# Patient Record
Sex: Female | Born: 1945
Health system: Southern US, Community
[De-identification: ages and names within clinical notes are randomized; demographics above are authoritative.]

## PROBLEM LIST (undated history)

## (undated) DIAGNOSIS — N281 Cyst of kidney, acquired: Secondary | ICD-10-CM

## (undated) DIAGNOSIS — I10 Essential (primary) hypertension: Secondary | ICD-10-CM

## (undated) DIAGNOSIS — E785 Hyperlipidemia, unspecified: Secondary | ICD-10-CM

## (undated) DIAGNOSIS — K5730 Diverticulosis of large intestine without perforation or abscess without bleeding: Secondary | ICD-10-CM

## (undated) DIAGNOSIS — D179 Benign lipomatous neoplasm, unspecified: Secondary | ICD-10-CM

## (undated) DIAGNOSIS — K219 Gastro-esophageal reflux disease without esophagitis: Secondary | ICD-10-CM

## (undated) DIAGNOSIS — A6000 Herpesviral infection of urogenital system, unspecified: Secondary | ICD-10-CM

## (undated) DIAGNOSIS — E039 Hypothyroidism, unspecified: Secondary | ICD-10-CM

## (undated) DIAGNOSIS — F411 Generalized anxiety disorder: Secondary | ICD-10-CM

## (undated) DIAGNOSIS — E119 Type 2 diabetes mellitus without complications: Secondary | ICD-10-CM

## (undated) DIAGNOSIS — J45909 Unspecified asthma, uncomplicated: Secondary | ICD-10-CM

## (undated) DIAGNOSIS — R609 Edema, unspecified: Secondary | ICD-10-CM

## (undated) DIAGNOSIS — D509 Iron deficiency anemia, unspecified: Secondary | ICD-10-CM

## (undated) HISTORY — DX: Edema, unspecified: R60.9

## (undated) HISTORY — DX: Gastro-esophageal reflux disease without esophagitis: K21.9

## (undated) HISTORY — PX: TUBAL LIGATION: SHX77

## (undated) HISTORY — DX: Herpesviral infection of urogenital system, unspecified: A60.00

## (undated) HISTORY — DX: Hyperlipidemia, unspecified: E78.5

## (undated) HISTORY — DX: Iron deficiency anemia, unspecified: D50.9

## (undated) HISTORY — DX: Essential (primary) hypertension: I10

## (undated) HISTORY — PX: SHOULDER SURGERY: SHX246

## (undated) HISTORY — PX: ABDOMINAL HYSTERECTOMY: SHX81

## (undated) HISTORY — PX: TONSILLECTOMY: SUR1361

## (undated) HISTORY — DX: Unspecified asthma, uncomplicated: J45.909

## (undated) HISTORY — DX: Type 2 diabetes mellitus without complications: E11.9

## (undated) HISTORY — DX: Diverticulosis of large intestine without perforation or abscess without bleeding: K57.30

## (undated) HISTORY — PX: OTHER SURGICAL HISTORY: SHX169

## (undated) HISTORY — DX: Generalized anxiety disorder: F41.1

## (undated) HISTORY — DX: Hypothyroidism, unspecified: E03.9

## (undated) HISTORY — DX: Cyst of kidney, acquired: N28.1

## (undated) HISTORY — DX: Benign lipomatous neoplasm, unspecified: D17.9

---

## 1999-08-28 ENCOUNTER — Ambulatory Visit (HOSPITAL_COMMUNITY): Admission: RE | Admit: 1999-08-28 | Discharge: 1999-08-28 | Payer: Self-pay | Admitting: Internal Medicine

## 2000-09-23 ENCOUNTER — Ambulatory Visit (HOSPITAL_COMMUNITY): Admission: RE | Admit: 2000-09-23 | Discharge: 2000-09-23 | Payer: Self-pay | Admitting: Family Medicine

## 2001-11-14 ENCOUNTER — Ambulatory Visit (HOSPITAL_COMMUNITY): Admission: RE | Admit: 2001-11-14 | Discharge: 2001-11-14 | Payer: Self-pay | Admitting: Family Medicine

## 2002-09-14 ENCOUNTER — Encounter: Payer: Self-pay | Admitting: Family Medicine

## 2002-09-14 ENCOUNTER — Ambulatory Visit (HOSPITAL_COMMUNITY): Admission: RE | Admit: 2002-09-14 | Discharge: 2002-09-14 | Payer: Self-pay | Admitting: Family Medicine

## 2002-12-02 ENCOUNTER — Emergency Department (HOSPITAL_COMMUNITY): Admission: EM | Admit: 2002-12-02 | Discharge: 2002-12-02 | Payer: Self-pay

## 2003-02-08 ENCOUNTER — Ambulatory Visit (HOSPITAL_COMMUNITY): Admission: RE | Admit: 2003-02-08 | Discharge: 2003-02-08 | Payer: Self-pay | Admitting: Internal Medicine

## 2003-11-02 ENCOUNTER — Ambulatory Visit (HOSPITAL_COMMUNITY): Admission: RE | Admit: 2003-11-02 | Discharge: 2003-11-02 | Payer: Self-pay | Admitting: Internal Medicine

## 2003-11-23 ENCOUNTER — Ambulatory Visit: Payer: Self-pay | Admitting: *Deleted

## 2003-12-28 ENCOUNTER — Ambulatory Visit: Payer: Self-pay | Admitting: Internal Medicine

## 2004-06-06 ENCOUNTER — Ambulatory Visit: Payer: Self-pay | Admitting: Nurse Practitioner

## 2004-06-12 ENCOUNTER — Ambulatory Visit: Payer: Self-pay | Admitting: Nurse Practitioner

## 2004-06-29 ENCOUNTER — Ambulatory Visit: Payer: Self-pay | Admitting: Nurse Practitioner

## 2004-07-11 ENCOUNTER — Ambulatory Visit: Payer: Self-pay | Admitting: Nurse Practitioner

## 2004-07-25 ENCOUNTER — Ambulatory Visit: Payer: Self-pay | Admitting: Nurse Practitioner

## 2004-08-08 ENCOUNTER — Ambulatory Visit: Payer: Self-pay | Admitting: Nurse Practitioner

## 2004-08-14 ENCOUNTER — Ambulatory Visit (HOSPITAL_COMMUNITY): Admission: RE | Admit: 2004-08-14 | Discharge: 2004-08-14 | Payer: Self-pay | Admitting: Internal Medicine

## 2004-09-21 ENCOUNTER — Encounter (INDEPENDENT_AMBULATORY_CARE_PROVIDER_SITE_OTHER): Payer: Self-pay | Admitting: *Deleted

## 2004-09-21 ENCOUNTER — Ambulatory Visit (HOSPITAL_COMMUNITY): Admission: RE | Admit: 2004-09-21 | Discharge: 2004-09-21 | Payer: Self-pay | Admitting: Internal Medicine

## 2004-11-27 ENCOUNTER — Ambulatory Visit: Payer: Self-pay | Admitting: Nurse Practitioner

## 2004-12-04 ENCOUNTER — Ambulatory Visit (HOSPITAL_COMMUNITY): Admission: RE | Admit: 2004-12-04 | Discharge: 2004-12-04 | Payer: Self-pay | Admitting: Internal Medicine

## 2004-12-11 ENCOUNTER — Ambulatory Visit: Payer: Self-pay | Admitting: Nurse Practitioner

## 2005-05-23 ENCOUNTER — Ambulatory Visit: Payer: Self-pay | Admitting: Nurse Practitioner

## 2005-06-05 ENCOUNTER — Emergency Department (HOSPITAL_COMMUNITY): Admission: EM | Admit: 2005-06-05 | Discharge: 2005-06-05 | Payer: Self-pay | Admitting: Family Medicine

## 2005-06-11 ENCOUNTER — Ambulatory Visit: Payer: Self-pay | Admitting: Nurse Practitioner

## 2005-08-08 ENCOUNTER — Ambulatory Visit: Payer: Self-pay | Admitting: Nurse Practitioner

## 2005-09-19 ENCOUNTER — Ambulatory Visit (HOSPITAL_COMMUNITY): Admission: RE | Admit: 2005-09-19 | Discharge: 2005-09-19 | Payer: Self-pay | Admitting: Nurse Practitioner

## 2005-09-19 ENCOUNTER — Ambulatory Visit: Payer: Self-pay | Admitting: Nurse Practitioner

## 2005-10-23 ENCOUNTER — Ambulatory Visit: Payer: Self-pay | Admitting: Nurse Practitioner

## 2006-03-19 ENCOUNTER — Emergency Department (HOSPITAL_COMMUNITY): Admission: EM | Admit: 2006-03-19 | Discharge: 2006-03-19 | Payer: Self-pay | Admitting: Emergency Medicine

## 2006-03-21 ENCOUNTER — Inpatient Hospital Stay (HOSPITAL_COMMUNITY): Admission: RE | Admit: 2006-03-21 | Discharge: 2006-03-23 | Payer: Self-pay | Admitting: Orthopedic Surgery

## 2006-04-17 ENCOUNTER — Encounter: Admission: RE | Admit: 2006-04-17 | Discharge: 2006-04-17 | Payer: Self-pay | Admitting: Orthopedic Surgery

## 2006-04-30 ENCOUNTER — Ambulatory Visit (HOSPITAL_COMMUNITY): Admission: RE | Admit: 2006-04-30 | Discharge: 2006-04-30 | Payer: Self-pay | Admitting: Orthopedic Surgery

## 2006-05-27 ENCOUNTER — Encounter: Admission: RE | Admit: 2006-05-27 | Discharge: 2006-05-27 | Payer: Self-pay | Admitting: Orthopedic Surgery

## 2006-06-11 ENCOUNTER — Ambulatory Visit: Payer: Self-pay | Admitting: Nurse Practitioner

## 2006-07-03 ENCOUNTER — Encounter: Admission: RE | Admit: 2006-07-03 | Discharge: 2006-09-19 | Payer: Self-pay | Admitting: Orthopedic Surgery

## 2006-09-17 DIAGNOSIS — E119 Type 2 diabetes mellitus without complications: Secondary | ICD-10-CM

## 2006-09-17 DIAGNOSIS — E039 Hypothyroidism, unspecified: Secondary | ICD-10-CM | POA: Insufficient documentation

## 2006-09-17 DIAGNOSIS — I10 Essential (primary) hypertension: Secondary | ICD-10-CM

## 2006-09-17 HISTORY — DX: Essential (primary) hypertension: I10

## 2006-09-17 HISTORY — DX: Hypothyroidism, unspecified: E03.9

## 2006-09-17 HISTORY — DX: Type 2 diabetes mellitus without complications: E11.9

## 2006-09-18 ENCOUNTER — Encounter: Admission: RE | Admit: 2006-09-18 | Discharge: 2006-09-18 | Payer: Self-pay | Admitting: Orthopedic Surgery

## 2006-10-13 ENCOUNTER — Emergency Department (HOSPITAL_COMMUNITY): Admission: EM | Admit: 2006-10-13 | Discharge: 2006-10-13 | Payer: Self-pay | Admitting: Family Medicine

## 2006-11-14 ENCOUNTER — Encounter: Admission: RE | Admit: 2006-11-14 | Discharge: 2006-11-14 | Payer: Self-pay | Admitting: Orthopedic Surgery

## 2006-12-04 ENCOUNTER — Encounter: Admission: RE | Admit: 2006-12-04 | Discharge: 2006-12-04 | Payer: Self-pay | Admitting: Orthopedic Surgery

## 2006-12-12 ENCOUNTER — Emergency Department (HOSPITAL_COMMUNITY): Admission: EM | Admit: 2006-12-12 | Discharge: 2006-12-12 | Payer: Self-pay | Admitting: Emergency Medicine

## 2007-01-08 ENCOUNTER — Ambulatory Visit: Payer: Self-pay | Admitting: Nurse Practitioner

## 2007-01-08 LAB — CONVERTED CEMR LAB
ALT: 12 units/L (ref 0–35)
AST: 16 units/L (ref 0–37)
Albumin: 4.5 g/dL (ref 3.5–5.2)
Alkaline Phosphatase: 146 units/L — ABNORMAL HIGH (ref 39–117)
BUN: 18 mg/dL (ref 6–23)
Basophils Absolute: 0 10*3/uL (ref 0.0–0.1)
Basophils Relative: 0 % (ref 0–1)
CO2: 26 meq/L (ref 19–32)
Calcium: 9.5 mg/dL (ref 8.4–10.5)
Chloride: 101 meq/L (ref 96–112)
Cholesterol: 197 mg/dL (ref 0–200)
Creatinine, Ser: 0.76 mg/dL (ref 0.40–1.20)
Eosinophils Absolute: 0.2 10*3/uL (ref 0.2–0.7)
Eosinophils Relative: 4 % (ref 0–5)
Glucose, Bld: 65 mg/dL — ABNORMAL LOW (ref 70–99)
HCT: 41.2 % (ref 36.0–46.0)
HDL: 63 mg/dL (ref >39)
Hemoglobin: 13.2 g/dL (ref 12.0–15.0)
LDL Cholesterol: 110 mg/dL — ABNORMAL HIGH (ref 0–99)
Lymphocytes Relative: 46 % (ref 12–46)
Lymphs Abs: 3.2 10*3/uL (ref 0.7–4.0)
MCHC: 32 g/dL (ref 30.0–36.0)
MCV: 92.4 fL (ref 78.0–100.0)
Monocytes Absolute: 0.7 10*3/uL (ref 0.1–1.0)
Monocytes Relative: 10 % (ref 3–12)
Neutro Abs: 2.8 10*3/uL (ref 1.7–7.7)
Neutrophils Relative %: 41 % — ABNORMAL LOW (ref 43–77)
Platelets: 294 10*3/uL (ref 150–400)
Potassium: 3.6 meq/L (ref 3.5–5.3)
RBC: 4.46 M/uL (ref 3.87–5.11)
RDW: 14.1 % (ref 11.5–15.5)
Sodium: 140 meq/L (ref 135–145)
TSH: 1.779 microintl units/mL (ref 0.350–5.50)
Total Bilirubin: 0.5 mg/dL (ref 0.3–1.2)
Total CHOL/HDL Ratio: 3.1
Total Protein: 7.2 g/dL (ref 6.0–8.3)
Triglycerides: 120 mg/dL (ref <150)
VLDL: 24 mg/dL (ref 0–40)
WBC: 6.9 10*3/uL (ref 4.0–10.5)

## 2007-02-05 ENCOUNTER — Ambulatory Visit (HOSPITAL_COMMUNITY): Admission: RE | Admit: 2007-02-05 | Discharge: 2007-02-05 | Payer: Self-pay | Admitting: Orthopedic Surgery

## 2007-06-03 ENCOUNTER — Encounter (INDEPENDENT_AMBULATORY_CARE_PROVIDER_SITE_OTHER): Payer: Self-pay | Admitting: Nurse Practitioner

## 2007-06-03 ENCOUNTER — Ambulatory Visit: Payer: Self-pay | Admitting: Family Medicine

## 2007-06-03 LAB — CONVERTED CEMR LAB
ALT: 13 units/L (ref 0–35)
AST: 19 units/L (ref 0–37)
Albumin: 4.4 g/dL (ref 3.5–5.2)
Alkaline Phosphatase: 141 units/L — ABNORMAL HIGH (ref 39–117)
BUN: 15 mg/dL (ref 6–23)
Basophils Absolute: 0 10*3/uL (ref 0.0–0.1)
Basophils Relative: 1 % (ref 0–1)
CO2: 26 meq/L (ref 19–32)
Calcium: 9.5 mg/dL (ref 8.4–10.5)
Chloride: 103 meq/L (ref 96–112)
Cholesterol: 182 mg/dL (ref 0–200)
Creatinine, Ser: 0.84 mg/dL (ref 0.40–1.20)
Eosinophils Absolute: 0.2 10*3/uL (ref 0.0–0.7)
Eosinophils Relative: 3 % (ref 0–5)
Glucose, Bld: 114 mg/dL — ABNORMAL HIGH (ref 70–99)
HCT: 39.1 % (ref 36.0–46.0)
HCV Ab: NEGATIVE
HDL: 57 mg/dL (ref >39)
Hemoglobin: 13 g/dL (ref 12.0–15.0)
Hep A Total Ab: POSITIVE — AB
Hep B Core Total Ab: NEGATIVE
Hep B E Ab: NEGATIVE
Hep B S Ab: NEGATIVE
LDL Cholesterol: 109 mg/dL — ABNORMAL HIGH (ref 0–99)
Lymphocytes Relative: 42 % (ref 12–46)
Lymphs Abs: 2.5 10*3/uL (ref 0.7–4.0)
MCHC: 33.2 g/dL (ref 30.0–36.0)
MCV: 91.4 fL (ref 78.0–100.0)
Monocytes Absolute: 0.5 10*3/uL (ref 0.1–1.0)
Monocytes Relative: 8 % (ref 3–12)
Neutro Abs: 2.9 10*3/uL (ref 1.7–7.7)
Neutrophils Relative %: 47 % (ref 43–77)
Platelets: 301 10*3/uL (ref 150–400)
Potassium: 3.2 meq/L — ABNORMAL LOW (ref 3.5–5.3)
RBC: 4.28 M/uL (ref 3.87–5.11)
RDW: 14 % (ref 11.5–15.5)
Sodium: 144 meq/L (ref 135–145)
TSH: 1.335 microintl units/mL (ref 0.350–5.50)
Total Bilirubin: 0.4 mg/dL (ref 0.3–1.2)
Total CHOL/HDL Ratio: 3.2
Total Protein: 7.1 g/dL (ref 6.0–8.3)
Triglycerides: 82 mg/dL (ref <150)
VLDL: 16 mg/dL (ref 0–40)
WBC: 6.1 10*3/uL (ref 4.0–10.5)

## 2007-06-18 ENCOUNTER — Encounter (INDEPENDENT_AMBULATORY_CARE_PROVIDER_SITE_OTHER): Payer: Self-pay | Admitting: Nurse Practitioner

## 2007-06-18 ENCOUNTER — Ambulatory Visit: Payer: Self-pay | Admitting: Internal Medicine

## 2007-06-18 LAB — CONVERTED CEMR LAB: Potassium: 3.7 meq/L (ref 3.5–5.3)

## 2007-07-12 ENCOUNTER — Emergency Department (HOSPITAL_COMMUNITY): Admission: EM | Admit: 2007-07-12 | Discharge: 2007-07-12 | Payer: Self-pay | Admitting: Family Medicine

## 2007-12-24 ENCOUNTER — Emergency Department (HOSPITAL_COMMUNITY): Admission: EM | Admit: 2007-12-24 | Discharge: 2007-12-24 | Payer: Self-pay | Admitting: Family Medicine

## 2008-01-12 ENCOUNTER — Encounter (INDEPENDENT_AMBULATORY_CARE_PROVIDER_SITE_OTHER): Payer: Self-pay | Admitting: Internal Medicine

## 2008-01-12 ENCOUNTER — Ambulatory Visit: Payer: Self-pay | Admitting: Internal Medicine

## 2008-01-12 LAB — CONVERTED CEMR LAB
ALT: 17 units/L (ref 0–35)
AST: 18 units/L (ref 0–37)
Albumin: 4.5 g/dL (ref 3.5–5.2)
Alkaline Phosphatase: 137 units/L — ABNORMAL HIGH (ref 39–117)
BUN: 15 mg/dL (ref 6–23)
Basophils Absolute: 0 10*3/uL (ref 0.0–0.1)
Basophils Relative: 0 % (ref 0–1)
CO2: 27 meq/L (ref 19–32)
Calcium: 9 mg/dL (ref 8.4–10.5)
Chloride: 104 meq/L (ref 96–112)
Creatinine, Ser: 0.76 mg/dL (ref 0.40–1.20)
Eosinophils Absolute: 0.3 10*3/uL (ref 0.0–0.7)
Eosinophils Relative: 4 % (ref 0–5)
Glucose, Bld: 83 mg/dL (ref 70–99)
HCT: 39.1 % (ref 36.0–46.0)
Hemoglobin: 13 g/dL (ref 12.0–15.0)
Lymphocytes Relative: 49 % — ABNORMAL HIGH (ref 12–46)
Lymphs Abs: 3.4 10*3/uL (ref 0.7–4.0)
MCHC: 33.2 g/dL (ref 30.0–36.0)
MCV: 89.5 fL (ref 78.0–100.0)
Microalb, Ur: 0.48 mg/dL (ref 0.00–1.89)
Monocytes Absolute: 0.5 10*3/uL (ref 0.1–1.0)
Monocytes Relative: 7 % (ref 3–12)
Neutro Abs: 2.7 10*3/uL (ref 1.7–7.7)
Neutrophils Relative %: 39 % — ABNORMAL LOW (ref 43–77)
Platelets: 305 10*3/uL (ref 150–400)
Potassium: 3.7 meq/L (ref 3.5–5.3)
RBC: 4.37 M/uL (ref 3.87–5.11)
RDW: 14 % (ref 11.5–15.5)
Sodium: 143 meq/L (ref 135–145)
Total Bilirubin: 0.4 mg/dL (ref 0.3–1.2)
Total Protein: 7.2 g/dL (ref 6.0–8.3)
WBC: 6.9 10*3/uL (ref 4.0–10.5)

## 2008-01-13 ENCOUNTER — Ambulatory Visit: Payer: Self-pay | Admitting: Internal Medicine

## 2008-01-19 ENCOUNTER — Ambulatory Visit: Payer: Self-pay | Admitting: Internal Medicine

## 2008-04-07 ENCOUNTER — Encounter: Admission: RE | Admit: 2008-04-07 | Discharge: 2008-04-07 | Payer: Self-pay | Admitting: Orthopedic Surgery

## 2008-05-19 ENCOUNTER — Emergency Department (HOSPITAL_COMMUNITY): Admission: EM | Admit: 2008-05-19 | Discharge: 2008-05-19 | Payer: Self-pay | Admitting: Family Medicine

## 2008-05-31 ENCOUNTER — Encounter (INDEPENDENT_AMBULATORY_CARE_PROVIDER_SITE_OTHER): Payer: Self-pay | Admitting: Internal Medicine

## 2008-05-31 ENCOUNTER — Ambulatory Visit: Payer: Self-pay | Admitting: Internal Medicine

## 2008-05-31 LAB — CONVERTED CEMR LAB
BUN: 18 mg/dL (ref 6–23)
CO2: 28 meq/L (ref 19–32)
Calcium: 9.4 mg/dL (ref 8.4–10.5)
Chloride: 104 meq/L (ref 96–112)
Creatinine, Ser: 0.91 mg/dL (ref 0.40–1.20)
Glucose, Bld: 125 mg/dL — ABNORMAL HIGH (ref 70–99)
Potassium: 3.3 meq/L — ABNORMAL LOW (ref 3.5–5.3)
Sodium: 144 meq/L (ref 135–145)

## 2008-06-01 ENCOUNTER — Encounter (INDEPENDENT_AMBULATORY_CARE_PROVIDER_SITE_OTHER): Payer: Self-pay | Admitting: Internal Medicine

## 2008-06-03 ENCOUNTER — Ambulatory Visit (HOSPITAL_COMMUNITY): Admission: RE | Admit: 2008-06-03 | Discharge: 2008-06-03 | Payer: Self-pay | Admitting: Internal Medicine

## 2008-06-03 ENCOUNTER — Ambulatory Visit: Payer: Self-pay | Admitting: Internal Medicine

## 2008-06-03 LAB — HM MAMMOGRAPHY: HM Mammogram: NORMAL

## 2008-07-19 ENCOUNTER — Encounter: Admission: RE | Admit: 2008-07-19 | Discharge: 2008-07-19 | Payer: Self-pay | Admitting: Anesthesiology

## 2008-08-31 ENCOUNTER — Ambulatory Visit: Payer: Self-pay | Admitting: Internal Medicine

## 2008-09-02 ENCOUNTER — Emergency Department (HOSPITAL_COMMUNITY): Admission: EM | Admit: 2008-09-02 | Discharge: 2008-09-02 | Payer: Self-pay | Admitting: Emergency Medicine

## 2008-11-03 ENCOUNTER — Encounter: Payer: Self-pay | Admitting: Family Medicine

## 2008-11-03 ENCOUNTER — Ambulatory Visit: Payer: Self-pay | Admitting: Internal Medicine

## 2008-11-03 LAB — CONVERTED CEMR LAB
ALT: 21 units/L (ref 0–35)
AST: 23 units/L (ref 0–37)
Albumin: 4.1 g/dL (ref 3.5–5.2)
Alkaline Phosphatase: 123 units/L — ABNORMAL HIGH (ref 39–117)
BUN: 12 mg/dL (ref 6–23)
CO2: 22 meq/L (ref 19–32)
Calcium: 8.6 mg/dL (ref 8.4–10.5)
Chloride: 109 meq/L (ref 96–112)
Cholesterol: 193 mg/dL (ref 0–200)
Creatinine, Ser: 0.75 mg/dL (ref 0.40–1.20)
Glucose, Bld: 116 mg/dL — ABNORMAL HIGH (ref 70–99)
HDL: 50 mg/dL (ref >39)
LDL Cholesterol: 121 mg/dL — ABNORMAL HIGH (ref 0–99)
Potassium: 3.7 meq/L (ref 3.5–5.3)
Sodium: 144 meq/L (ref 135–145)
Total Bilirubin: 0.4 mg/dL (ref 0.3–1.2)
Total CHOL/HDL Ratio: 3.9
Total Protein: 6.7 g/dL (ref 6.0–8.3)
Triglycerides: 109 mg/dL (ref <150)
VLDL: 22 mg/dL (ref 0–40)

## 2008-11-25 ENCOUNTER — Emergency Department (HOSPITAL_COMMUNITY): Admission: EM | Admit: 2008-11-25 | Discharge: 2008-11-25 | Payer: Self-pay | Admitting: Emergency Medicine

## 2009-01-25 ENCOUNTER — Encounter (INDEPENDENT_AMBULATORY_CARE_PROVIDER_SITE_OTHER): Payer: Self-pay | Admitting: Internal Medicine

## 2009-01-25 ENCOUNTER — Ambulatory Visit: Payer: Self-pay | Admitting: Internal Medicine

## 2009-01-25 LAB — CONVERTED CEMR LAB
ALT: 16 units/L (ref 0–35)
AST: 16 units/L (ref 0–37)
Albumin: 4.2 g/dL (ref 3.5–5.2)
Alkaline Phosphatase: 123 units/L — ABNORMAL HIGH (ref 39–117)
Bilirubin, Direct: 0.1 mg/dL (ref 0.0–0.3)
Cholesterol: 188 mg/dL (ref 0–200)
HDL: 55 mg/dL (ref >39)
Indirect Bilirubin: 0.4 mg/dL (ref 0.0–0.9)
LDL Cholesterol: 118 mg/dL — ABNORMAL HIGH (ref 0–99)
Total Bilirubin: 0.5 mg/dL (ref 0.3–1.2)
Total CHOL/HDL Ratio: 3.4
Total Protein: 6.4 g/dL (ref 6.0–8.3)
Triglycerides: 76 mg/dL (ref <150)
VLDL: 15 mg/dL (ref 0–40)

## 2009-02-09 ENCOUNTER — Ambulatory Visit: Payer: Self-pay | Admitting: Internal Medicine

## 2009-02-09 ENCOUNTER — Encounter (INDEPENDENT_AMBULATORY_CARE_PROVIDER_SITE_OTHER): Payer: Self-pay | Admitting: Internal Medicine

## 2009-02-09 LAB — CONVERTED CEMR LAB: Hgb A1c MFr Bld: 6.6 % — ABNORMAL HIGH (ref 4.6–6.1)

## 2009-02-16 ENCOUNTER — Encounter (INDEPENDENT_AMBULATORY_CARE_PROVIDER_SITE_OTHER): Payer: Self-pay | Admitting: *Deleted

## 2009-02-26 HISTORY — PX: COLONOSCOPY: SHX174

## 2009-03-22 ENCOUNTER — Ambulatory Visit: Payer: Self-pay | Admitting: Internal Medicine

## 2009-03-22 LAB — CONVERTED CEMR LAB
ALT: 14 units/L (ref 0–35)
AST: 14 units/L (ref 0–37)
Albumin: 4.4 g/dL (ref 3.5–5.2)
Alkaline Phosphatase: 126 units/L — ABNORMAL HIGH (ref 39–117)
Bilirubin, Direct: 0.1 mg/dL (ref 0.0–0.3)
Cholesterol: 192 mg/dL (ref 0–200)
HDL: 63 mg/dL (ref >39)
Indirect Bilirubin: 0.4 mg/dL (ref 0.0–0.9)
LDL Cholesterol: 115 mg/dL — ABNORMAL HIGH (ref 0–99)
Total Bilirubin: 0.5 mg/dL (ref 0.3–1.2)
Total CHOL/HDL Ratio: 3
Total Protein: 6.8 g/dL (ref 6.0–8.3)
Triglycerides: 69 mg/dL (ref <150)
VLDL: 14 mg/dL (ref 0–40)

## 2009-05-20 ENCOUNTER — Emergency Department (HOSPITAL_COMMUNITY): Admission: EM | Admit: 2009-05-20 | Discharge: 2009-05-20 | Payer: Self-pay | Admitting: Family Medicine

## 2009-12-06 ENCOUNTER — Ambulatory Visit: Payer: Self-pay | Admitting: Internal Medicine

## 2009-12-06 DIAGNOSIS — H109 Unspecified conjunctivitis: Secondary | ICD-10-CM | POA: Insufficient documentation

## 2009-12-06 DIAGNOSIS — D509 Iron deficiency anemia, unspecified: Secondary | ICD-10-CM

## 2009-12-06 DIAGNOSIS — K5730 Diverticulosis of large intestine without perforation or abscess without bleeding: Secondary | ICD-10-CM

## 2009-12-06 DIAGNOSIS — D179 Benign lipomatous neoplasm, unspecified: Secondary | ICD-10-CM

## 2009-12-06 HISTORY — DX: Diverticulosis of large intestine without perforation or abscess without bleeding: K57.30

## 2009-12-06 HISTORY — DX: Unspecified conjunctivitis: H10.9

## 2009-12-06 HISTORY — DX: Iron deficiency anemia, unspecified: D50.9

## 2009-12-06 HISTORY — DX: Benign lipomatous neoplasm, unspecified: D17.9

## 2009-12-07 LAB — CONVERTED CEMR LAB
ALT: 23 units/L (ref 0–35)
AST: 20 units/L (ref 0–37)
Albumin: 4 g/dL (ref 3.5–5.2)
Alkaline Phosphatase: 135 units/L — ABNORMAL HIGH (ref 39–117)
BUN: 12 mg/dL (ref 6–23)
Basophils Absolute: 0 10*3/uL (ref 0.0–0.1)
Basophils Relative: 0.8 % (ref 0.0–3.0)
Bilirubin Urine: NEGATIVE
Bilirubin, Direct: 0.1 mg/dL (ref 0.0–0.3)
CO2: 30 meq/L (ref 19–32)
Calcium: 9.2 mg/dL (ref 8.4–10.5)
Chlamydia, Swab/Urine, PCR: NEGATIVE
Chloride: 106 meq/L (ref 96–112)
Cholesterol: 179 mg/dL (ref 0–200)
Creatinine, Ser: 0.7 mg/dL (ref 0.4–1.2)
Creatinine,U: 46.5 mg/dL
Eosinophils Absolute: 0.2 10*3/uL (ref 0.0–0.7)
Eosinophils Relative: 3.4 % (ref 0.0–5.0)
GC Probe Amp, Urine: NEGATIVE
GFR calc non Af Amer: 111.69 mL/min (ref >60)
Glucose, Bld: 107 mg/dL — ABNORMAL HIGH (ref 70–99)
HCT: 41.9 % (ref 36.0–46.0)
HDL: 47.2 mg/dL (ref >39.00)
Hemoglobin, Urine: NEGATIVE
Hemoglobin: 14.3 g/dL (ref 12.0–15.0)
Hgb A1c MFr Bld: 7.6 % — ABNORMAL HIGH (ref 4.6–6.5)
Ketones, ur: NEGATIVE mg/dL
LDL Cholesterol: 117 mg/dL — ABNORMAL HIGH (ref 0–99)
Leukocytes, UA: NEGATIVE
Lymphocytes Relative: 44.1 % (ref 12.0–46.0)
Lymphs Abs: 2.4 10*3/uL (ref 0.7–4.0)
MCHC: 34.1 g/dL (ref 30.0–36.0)
MCV: 90.2 fL (ref 78.0–100.0)
Microalb Creat Ratio: 2.8 mg/g (ref 0.0–30.0)
Microalb, Ur: 1.3 mg/dL (ref 0.0–1.9)
Monocytes Absolute: 0.3 10*3/uL (ref 0.1–1.0)
Monocytes Relative: 6.4 % (ref 3.0–12.0)
Neutro Abs: 2.4 10*3/uL (ref 1.4–7.7)
Neutrophils Relative %: 45.3 % (ref 43.0–77.0)
Nitrite: NEGATIVE
Platelets: 265 10*3/uL (ref 150.0–400.0)
Potassium: 4 meq/L (ref 3.5–5.1)
RBC: 4.65 M/uL (ref 3.87–5.11)
RDW: 14.5 % (ref 11.5–14.6)
Sodium: 142 meq/L (ref 135–145)
Specific Gravity, Urine: 1.015 (ref 1.000–1.030)
TSH: 1.2 microintl units/mL (ref 0.35–5.50)
Total Bilirubin: 0.7 mg/dL (ref 0.3–1.2)
Total CHOL/HDL Ratio: 4
Total Protein, Urine: NEGATIVE mg/dL
Total Protein: 6.8 g/dL (ref 6.0–8.3)
Triglycerides: 76 mg/dL (ref 0.0–149.0)
Urine Glucose: NEGATIVE mg/dL
Urobilinogen, UA: 0.2 (ref 0.0–1.0)
VLDL: 15.2 mg/dL (ref 0.0–40.0)
WBC: 5.4 10*3/uL (ref 4.5–10.5)
pH: 7.5 (ref 5.0–8.0)

## 2009-12-12 ENCOUNTER — Encounter (INDEPENDENT_AMBULATORY_CARE_PROVIDER_SITE_OTHER): Payer: Self-pay | Admitting: *Deleted

## 2009-12-20 ENCOUNTER — Encounter: Payer: Self-pay | Admitting: Internal Medicine

## 2009-12-29 ENCOUNTER — Encounter (INDEPENDENT_AMBULATORY_CARE_PROVIDER_SITE_OTHER): Payer: Self-pay | Admitting: *Deleted

## 2010-01-02 ENCOUNTER — Ambulatory Visit: Payer: Self-pay | Admitting: Gastroenterology

## 2010-01-02 ENCOUNTER — Encounter (INDEPENDENT_AMBULATORY_CARE_PROVIDER_SITE_OTHER): Payer: Self-pay | Admitting: *Deleted

## 2010-01-04 ENCOUNTER — Telehealth (INDEPENDENT_AMBULATORY_CARE_PROVIDER_SITE_OTHER): Payer: Self-pay | Admitting: *Deleted

## 2010-01-13 ENCOUNTER — Encounter: Payer: Self-pay | Admitting: Internal Medicine

## 2010-01-16 ENCOUNTER — Ambulatory Visit: Payer: Self-pay | Admitting: Gastroenterology

## 2010-01-16 LAB — HM COLONOSCOPY

## 2010-01-17 ENCOUNTER — Encounter: Payer: Self-pay | Admitting: Gastroenterology

## 2010-01-18 ENCOUNTER — Ambulatory Visit: Payer: Self-pay | Admitting: Internal Medicine

## 2010-02-28 ENCOUNTER — Telehealth: Payer: Self-pay | Admitting: Internal Medicine

## 2010-03-24 ENCOUNTER — Other Ambulatory Visit: Payer: Self-pay | Admitting: Internal Medicine

## 2010-03-24 ENCOUNTER — Ambulatory Visit
Admission: RE | Admit: 2010-03-24 | Discharge: 2010-03-24 | Payer: Self-pay | Source: Home / Self Care | Attending: Internal Medicine | Admitting: Internal Medicine

## 2010-03-24 DIAGNOSIS — E785 Hyperlipidemia, unspecified: Secondary | ICD-10-CM | POA: Insufficient documentation

## 2010-03-24 DIAGNOSIS — R609 Edema, unspecified: Secondary | ICD-10-CM | POA: Insufficient documentation

## 2010-03-24 DIAGNOSIS — A6000 Herpesviral infection of urogenital system, unspecified: Secondary | ICD-10-CM

## 2010-03-24 DIAGNOSIS — R351 Nocturia: Secondary | ICD-10-CM | POA: Insufficient documentation

## 2010-03-24 HISTORY — DX: Edema, unspecified: R60.9

## 2010-03-24 HISTORY — DX: Hyperlipidemia, unspecified: E78.5

## 2010-03-24 HISTORY — DX: Herpesviral infection of urogenital system, unspecified: A60.00

## 2010-03-24 LAB — URINALYSIS, ROUTINE W REFLEX MICROSCOPIC
Bilirubin Urine: NEGATIVE
Hemoglobin, Urine: NEGATIVE
Ketones, ur: NEGATIVE
Leukocytes, UA: NEGATIVE
Nitrite: NEGATIVE
Specific Gravity, Urine: 1.025 (ref 1.000–1.030)
Total Protein, Urine: NEGATIVE
Urine Glucose: NEGATIVE
Urobilinogen, UA: 0.2 (ref 0.0–1.0)
pH: 5.5 (ref 5.0–8.0)

## 2010-03-25 ENCOUNTER — Encounter: Payer: Self-pay | Admitting: Internal Medicine

## 2010-03-28 NOTE — Medication Information (Signed)
Summary: Cancalled/[facility]  Cancalled/[facility]   Imported By: Lester Kress 01/20/2010 10:04:52  _____________________________________________________________________  External Attachment:    Type:   Image     Comment:   External Document

## 2010-03-28 NOTE — Assessment & Plan Note (Signed)
Summary: NEW MEDICARE PT--PKG-#-STC   Vital Signs:  Patient profile:   65 year old female Height:      59 inches Weight:      193.25 pounds BMI:     39.17 O2 Sat:      98 % on Room air Temp:     98.3 degrees F oral Pulse rate:   84 / minute BP sitting:   142 / 90  (left arm) Cuff size:   large  Vitals Entered By: Zella Ball Ewing CMA Duncan Dull) (December 06, 2009 10:05 AM)  O2 Flow:  Room air  Preventive Care Screening  Mammogram:    Date:  06/03/2008    Results:  normal   CC: New Patient, New medicare/RE   CC:  New Patient and New medicare/RE.  History of Present Illness: overall doing well.  Pt denies CP, worsening sob, doe, wheezing, orthopnea, pnd, worsening LE edema, palps, dizziness or syncope  Pt denies new neuro symptoms such as headache, facial or extremity weakness  Pt denies polydipsia, polyuria  Overall good compliance with meds, trying to follow low chol, DM diet, wt stable, little excercise however No fever, wt loss, night sweats, loss of appetite or other constitutional symptoms   Preventive Screening-Counseling & Management  Alcohol-Tobacco     Smoking Status: never      Drug Use:  no.    Problems Prior to Update: 1)  Preventive Health Care  (ICD-V70.0) 2)  Anemia-iron Deficiency  (ICD-280.9) 3)  Conjunctivitis, Bilateral  (ICD-372.30) 4)  Lipoma  (ICD-214.9) 5)  Diverticulosis, Colon  (ICD-562.10) 6)  Sexually Transmitted Disease, Exposure To  (ICD-V01.6) 7)  Hypothyroidism  (ICD-244.9) 8)  Hypertension  (ICD-401.9) 9)  Diabetes Mellitus, Type II  (ICD-250.00)  Medications Prior to Update: 1)  Norvasc 5mg  .... One Po Once Daily 2)  Avapro 300mg  .... One By Mouth Once Daily 3)  Amlodipine 10mg  .... One By Mouth Once Daily 4)  Hctz 25mg  .... One By Mouth Qd 5)  Actos 15mg  .... One By Mouth Qd 6)  Pravastatin 40mg  .... One By Mouth Qd 7)  Omeprazole 40mg  .... One By Mouth Once Daily  Current Medications (verified): 1)  Norvasc 10 Mg Tabs (Amlodipine  Besylate) .Marland Kitchen.. 1po Once Daily   - Daw 2)  Avapro 300mg  .... One By Mouth Once Daily 3)  Hctz 25mg  .... One By Mouth Once Daily 4)  Actos 15mg  .... One By Mouth Once Daily 5)  Pravastatin 40mg  .... One By Mouth Once Daily 6)  Omeprazole 40mg  .... One By Mouth Once Daily 7)  Coq10 100 Mg Caps (Coenzyme Q10) .Marland Kitchen.. 1 By Mouth Once Daily 8)  Tylenol Extra Strength 500 Mg Tabs (Acetaminophen) .Marland Kitchen.. 1 By Mouth Once Daily As Needed 9)  Aleve 220 Mg Tabs (Naproxen Sodium) .Marland Kitchen.. 1 By Mouth Once Daily As Needed 10)  Vitamin C 500 Mg Tabs (Ascorbic Acid) .Marland Kitchen.. 1 By Mouth Once Daily 11)  Vitamin E 400 Unit Caps (Vitamin E) .Marland Kitchen.. 1 By Mouth Once Daily 12)  Vitamin B-12 1000 Mcg Tabs (Cyanocobalamin) .Marland Kitchen.. 1 By Mouth Once Daily 13)  Vitamin D 400 Unit Tabs (Cholecalciferol) .Marland Kitchen.. 1 By Mouth Once Daily 14)  Hydrocodone-Acetaminophen 7.5-750 Mg Tabs (Hydrocodone-Acetaminophen) .Marland Kitchen.. 1 By Mouth Once Daily As Needed For Pain 15)  Tobramycin Sulfate 0.3 % Soln (Tobramycin Sulfate) .... 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 DJD DM brother with prostate  cancer sister with heart disease, stroke, sudden death early 64's parent with HTN  Social History: Reviewed history and no changes required. 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         all otherwise negative per pt -  although is asking for STD check today as well, no genital symptoms  Physical Exam  General:  alert and well-developed.   Head:  normocephalic and atraumatic.   Eyes:  vision grossly intact, pupils equal, and pupils round., bilat conjucnt injected with mild d/c  Ears:  R ear normal and L ear normal.   Nose:  no external deformity and no nasal discharge.   Mouth:  no gingival abnormalities and pharynx pink and moist.   Neck:  supple and no masses.   Lungs:  normal respiratory effort and normal breath sounds.   Heart:  normal rate and regular rhythm.   Abdomen:  soft, non-tender, and normal bowel sounds.  ; RUQ area with nontender large mass approx 5 cm superficial and fatty consistenncy Msk:  no joint tenderness and no joint swelling.   Extremities:  no edema, no erythema  Neurologic:  cranial nerves II-XII intact and strength normal in all extremities.   Skin:  color normal and no rashes.   Psych:  not anxious appearing and not depressed appearing.     Impression & Recommendations:  Problem # 1:  Preventive Health Care (ICD-V70.0)  Overall doing well, age appropriate education and counseling updated and referral  for appropriate preventive services done unless declined, immunizations up to date or declined, diet counseling done if overweight, urged to quit smoking if smokes , most recent labs reviewed and current ordered if appropriate, ecg reviewed or declined (interpretation per ECG scanned in the EMR if done); information regarding Medicare Prevention requirements given if appropriate; speciality referrals updated as appropriate   Orders: Gastroenterology Referral (GI) TLB-BMP (Basic Metabolic Panel-BMET) (80048-METABOL) TLB-CBC Platelet - w/Differential (85025-CBCD) TLB-Hepatic/Liver Function Pnl (80076-HEPATIC) TLB-Lipid Panel (80061-LIPID) TLB-TSH (Thyroid Stimulating Hormone) (84443-TSH) TLB-Udip ONLY (81003-UDIP)  Problem # 2:  SEXUALLY TRANSMITTED DISEASE, EXPOSURE TO (ICD-V01.6)  for std check  per pt request  Orders: T-HIV-1 (Screen) [phone]) T-RPR (Syphilis) [phone]) T-Chlamydia & GC Probe, Urine (87491/[id number]) T-Herpes Simplex Type 2 (91478-29562)  Problem # 3:  LIPOMA (ICD-214.9)  RUQ  - enlarging  - to refer to gen surgury  Orders: Surgical Referral (Surgery)  Problem # 4:  CONJUNCTIVITIS, BILATERAL (ICD-372.30)  for tobramycin optho asd   Her updated medication list for this problem includes:    Tobramycin Sulfate 0.3 % Soln (Tobramycin sulfate) .Marland Kitchen... 2 gttts both eyes four times per day for 10 days  Problem # 5:  HYPERTENSION (ICD-401.9)  did not yet take meds this am - stable overall by hx and exam, ok to continue meds/tx as is   BP today: 142/90  Labs Reviewed: K+: 3.7 (11/03/2008) Creat: : 0.75 (11/03/2008)   Chol: 192 (03/22/2009)   HDL: 63 (03/22/2009)   LDL: 115 (03/22/2009)   TG: 69 (03/22/2009)  Her updated medication list for this problem includes:    Norvasc 10 Mg Tabs (Amlodipine besylate) .Marland Kitchen... 1po once daily   - daw  Problem # 6:  DIABETES MELLITUS, TYPE II (ICD-250.00)  Labs Reviewed: Creat: 0.75 (11/03/2008)    Reviewed HgBA1c  results: 6.6 (02/09/2009) stable overall by hx and exam, ok to continue meds/tx as is ,  Pt to cont DM diet, excercise, wt loss efforts; to check labs today   Orders: TLB-A1C / Hgb A1C (Glycohemoglobin) (83036-A1C) TLB-Microalbumin/Creat Ratio, Urine (82043-MALB)  Her updated medication list for this problem includes:    Actos 30 Mg Tabs (Pioglitazone hcl) .Marland Kitchen... 1po once daily  Complete Medication List: 1)  Norvasc 10 Mg Tabs (Amlodipine besylate) .Marland Kitchen.. 1po once daily   - daw 2)  Avapro 300mg   .... One by mouth once daily 3)  Hctz 25mg   .... One by mouth once daily 4)  Actos 30 Mg Tabs (Pioglitazone hcl) .Marland Kitchen.. 1po once daily 5)  Pravastatin 40mg   .... 2 by mouth once daily 6)  Omeprazole 40mg   .... One by mouth once daily 7)  Coq10 100 Mg Caps (Coenzyme q10) .Marland Kitchen.. 1 by mouth once daily 8)  Tylenol Extra Strength 500 Mg Tabs (Acetaminophen) .Marland Kitchen.. 1 by mouth once daily as needed 9)  Aleve 220 Mg Tabs (Naproxen sodium) .Marland Kitchen.. 1 by mouth once daily as needed 10)  Vitamin C 500 Mg Tabs (Ascorbic acid) .Marland Kitchen.. 1 by mouth once daily 11)  Vitamin E 400 Unit Caps (Vitamin e) .Marland Kitchen.. 1 by mouth once daily 12)  Vitamin B-12 1000 Mcg Tabs (Cyanocobalamin) .Marland Kitchen.. 1 by mouth once daily 13)  Vitamin D 400 Unit Tabs (Cholecalciferol) .Marland Kitchen.. 1 by mouth once daily 14)  Hydrocodone-acetaminophen 7.5-750 Mg Tabs (Hydrocodone-acetaminophen) .Marland Kitchen.. 1 by mouth once daily as needed for pain 15)  Tobramycin Sulfate 0.3 % Soln (Tobramycin sulfate) .... 2 gttts both eyes four times per day for 10 days  Other Orders: Flu Vaccine 31yrs + MEDICARE PATIENTS (Z6109) Administration Flu vaccine - MCR (G0008) Pneumococcal Vaccine (60454) Admin 1st Vaccine (09811)  Patient Instructions: 1)  you had the flu shot or pneumonia shot 2)  You will be contacted about the referral(s) to: colonoscopy, general surgeon 3)  please call for your yearly mammogram 4)  Please go to the Lab in the basement for your blood and/or urine tests today 5)   Please call the number on the [facility] Card for results of your testing 6)  Please take all new medications as prescribed 7)  Continue all previous medications as before this visit  8)  Please schedule a follow-up appointment in 6 months with: 9)  BMP prior to visit, ICD-9: 250.02 10)  Lipid Panel prior to visit, ICD-9: 11)  HbgA1C prior to visit, ICD-9: Prescriptions: HYDROCODONE-ACETAMINOPHEN 7.5-750 MG TABS (HYDROCODONE-ACETAMINOPHEN) 1 by mouth once daily as needed for pain  #30  x 5   Entered and Authorized by:   Corwin Levins MD   Signed by:   Corwin Levins MD on 12/06/2009   Method used:   Print then Give to Patient   RxID:   [id number] OMEPRAZOLE 40MG  one by mouth once daily  #90 x 3   Entered and Authorized by:   Corwin Levins MD   Signed by:   Corwin Levins MD on 12/06/2009   Method used:   Print then Give to Patient   RxID:   [id number] PRAVASTATIN 40MG  one by mouth once daily  #90 x 3   Entered and Authorized by:   Corwin Levins MD   Signed by:   Corwin Levins MD on 12/06/2009   Method used:   Print then Give to Patient   RxID:   [id number] ACTOS 15MG  one by mouth once daily  #90 x 3   Entered and Authorized by:   Corwin Levins MD   Signed by:   Corwin Levins MD on 12/06/2009   Method used:   Print then Give to Patient   RxID:   [id number] HCTZ 25MG  one by mouth once daily  #90 x 3   Entered and Authorized by:   Corwin Levins MD   Signed by:   Corwin Levins MD on 12/06/2009   Method used:   Print then Give to Patient   RxID:   [id number] AVAPRO 300MG  one by mouth once daily  #90 x 3   Entered and Authorized by:   Corwin Levins MD   Signed by:   Corwin Levins MD on 12/06/2009   Method used:   Print then Give to Patient   RxID:   [id number] NORVASC 10 MG TABS (AMLODIPINE BESYLATE) 1po once daily   - DAW Brand medically necessary #90 x 3   Entered and Authorized by:   Corwin Levins MD   Signed by:   Corwin Levins MD on 12/06/2009   Method used:    Print then Give to Patient   RxID:   [id number] TOBRAMYCIN SULFATE 0.3 % SOLN (TOBRAMYCIN SULFATE) 2 gttts both eyes four times per day for 10 days  #1 x 0   Entered and Authorized by:   Corwin Levins MD   Signed by:   Corwin Levins MD on 12/06/2009   Method used:   Print then Give to Patient   RxID:   [id number]   Flu Vaccine Consent Questions     Do you have a history of severe allergic reactions to this vaccine? no    Any prior history of allergic reactions to egg and/or gelatin? no    Do you have a sensitivity to the preservative Thimersol? no    Do you have a past history of Guillan-Barre Syndrome? no    Do you currently have an acute febrile illness? no    Have you ever had a severe reaction to latex? no    Vaccine information given and explained to patient? yes    Are you currently pregnant? no    Lot Number:[id number]   Exp Date:08/26/2010   Site Given  Left Deltoid IMflu1   Immunizations Administered:  Pneumonia Vaccine:    Vaccine Type: Pneumovax    Site: right deltoid    Mfr: Merck    Dose: 0.5 ml    Route: IM    Given by: Zella Ball Ewing CMA (AAMA)    Exp. Date:  05/11/2011    Lot #: [id number]    VIS given: 01/31/09 version given December 06, 2009.

## 2010-03-28 NOTE — Letter (Signed)
Summary: *HSN Results Follow up  [facility]  [address]   [place], [place] [address]   Phone: [phone]  Fax: [phone]      02/16/2009   [facility] [address] [place], [place]  [address]   Dear  Ms. Amberley Derderian,                            ____S.Drinkard,FNP   ____D. Gore,FNP       ____B. McPherson,MD   ____V. Rankins,MD    ____E. Mulberry,MD    ____N. Daphine Deutscher, FNP  ____D. Reche Dixon, MD    ____K. Philipp Deputy, MD    _X___Other Madaline Savage Id-Din, FNP     This letter is to inform you that your recent test(s):  _______Pap Smear    ___X____Lab Test     _______X-ray    ____X___ is within acceptable limits  _______ requires a medication change  _______ requires a follow-up lab visit  _______ requires a follow-up visit with your provider   Comments: Your hemoglobin A1C was at goal of 6.6. Continue yuor diabetic medications as prescribed. Keep up the good work.    _________________________________________________________ If you have any questions, please contact our office                     Sincerely,  Blondell Reveal RN [address]

## 2010-03-28 NOTE — Procedures (Signed)
Summary: Colonoscopy  Patient: Brandi Shaffer Note: All result statuses are Final unless otherwise noted.  Tests: (1) Colonoscopy (COL)   COL Colonoscopy           DONE     [facility]     [address]     [place], [place]  [address]           COLONOSCOPY PROCEDURE REPORT           PATIENT:  Brandi, Shaffer  MR#:  [id number]     BIRTHDATE:  [birth year], 64 yrs. old  GENDER:  female     ENDOSCOPIST:  Judie Petit T. Russella Dar, MD, [facility]     Referred by:  Oliver Barre, M.D.     PROCEDURE DATE:  01/16/2010     PROCEDURE:  Colonoscopy with biopsy     ASA CLASS:  Class II     INDICATIONS:  1) Routine Risk Screening     MEDICATIONS:   Fentanyl 75 mcg IV, Versed 6 mg IV     DESCRIPTION OF PROCEDURE:   After the risks benefits and     alternatives of the procedure were thoroughly explained, informed     consent was obtained.  Digital rectal exam was performed and     revealed no abnormalities.   The LB [id number] endoscope     was introduced through the anus and advanced to the cecum, which     was identified by both the appendix and ileocecal valve, without     limitations.  The quality of the prep was excellent, using     MoviPrep.  The instrument was then slowly withdrawn as the colon     was fully examined.     <<PROCEDUREIMAGES>>     FINDINGS:  Mild diverticulosis was found in the sigmoid to     transverse colon.  A sessile polyp was found in the ascending     colon. It was 3 mm in size. The polyp was removed using cold     biopsy forceps.  A sessile polyp was found in the descending     colon. It was 5 mm in size. The polyp was removed using cold     biopsy forceps. This was otherwise a normal examination of the     colon. Retroflexed views in the rectum revealed no abnormalities.     The time to cecum =  2.5  minutes. The scope was then withdrawn     (time =  9.5  min) from the patient and the procedure completed.           COMPLICATIONS:  None           ENDOSCOPIC  IMPRESSION:     1) Mild diverticulosis in the sigmoid to transverse colon     2) 3 mm sessile polyp in the ascending colon     3) 5 mm sessile polyp in the descending colon           RECOMMENDATIONS:     1) Await pathology results     2) High fiber diet with liberal fluid intake.     3) If the polyps removed are adenomatous (pre-cancerous),     colonoscopy in 5 years. Otherwise continue to follow colorectal     cancer screening guidelines for "routine risk" patients with     colonoscopy in 10 years.           Venita Lick. Russella Dar, MD, Clementeen Graham  n.     eSIGNED:   Malcolm T. Stark at 01/16/2010 11:45 AM           Jennye Boroughs, [id number]  Note: An exclamation mark (!) indicates a result that was not dispersed into the flowsheet. Document Creation Date: 01/16/2010 11:45 AM _______________________________________________________________________  (1) Order result status: Final Collection or observation date-time: 01/16/2010 11:41 Requested date-time:  Receipt date-time:  Reported date-time:  Referring Physician:   Ordering Physician: Claudette Head [phone]) Specimen Source:  Source: Launa Grill Order Number: [id number] Lab site:   Appended Document: Colonoscopy     Procedures Next Due Date:    Colonoscopy: 12/2019

## 2010-03-28 NOTE — Letter (Signed)
Summary: Diabetic Instructions  [facility]  [address] [place], [place] [address]   Phone: [phone]  Fax: [phone]    Brandi Shaffer July 29, 1945 MRN: [id number]   _ x _   ORAL DIABETIC MEDICATION INSTRUCTIONS  The day before your procedure:   Take your diabetic pill as you do normally  The day of your procedure:   Do not take your diabetic pill    We will check your blood sugar levels during the admission process and again in Recovery before discharging you home  _

## 2010-03-28 NOTE — Miscellaneous (Signed)
Summary: [facility] Previsit/prep  Clinical Lists Changes  Medications: Added new medication of MOVIPREP 100 GM  SOLR (PEG-KCL-NACL-NASULF-NA ASC-C) As per prep instructions. - Signed Rx of MOVIPREP 100 GM  SOLR (PEG-KCL-NACL-NASULF-NA ASC-C) As per prep instructions.;  #1 x 0;  Signed;  Entered by: Wyona Almas RN;  Authorized by: Meryl Dare MD [facility];  Method used: [facility] to [facility]  [address] [phone]*, [address] [place], [place], [place]  [address], Ph: [phone] or [phone], Fax: [phone] Observations: Added new observation of NKA: T (01/02/2010 12:44)    Prescriptions: MOVIPREP 100 GM  SOLR (PEG-KCL-NACL-NASULF-NA ASC-C) As per prep instructions.  #1 x 0   Entered by:   Wyona Almas RN   Authorized by:   Meryl Dare MD [facility]   Signed by:   Wyona Almas RN on 01/02/2010   Method used:   [facility]        [facility] [address] [phone]* (retail)       [address]       [place], [place]  [address]       Ph: [phone] or [phone]       Fax: [phone]   RxID:   [id number]

## 2010-03-28 NOTE — Progress Notes (Signed)
Summary: Supporting lab dx code.  ---- Converted from flag ---- ---- 01/03/2010 7:33 PM, Corwin Levins MD wrote: please try 236.3  ---- 12/28/2009 2:06 PM, Leodis Liverpool wrote: Need diagnosis codes for the Chlam, Rpr, HIV, GC, HSV from [facility] 12/06/09.  Loney Loh was denied payment for the V01.6.  Thanks Darl Pikes. ------------------------------

## 2010-03-28 NOTE — Consult Note (Signed)
Summary: [facility]   Imported By: Lennie Odor 01/12/2010 15:39:28  _____________________________________________________________________  External Attachment:    Type:   Image     Comment:   External Document

## 2010-03-28 NOTE — Letter (Signed)
Summary: Pre Visit Letter Revised  [facility]  [address] [place], [place] [address]   Phone: [phone]  Fax: [phone]        12/12/2009 MRN: [id number] [facility] [address] [place], [place]  [address]             Procedure Date:  01-16-10   Welcome to the Gastroenterology Division at [facility].    You are scheduled to see a nurse for your pre-procedure visit on 01-02-10 at 1:00p.m. on the 3rd floor at [facility], [address]  We ask that you try to arrive at our office 15 minutes prior to your appointment time to allow for check-in.  Please take a minute to review the attached form.  If you answer "Yes" to one or more of the questions on the first page, we ask that you call the person listed at your earliest opportunity.  If you answer "No" to all of the questions, please complete the rest of the form and bring it to your appointment.    Your nurse visit will consist of discussing your medical and surgical history, your immediate family medical history, and your medications.   If you are unable to list all of your medications on the form, please bring the medication bottles to your appointment and we will list them.  We will need to be aware of both prescribed and over the counter drugs.  We will need to know exact dosage information as well.    Please be prepared to read and sign documents such as consent forms, a financial agreement, and acknowledgement forms.  If necessary, and with your consent, a friend or relative is welcome to sit-in on the nurse visit with you.  Please bring your insurance card so that we may make a copy of it.  If your insurance requires a referral to see a specialist, please bring your referral form from your primary care physician.  No co-pay is required for this nurse visit.     If you cannot keep your appointment, please call [phone] to cancel or reschedule prior to your appointment date.  This allows [place]  the opportunity to schedule an appointment for another patient in need of care.    Thank you for choosing [facility] for your medical needs.  We appreciate the opportunity to care for you.  Please visit [place] at our website  to learn more about our practice.  Sincerely, The Gastroenterology Division

## 2010-03-28 NOTE — Letter (Signed)
Summary: Patient Notice-Hyperplastic Polyps  [facility]  [address] [place], [place] [address]   Phone: [phone]  Fax: [phone]        January 17, 2010 MRN: [id number]    [facility] [address] [place], [place]  [address]    Dear Ms. Givler,  I am pleased to inform you that the colon polyp(s) removed during your recent colonoscopy was (were) found to be hyperplastic. These types of polyps are NOT pre-cancerous.  It is my recommendation that you have a repeat colonoscopy examination in 10 years for routine colorectal cancer screening.  Should you develop new or worsening symptoms of abdominal pain, bowel habit changes or bleeding from the rectum or bowels, please schedule an evaluation with either your primary care physician or with me.  Continue treatment plan as outlined the day of your exam.  Please call us if you are having persistent problems or have questions about your condition that have not been fully answered at this time.  Sincerely,  Meryl Dare MD [facility]  This letter has been electronically signed by your physician.  Appended Document: Patient Notice-Hyperplastic Polyps Letter mailed

## 2010-03-28 NOTE — Letter (Signed)
Summary: [facility] Instructions  [facility]  [address] [place], [place] [address]   Phone: [phone]  Fax: [phone]       DARRIANNA BLONDER    09-23-1945    MRN: [id number]        Procedure Day /Date:  01/16/10 Monday     Arrival Time:  10:00am      Procedure Time:  11:00am     Location of Procedure:                    _x _  [facility] (4th Floor)                        PREPARATION FOR COLONOSCOPY WITH MOVIPREP   Starting 5 days prior to your procedure _ 11/16/11_ do not eat nuts, seeds, popcorn, corn, beans, peas,  salads, or any raw vegetables.  Do not take any fiber supplements (e.g. Metamucil, Citrucel, and Benefiber).  THE DAY BEFORE YOUR PROCEDURE         DATE:  01/15/10  DAY:  Sunday  1.  Drink clear liquids the entire day-NO SOLID FOOD  2.  Do not drink anything colored red or purple.  Avoid juices with pulp.  No orange juice.  3.  Drink at least 64 oz. (8 glasses) of fluid/clear liquids during the day to prevent dehydration and help the prep work efficiently.  CLEAR LIQUIDS INCLUDE: Water Jello Ice Popsicles Tea (sugar ok, no milk/cream) Powdered fruit flavored drinks Coffee (sugar ok, no milk/cream) Gatorade Juice: apple, white grape, white cranberry  Lemonade Clear bullion, consomm, broth Carbonated beverages (any kind) Strained chicken noodle soup Hard Candy                             4.  In the morning, mix first dose of MoviPrep solution:    Empty 1 Pouch A and 1 Pouch B into the disposable container    Add lukewarm drinking water to the top line of the container. Mix to dissolve    Refrigerate (mixed solution should be used within 24 hrs)  5.  Begin drinking the prep at 5:00 p.m. The MoviPrep container is divided by 4 marks.   Every 15 minutes drink the solution down to the next mark (approximately 8 oz) until the full liter is complete.   6.  Follow completed prep with 16 oz of clear liquid of your choice  (Nothing red or purple).  Continue to drink clear liquids until bedtime.  7.  Before going to bed, mix second dose of MoviPrep solution:    Empty 1 Pouch A and 1 Pouch B into the disposable container    Add lukewarm drinking water to the top line of the container. Mix to dissolve    Refrigerate  THE DAY OF YOUR PROCEDURE      DATE:   01/16/10  DAY:  Monday  Beginning at  6:00 a.m. (5 hours before procedure):         1. Every 15 minutes, drink the solution down to the next mark (approx 8 oz) until the full liter is complete.  2. Follow completed prep with 16 oz. of clear liquid of your choice.    3. You may drink clear liquids until 9:00am  (2 HOURS BEFORE PROCEDURE).   MEDICATION INSTRUCTIONS  Unless otherwise instructed, you should take regular prescription medications with a small sip of  water   as early as possible the morning of your procedure.  Diabetic patients - see separate instructions.   Additional medication instructions: Hold HCTZ the morning of procedure.         OTHER INSTRUCTIONS  You will need a responsible adult at least 66 years of age to accompany you and drive you home.   This person must remain in the waiting room during your procedure.  Wear loose fitting clothing that is easily removed.  Leave jewelry and other valuables at home.  However, you may wish to bring a book to read or  an iPod/MP3 player to listen to music as you wait for your procedure to start.  Remove all body piercing jewelry and leave at home.  Total time from sign-in until discharge is approximately 2-3 hours.  You should go home directly after your procedure and rest.  You can resume normal activities the  day after your procedure.  The day of your procedure you should not:   Drive   Make legal decisions   Operate machinery   Drink alcohol   Return to work  You will receive specific instructions about eating, activities and medications before you  leave.    The above instructions have been reviewed and explained to me by  Wyona Almas RN  January 02, 2010 1:24 PM     I fully understand and can verbalize these instructions _____________________________ Date _________

## 2010-03-30 NOTE — Assessment & Plan Note (Signed)
Summary: fatigue/?low potassium/#/cd   Vital Signs:  Patient profile:   65 year old female Height:      59 inches Weight:      190.25 pounds BMI:     38.56 O2 Sat:      96 % on Room air Temp:     99.6 degrees F oral Pulse rate:   92 / minute BP sitting:   140 / 76  (left arm) Cuff size:   regular  Vitals Entered By: Zella Ball Ewing CMA (AAMA) (March 24, 2010 3:22 PM)  O2 Flow:  Room air CC: Weak, stress, urinating frequently at night/RE   CC:  Weak, stress, and urinating frequently at night/RE.  History of Present Illness: here to f/u; overall ok, but unfortunately since taking the increased strength actos to 30 mg since last visit has had graduall increased LE edema, now with mild discomfort to the feet, so here to find out what to do;  Pt denies CP, worsening sob, doe, wheezing, orthopnea, pnd,  palps, dizziness or syncope.  Pt denies new neuro symptoms such as headache, facial or extremity weakness  Pt denies polydipsia, polyuria, or low sugar symptoms such as shakiness improved with eating.  Overall good compliance with meds, trying to follow low chol, DM diet, wt stable, little excercise however  CBG's in low 100's.  No fever, wt loss, night sweats, loss of appetite or other constitutional symptoms  Overall good compliance with meds, and good tolerability.  Denies worsening depressive symptoms, suicidal ideation, or panic.  Had called recently and is finishing the avapro in the next few days, with change to benicar after that due to insurance.  Also mentions she is not actually taking the pravachol, as was not at last visit when the LDL was elevated, trying to work on diet alone.  Does have nocturia up to 2-3 times per night in the past month as well, but no dysuria, urgency or hematuria.  Also wanted to review recent labs, denies any past genital rash such as herpes type.  Currently in 5 yr relationship and no c/o of rash with partner.   Problems Prior to Update: 1)  Nocturia   (ICD-788.43) 2)  Preventive Health Care  (ICD-V70.0) 3)  Anemia-iron Deficiency  (ICD-280.9) 4)  Conjunctivitis, Bilateral  (ICD-372.30) 5)  Lipoma  (ICD-214.9) 6)  Diverticulosis, Colon  (ICD-562.10) 7)  Sexually Transmitted Disease, Exposure To  (ICD-V01.6) 8)  Hypothyroidism  (ICD-244.9) 9)  Hypertension  (ICD-401.9) 10)  Diabetes Mellitus, Type II  (ICD-250.00)  Medications Prior to Update: 1)  Norvasc 10 Mg Tabs (Amlodipine Besylate) .Marland Kitchen.. 1po Once Daily   - Daw 2)  Avapro 300mg  .... One By Mouth Once Daily 3)  Hctz 25mg  .... One By Mouth Once Daily 4)  Actos 30 Mg Tabs (Pioglitazone Hcl) .Marland Kitchen.. 1po Once Daily 5)  Pravastatin 40mg  .... 2 By Mouth Once Daily 6)  Omeprazole 40mg  .... One By Mouth Once Daily 7)  Coq10 100 Mg Caps (Coenzyme Q10) .Marland Kitchen.. 1 By Mouth Once Daily 8)  Tylenol Extra Strength 500 Mg Tabs (Acetaminophen) .Marland Kitchen.. 1 By Mouth Once Daily As Needed 9)  Aleve 220 Mg Tabs (Naproxen Sodium) .Marland Kitchen.. 1 By Mouth Once Daily As Needed 10)  Vitamin C 500 Mg Tabs (Ascorbic Acid) .Marland Kitchen.. 1 By Mouth Once Daily 11)  Vitamin E 400 Unit Caps (Vitamin E) .Marland Kitchen.. 1 By Mouth Once Daily 12)  Vitamin B-12 1000 Mcg Tabs (Cyanocobalamin) .Marland Kitchen.. 1 By Mouth Once Daily 13)  Vitamin D 400  Unit Tabs (Cholecalciferol) .Marland Kitchen.. 1 By Mouth Once Daily 14)  Hydrocodone-Acetaminophen 7.5-750 Mg Tabs (Hydrocodone-Acetaminophen) .Marland Kitchen.. 1 By Mouth Once Daily As Needed For Pain 15)  Tobramycin Sulfate 0.3 % Soln (Tobramycin Sulfate) .... 2 Gttts Both Eyes Four Times Per Day For 10 Days 16)  Benicar 40 Mg Tabs (Olmesartan Medoxomil) .Marland Kitchen.. 1 By Mouth Once Daily  Current Medications (verified): 1)  Norvasc 10 Mg Tabs (Amlodipine Besylate) .Marland Kitchen.. 1po Once Daily   - Daw 2)  Hydrochlorothiazide 25 Mg Tabs (Hydrochlorothiazide) .Marland Kitchen.. 1po Once Daily 3)  Januvia 100 Mg Tabs (Sitagliptin Phosphate) .Marland Kitchen.. 1po Once Daily 4)  Pravachol 40 Mg Tabs (Pravastatin Sodium) .Marland Kitchen.. 1po Once Daily 5)  Omeprazole 40mg  .... One By Mouth Once Daily 6)   Coq10 100 Mg Caps (Coenzyme Q10) .Marland Kitchen.. 1 By Mouth Once Daily 7)  Tylenol Extra Strength 500 Mg Tabs (Acetaminophen) .Marland Kitchen.. 1 By Mouth Once Daily As Needed 8)  Aleve 220 Mg Tabs (Naproxen Sodium) .Marland Kitchen.. 1 By Mouth Once Daily As Needed 9)  Vitamin C 500 Mg Tabs (Ascorbic Acid) .Marland Kitchen.. 1 By Mouth Once Daily 10)  Vitamin E 400 Unit Caps (Vitamin E) .Marland Kitchen.. 1 By Mouth Once Daily 11)  Vitamin B-12 1000 Mcg Tabs (Cyanocobalamin) .Marland Kitchen.. 1 By Mouth Once Daily 12)  Vitamin D 400 Unit Tabs (Cholecalciferol) .Marland Kitchen.. 1 By Mouth Once Daily 13)  Hydrocodone-Acetaminophen 7.5-750 Mg Tabs (Hydrocodone-Acetaminophen) .Marland Kitchen.. 1 By Mouth Once Daily As Needed For Pain 14)  Benicar 40 Mg Tabs (Olmesartan Medoxomil) .Marland Kitchen.. 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  , not ill apperaing Head:  normocephalic and atraumatic.   Eyes:  vision grossly intact, pupils equal, and pupils round.   Ears:  R ear normal and L ear normal.   Nose:  no external deformity and no nasal discharge.   Mouth:  no gingival abnormalities and pharynx pink and moist.   Neck:  supple and no masses.   Lungs:  normal respiratory effort and normal breath sounds.   Heart:  normal rate and  regular rhythm.   Genitalia:  deferred Extremities:  1-2+ edema to mid calves bilat without tender calve or rash/redness, ulcer   Impression & Recommendations:  Problem # 1:  NOCTURIA (ZOX-096.04) liekly related to edema, but will check urine studies - r/o infection Orders: T-Culture, Urine ([id number]) TLB-Udip w/ Micro (81001-URINE)  Problem # 2:  PERIPHERAL EDEMA (ICD-782.3)  Her updated medication list for this problem includes:    Hydrochlorothiazide 25 Mg Tabs (Hydrochlorothiazide) .Marland Kitchen... 1po once daily likely related to actos;  Continue all previous medications as before this visit , d/c actos, may need to decrease actos if not improved  Discussed elevation of the legs, use of compression stockings, sodium restiction, and medication use.   Problem # 3:  HYPERTENSION (ICD-401.9)  Her updated medication list for this problem includes:    Norvasc 10 Mg Tabs (Amlodipine besylate) .Marland Kitchen... 1po once daily   - daw    Hydrochlorothiazide 25 Mg Tabs (Hydrochlorothiazide) .Marland Kitchen... 1po once daily    Benicar 40 Mg Tabs (Olmesartan medoxomil) .Marland Kitchen... 1 by mouth once daily  BP today: 140/76 Prior BP: 142/90 (12/06/2009)  Labs Reviewed: K+: 4.0 (12/06/2009) Creat: : 0.7 (12/06/2009)   Chol: 179 (12/06/2009)   HDL: 47.20 (12/06/2009)   LDL: 117 (12/06/2009)   TG: 76.0 (12/06/2009) stable overall by hx and exam, ok to continue meds/tx as is , consider decr norvasc but ok to cont as is for now as above  Problem # 4:  HYPERLIPIDEMIA (ICD-272.4)  Her updated medication list for this problem includes:    Pravachol 40 Mg Tabs (Pravastatin sodium) .Marland Kitchen... 1po once daily treat as above, f/u any worsening signs or symptoms - encouraged to start med, f/u labs next visit  Labs Reviewed: SGOT: 20 (12/06/2009)   SGPT: 23 (12/06/2009)   HDL:47.20 (12/06/2009), 63 (03/22/2009)  LDL:117 (12/06/2009), 115 (03/22/2009)  Chol:179 (12/06/2009), 192 (03/22/2009)  Trig:76.0 (12/06/2009), 69  (03/22/2009)  Problem # 5:  DIABETES MELLITUS, TYPE II (ICD-250.00)  Her updated medication list for this problem includes:    Januvia 100 Mg Tabs (Sitagliptin phosphate) .Marland Kitchen... 1po once daily    Benicar 40 Mg Tabs (Olmesartan medoxomil) .Marland Kitchen... 1 by mouth once daily  Labs Reviewed: Creat: 0.7 (12/06/2009)    Reviewed HgBA1c results: 7.6 (12/06/2009)  6.6 (02/09/2009) d/c actos -  change to [place] as above, Pt to cont DM diet, excercise, wt control efforts; to check labs next visit  Problem # 6:  GENITAL HERPES (ICD-054.10) asympt - declines suppressive tx at this time  Complete Medication List: 1)  Norvasc 10 Mg Tabs (Amlodipine besylate) .Marland Kitchen.. 1po once daily   - daw 2)  Hydrochlorothiazide 25 Mg Tabs (Hydrochlorothiazide) .Marland Kitchen.. 1po once daily 3)  Januvia 100 Mg Tabs (Sitagliptin phosphate) .Marland Kitchen.. 1po once daily 4)  Pravachol 40 Mg Tabs (Pravastatin sodium) .Marland Kitchen.. 1po once daily 5)  Omeprazole 40mg   .... One by mouth once daily 6)  Coq10 100 Mg Caps (Coenzyme q10) .Marland Kitchen.. 1 by mouth once daily 7)  Tylenol Extra Strength 500 Mg Tabs (Acetaminophen) .Marland Kitchen.. 1 by mouth once daily as needed 8)  Aleve 220 Mg Tabs (Naproxen sodium) .Marland Kitchen.. 1 by mouth once daily as needed 9)  Vitamin C 500 Mg Tabs (Ascorbic acid) .Marland Kitchen.. 1 by mouth once daily 10)  Vitamin E 400 Unit Caps (Vitamin e) .Marland Kitchen.. 1 by mouth once daily 11)  Vitamin B-12 1000 Mcg Tabs (Cyanocobalamin) .Marland Kitchen.. 1 by mouth once daily 12)  Vitamin D 400 Unit Tabs (Cholecalciferol) .Marland Kitchen.. 1 by mouth once daily 13)  Hydrocodone-acetaminophen 7.5-750 Mg Tabs (Hydrocodone-acetaminophen) .Marland Kitchen.. 1 by mouth once daily as needed for pain 14)  Benicar 40 Mg Tabs (Olmesartan medoxomil) .Marland Kitchen.. 1 by mouth once daily  Patient Instructions: 1)  Please go to the Lab in the basement for your urine tests today  2)  stop the actos 3)  stop the avapro when the bottle is done 4)  start the januvia 100 mg per day for sugar 5)  start the benicar after the avapro is done 6)  Pleaese  only take one of the pravachol for cholesterol 7)  Continue all previous medications as before this visit 8)  Please schedule a follow-up appointment in 2 months wtih : 9)  BMP prior to visit, ICD-9:  250.02 10)  Hepatic Panel prior to visit, ICD-9: v58.69 11)  Lipid Panel prior to visit, ICD-9: 250.02 12)  HbgA1C prior to visit, ICD-9:250.02 Prescriptions: NORVASC 10 MG TABS (AMLODIPINE BESYLATE) 1po once daily   - DAW Brand medically necessary #90 x 3   Entered and Authorized by:   Corwin Levins MD   Signed by:   Corwin Levins MD on 03/24/2010   Method used:   Print then Give to Patient   RxID:   [id number] HYDROCHLOROTHIAZIDE 25 MG TABS (HYDROCHLOROTHIAZIDE) 1po once daily  #90 x 3   Entered and Authorized by:   Corwin Levins MD   Signed by:   Corwin Levins MD on 03/24/2010   Method used:   Print then Give to Patient   RxID:   [id number] BENICAR 40 MG TABS (OLMESARTAN MEDOXOMIL) 1 by mouth once daily  #90 x 3   Entered and Authorized by:   Corwin Levins MD   Signed by:   Corwin Levins MD on 03/24/2010   Method used:   Print then Give to Patient   RxID:   [id number] PRAVACHOL 40 MG TABS (PRAVASTATIN SODIUM) 1po once daily  #90 x 3   Entered and Authorized by:   Corwin Levins MD   Signed by:   Corwin Levins MD on 03/24/2010   Method used:   Print then Give to Patient   RxID:   [id number] JANUVIA 100 MG TABS (SITAGLIPTIN PHOSPHATE) 1po once daily  #90 x 3   Entered and Authorized by:   Corwin Levins MD   Signed by:   Corwin Levins MD on 03/24/2010   Method used:   Print then Give to Patient   RxID:   [id number]    Orders Added: 1)  T-Culture, Urine [[id number]] 2)  TLB-Udip w/ Micro [81001-URINE] 3)  Est. Patient Level IV [30160]

## 2010-03-30 NOTE — Progress Notes (Signed)
Summary: ALT MED  Phone Note Call from Patient Call back at [facility] Phone [phone]   Summary of Call: Avapro is not covered by insurance. Pt wants to be changed to alt which are losartan or benicar.  Initial call taken by: Lamar Sprinkles, CMA,  February 28, 2010 11:29 AM  Follow-up for Phone Call        ok to change to beincar 40 mg - to robin to handle Follow-up by: Corwin Levins MD,  February 28, 2010 1:10 PM  Additional Follow-up for Phone Call Additional follow up Details #1::        called patient informed JWJ did change to Benicar and sent prescription to her pharmacy. Additional Follow-up by: Robin Ewing CMA (AAMA),  February 28, 2010 2:24 PM    New/Updated Medications: BENICAR 40 MG TABS (OLMESARTAN MEDOXOMIL) 1 by mouth once daily Prescriptions: BENICAR 40 MG TABS (OLMESARTAN MEDOXOMIL) 1 by mouth once daily  #30 x 11   Entered by:   Zella Ball Ewing CMA (AAMA)   Authorized by:   Corwin Levins MD   Signed by:   Scharlene Gloss CMA (AAMA) on 02/28/2010   Method used:   Faxed to ...       [facility] [phone]* (retail)       [address]       [place], [place]  [address]       Ph: [phone] or [phone]       Fax: [phone]   RxID:   [id number]

## 2010-05-09 LAB — GLUCOSE, CAPILLARY
Glucose-Capillary: 172 mg/dL — ABNORMAL HIGH (ref 70–99)
Glucose-Capillary: 89 mg/dL (ref 70–99)

## 2010-05-16 ENCOUNTER — Other Ambulatory Visit: Payer: Self-pay

## 2010-05-21 LAB — POCT I-STAT, CHEM 8
BUN: 17 mg/dL (ref 6–23)
Calcium, Ion: 1.1 mmol/L — ABNORMAL LOW (ref 1.12–1.32)
Chloride: 103 mEq/L (ref 96–112)
Creatinine, Ser: 0.8 mg/dL (ref 0.4–1.2)
Glucose, Bld: 103 mg/dL — ABNORMAL HIGH (ref 70–99)
HCT: 42 % (ref 36.0–46.0)
Hemoglobin: 14.3 g/dL (ref 12.0–15.0)
Potassium: 3.4 mEq/L — ABNORMAL LOW (ref 3.5–5.1)
Sodium: 140 mEq/L (ref 135–145)
TCO2: 33 mmol/L (ref 0–100)

## 2010-05-21 LAB — POCT URINALYSIS DIP (DEVICE)
Bilirubin Urine: NEGATIVE
Glucose, UA: NEGATIVE mg/dL
Hgb urine dipstick: NEGATIVE
Ketones, ur: NEGATIVE mg/dL
Nitrite: NEGATIVE
Protein, ur: NEGATIVE mg/dL
Specific Gravity, Urine: 1.015 (ref 1.005–1.030)
Urobilinogen, UA: 0.2 mg/dL (ref 0.0–1.0)
pH: 6 (ref 5.0–8.0)

## 2010-05-23 ENCOUNTER — Encounter: Payer: Self-pay | Admitting: Internal Medicine

## 2010-05-23 ENCOUNTER — Other Ambulatory Visit: Payer: Self-pay | Admitting: Internal Medicine

## 2010-05-23 ENCOUNTER — Ambulatory Visit (INDEPENDENT_AMBULATORY_CARE_PROVIDER_SITE_OTHER): Payer: Medicare Other | Admitting: Internal Medicine

## 2010-05-23 ENCOUNTER — Other Ambulatory Visit (INDEPENDENT_AMBULATORY_CARE_PROVIDER_SITE_OTHER): Payer: Medicare Other

## 2010-05-23 DIAGNOSIS — E119 Type 2 diabetes mellitus without complications: Secondary | ICD-10-CM

## 2010-05-23 DIAGNOSIS — M79609 Pain in unspecified limb: Secondary | ICD-10-CM

## 2010-05-23 DIAGNOSIS — Z0000 Encounter for general adult medical examination without abnormal findings: Secondary | ICD-10-CM

## 2010-05-23 DIAGNOSIS — I10 Essential (primary) hypertension: Secondary | ICD-10-CM

## 2010-05-23 DIAGNOSIS — H5710 Ocular pain, unspecified eye: Secondary | ICD-10-CM

## 2010-05-23 DIAGNOSIS — E785 Hyperlipidemia, unspecified: Secondary | ICD-10-CM

## 2010-05-23 DIAGNOSIS — M79671 Pain in right foot: Secondary | ICD-10-CM

## 2010-05-23 DIAGNOSIS — Z0001 Encounter for general adult medical examination with abnormal findings: Secondary | ICD-10-CM | POA: Insufficient documentation

## 2010-05-23 DIAGNOSIS — H5711 Ocular pain, right eye: Secondary | ICD-10-CM

## 2010-05-23 LAB — BASIC METABOLIC PANEL
BUN: 14 mg/dL (ref 6–23)
CO2: 33 mEq/L — ABNORMAL HIGH (ref 19–32)
Calcium: 9.6 mg/dL (ref 8.4–10.5)
Chloride: 106 mEq/L (ref 96–112)
Creatinine, Ser: 0.7 mg/dL (ref 0.4–1.2)
GFR: 102.76 mL/min (ref >60.00)
Glucose, Bld: 120 mg/dL — ABNORMAL HIGH (ref 70–99)
Potassium: 4 mEq/L (ref 3.5–5.1)
Sodium: 145 mEq/L (ref 135–145)

## 2010-05-23 LAB — HEMOGLOBIN A1C: Hgb A1c MFr Bld: 7.7 % — ABNORMAL HIGH (ref 4.6–6.5)

## 2010-05-23 LAB — LDL CHOLESTEROL, DIRECT: Direct LDL: 134.6 mg/dL

## 2010-05-23 LAB — LIPID PANEL
Cholesterol: 216 mg/dL — ABNORMAL HIGH (ref 0–200)
HDL: 58 mg/dL (ref >39.00)
Total CHOL/HDL Ratio: 4
Triglycerides: 119 mg/dL (ref 0.0–149.0)
VLDL: 23.8 mg/dL (ref 0.0–40.0)

## 2010-05-23 NOTE — Patient Instructions (Signed)
Continue all other medications as before (ok to not take the januvia or pravastatin for now) You will be contacted regarding the referral for: opthomology, and podiatry Please go to LAB in the Basement for the blood and/or urine tests to be done today Please call the number on the [facility] Card for results in 2-3 days Please return in 6 mo with labs done 3-5 days prior:  Hgba1c, and CPX labs as ordered

## 2010-05-23 NOTE — Assessment & Plan Note (Signed)
Dorsal prox mid foot - ? Tendonitis - for podiatry referral - cont meds as is

## 2010-05-23 NOTE — Assessment & Plan Note (Signed)
Exam bening, but with chronic symptoms and due for BDR exam will needd optho referral

## 2010-05-23 NOTE — Progress Notes (Signed)
Subjective:    Patient ID: Brandi Shaffer, female    DOB: Nov 01, 1945, 65 y.o.   MRN: [id number]  HPI  Here to f/ u - overall doing ok;  Pt denies chest pain, increased sob or doe, wheezing, orthopnea, PND, increased LE swelling, palpitations, dizziness or syncope.    Pt denies new neurological symptoms such as new headache, or facial or extremity weakness or numbness.   Pt denies polydipsia, polyuria, or low sugar symptoms such as weakness or confusion improved with po intake.  Pt states overall good compliance with meds, trying to follow lower cholesterol, diabetic diet, wt overall stable and even lost a few lbs - 5 lbs since oct 2011, but little exercise however, mostly with better diet.  CBG';s in the lower 100's.  Did have to stop the [place] , however due to dizziness (but not with low sugars), took for whole week daily but could not take further.  Did have a coworker with a recent stroke and pt more motivated to diet.  Not currently taking the pravachol - kind of scared of taking it, trying to better with diet first.  Does also have some crampy, stinging, burning type discomfort to the dorsal foot just distal to the ankle joint;  Stands a lot a work, tries to wear comfortable shoe,  Had same prior yrs ago  Better with cortisone shot.   Also with 2 mo onset mild to mod itchy discomfort/? foregin body sensation to right eye, and requests to see optho,  Needs yearly BDR exam as well.  Past Medical History  Diagnosis Date  . ANEMIA-IRON DEFICIENCY 12/06/2009  . DIABETES MELLITUS, TYPE II 09/17/2006  . DIVERTICULOSIS, COLON 12/06/2009  . GENITAL HERPES 03/24/2010  . HYPERLIPIDEMIA 03/24/2010  . HYPERTENSION 09/17/2006  . HYPOTHYROIDISM 09/17/2006  . LIPOMA 12/06/2009  . PERIPHERAL EDEMA 03/24/2010   Past Surgical History  Procedure Date  . Tubal ligation   . Abdominal hysterectomy     fibroids  . Ankle surgury     x 3 left - chronic pain/swelling  . Tonsillectomy     reports that she has never  smoked. She does not have any smokeless tobacco history on file. She reports that she does not drink alcohol or use illicit drugs. family history includes Diabetes in her other; Heart disease in her sister; Hypertension in her father and mother; Joint hypermobility in her other; Prostate cancer in her brother; and Stroke in her sister. Allergies  Allergen Reactions  . Januvia (Sitagliptin Phosphate)     dizziness           Review of Systems Review of Systems  Constitutional: Negative for diaphoresis and unexpected weight change.  HENT: Negative for drooling and tinnitus.   Eyes: Negative for photophobia and visual disturbance.  Respiratory: Negative for choking and stridor.   Gastrointestinal: Negative for vomiting and blood in stool.  Genitourinary: Negative for hematuria and decreased urine volume.  Musculoskeletal: Negative for gait problem.  Skin: Negative for color change and wound.  Neurological: Negative for tremors and numbness.  Psychiatric/Behavioral: Negative for decreased concentration. The patient is not hyperactive.       Objective:   Physical Exam Physical Exam  VS noted Constitutional: Pt appears well-developed and well-nourished.  HENT: Head: Normocephalic.  Right Ear: External ear normal.  Left Ear: External ear normal.  Eyes: Conjunctivae and EOM are normal. Pupils are equal, round, and reactive to light.  Neck: Normal range of motion. Neck supple.  Cardiovascular: Normal rate and  regular rhythm.   Pulmonary/Chest: Effort normal and breath sounds normal.  Abd:  Soft, NT, non-distended, + BS Neurological: Pt is alert. No cranial nerve deficit.  Skin: Skin is warm. No erythema.  Psychiatric: Pt behavior is normal. Thought content normal.         Assessment & Plan:

## 2010-05-23 NOTE — Assessment & Plan Note (Signed)
stable overall by hx and exam, ok to continue meds/tx as is  Lab Results  Component Value Date   WBC 5.4 12/06/2009   HGB NEGATIVE 03/24/2010   HCT 41.9 12/06/2009   PLT 265.0 12/06/2009   CHOL 179 12/06/2009   TRIG 76.0 12/06/2009   HDL 47.20 12/06/2009   ALT 23 12/06/2009   AST 20 12/06/2009   NA 142 12/06/2009   K 4.0 12/06/2009   CL 106 12/06/2009   CREATININE 0.7 12/06/2009   BUN 12 12/06/2009   CO2 30 12/06/2009   TSH 1.20 12/06/2009   HGBA1C 7.6* 12/06/2009   MICROALBUR 1.3 12/06/2009

## 2010-05-23 NOTE — Assessment & Plan Note (Addendum)
stable overall by hx and exam, ok to continue meds/tx as is  - though not taking the statin at this time, d/w pt - goal ldl < 70  Lab Results  Component Value Date   LDLCALC 117* 12/06/2009

## 2010-05-23 NOTE — Assessment & Plan Note (Signed)
stable overall by hx and exam, ok to continue meds/tx as is, curretnly not taking the [place]  Lab Results  Component Value Date   HGBA1C 7.6* 12/06/2009

## 2010-05-24 ENCOUNTER — Other Ambulatory Visit: Payer: Self-pay

## 2010-05-24 ENCOUNTER — Other Ambulatory Visit: Payer: Self-pay | Admitting: Internal Medicine

## 2010-05-24 MED ORDER — METFORMIN HCL ER 500 MG PO TB24: 500.0000 mg | ORAL_TABLET | Freq: Every day | ORAL | Status: DC

## 2010-05-24 MED ORDER — PRAVASTATIN SODIUM 40 MG PO TABS: 40.0000 mg | ORAL_TABLET | Freq: Every evening | ORAL | Status: DC

## 2010-05-31 ENCOUNTER — Other Ambulatory Visit: Payer: Self-pay

## 2010-06-06 ENCOUNTER — Ambulatory Visit: Payer: Self-pay | Admitting: Internal Medicine

## 2010-07-06 ENCOUNTER — Other Ambulatory Visit: Payer: Medicare Other

## 2010-07-14 NOTE — Discharge Summary (Signed)
NAMEADELFA, Brandi Shaffer                ACCOUNT NO.:  [id number]   MEDICAL RECORD NO.:  [id number]          PATIENT TYPE:  INP   LOCATION:  5025                         FACILITY:  [facility]   PHYSICIAN:  Myrtie Neither, MD      DATE OF BIRTH:  [birth year]   DATE OF ADMISSION:  03/21/2006  DATE OF DISCHARGE:  03/23/2006                               DISCHARGE SUMMARY   ADMITTING DIAGNOSES:  1. Bimalleolar fracture left ankle.  2. History of diabetes.  3. Chronic anemia.  4. Hypertension.   DISCHARGE DIAGNOSES:  1. Bimalleolar fracture left ankle.  2. History of diabetes.  3. Chronic anemia.  4. Hypertension.   COMPLICATIONS:  None.   INFECTIONS:  None.   OPERATION:  Open reduction internal fixation of bimalleolar fracture  left ankle.   PERTINENT HISTORY:  This is a 64 year old female who sustained injury to  her left ankle approximately two weeks ago and initially was seen at  [facility] emergency room, found to have a bimalleolar fracture and  placed in a splint.  Patient was seen in the office and then scheduled  for ORIF of the left ankle.   PERTINENT PHYSICAL:  The left ankle is tender and swollen.  Neurovascular is intact.   X-rays revealed bimalleolar fracture of left ankle.   HOSPITAL COURSE:  Patient had preop laboratories, CBC, EKG, chest x-ray,  C-Met, UA which were found to be stable for patient to undergo surgery.  Patient underwent ORIF of the left ankle on March 21, 2006, tolerated  the procedure quite well and postop course was fairly benign.  Started  on ice packs, elevation.  Treated with postop IV antibiotics and pain  control.  Patient started physical therapy non-weightbearing on the left  side.  Patient tolerated  ambulation well and was stable enough to be discharged and was  discharged on Percocet one to two q.4.h. p.r.n. for pain, ice packs,  elevation and return to office in one week.  Patient will be discharged  in stable and satisfactory  condition.      Myrtie Neither, MD  Electronically Signed     AC/MEDQ  D:  05/09/2006  T:  05/09/2006  Job:  [id number]

## 2010-07-14 NOTE — Op Note (Signed)
NAMEJOSSELYN, Brandi Shaffer                ACCOUNT NO.:  [id number]   MEDICAL RECORD NO.:  [id number]          PATIENT TYPE:  INP   LOCATION:  2550                         FACILITY:  [facility]   PHYSICIAN:  Myrtie Neither, MD      DATE OF BIRTH:  08-11-1945   DATE OF PROCEDURE:  03/21/2006  DATE OF DISCHARGE:                               OPERATIVE REPORT   PREOPERATIVE DIAGNOSIS:  Bimalleolar fracture, left ankle.   POSTOPERATIVE DIAGNOSIS:  Bimalleolar fracture, left ankle.   ANESTHESIA:  General.   PROCEDURE:  Open reduction internal fixation bimalleolar fracture, left  ankle   ANESTHESIA:  General.   The patient was taken to the operating room after given adequate preop  medications, given general anesthesia and intubated.  Left lower  extremity was prepped with DuraPrep and draped in sterile manner.  Tourniquet and Bovie used for hemostasis.  Mini C-arm was used to  visualize manipulated fracture reduction.  Lateral incision made over  the lateral of the left ankle going through the skin and subcutaneous  tissue down to the lateral malleolus.  Soft tissue was subperiosteally  elevated.  Fracture site identified and a six-hole plate was applied for  five screws.  Next a medial Kocher incision made going through the skin  and subcutaneous tissue down to the medial malleolar fracture, medial  malleolus fracture was comminuted, manipulated, reduction was done,  again visualized with the mini C-arm, stabilized with two K-wires and  cannulated screws were used.  Visualization in AP lateral and oblique  views revealed anatomic reduction.  Copious irrigation was then done  followed by wound closure with use of 2-0 Vicryl for the subcutaneous,  skin staples for the skin.  Compressive dressing was applied and a Cam  walker applied.  The patient tolerated procedure quite well and went to  recovery room in stable and satisfactory position.      Myrtie Neither, MD  Electronically Signed     AC/MEDQ  D:  03/21/2006  T:  03/21/2006  Job:  [id number]

## 2010-07-14 NOTE — Op Note (Signed)
Brandi Shaffer, Brandi Shaffer                ACCOUNT NO.:  [id number]   MEDICAL RECORD NO.:  [id number]          PATIENT TYPE:  AMB   LOCATION:  SDS                          FACILITY:  [facility]   PHYSICIAN:  Myrtie Neither, MD      DATE OF BIRTH:  [birth year]   DATE OF PROCEDURE:  04/30/2006  DATE OF DISCHARGE:  04/30/2006                               OPERATIVE REPORT   PREOPERATIVE DIAGNOSIS:  Fracture medial malleolus with screw  distraction, left ankle.   POSTOPERATIVE DIAGNOSIS:  Fracture medial malleolus with screw  distraction, left ankle.   ANESTHESIA:  General.   PROCEDURE:  Open reduction and internal fixation medial malleolar  fracture, left ankle.   The patient was taken to the operating room. After being given adequate  preop medications, given general anesthesia.  The left lower extremity  was prepped from the ankle up to the knee with DuraPrep and draped in a  sterile manner.  The tourniquet and Bovie were used for hemostasis.  C-  arm was used to visualize and manipulate a fracture reduction. Going  through the old previous medial scar about the ankle, going through the  skin and subcutaneous tissue, sharp and blunt dissection was made down  to the fracture site and to the distracted medial malleolar fracture and  the extracted screws were identified and removed.  Fibrous tissue was  found between the fracture site.  Copious irrigation was done,  debridement of the fracture site was done with removal of the fibrous  tissue.  Manipulated reduction was done with the towel clamp. A K-wire  was used to hold the fracture site in an anatomic position and two  cannulated screws placed across the fracture site holding it in anatomic  position.  Copious irrigation was done followed by wound closure, 2-0  Vicryl for the subcutaneous and skin staples for the skin.  A bulky  compressive dressing was applied, a short leg cast was applied.  The  patient tolerated the procedure quite well   and went to the recovery  room in stable and satisfactory condition.      Myrtie Neither, MD  Electronically Signed     AC/MEDQ  D:  07/16/2006  T:  07/16/2006  Job:  [id number]

## 2010-09-01 ENCOUNTER — Encounter: Payer: Self-pay | Admitting: Internal Medicine

## 2010-09-01 ENCOUNTER — Ambulatory Visit (INDEPENDENT_AMBULATORY_CARE_PROVIDER_SITE_OTHER): Payer: Medicare Other | Admitting: Internal Medicine

## 2010-09-01 DIAGNOSIS — J069 Acute upper respiratory infection, unspecified: Secondary | ICD-10-CM

## 2010-09-01 DIAGNOSIS — I10 Essential (primary) hypertension: Secondary | ICD-10-CM

## 2010-09-01 DIAGNOSIS — E119 Type 2 diabetes mellitus without complications: Secondary | ICD-10-CM

## 2010-09-01 DIAGNOSIS — Z0000 Encounter for general adult medical examination without abnormal findings: Secondary | ICD-10-CM

## 2010-09-01 DIAGNOSIS — R42 Dizziness and giddiness: Secondary | ICD-10-CM

## 2010-09-01 MED ORDER — PIOGLITAZONE HCL 15 MG PO TABS: 15.0000 mg | ORAL_TABLET | Freq: Every day | ORAL | Status: DC

## 2010-09-01 MED ORDER — MECLIZINE HCL 12.5 MG PO TABS: 12.5000 mg | ORAL_TABLET | Freq: Three times a day (TID) | ORAL | Status: AC | PRN

## 2010-09-01 MED ORDER — AZITHROMYCIN 250 MG PO TABS: ORAL_TABLET | ORAL | Status: AC

## 2010-09-01 NOTE — Assessment & Plan Note (Signed)
stable overall by hx and exam, most recent data reviewed with pt, and pt to continue medical treatment as before BP Readings from Last 3 Encounters:  09/01/10 140/84  05/23/10 118/80  03/24/10 140/76

## 2010-09-01 NOTE — Progress Notes (Signed)
Subjective:    Patient ID: Brandi Shaffer, female    DOB: 1946-02-11, 65 y.o.   MRN: [id number]  HPI  Here with 3 days acute onset fever, bilat ear pain, pressure, general weakness and malaise, and greenish nasal d/c, with slight ST, but little to no cough and Pt denies chest pain, increased sob or doe, wheezing, orthopnea, PND, increased LE swelling, palpitations, dizziness or syncope.  Pt denies new neurological symptoms such as new headache, or facial or extremity weakness or numbness, but has recurrent vertigo with head movement as well, mild, no falls.   Pt denies polydipsia, polyuria, or low sugar symptoms such as weakness or confusion improved with po intake.  Pt states overall good compliance with meds, trying to follow lower cholesterol, diabetic diet, wt overall stable but little exercise however.    Past Medical History  Diagnosis Date  . ANEMIA-IRON DEFICIENCY 12/06/2009  . DIABETES MELLITUS, TYPE II 09/17/2006  . DIVERTICULOSIS, COLON 12/06/2009  . GENITAL HERPES 03/24/2010  . HYPERLIPIDEMIA 03/24/2010  . HYPERTENSION 09/17/2006  . HYPOTHYROIDISM 09/17/2006  . LIPOMA 12/06/2009  . PERIPHERAL EDEMA 03/24/2010   Past Surgical History  Procedure Date  . Tubal ligation   . Abdominal hysterectomy     fibroids  . Ankle surgury     x 3 left - chronic pain/swelling  . Tonsillectomy     reports that she has never smoked. She does not have any smokeless tobacco history on file. She reports that she does not drink alcohol or use illicit drugs. family history includes Diabetes in her other; Heart disease in her sister; Hypertension in her father and mother; Joint hypermobility in her other; Prostate cancer in her brother; and Stroke in her sister. Allergies  Allergen Reactions  . Januvia (Sitagliptin Phosphate)     dizziness   Current Outpatient Prescriptions on File Prior to Visit  Medication Sig Dispense Refill  . acetaminophen (TYLENOL) 500 MG tablet Take 500 mg by mouth daily as  needed.        Marland Kitchen amLODipine (NORVASC) 10 MG tablet Take 10 mg by mouth daily.        . Ascorbic Acid (VITAMIN C) 500 MG tablet Take 500 mg by mouth daily.        . Cholecalciferol (VITAMIN D) 400 UNITS tablet Take 400 Units by mouth daily.        . Coenzyme Q10 (COQ10) 100 MG capsule Take 100 mg by mouth daily.        . hydrochlorothiazide 25 MG tablet Take 25 mg by mouth daily.        Marland Kitchen HYDROcodone-acetaminophen (VICODIN ES) 7.5-750 MG per tablet Take 1 tablet by mouth daily as needed. For pain       . olmesartan (BENICAR) 40 MG tablet Take 40 mg by mouth daily.        Marland Kitchen omeprazole (PRILOSEC) 40 MG capsule Take 40 mg by mouth daily.        . pravastatin (PRAVACHOL) 40 MG tablet Take 40 mg by mouth daily.        . pravastatin (PRAVACHOL) 40 MG tablet Take 1 tablet (40 mg total) by mouth every evening.  90 tablet  3  . vitamin B-12 (CYANOCOBALAMIN) 1000 MCG tablet Take 1,000 mcg by mouth daily.        . vitamin E 400 UNIT capsule Take 400 Units by mouth daily.        . naproxen sodium (ANAPROX) 220 MG tablet Take 220 mg by mouth  daily as needed.        Marland Kitchen DISCONTD: metFORMIN (GLUCOPHAGE XR) 500 MG 24 hr tablet Take 1 tablet (500 mg total) by mouth daily with breakfast.  90 tablet  3   Review of Systems Review of Systems  Constitutional: Negative for diaphoresis and unexpected weight change.  HENT: Negative for drooling and tinnitus.   Eyes: Negative for photophobia and visual disturbance.  Respiratory: Negative for choking and stridor.   Gastrointestinal: Negative for vomiting and blood in stool.  Genitourinary: Negative for hematuria and decreased urine volume.  Neurological: Negative for tremors and numbness.  Psychiatric/Behavioral: Negative for decreased concentration. The patient is not hyperactive.       Objective:   Physical Exam BP 140/84  Pulse 94  Temp(Src) 98.9 F (37.2 C) (Oral)  Ht 4\' 11"  (1.499 m)  Wt 183 lb 6 oz (83.178 kg)  BMI 37.04 kg/m2  SpO2 97% Physical Exam   VS noted Constitutional: Pt appears well-developed and well-nourished.  HENT: Head: Normocephalic.  Right Ear: External ear normal.  Left Ear: External ear normal.  Bilat tm's mild erythema.  Sinus nontender.  Pharynx mild erythema Eyes: Conjunctivae and EOM are normal. Pupils are equal, round, and reactive to light.  Neck: Normal range of motion. Neck supple.  Cardiovascular: Normal rate and regular rhythm.   Pulmonary/Chest: Effort normal and breath sounds normal.  Abd:  Soft, NT, non-distended, + BS Neurological: Pt is alert. No cranial nerve deficit. motor/dtr/finger to nose intact Skin: Skin is warm. No erythema.  Psychiatric: Pt behavior is normal. Thought content normal. 1+ nervous        Assessment & Plan:

## 2010-09-01 NOTE — Patient Instructions (Addendum)
Take all new medications as prescribed - the antibiotic, and the meclizine for dizziness Continue all other medications as before, including the actos 15 mg You can also take Delsym OTC for cough, and/or Mucinex (or it's generic off brand) for congestion Please return Sept 25, 2012 as planned, with Lab testing done 3-5 days before

## 2010-09-01 NOTE — Assessment & Plan Note (Signed)
Mild to mod, for meclizine trial, liekly due to URI and middle ear involvement,. to f/u any worsening symptoms or concerns

## 2010-09-01 NOTE — Assessment & Plan Note (Signed)
Unable to tolerate the metformin, to re-start the actos 15 mg, f/u labs next visit, cont diet and wt loss efforts

## 2010-09-01 NOTE — Assessment & Plan Note (Signed)
Mild to mod, for antibx course,  to f/u any worsening symptoms or concerns 

## 2010-09-09 ENCOUNTER — Inpatient Hospital Stay (INDEPENDENT_AMBULATORY_CARE_PROVIDER_SITE_OTHER)
Admission: RE | Admit: 2010-09-09 | Discharge: 2010-09-09 | Disposition: A | Discharge status: Home / Self Care | Payer: Medicare Other | Source: Ambulatory Visit | Attending: Emergency Medicine | Admitting: Emergency Medicine

## 2010-09-09 ENCOUNTER — Ambulatory Visit (INDEPENDENT_AMBULATORY_CARE_PROVIDER_SITE_OTHER): Payer: Medicare Other

## 2010-09-09 DIAGNOSIS — M79609 Pain in unspecified limb: Secondary | ICD-10-CM

## 2010-10-27 ENCOUNTER — Telehealth: Payer: Self-pay | Admitting: *Deleted

## 2010-10-27 NOTE — Telephone Encounter (Signed)
Patient requesting a call back regarding her BP

## 2010-10-27 NOTE — Telephone Encounter (Signed)
The patient went to pickup prescriptions at the pharmacy and they said she had too many BP prescriptions and would not fill them. Please advise.

## 2010-10-27 NOTE — Telephone Encounter (Signed)
Please clarify, pt had 3 rx for BP on med list which is not unusual

## 2010-10-31 NOTE — Telephone Encounter (Signed)
Called patient informed of MD's instructions.

## 2010-11-21 ENCOUNTER — Ambulatory Visit (INDEPENDENT_AMBULATORY_CARE_PROVIDER_SITE_OTHER): Payer: Medicare Other | Admitting: Internal Medicine

## 2010-11-21 ENCOUNTER — Other Ambulatory Visit (INDEPENDENT_AMBULATORY_CARE_PROVIDER_SITE_OTHER): Payer: Medicare Other

## 2010-11-21 ENCOUNTER — Encounter: Payer: Self-pay | Admitting: Internal Medicine

## 2010-11-21 VITALS — BP 130/90 | HR 73 | Temp 98.3°F | Ht 59.0 in | Wt 190.0 lb

## 2010-11-21 DIAGNOSIS — Z0000 Encounter for general adult medical examination without abnormal findings: Secondary | ICD-10-CM

## 2010-11-21 DIAGNOSIS — Z23 Encounter for immunization: Secondary | ICD-10-CM

## 2010-11-21 DIAGNOSIS — E119 Type 2 diabetes mellitus without complications: Secondary | ICD-10-CM

## 2010-11-21 LAB — URINALYSIS, ROUTINE W REFLEX MICROSCOPIC
Bilirubin Urine: NEGATIVE
Hgb urine dipstick: NEGATIVE
Ketones, ur: NEGATIVE
Leukocytes, UA: NEGATIVE
Nitrite: NEGATIVE
Specific Gravity, Urine: 1.015 (ref 1.000–1.030)
Total Protein, Urine: NEGATIVE
Urine Glucose: NEGATIVE
Urobilinogen, UA: 0.2 (ref 0.0–1.0)
pH: 6.5 (ref 5.0–8.0)

## 2010-11-21 LAB — CBC WITH DIFFERENTIAL/PLATELET
Basophils Absolute: 0 10*3/uL (ref 0.0–0.1)
Basophils Relative: 0.7 % (ref 0.0–3.0)
Eosinophils Absolute: 0.2 10*3/uL (ref 0.0–0.7)
Eosinophils Relative: 3.6 % (ref 0.0–5.0)
HCT: 38.7 % (ref 36.0–46.0)
Hemoglobin: 13 g/dL (ref 12.0–15.0)
Lymphocytes Relative: 44.7 % (ref 12.0–46.0)
Lymphs Abs: 2.1 10*3/uL (ref 0.7–4.0)
MCHC: 33.6 g/dL (ref 30.0–36.0)
MCV: 90.5 fl (ref 78.0–100.0)
Monocytes Absolute: 0.4 10*3/uL (ref 0.1–1.0)
Monocytes Relative: 8.3 % (ref 3.0–12.0)
Neutro Abs: 2 10*3/uL (ref 1.4–7.7)
Neutrophils Relative %: 42.7 % — ABNORMAL LOW (ref 43.0–77.0)
Platelets: 244 10*3/uL (ref 150.0–400.0)
RBC: 4.28 Mil/uL (ref 3.87–5.11)
RDW: 14 % (ref 11.5–14.6)
WBC: 4.7 10*3/uL (ref 4.5–10.5)

## 2010-11-21 LAB — HEPATIC FUNCTION PANEL
ALT: 16 U/L (ref 0–35)
AST: 17 U/L (ref 0–37)
Albumin: 3.8 g/dL (ref 3.5–5.2)
Alkaline Phosphatase: 104 U/L (ref 39–117)
Bilirubin, Direct: 0.1 mg/dL (ref 0.0–0.3)
Total Bilirubin: 0.7 mg/dL (ref 0.3–1.2)
Total Protein: 7 g/dL (ref 6.0–8.3)

## 2010-11-21 LAB — BASIC METABOLIC PANEL
BUN: 15 mg/dL (ref 6–23)
CO2: 31 mEq/L (ref 19–32)
Calcium: 9.1 mg/dL (ref 8.4–10.5)
Chloride: 108 mEq/L (ref 96–112)
Creatinine, Ser: 0.7 mg/dL (ref 0.4–1.2)
GFR: 101 mL/min (ref >60.00)
Glucose, Bld: 103 mg/dL — ABNORMAL HIGH (ref 70–99)
Potassium: 3.7 mEq/L (ref 3.5–5.1)
Sodium: 145 mEq/L (ref 135–145)

## 2010-11-21 LAB — MICROALBUMIN / CREATININE URINE RATIO
Creatinine,U: 86.2 mg/dL
Microalb Creat Ratio: 0.6 mg/g (ref 0.0–30.0)
Microalb, Ur: 0.5 mg/dL (ref 0.0–1.9)

## 2010-11-21 LAB — LIPID PANEL
Cholesterol: 182 mg/dL (ref 0–200)
HDL: 60.5 mg/dL (ref >39.00)
LDL Cholesterol: 110 mg/dL — ABNORMAL HIGH (ref 0–99)
Total CHOL/HDL Ratio: 3
Triglycerides: 56 mg/dL (ref 0.0–149.0)
VLDL: 11.2 mg/dL (ref 0.0–40.0)

## 2010-11-21 LAB — HEMOGLOBIN A1C: Hgb A1c MFr Bld: 6.4 % (ref 4.6–6.5)

## 2010-11-21 LAB — TSH: TSH: 1.76 u[IU]/mL (ref 0.35–5.50)

## 2010-11-21 MED ORDER — IRBESARTAN 300 MG PO TABS: 300.0000 mg | ORAL_TABLET | Freq: Every day | ORAL | Status: DC

## 2010-11-21 NOTE — Progress Notes (Signed)
Addended by: Scharlene Gloss B on: 11/21/2010 08:59 AM   Modules accepted: Orders

## 2010-11-21 NOTE — Patient Instructions (Signed)
You had the flu shot today Continue all other medications as before Please go to LAB in the Basement for the blood and/or urine tests to be done today Please call the phone number [phone] (the PhoneTree System) for results of testing in 2-3 days;  When calling, simply dial the number, and when prompted enter the MRN number above (the Medical Record Number) and the # key, then the message should start. Please return in 6 mo with Lab testing done 3-5 days before  

## 2010-11-21 NOTE — Progress Notes (Signed)
Subjective:    Patient ID: Brandi Shaffer, female    DOB: 1945-02-28, 65 y.o.   MRN: [id number]  HPI  Here for wellness and f/u;  Overall doing ok;  Pt denies CP, worsening SOB, DOE, wheezing, orthopnea, PND, worsening LE edema, palpitations, dizziness or syncope.  Pt denies neurological change such as new Headache, facial or extremity weakness.  Pt denies polydipsia, polyuria, or low sugar symptoms. Pt states overall good compliance with treatment and medications, good tolerability, and trying to follow lower cholesterol diet.  Pt denies worsening depressive symptoms, suicidal ideation or panic. No fever, wt loss, night sweats, loss of appetite, or other constitutional symptoms.  Pt states good ability with ADL's, low fall risk, home safety reviewed and adequate, no significant changes in hearing or vision, and occasionally active with exercise. Has  Cough for 3 wks nonprod without fever, with mild nasal allergy symptoms.  No ST or reflux.  Could not tolerate metformin due to GI distress Past Medical History  Diagnosis Date  . ANEMIA-IRON DEFICIENCY 12/06/2009  . DIABETES MELLITUS, TYPE II 09/17/2006  . DIVERTICULOSIS, COLON 12/06/2009  . GENITAL HERPES 03/24/2010  . HYPERLIPIDEMIA 03/24/2010  . HYPERTENSION 09/17/2006  . HYPOTHYROIDISM 09/17/2006  . LIPOMA 12/06/2009  . PERIPHERAL EDEMA 03/24/2010   Past Surgical History  Procedure Date  . Tubal ligation   . Abdominal hysterectomy     fibroids  . Ankle surgury     x 3 left - chronic pain/swelling  . Tonsillectomy     reports that she has never smoked. She does not have any smokeless tobacco history on file. She reports that she does not drink alcohol or use illicit drugs. family history includes Diabetes in her other; Heart disease in her sister; Hypertension in her father and mother; Joint hypermobility in her other; Prostate cancer in her brother; and Stroke in her sister. Allergies  Allergen Reactions  . Januvia (Sitagliptin Phosphate)      dizziness   Current Outpatient Prescriptions on File Prior to Visit  Medication Sig Dispense Refill  . acetaminophen (TYLENOL) 500 MG tablet Take 500 mg by mouth daily as needed.        Marland Kitchen amLODipine (NORVASC) 10 MG tablet Take 10 mg by mouth daily.        . Ascorbic Acid (VITAMIN C) 500 MG tablet Take 500 mg by mouth daily.        . Cholecalciferol (VITAMIN D) 400 UNITS tablet Take 400 Units by mouth daily.        . Coenzyme Q10 (COQ10) 100 MG capsule Take 100 mg by mouth daily.        . hydrochlorothiazide 25 MG tablet Take 25 mg by mouth daily.        Marland Kitchen HYDROcodone-acetaminophen (VICODIN ES) 7.5-750 MG per tablet Take 1 tablet by mouth daily as needed. For pain       . naproxen sodium (ANAPROX) 220 MG tablet Take 220 mg by mouth daily as needed.        Marland Kitchen olmesartan (BENICAR) 40 MG tablet Take 40 mg by mouth daily.        Marland Kitchen omeprazole (PRILOSEC) 40 MG capsule Take 40 mg by mouth daily.        . pioglitazone (ACTOS) 15 MG tablet Take 1 tablet (15 mg total) by mouth daily.  90 tablet  3  . pravastatin (PRAVACHOL) 40 MG tablet Take 40 mg by mouth daily.        . pravastatin (PRAVACHOL) 40 MG  tablet Take 1 tablet (40 mg total) by mouth every evening.  90 tablet  3  . vitamin B-12 (CYANOCOBALAMIN) 1000 MCG tablet Take 1,000 mcg by mouth daily.        . vitamin E 400 UNIT capsule Take 400 Units by mouth daily.         Review of Systems Review of Systems  Constitutional: Negative for diaphoresis, activity change, appetite change and unexpected weight change.  HENT: Negative for hearing loss, ear pain, facial swelling, mouth sores and neck stiffness.   Eyes: Negative for pain, redness and visual disturbance.  Respiratory: Negative for shortness of breath and wheezing.   Cardiovascular: Negative for chest pain and palpitations.  Gastrointestinal: Negative for diarrhea, blood in stool, abdominal distention and rectal pain.  Genitourinary: Negative for hematuria, flank pain and decreased urine  volume.  Musculoskeletal: Negative for myalgias and joint swelling.  Skin: Negative for color change and wound.  Neurological: Negative for syncope and numbness.  Hematological: Negative for adenopathy.  Psychiatric/Behavioral: Negative for hallucinations, self-injury, decreased concentration and agitation.      Objective:   Physical Exam Blood pressure 130/90, pulse 73, temperature 98.3 F (36.8 C), temperature source Oral, height 4\' 11"  (1.499 m), weight 190 lb (86.183 kg), SpO2 96.00%. Physical Exam  VS noted Constitutional: Pt is oriented to person, place, and time. Appears well-developed and well-nourished.  HENT:  Head: Normocephalic and atraumatic.  Right Ear: External ear normal.  Left Ear: External ear normal.  Nose: Nose normal.  Mouth/Throat: Oropharynx is clear and moist.  Eyes: Conjunctivae and EOM are normal. Pupils are equal, round, and reactive to light.  Neck: Normal range of motion. Neck supple. No JVD present. No tracheal deviation present.  Cardiovascular: Normal rate, regular rhythm, normal heart sounds and intact distal pulses.   Pulmonary/Chest: Effort normal and breath sounds normal.  Abdominal: Soft. Bowel sounds are normal. There is no tenderness.  Musculoskeletal: Normal range of motion. Exhibits no edema.  Lymphadenopathy:  Has no cervical adenopathy.  Neurological: Pt is alert and oriented to person, place, and time. Pt has normal reflexes. No cranial nerve deficit.  Skin: Skin is warm and dry. No rash noted.  Psychiatric:  Has  normal mood and affect. Behavior is normal.     Assessment & Plan:

## 2010-12-06 LAB — URINE CULTURE: Colony Count: >=100000

## 2010-12-06 LAB — POCT URINALYSIS DIP (DEVICE)
Bilirubin Urine: NEGATIVE
Glucose, UA: NEGATIVE
Ketones, ur: NEGATIVE
Nitrite: NEGATIVE
Operator id: 247071
Protein, ur: >=300 — AB
Specific Gravity, Urine: 1.02
Urobilinogen, UA: 1
pH: 7

## 2010-12-25 ENCOUNTER — Other Ambulatory Visit: Payer: Self-pay

## 2010-12-25 MED ORDER — AMLODIPINE BESYLATE 10 MG PO TABS: 10.0000 mg | ORAL_TABLET | Freq: Every day | ORAL | Status: DC

## 2011-02-02 ENCOUNTER — Other Ambulatory Visit: Payer: Self-pay

## 2011-02-02 MED ORDER — HYDROCHLOROTHIAZIDE 25 MG PO TABS: 25.0000 mg | ORAL_TABLET | Freq: Every day | ORAL | Status: DC

## 2011-04-17 ENCOUNTER — Other Ambulatory Visit (INDEPENDENT_AMBULATORY_CARE_PROVIDER_SITE_OTHER): Payer: Medicare Other

## 2011-04-17 ENCOUNTER — Ambulatory Visit (INDEPENDENT_AMBULATORY_CARE_PROVIDER_SITE_OTHER): Payer: Medicare Other | Admitting: Internal Medicine

## 2011-04-17 ENCOUNTER — Encounter: Payer: Self-pay | Admitting: Internal Medicine

## 2011-04-17 VITALS — BP 120/80 | HR 81 | Temp 98.4°F | Ht 59.0 in | Wt 192.2 lb

## 2011-04-17 DIAGNOSIS — E119 Type 2 diabetes mellitus without complications: Secondary | ICD-10-CM

## 2011-04-17 DIAGNOSIS — Z0000 Encounter for general adult medical examination without abnormal findings: Secondary | ICD-10-CM

## 2011-04-17 DIAGNOSIS — I10 Essential (primary) hypertension: Secondary | ICD-10-CM

## 2011-04-17 DIAGNOSIS — E785 Hyperlipidemia, unspecified: Secondary | ICD-10-CM

## 2011-04-17 DIAGNOSIS — G5601 Carpal tunnel syndrome, right upper limb: Secondary | ICD-10-CM

## 2011-04-17 DIAGNOSIS — G5600 Carpal tunnel syndrome, unspecified upper limb: Secondary | ICD-10-CM

## 2011-04-17 DIAGNOSIS — R609 Edema, unspecified: Secondary | ICD-10-CM

## 2011-04-17 LAB — BASIC METABOLIC PANEL
BUN: 16 mg/dL (ref 6–23)
CO2: 28 mEq/L (ref 19–32)
Calcium: 9.1 mg/dL (ref 8.4–10.5)
Chloride: 105 mEq/L (ref 96–112)
Creatinine, Ser: 0.8 mg/dL (ref 0.4–1.2)
GFR: 97.82 mL/min (ref >60.00)
Glucose, Bld: 102 mg/dL — ABNORMAL HIGH (ref 70–99)
Potassium: 3.5 mEq/L (ref 3.5–5.1)
Sodium: 145 mEq/L (ref 135–145)

## 2011-04-17 LAB — HEMOGLOBIN A1C: Hgb A1c MFr Bld: 6.7 % — ABNORMAL HIGH (ref 4.6–6.5)

## 2011-04-17 LAB — LIPID PANEL
Cholesterol: 188 mg/dL (ref 0–200)
HDL: 54.5 mg/dL (ref >39.00)
LDL Cholesterol: 122 mg/dL — ABNORMAL HIGH (ref 0–99)
Total CHOL/HDL Ratio: 3
Triglycerides: 56 mg/dL (ref 0.0–149.0)
VLDL: 11.2 mg/dL (ref 0.0–40.0)

## 2011-04-17 MED ORDER — IRBESARTAN-HYDROCHLOROTHIAZIDE 300-25 MG PO TABS: 1.0000 | ORAL_TABLET | Freq: Every day | ORAL | Status: DC

## 2011-04-17 MED ORDER — HYDROCODONE-ACETAMINOPHEN 7.5-750 MG PO TABS: 1.0000 | ORAL_TABLET | Freq: Every day | ORAL | Status: DC | PRN

## 2011-04-17 MED ORDER — AMLODIPINE BESYLATE 5 MG PO TABS: 5.0000 mg | ORAL_TABLET | Freq: Every day | ORAL | Status: DC

## 2011-04-17 NOTE — Patient Instructions (Addendum)
OK to stop the Benicar, Avapro, hydrochlorothiazide, and Norvasc 10 mg Start the Avalide 300/25 mg - 1 per day Start the Norvasc 5 mg per day Please wear the right wrist splint at night only, to help the symptoms of carpal tunnel during the day Continue all other medications as before, including the hydrocodone Please go to LAB in the Basement for the blood and/or urine tests to be done today Please call the phone number [phone] (the PhoneTree System) for results of testing in 2-3 days;  When calling, simply dial the number, and when prompted enter the MRN number above (the Medical Record Number) and the # key, then the message should start. Please return in 6 mo with Lab testing done 3-5 days before

## 2011-04-22 ENCOUNTER — Encounter: Payer: Self-pay | Admitting: Internal Medicine

## 2011-04-22 DIAGNOSIS — G5601 Carpal tunnel syndrome, right upper limb: Secondary | ICD-10-CM | POA: Insufficient documentation

## 2011-04-22 NOTE — Assessment & Plan Note (Signed)
Mild worsening on the amlod 10 - for med adjustment as per HTN today

## 2011-04-22 NOTE — Assessment & Plan Note (Signed)
stable overall by hx and exam, most recent data reviewed with pt, and pt to continue medical treatment as before  Lab Results  Component Value Date   HGBA1C 6.7* 04/17/2011

## 2011-04-22 NOTE — Assessment & Plan Note (Signed)
Mild, for right wrist splint,  to f/u any worsening symptoms or concerns, consider ncs, hand surgury referral

## 2011-04-22 NOTE — Progress Notes (Signed)
Subjective:    Patient ID: Brandi Shaffer, female    DOB: [birth year], 66 y.o.   MRN: [id number]  HPI  Here to f/u; overall doing ok,  Pt denies chest pain, increased sob or doe, wheezing, orthopnea, PND, increased LE swelling, palpitations, dizziness or syncope, except for mild edema recently on the 10 mg amlodipine.  Pt denies new neurological symptoms such as new headache, or facial or extremity weakness or numbness   Pt denies polydipsia, polyuria, or low sugar symptoms such as weakness or confusion improved with po intake.  Pt states overall good compliance with meds, trying to follow lower cholesterol, diabetic diet, wt overall stable but little exercise however.  Also with right CTS like symptoms with mild, burning like pain and intermittent nubmness without weakness, swelling, rash, but some radiation towards the elbow, worse x 2 wks, worse to do crochet at home, not better with anything. Past Medical History  Diagnosis Date  . ANEMIA-IRON DEFICIENCY 12/06/2009  . DIABETES MELLITUS, TYPE II 09/17/2006  . DIVERTICULOSIS, COLON 12/06/2009  . GENITAL HERPES 03/24/2010  . HYPERLIPIDEMIA 03/24/2010  . HYPERTENSION 09/17/2006  . HYPOTHYROIDISM 09/17/2006  . LIPOMA 12/06/2009  . PERIPHERAL EDEMA 03/24/2010   Past Surgical History  Procedure Date  . Tubal ligation   . Abdominal hysterectomy     fibroids  . Ankle surgury     x 3 left - chronic pain/swelling  . Tonsillectomy     reports that she has never smoked. She does not have any smokeless tobacco history on file. She reports that she does not drink alcohol or use illicit drugs. family history includes Diabetes in her other; Heart disease in her sister; Hypertension in her father and mother; Joint hypermobility in her other; Prostate cancer in her brother; and Stroke in her sister. Allergies  Allergen Reactions  . Januvia (Sitagliptin Phosphate)     dizziness  . Metformin And Related Nausea Only   Current Outpatient Prescriptions on  File Prior to Visit  Medication Sig Dispense Refill  . Ascorbic Acid (VITAMIN C) 500 MG tablet Take 500 mg by mouth daily.        . Cholecalciferol (VITAMIN D) 400 UNITS tablet Take 400 Units by mouth daily.        . Coenzyme Q10 (COQ10) 100 MG capsule Take 100 mg by mouth daily.        . naproxen sodium (ANAPROX) 220 MG tablet Take 220 mg by mouth daily as needed.        Marland Kitchen omeprazole (PRILOSEC) 40 MG capsule Take 40 mg by mouth daily.        . pioglitazone (ACTOS) 15 MG tablet Take 1 tablet (15 mg total) by mouth daily.  90 tablet  3  . pravastatin (PRAVACHOL) 40 MG tablet Take 1 tablet (40 mg total) by mouth every evening.  90 tablet  3  . vitamin B-12 (CYANOCOBALAMIN) 1000 MCG tablet Take 1,000 mcg by mouth daily.        . vitamin E 400 UNIT capsule Take 400 Units by mouth daily.         Review of Systems Review of Systems  Constitutional: Negative for diaphoresis and unexpected weight change.  HENT: Negative for drooling and tinnitus.   Eyes: Negative for photophobia and visual disturbance.  Respiratory: Negative for choking and stridor.   Gastrointestinal: Negative for vomiting and blood in stool.  Genitourinary: Negative for hematuria and decreased urine volume.       Objective:   Physical Exam  BP 120/80  Pulse 81  Temp(Src) 98.4 F (36.9 C) (Oral)  Ht 4\' 11"  (1.499 m)  Wt 192 lb 4 oz (87.204 kg)  BMI 38.83 kg/m2  SpO2 99% Physical Exam  VS noted, obese, not ill appearing Constitutional: Pt appears well-developed and well-nourished.  HENT: Head: Normocephalic.  Right Ear: External ear normal.  Left Ear: External ear normal.  Eyes: Conjunctivae and EOM are normal. Pupils are equal, round, and reactive to light.  Neck: Normal range of motion. Neck supple.  Cardiovascular: Normal rate and regular rhythm.   Pulmonary/Chest: Effort normal and breath sounds normal.  Neurological: Pt is alert. No cranial nerve deficit.  Skin: Skin is warm. No erythema.  LE with  Trace to 1+  edema bilat                          Psychiatric: Pt behavior is normal. Thought content normal.     Assessment & Plan:

## 2011-04-22 NOTE — Assessment & Plan Note (Signed)
With edema on the 10 mg amlodipine; to change to avalide 300/25, and decreased amlod 5 mg, f/u BP at home and next visit,  to f/u any worsening symptoms or concerns

## 2011-04-22 NOTE — Assessment & Plan Note (Signed)
stable overall by hx and exam, most recent data reviewed with pt, and pt to continue medical treatment as before, consider incr pravachol, goal ldl < 70  Lab Results  Component Value Date   LDLCALC 122* 04/17/2011

## 2011-05-21 ENCOUNTER — Ambulatory Visit: Payer: Medicare Other | Admitting: Internal Medicine

## 2011-06-26 ENCOUNTER — Other Ambulatory Visit: Payer: Self-pay

## 2011-06-26 MED ORDER — OMEPRAZOLE 40 MG PO CPDR: 40.0000 mg | DELAYED_RELEASE_CAPSULE | Freq: Every day | ORAL | Status: DC

## 2011-06-28 ENCOUNTER — Encounter: Payer: Self-pay | Admitting: Internal Medicine

## 2011-06-28 ENCOUNTER — Ambulatory Visit (INDEPENDENT_AMBULATORY_CARE_PROVIDER_SITE_OTHER): Payer: Medicare Other | Admitting: Internal Medicine

## 2011-06-28 ENCOUNTER — Other Ambulatory Visit (INDEPENDENT_AMBULATORY_CARE_PROVIDER_SITE_OTHER): Payer: Medicare Other

## 2011-06-28 VITALS — BP 152/88 | HR 78 | Temp 97.5°F | Ht 59.0 in | Wt 194.5 lb

## 2011-06-28 DIAGNOSIS — IMO0001 Reserved for inherently not codable concepts without codable children: Secondary | ICD-10-CM

## 2011-06-28 DIAGNOSIS — M545 Low back pain, unspecified: Secondary | ICD-10-CM | POA: Insufficient documentation

## 2011-06-28 DIAGNOSIS — Z0000 Encounter for general adult medical examination without abnormal findings: Secondary | ICD-10-CM

## 2011-06-28 DIAGNOSIS — I10 Essential (primary) hypertension: Secondary | ICD-10-CM

## 2011-06-28 DIAGNOSIS — R1011 Right upper quadrant pain: Secondary | ICD-10-CM

## 2011-06-28 DIAGNOSIS — K219 Gastro-esophageal reflux disease without esophagitis: Secondary | ICD-10-CM

## 2011-06-28 DIAGNOSIS — E119 Type 2 diabetes mellitus without complications: Secondary | ICD-10-CM

## 2011-06-28 HISTORY — DX: Gastro-esophageal reflux disease without esophagitis: K21.9

## 2011-06-28 LAB — CBC WITH DIFFERENTIAL/PLATELET
Basophils Absolute: 0 10*3/uL (ref 0.0–0.1)
Basophils Relative: 0.8 % (ref 0.0–3.0)
Eosinophils Absolute: 0.2 10*3/uL (ref 0.0–0.7)
Eosinophils Relative: 3.3 % (ref 0.0–5.0)
HCT: 38.9 % (ref 36.0–46.0)
Hemoglobin: 13 g/dL (ref 12.0–15.0)
Lymphocytes Relative: 43.4 % (ref 12.0–46.0)
Lymphs Abs: 2.6 10*3/uL (ref 0.7–4.0)
MCHC: 33.5 g/dL (ref 30.0–36.0)
MCV: 90.3 fl (ref 78.0–100.0)
Monocytes Absolute: 0.5 10*3/uL (ref 0.1–1.0)
Monocytes Relative: 8.4 % (ref 3.0–12.0)
Neutro Abs: 2.6 10*3/uL (ref 1.4–7.7)
Neutrophils Relative %: 44.1 % (ref 43.0–77.0)
Platelets: 238 10*3/uL (ref 150.0–400.0)
RBC: 4.31 Mil/uL (ref 3.87–5.11)
RDW: 14.8 % — ABNORMAL HIGH (ref 11.5–14.6)
WBC: 5.9 10*3/uL (ref 4.5–10.5)

## 2011-06-28 LAB — URINALYSIS, ROUTINE W REFLEX MICROSCOPIC
Bilirubin Urine: NEGATIVE
Hgb urine dipstick: NEGATIVE
Ketones, ur: NEGATIVE
Leukocytes, UA: NEGATIVE
Nitrite: NEGATIVE
Specific Gravity, Urine: 1.025 (ref 1.000–1.030)
Total Protein, Urine: NEGATIVE
Urine Glucose: NEGATIVE
Urobilinogen, UA: 0.2 (ref 0.0–1.0)
pH: 6 (ref 5.0–8.0)

## 2011-06-28 LAB — HEMOGLOBIN A1C: Hgb A1c MFr Bld: 6.5 % (ref 4.6–6.5)

## 2011-06-28 LAB — HEPATIC FUNCTION PANEL
ALT: 27 U/L (ref 0–35)
AST: 26 U/L (ref 0–37)
Albumin: 4.2 g/dL (ref 3.5–5.2)
Alkaline Phosphatase: 102 U/L (ref 39–117)
Bilirubin, Direct: 0.1 mg/dL (ref 0.0–0.3)
Total Bilirubin: 0.9 mg/dL (ref 0.3–1.2)
Total Protein: 7 g/dL (ref 6.0–8.3)

## 2011-06-28 LAB — LIPASE: Lipase: 34 U/L (ref 11.0–59.0)

## 2011-06-28 NOTE — Assessment & Plan Note (Signed)
Mod to severe, nonradicular but given the length of time present , pain getting worse overall, and hx of fall x 2 will ask for lumbar MRI

## 2011-06-28 NOTE — Patient Instructions (Signed)
Continue all other medications as before Please have the pharmacy call with any refills you may need. Please go to LAB in the Basement for the blood and/or urine tests to be done today You will be contacted regarding the referral for: MRI for the lower back, the abdomen ultrasound for the gallbladder, and general surgury referral

## 2011-06-28 NOTE — Assessment & Plan Note (Signed)
Has some tenderness of the right costal margin, but o/w typical for GB - for updated labs, abd u/s and refer gen surgury

## 2011-06-28 NOTE — Assessment & Plan Note (Signed)
Only re-started the PPI yesterday, for anti-reflux diet, avoid overeating, and cont same PPI

## 2011-06-29 ENCOUNTER — Other Ambulatory Visit: Payer: Self-pay | Admitting: Internal Medicine

## 2011-06-29 ENCOUNTER — Encounter: Payer: Self-pay | Admitting: Internal Medicine

## 2011-06-29 LAB — BASIC METABOLIC PANEL
BUN: 13 mg/dL (ref 6–23)
CO2: 30 mEq/L (ref 19–32)
Calcium: 9.2 mg/dL (ref 8.4–10.5)
Chloride: 102 mEq/L (ref 96–112)
Creatinine, Ser: 0.8 mg/dL (ref 0.4–1.2)
GFR: 99.27 mL/min (ref >60.00)
Glucose, Bld: 105 mg/dL — ABNORMAL HIGH (ref 70–99)
Potassium: 3.1 mEq/L — ABNORMAL LOW (ref 3.5–5.1)
Sodium: 141 mEq/L (ref 135–145)

## 2011-06-29 MED ORDER — POTASSIUM CHLORIDE ER 10 MEQ PO TBCR: 10.0000 meq | EXTENDED_RELEASE_TABLET | Freq: Every day | ORAL | Status: DC

## 2011-07-01 ENCOUNTER — Encounter: Payer: Self-pay | Admitting: Internal Medicine

## 2011-07-01 NOTE — Assessment & Plan Note (Signed)
stable overall by hx and exam, most recent data reviewed with pt, and pt to continue medical treatment as before Lab Results  Component Value Date   HGBA1C 6.5 06/28/2011

## 2011-07-01 NOTE — Progress Notes (Signed)
Subjective:    Patient ID: Brandi Shaffer, female    DOB: 1945/05/09, 66 y.o.   MRN: [id number]  HPI  Here with c/o 2 months gradually worsening dull lower back pain at the lower lumbar level, located "across the back" but not radicular to the buttocks or LE's;  Pt denies bowel or bladder change, fever, wt loss,  worsening LE pain/numbness/weakness, gait change but has fallen twice (the last just last wk) with legs "just going out." Pt denies chest pain, increased sob or doe, wheezing, orthopnea, PND, increased LE swelling, palpitations, dizziness or syncope.   Pt denies polydipsia, polyuria.  Pt denies new neurological symptoms such as new headache, or facial or extremity weakness or numbness.   Pt denies fever, wt loss, night sweats, loss of appetite, or other constitutional symptoms  Does have increased reflux recently but did re-start the PPI yesterday, Denies dysphagia, abd pain, n/v, bowel change or blood.  BP elevated today did not take BP meds today.    Also with recurrent RUQ pain/tender for several wks without n/v, bowel change but seems to radiate to the right back and worse with eating, as well as bending forward.  Past Medical History  Diagnosis Date  . ANEMIA-IRON DEFICIENCY 12/06/2009  . DIABETES MELLITUS, TYPE II 09/17/2006  . DIVERTICULOSIS, COLON 12/06/2009  . GENITAL HERPES 03/24/2010  . HYPERLIPIDEMIA 03/24/2010  . HYPERTENSION 09/17/2006  . HYPOTHYROIDISM 09/17/2006  . LIPOMA 12/06/2009  . PERIPHERAL EDEMA 03/24/2010  . GERD (gastroesophageal reflux disease) 06/28/2011   Past Surgical History  Procedure Date  . Tubal ligation   . Abdominal hysterectomy     fibroids  . Ankle surgury     x 3 left - chronic pain/swelling  . Tonsillectomy     reports that she has never smoked. She does not have any smokeless tobacco history on file. She reports that she does not drink alcohol or use illicit drugs. family history includes Diabetes in her other; Heart disease in her sister;  Hypertension in her father and mother; Joint hypermobility in her other; Prostate cancer in her brother; and Stroke in her sister. Allergies  Allergen Reactions  . Januvia (Sitagliptin Phosphate)     dizziness  . Metformin And Related Nausea Only   Review of Systems Review of Systems  Constitutional: Negative for diaphoresis and unexpected weight change.  HENT: Negative for drooling and tinnitus.   Eyes: Negative for photophobia and visual disturbance.  Respiratory: Negative for choking and stridor.   Gastrointestinal: Negative for vomiting and blood in stool.  Genitourinary: Negative for hematuria and decreased urine volume.  Musculoskeletal: Negative for gait problem.  Skin: Negative for color change and wound.    Objective:   Physical Exam BP 152/88  Pulse 78  Temp(Src) 97.5 F (36.4 C) (Oral)  Ht 4\' 11"  (1.499 m)  Wt 194 lb 8 oz (88.225 kg)  BMI 39.28 kg/m2  SpO2 98% Physical Exam  VS noted Constitutional: Pt appears well-developed and well-nourished.  HENT: Head: Normocephalic.  Right Ear: External ear normal.  Left Ear: External ear normal.  Eyes: Conjunctivae and EOM are normal. Pupils are equal, round, and reactive to light.  Neck: Normal range of motion. Neck supple.  Cardiovascular: Normal rate and regular rhythm.   Pulmonary/Chest: Effort normal and breath sounds normal.  Abd:  Soft, NT, non-distended, + BS except for mild tender RUQ/right costal margin, no guarding or rebound Spine nontender except for lower mid lumbar about L4-5 but no swelling or erythema, no rash  Neurological: Pt is alert. Not confused, cn 2-12 intact, motor/sens/dtr intact Skin: Skin is warm. No erythema.  Psychiatric: Pt behavior is normal. Thought content normal.     Assessment & Plan:

## 2011-07-01 NOTE — Assessment & Plan Note (Signed)
stable overall by hx and exam, most recent data reviewed with pt, and pt to continue medical treatment as before - to re-start meds BP Readings from Last 3 Encounters:  06/28/11 152/88  04/17/11 120/80  11/21/10 130/90

## 2011-07-04 ENCOUNTER — Telehealth: Payer: Self-pay

## 2011-07-04 ENCOUNTER — Ambulatory Visit
Admission: RE | Admit: 2011-07-04 | Discharge: 2011-07-04 | Disposition: A | Discharge status: Home / Self Care | Payer: Medicare Other | Source: Ambulatory Visit | Attending: Internal Medicine | Admitting: Internal Medicine

## 2011-07-04 DIAGNOSIS — R1011 Right upper quadrant pain: Secondary | ICD-10-CM

## 2011-07-04 NOTE — Telephone Encounter (Signed)
[facility] Call report on Abdominal Ultrasound. No Gall stones. No gallbladder wall thickening. No peri-colisistic fluid. Fatty Liver. 1.9 ml complex right upper pole renal cyst. Pancreas partially visualized.

## 2011-07-05 ENCOUNTER — Encounter: Payer: Self-pay | Admitting: Internal Medicine

## 2011-07-05 DIAGNOSIS — N281 Cyst of kidney, acquired: Secondary | ICD-10-CM

## 2011-07-05 HISTORY — DX: Cyst of kidney, acquired: N28.1

## 2011-07-20 ENCOUNTER — Ambulatory Visit (INDEPENDENT_AMBULATORY_CARE_PROVIDER_SITE_OTHER): Payer: Medicare Other | Admitting: Surgery

## 2011-10-16 ENCOUNTER — Telehealth: Payer: Self-pay

## 2011-10-16 NOTE — Telephone Encounter (Signed)
Pt came into clinic stating she received an injection meant for another patient at [facility] of Depomedrol 40 mg and Toradol 30 mg. Pt is requesting MD advisement of any possible adverse reactions to injections. Pt was also prescribed Celebrex 200 mg QD and is requesting MD advisement on possible interactions with her current medications. Please advise.

## 2011-10-16 NOTE — Telephone Encounter (Signed)
There can be mild increase of blood sugars of about 25-50 pts over a few days, then back to usual levels  Also, although unusual, the kidney function can be slowed temporarily by the toradol  I am not sure she absolutely has to check, but if she wants - OK for BMET -  995.8

## 2011-10-17 ENCOUNTER — Other Ambulatory Visit (INDEPENDENT_AMBULATORY_CARE_PROVIDER_SITE_OTHER): Payer: Medicare Other

## 2011-10-17 ENCOUNTER — Telehealth: Payer: Self-pay | Admitting: Internal Medicine

## 2011-10-17 DIAGNOSIS — I10 Essential (primary) hypertension: Secondary | ICD-10-CM

## 2011-10-17 LAB — BASIC METABOLIC PANEL
BUN: 11 mg/dL (ref 6–23)
CO2: 27 mEq/L (ref 19–32)
Calcium: 9 mg/dL (ref 8.4–10.5)
Chloride: 104 mEq/L (ref 96–112)
Creatinine, Ser: 0.9 mg/dL (ref 0.4–1.2)
GFR: 81.4 mL/min (ref >60.00)
Glucose, Bld: 136 mg/dL — ABNORMAL HIGH (ref 70–99)
Potassium: 4.1 mEq/L (ref 3.5–5.1)
Sodium: 140 mEq/L (ref 135–145)

## 2011-10-17 NOTE — Telephone Encounter (Signed)
Put order in to the lab for bmet.

## 2011-10-17 NOTE — Telephone Encounter (Signed)
I changed to 401.1

## 2011-10-17 NOTE — Telephone Encounter (Signed)
Called the patient informed of MD instructions and lab order. Put order in to the lab.

## 2011-10-17 NOTE — Telephone Encounter (Signed)
Message copied by Corwin Levins on Wed Oct 17, 2011  9:17 AM ------      Message from: Scharlene Gloss B      Created: Wed Oct 17, 2011  8:08 AM       Tried to order BMET for this patient but diagnosis code 995.8 would not work please advise

## 2011-10-18 ENCOUNTER — Encounter: Payer: Self-pay | Admitting: Internal Medicine

## 2011-10-26 ENCOUNTER — Other Ambulatory Visit: Payer: Self-pay

## 2011-10-26 MED ORDER — HYDROCODONE-ACETAMINOPHEN 7.5-750 MG PO TABS: 1.0000 | ORAL_TABLET | Freq: Every day | ORAL | Status: DC | PRN

## 2011-10-26 NOTE — Telephone Encounter (Signed)
Faxed hardcopy to pharmacy. 

## 2011-10-26 NOTE — Telephone Encounter (Signed)
Done hardcopy to robin  

## 2011-12-05 ENCOUNTER — Encounter: Payer: Self-pay | Admitting: Internal Medicine

## 2011-12-05 ENCOUNTER — Other Ambulatory Visit (INDEPENDENT_AMBULATORY_CARE_PROVIDER_SITE_OTHER): Payer: Medicare Other

## 2011-12-05 ENCOUNTER — Ambulatory Visit (INDEPENDENT_AMBULATORY_CARE_PROVIDER_SITE_OTHER): Payer: Medicare Other | Admitting: Internal Medicine

## 2011-12-05 ENCOUNTER — Telehealth: Payer: Self-pay

## 2011-12-05 VITALS — BP 108/78 | HR 85 | Temp 97.3°F | Ht 59.0 in | Wt 193.5 lb

## 2011-12-05 DIAGNOSIS — Z136 Encounter for screening for cardiovascular disorders: Secondary | ICD-10-CM

## 2011-12-05 DIAGNOSIS — I350 Nonrheumatic aortic (valve) stenosis: Secondary | ICD-10-CM | POA: Insufficient documentation

## 2011-12-05 DIAGNOSIS — E119 Type 2 diabetes mellitus without complications: Secondary | ICD-10-CM

## 2011-12-05 DIAGNOSIS — J45909 Unspecified asthma, uncomplicated: Secondary | ICD-10-CM | POA: Insufficient documentation

## 2011-12-05 DIAGNOSIS — R011 Cardiac murmur, unspecified: Secondary | ICD-10-CM

## 2011-12-05 DIAGNOSIS — Z23 Encounter for immunization: Secondary | ICD-10-CM

## 2011-12-05 DIAGNOSIS — Z79899 Other long term (current) drug therapy: Secondary | ICD-10-CM

## 2011-12-05 DIAGNOSIS — Z0000 Encounter for general adult medical examination without abnormal findings: Secondary | ICD-10-CM

## 2011-12-05 HISTORY — DX: Unspecified asthma, uncomplicated: J45.909

## 2011-12-05 LAB — URINALYSIS, ROUTINE W REFLEX MICROSCOPIC
Bilirubin Urine: NEGATIVE
Hgb urine dipstick: NEGATIVE
Ketones, ur: NEGATIVE
Leukocytes, UA: NEGATIVE
Nitrite: NEGATIVE
Specific Gravity, Urine: 1.025 (ref 1.000–1.030)
Total Protein, Urine: NEGATIVE
Urine Glucose: NEGATIVE
Urobilinogen, UA: 0.2 (ref 0.0–1.0)
pH: 5.5 (ref 5.0–8.0)

## 2011-12-05 LAB — BASIC METABOLIC PANEL
BUN: 18 mg/dL (ref 6–23)
CO2: 29 mEq/L (ref 19–32)
Calcium: 9.1 mg/dL (ref 8.4–10.5)
Chloride: 106 mEq/L (ref 96–112)
Creatinine, Ser: 0.9 mg/dL (ref 0.4–1.2)
GFR: 85.8 mL/min (ref >60.00)
Glucose, Bld: 76 mg/dL (ref 70–99)
Potassium: 3.9 mEq/L (ref 3.5–5.1)
Sodium: 141 mEq/L (ref 135–145)

## 2011-12-05 LAB — CBC WITH DIFFERENTIAL/PLATELET
Basophils Absolute: 0.1 10*3/uL (ref 0.0–0.1)
Basophils Relative: 0.8 % (ref 0.0–3.0)
Eosinophils Absolute: 0.3 10*3/uL (ref 0.0–0.7)
Eosinophils Relative: 3.7 % (ref 0.0–5.0)
HCT: 38.7 % (ref 36.0–46.0)
Hemoglobin: 12.6 g/dL (ref 12.0–15.0)
Lymphocytes Relative: 38.8 % (ref 12.0–46.0)
Lymphs Abs: 2.9 10*3/uL (ref 0.7–4.0)
MCHC: 32.5 g/dL (ref 30.0–36.0)
MCV: 92 fl (ref 78.0–100.0)
Monocytes Absolute: 0.7 10*3/uL (ref 0.1–1.0)
Monocytes Relative: 8.9 % (ref 3.0–12.0)
Neutro Abs: 3.5 10*3/uL (ref 1.4–7.7)
Neutrophils Relative %: 47.8 % (ref 43.0–77.0)
Platelets: 270 10*3/uL (ref 150.0–400.0)
RBC: 4.2 Mil/uL (ref 3.87–5.11)
RDW: 14.2 % (ref 11.5–14.6)
WBC: 7.4 10*3/uL (ref 4.5–10.5)

## 2011-12-05 LAB — HEPATIC FUNCTION PANEL
ALT: 18 U/L (ref 0–35)
AST: 19 U/L (ref 0–37)
Albumin: 3.7 g/dL (ref 3.5–5.2)
Alkaline Phosphatase: 106 U/L (ref 39–117)
Bilirubin, Direct: 0.1 mg/dL (ref 0.0–0.3)
Total Bilirubin: 0.6 mg/dL (ref 0.3–1.2)
Total Protein: 6.7 g/dL (ref 6.0–8.3)

## 2011-12-05 LAB — LIPID PANEL
Cholesterol: 181 mg/dL (ref 0–200)
HDL: 53.9 mg/dL (ref >39.00)
LDL Cholesterol: 111 mg/dL — ABNORMAL HIGH (ref 0–99)
Total CHOL/HDL Ratio: 3
Triglycerides: 82 mg/dL (ref 0.0–149.0)
VLDL: 16.4 mg/dL (ref 0.0–40.0)

## 2011-12-05 LAB — MICROALBUMIN / CREATININE URINE RATIO
Creatinine,U: 109.7 mg/dL
Microalb Creat Ratio: 0.5 mg/g (ref 0.0–30.0)
Microalb, Ur: 0.5 mg/dL (ref 0.0–1.9)

## 2011-12-05 LAB — TSH: TSH: 1.88 u[IU]/mL (ref 0.35–5.50)

## 2011-12-05 LAB — HEMOGLOBIN A1C: Hgb A1c MFr Bld: 6.5 % (ref 4.6–6.5)

## 2011-12-05 MED ORDER — POTASSIUM CHLORIDE ER 10 MEQ PO TBCR: 10.0000 meq | EXTENDED_RELEASE_TABLET | Freq: Every day | ORAL | Status: DC

## 2011-12-05 MED ORDER — ALBUTEROL SULFATE HFA 108 (90 BASE) MCG/ACT IN AERS: 2.0000 | INHALATION_SPRAY | Freq: Four times a day (QID) | RESPIRATORY_TRACT | Status: DC | PRN

## 2011-12-05 MED ORDER — AMLODIPINE BESYLATE 5 MG PO TABS: 5.0000 mg | ORAL_TABLET | Freq: Every day | ORAL | Status: DC

## 2011-12-05 MED ORDER — OMEPRAZOLE 40 MG PO CPDR: 40.0000 mg | DELAYED_RELEASE_CAPSULE | Freq: Every day | ORAL | Status: DC

## 2011-12-05 MED ORDER — IRBESARTAN-HYDROCHLOROTHIAZIDE 300-25 MG PO TABS: 1.0000 | ORAL_TABLET | Freq: Every day | ORAL | Status: DC

## 2011-12-05 MED ORDER — IRBESARTAN-HYDROCHLOROTHIAZIDE 300-12.5 MG PO TABS: 1.0000 | ORAL_TABLET | Freq: Every day | ORAL | Status: DC

## 2011-12-05 MED ORDER — ASPIRIN 81 MG PO TBEC: 81.0000 mg | DELAYED_RELEASE_TABLET | Freq: Every day | ORAL | Status: DC

## 2011-12-05 NOTE — Assessment & Plan Note (Signed)

## 2011-12-05 NOTE — Progress Notes (Signed)
Subjective:    Patient ID: Brandi Shaffer, female    DOB: 1945/09/30, 66 y.o.   MRN: [id number]  HPI  Here for wellness and f/u;  Overall doing ok;  Pt denies CP, worsening SOB, DOE, wheezing, orthopnea, PND, worsening LE edema, palpitations, dizziness or syncope.  Pt denies neurological change such as new Headache, facial or extremity weakness.  Pt denies polydipsia, polyuria, or low sugar symptoms. Pt states overall good compliance with treatment and medications, good tolerability, and trying to follow lower cholesterol diet.  Pt denies worsening depressive symptoms, suicidal ideation or panic. No fever, wt loss, night sweats, loss of appetite, or other constitutional symptoms.  Pt states good ability with ADL's, low fall risk, home safety reviewed and adequate, no significant changes in hearing or vision, and occasionally active with exercise.  Still takes the hydrocodone prn for the chronic left distal leg/ankle pain s/p ortho surgury with rods. Did see ortho for right foot pain, but describes being given a pain shot and steroid shot meant for another pt accidentally , with 1 wk mild increased sugar and BP.   Saw a lawyer who indicated she probably didn't have a good case. For flu shot.  Has itchiness to left sole, no pain./red/swelling Past Medical History  Diagnosis Date  . ANEMIA-IRON DEFICIENCY 12/06/2009  . DIABETES MELLITUS, TYPE II 09/17/2006  . DIVERTICULOSIS, COLON 12/06/2009  . GENITAL HERPES 03/24/2010  . HYPERLIPIDEMIA 03/24/2010  . HYPERTENSION 09/17/2006  . HYPOTHYROIDISM 09/17/2006  . LIPOMA 12/06/2009  . PERIPHERAL EDEMA 03/24/2010  . GERD (gastroesophageal reflux disease) 06/28/2011  . Renal cyst 07/05/2011    1.9 cm minimally complex  . Asthma 12/05/2011   Past Surgical History  Procedure Date  . Tubal ligation   . Abdominal hysterectomy     fibroids  . Ankle surgury     x 3 left - chronic pain/swelling  . Tonsillectomy     reports that she has never smoked. She does not  have any smokeless tobacco history on file. She reports that she does not drink alcohol or use illicit drugs. family history includes Diabetes in her other; Heart disease in her sister; Hypertension in her father and mother; Joint hypermobility in her other; Prostate cancer in her brother; and Stroke in her sister. Allergies  Allergen Reactions  . Januvia (Sitagliptin Phosphate)     dizziness  . Metformin And Related Nausea Only   Current Outpatient Prescriptions on File Prior to Visit  Medication Sig Dispense Refill  . amLODipine (NORVASC) 5 MG tablet Take 1 tablet (5 mg total) by mouth daily.  90 tablet  3  . Ascorbic Acid (VITAMIN C) 500 MG tablet Take 500 mg by mouth daily.        . Cholecalciferol (VITAMIN D) 400 UNITS tablet Take 400 Units by mouth daily.        . Coenzyme Q10 (COQ10) 100 MG capsule Take 100 mg by mouth daily.        Marland Kitchen HYDROcodone-acetaminophen (VICODIN ES) 7.5-750 MG per tablet Take 1 tablet by mouth daily as needed. For pain  30 tablet  5  . naproxen sodium (ANAPROX) 220 MG tablet Take 220 mg by mouth daily as needed.        Marland Kitchen omeprazole (PRILOSEC) 40 MG capsule Take 1 capsule (40 mg total) by mouth daily.  90 capsule  3  . potassium chloride (KLOR-CON 10) 10 MEQ tablet Take 1 tablet (10 mEq total) by mouth daily.  90 tablet  3  .  vitamin B-12 (CYANOCOBALAMIN) 1000 MCG tablet Take 1,000 mcg by mouth daily.        . vitamin E 400 UNIT capsule Take 400 Units by mouth daily.        Marland Kitchen albuterol (PROVENTIL HFA;VENTOLIN HFA) 108 (90 BASE) MCG/ACT inhaler Inhale 2 puffs into the lungs every 6 (six) hours as needed for wheezing.  1 Inhaler  11  . pioglitazone (ACTOS) 15 MG tablet Take 1 tablet (15 mg total) by mouth daily.  90 tablet  3  . pravastatin (PRAVACHOL) 40 MG tablet Take 1 tablet (40 mg total) by mouth every evening.  90 tablet  3   Review of Systems Review of Systems  Constitutional: Negative for diaphoresis, activity change, appetite change and unexpected weight  change.  HENT: Negative for hearing loss, ear pain, facial swelling, mouth sores and neck stiffness.   Eyes: Negative for pain, redness and visual disturbance.  Respiratory: Negative for shortness of breath and wheezing.   Cardiovascular: Negative for chest pain and palpitations.  Gastrointestinal: Negative for diarrhea, blood in stool, abdominal distention and rectal pain.  Genitourinary: Negative for hematuria, flank pain and decreased urine volume.  Musculoskeletal: Negative for myalgias and joint swelling.  Skin: Negative for color change and wound.  Neurological: Negative for syncope and numbness.  Hematological: Negative for adenopathy.  Psychiatric/Behavioral: Negative for hallucinations, self-injury, decreased concentration and agitation.      Objective:   Physical Exam BP 108/78  Pulse 85  Temp 97.3 F (36.3 C) (Oral)  Ht 4\' 11"  (1.499 m)  Wt 193 lb 8 oz (87.771 kg)  BMI 39.08 kg/m2  SpO2 98% Physical Exam  VS noted Constitutional: Pt is oriented to person, place, and time. Appears well-developed and well-nourished.  HENT:  Head: Normocephalic and atraumatic.  Right Ear: External ear normal.  Left Ear: External ear normal.  Nose: Nose normal.  Mouth/Throat: Oropharynx is clear and moist.  Eyes: Conjunctivae and EOM are normal. Pupils are equal, round, and reactive to light.  Neck: Normal range of motion. Neck supple. No JVD present. No tracheal deviation present.  Cardiovascular: Normal rate, regular rhythm, normal heart sounds and intact distal pulses. With gr 2/6 sys murmur LUSB  Pulmonary/Chest: Effort normal and breath sounds normal.  Abdominal: Soft. Bowel sounds are normal. There is no tenderness.  Musculoskeletal: Normal range of motion. Exhibits no edema.  Lymphadenopathy:  Has no cervical adenopathy.  Neurological: Pt is alert and oriented to person, place, and time. Pt has normal reflexes. No cranial nerve deficit.  Skin: Skin is warm and dry. No rash noted.  even to left sole Psychiatric:  Has  normal mood and affect. Behavior is normal.     Assessment & Plan:

## 2011-12-05 NOTE — Telephone Encounter (Signed)
rx corrected 

## 2011-12-05 NOTE — Telephone Encounter (Signed)
Fax from pharmacy informing Avalide only comes in 300-12.5mg , is this ok. Please advise

## 2011-12-05 NOTE — Patient Instructions (Addendum)
You had the flu shot today Please remember to followup with your GYN for the yearly pap smear and/or mammogram Continue all other medications as before; all of your medications were refilled to the pharmacy today, including the inhaler Please have the pharmacy call with any other refills you may need. Please start the Aspirin 81 mg - 1 per day - but Enteric Coated only You can also use Caladryl lotion for the itchy area to the sole of the left foot You will be contacted regarding the referral for: Echocardiogram for the heart murmur Please go to LAB in the Basement for the blood and/or urine tests to be done today You will be contacted by phone if any changes need to be made immediately.  Otherwise, you will receive a letter about your results with an explanation. Please remember to sign up for My Chart at your earliest convenience, as this will be important to you in the future with finding out test results. Please return in 6 mo with Lab testing done 3-5 days before

## 2011-12-06 ENCOUNTER — Encounter: Payer: Self-pay | Admitting: Internal Medicine

## 2011-12-06 NOTE — Assessment & Plan Note (Signed)
Lab Results  Component Value Date   HGBA1C 6.5 12/05/2011

## 2011-12-06 NOTE — Assessment & Plan Note (Signed)
For 2d echo

## 2011-12-06 NOTE — Assessment & Plan Note (Signed)
For albut MDI refill

## 2011-12-12 ENCOUNTER — Ambulatory Visit (HOSPITAL_COMMUNITY): Payer: Medicare Other | Attending: Cardiology | Admitting: Radiology

## 2011-12-12 ENCOUNTER — Other Ambulatory Visit: Payer: Self-pay

## 2011-12-12 DIAGNOSIS — E119 Type 2 diabetes mellitus without complications: Secondary | ICD-10-CM | POA: Insufficient documentation

## 2011-12-12 DIAGNOSIS — I369 Nonrheumatic tricuspid valve disorder, unspecified: Secondary | ICD-10-CM | POA: Insufficient documentation

## 2011-12-12 DIAGNOSIS — I10 Essential (primary) hypertension: Secondary | ICD-10-CM | POA: Insufficient documentation

## 2011-12-12 DIAGNOSIS — R011 Cardiac murmur, unspecified: Secondary | ICD-10-CM

## 2011-12-12 DIAGNOSIS — I379 Nonrheumatic pulmonary valve disorder, unspecified: Secondary | ICD-10-CM | POA: Insufficient documentation

## 2011-12-12 DIAGNOSIS — I080 Rheumatic disorders of both mitral and aortic valves: Secondary | ICD-10-CM | POA: Insufficient documentation

## 2011-12-13 ENCOUNTER — Encounter: Payer: Self-pay | Admitting: Internal Medicine

## 2012-02-01 ENCOUNTER — Other Ambulatory Visit: Payer: Self-pay | Admitting: Internal Medicine

## 2012-03-01 ENCOUNTER — Ambulatory Visit (INDEPENDENT_AMBULATORY_CARE_PROVIDER_SITE_OTHER): Payer: Medicare Other | Admitting: Family Medicine

## 2012-03-01 ENCOUNTER — Encounter: Payer: Self-pay | Admitting: Family Medicine

## 2012-03-01 VITALS — BP 130/86 | HR 108 | Temp 98.4°F | Wt 190.0 lb

## 2012-03-01 DIAGNOSIS — J40 Bronchitis, not specified as acute or chronic: Secondary | ICD-10-CM

## 2012-03-01 MED ORDER — AZITHROMYCIN 250 MG PO TABS: ORAL_TABLET | ORAL | Status: DC

## 2012-03-01 NOTE — Progress Notes (Signed)
  Subjective:     Brandi Shaffer is a 67 y.o. female here for evaluation of a cough. Onset of symptoms was 3 days ago. Symptoms have been gradually worsening since that time. The cough is productive and is aggravated by cold air, infection and reclining position. Associated symptoms include: chills, postnasal drip, shortness of breath, sputum production and wheezing. Patient does have a history of asthma. Patient does have a history of environmental allergens. Patient has not traveled recently. Patient does not have a history of smoking. Patient has not had a previous chest x-ray. Patient has not had a PPD done.  The following portions of the patient's history were reviewed and updated as appropriate: allergies, current medications, past family history, past medical history, past social history, past surgical history and problem list.  Review of Systems Pertinent items are noted in HPI.    Objective:    Oxygen saturation 96% on room air BP 130/86  Pulse 108  Temp 98.4 F (36.9 C) (Oral)  Wt 190 lb (86.183 kg)  SpO2 96% General appearance: alert, cooperative, appears stated age and mild distress Ears: normal TM and external ear canal right ear Nose: green discharge, moderate congestion, turbinates red, swollen, sinus tenderness bilateral Throat: lips, mucosa, and tongue normal; teeth and gums normal Neck: mild anterior cervical adenopathy, supple, symmetrical, trachea midline and thyroid not enlarged, symmetric, no tenderness/mass/nodules Lungs: diminished breath sounds bilaterally Heart: S1, S2 normal    Assessment:    Acute Bronchitis    Plan:    Antibiotics per medication orders. Antitussives per medication orders. Avoid exposure to tobacco smoke and fumes. B-agonist inhaler. Call if shortness of breath worsens, blood in sputum, change in character of cough, development of fever or chills, inability to maintain nutrition and hydration. Avoid exposure to tobacco smoke and  fumes. Trial of antihistamines.

## 2012-03-01 NOTE — Patient Instructions (Signed)

## 2012-06-04 ENCOUNTER — Ambulatory Visit (INDEPENDENT_AMBULATORY_CARE_PROVIDER_SITE_OTHER): Payer: Medicare Other | Admitting: Internal Medicine

## 2012-06-04 ENCOUNTER — Encounter: Payer: Self-pay | Admitting: Internal Medicine

## 2012-06-04 ENCOUNTER — Other Ambulatory Visit (INDEPENDENT_AMBULATORY_CARE_PROVIDER_SITE_OTHER): Payer: Medicare Other

## 2012-06-04 VITALS — BP 132/84 | HR 72 | Temp 97.3°F | Ht 59.0 in | Wt 185.1 lb

## 2012-06-04 DIAGNOSIS — E119 Type 2 diabetes mellitus without complications: Secondary | ICD-10-CM

## 2012-06-04 DIAGNOSIS — I350 Nonrheumatic aortic (valve) stenosis: Secondary | ICD-10-CM

## 2012-06-04 DIAGNOSIS — I10 Essential (primary) hypertension: Secondary | ICD-10-CM

## 2012-06-04 DIAGNOSIS — E785 Hyperlipidemia, unspecified: Secondary | ICD-10-CM

## 2012-06-04 DIAGNOSIS — I359 Nonrheumatic aortic valve disorder, unspecified: Secondary | ICD-10-CM

## 2012-06-04 DIAGNOSIS — Z0000 Encounter for general adult medical examination without abnormal findings: Secondary | ICD-10-CM

## 2012-06-04 DIAGNOSIS — M25819 Other specified joint disorders, unspecified shoulder: Secondary | ICD-10-CM

## 2012-06-04 DIAGNOSIS — M7542 Impingement syndrome of left shoulder: Secondary | ICD-10-CM

## 2012-06-04 LAB — HEMOGLOBIN A1C: Hgb A1c MFr Bld: 6.1 % (ref 4.6–6.5)

## 2012-06-04 LAB — BASIC METABOLIC PANEL
BUN: 17 mg/dL (ref 6–23)
CO2: 30 mEq/L (ref 19–32)
Calcium: 9.1 mg/dL (ref 8.4–10.5)
Chloride: 104 mEq/L (ref 96–112)
Creatinine, Ser: 0.8 mg/dL (ref 0.4–1.2)
GFR: 97.49 mL/min (ref >60.00)
Glucose, Bld: 84 mg/dL (ref 70–99)
Potassium: 3.9 mEq/L (ref 3.5–5.1)
Sodium: 141 mEq/L (ref 135–145)

## 2012-06-04 LAB — LIPID PANEL
Cholesterol: 177 mg/dL (ref 0–200)
HDL: 58.7 mg/dL (ref >39.00)
LDL Cholesterol: 106 mg/dL — ABNORMAL HIGH (ref 0–99)
Total CHOL/HDL Ratio: 3
Triglycerides: 63 mg/dL (ref 0.0–149.0)
VLDL: 12.6 mg/dL (ref 0.0–40.0)

## 2012-06-04 LAB — HEPATIC FUNCTION PANEL
ALT: 17 U/L (ref 0–35)
AST: 19 U/L (ref 0–37)
Albumin: 3.8 g/dL (ref 3.5–5.2)
Alkaline Phosphatase: 103 U/L (ref 39–117)
Bilirubin, Direct: 0.1 mg/dL (ref 0.0–0.3)
Total Bilirubin: 0.7 mg/dL (ref 0.3–1.2)
Total Protein: 6.9 g/dL (ref 6.0–8.3)

## 2012-06-04 MED ORDER — IBUPROFEN 400 MG PO TABS: 400.0000 mg | ORAL_TABLET | Freq: Four times a day (QID) | ORAL | Status: DC | PRN

## 2012-06-04 NOTE — Assessment & Plan Note (Signed)
stable overall by history and exam, recent data reviewed with pt, and pt to continue medical treatment as before,  to f/u any worsening symptoms or concerns Lab Results  Component Value Date   LDLCALC 106* 06/04/2012

## 2012-06-04 NOTE — Assessment & Plan Note (Signed)
stable overall by history and exam, recent data reviewed with pt - echocardiogram oct 2013, and pt to continue medical treatment as before,  to f/u any worsening symptoms or concerns

## 2012-06-04 NOTE — Assessment & Plan Note (Signed)
stable overall by history and exam, recent data reviewed with pt, and pt to continue medical treatment as before,  to f/u any worsening symptoms or concerns BP Readings from Last 3 Encounters:  06/04/12 132/84  03/01/12 130/86  12/05/11 108/78

## 2012-06-04 NOTE — Patient Instructions (Signed)
Please take all new medication as prescribed - the ibuprofen You will be contacted regarding the referral for: orthopedic Please continue all other medications as before, and refills have been done if requested. Please have the pharmacy call with any other refills you may need. Please go to the LAB in the Basement (turn left off the elevator) for the tests to be done today You will be contacted by phone if any changes need to be made immediately.  Otherwise, you will receive a letter about your results with an explanation, but please check with MyChart first. Thank you for enrolling in MyChart. Please follow the instructions below to securely access your online medical record. MyChart allows you to send messages to your doctor, view your test results, renew your prescriptions, schedule appointments, and more. To Log into My Chart online, please go by [employer] or [employer] to [email or web address], or download the MyChart App from the [employer] of [employer] .  Your Username is: [email or web address] (pass malibu) Please send a Engineer, water on Mychart later today. Please return in 6 months, or sooner if needed, with Lab testing done 3-5 days before

## 2012-06-04 NOTE — Progress Notes (Signed)
Subjective:    Patient ID: Brandi Shaffer, female    DOB: [birth year], 67 y.o.   MRN: [id number]  HPI  Here to f/u; overall doing ok,  Pt denies chest pain, increased sob or doe, wheezing, orthopnea, PND, increased LE swelling, palpitations, dizziness or syncope.  Pt denies polydipsia, polyuria, or low sugar symptoms such as weakness or confusion improved with po intake.  Pt denies new neurological symptoms such as new headache, or facial or extremity weakness or numbness.   Pt states overall good compliance with meds, has been trying to follow lower cholesterol, diabetic diet, with wt overall stable,  but little exercise however.  Moved recently with more lifting, now with 2 mo persistent left shoulder pain, worse to abduct with pain worst at the bursa area it seems, better with heat and naproxen and ibuprofen; worse to use more, no signifcant neck or other arm pain. Past Medical History  Diagnosis Date  . ANEMIA-IRON DEFICIENCY 12/06/2009  . DIABETES MELLITUS, TYPE II 09/17/2006  . DIVERTICULOSIS, COLON 12/06/2009  . GENITAL HERPES 03/24/2010  . HYPERLIPIDEMIA 03/24/2010  . HYPERTENSION 09/17/2006  . HYPOTHYROIDISM 09/17/2006  . LIPOMA 12/06/2009  . PERIPHERAL EDEMA 03/24/2010  . GERD (gastroesophageal reflux disease) 06/28/2011  . Renal cyst 07/05/2011    1.9 cm minimally complex  . Asthma 12/05/2011   Past Surgical History  Procedure Laterality Date  . Tubal ligation    . Abdominal hysterectomy      fibroids  . Ankle surgury      x 3 left - chronic pain/swelling  . Tonsillectomy      reports that she has never smoked. She does not have any smokeless tobacco history on file. She reports that she does not drink alcohol or use illicit drugs. family history includes Diabetes in her other; Heart disease in her sister; Hypertension in her father and mother; Joint hypermobility in her other; Prostate cancer in her brother; and Stroke in her sister. Allergies  Allergen Reactions  . Januvia  (Sitagliptin Phosphate)     dizziness  . Metformin And Related Nausea Only   Current Outpatient Prescriptions on File Prior to Visit  Medication Sig Dispense Refill  . albuterol (PROVENTIL HFA;VENTOLIN HFA) 108 (90 BASE) MCG/ACT inhaler Inhale 2 puffs into the lungs every 6 (six) hours as needed for wheezing.  1 Inhaler  11  . amLODipine (NORVASC) 5 MG tablet Take 1 tablet (5 mg total) by mouth daily.  90 tablet  3  . Ascorbic Acid (VITAMIN C) 500 MG tablet Take 500 mg by mouth daily.        Marland Kitchen aspirin 81 MG EC tablet Take 1 tablet (81 mg total) by mouth daily. Swallow whole.  30 tablet  12  . Cholecalciferol (VITAMIN D) 400 UNITS tablet Take 400 Units by mouth daily.        . Coenzyme Q10 (COQ10) 100 MG capsule Take 100 mg by mouth daily.        Marland Kitchen HYDROcodone-acetaminophen (VICODIN ES) 7.5-750 MG per tablet Take 1 tablet by mouth daily as needed. For pain  30 tablet  5  . irbesartan-hydrochlorothiazide (AVALIDE) 300-12.5 MG per tablet Take 1 tablet by mouth daily.  90 tablet  3  . naproxen sodium (ANAPROX) 220 MG tablet Take 220 mg by mouth daily as needed.        Marland Kitchen omeprazole (PRILOSEC) 40 MG capsule Take 1 capsule (40 mg total) by mouth daily.  90 capsule  3  . pioglitazone (ACTOS) 15 MG  tablet TAKE 1 TABLET BY MOUTH EVERY DAY  90 tablet  3  . potassium chloride (KLOR-CON 10) 10 MEQ tablet Take 1 tablet (10 mEq total) by mouth daily.  90 tablet  3  . vitamin B-12 (CYANOCOBALAMIN) 1000 MCG tablet Take 1,000 mcg by mouth daily.        . pravastatin (PRAVACHOL) 40 MG tablet Take 1 tablet (40 mg total) by mouth every evening.  90 tablet  3   No current facility-administered medications on file prior to visit.    Review of Systems  Constitutional: Negative for unexpected weight change, or unusual diaphoresis  HENT: Negative for tinnitus.   Eyes: Negative for photophobia and visual disturbance.  Respiratory: Negative for choking and stridor.   Gastrointestinal: Negative for vomiting and  blood in stool.  Genitourinary: Negative for hematuria and decreased urine volume.  Musculoskeletal: Negative for acute joint swelling Skin: Negative for color change and wound.  Neurological: Negative for tremors and numbness other than noted  Psychiatric/Behavioral: Negative for decreased concentration or  hyperactivity.       Objective:   Physical Exam BP 132/84  Pulse 72  Temp(Src) 97.3 F (36.3 C) (Oral)  Ht 4\' 11"  (1.499 m)  Wt 185 lb 2 oz (83.972 kg)  BMI 37.37 kg/m2  SpO2 97% VS noted,  Constitutional: Pt appears well-developed and well-nourished.  HENT: Head: NCAT.  Right Ear: External ear normal.  Left Ear: External ear normal.  Eyes: Conjunctivae and EOM are normal. Pupils are equal, round, and reactive to light.  Neck: Normal range of motion. Neck supple.  Cardiovascular: Normal rate and regular rhythm.   Pulmonary/Chest: Effort normal and breath sounds normal.  Neurological: Pt is alert. Not confused , motor/dtr intact to UE's Left shoulder with pain on active abduction to 100 degrees Skin: Skin is warm. No erythema.  Psychiatric: Pt behavior is normal. Thought content normal.     Assessment & Plan:

## 2012-06-04 NOTE — Assessment & Plan Note (Signed)
Mild to mod worsening, for ortho referral, pain control, likely needs cortisone

## 2012-06-04 NOTE — Assessment & Plan Note (Signed)
stable overall by history and exam, recent data reviewed with pt, and pt to continue medical treatment as before,  to f/u any worsening symptoms or concerns Lab Results  Component Value Date   HGBA1C 6.1 06/04/2012

## 2012-07-08 ENCOUNTER — Other Ambulatory Visit: Payer: Self-pay | Admitting: Internal Medicine

## 2012-10-01 ENCOUNTER — Other Ambulatory Visit: Payer: Self-pay

## 2012-10-02 ENCOUNTER — Other Ambulatory Visit: Payer: Self-pay | Admitting: Internal Medicine

## 2012-10-24 ENCOUNTER — Other Ambulatory Visit: Payer: Self-pay | Admitting: *Deleted

## 2012-10-24 MED ORDER — POTASSIUM CHLORIDE ER 10 MEQ PO TBCR: 10.0000 meq | EXTENDED_RELEASE_TABLET | Freq: Every day | ORAL | Status: DC

## 2012-11-26 ENCOUNTER — Telehealth: Payer: Self-pay | Admitting: Internal Medicine

## 2012-11-26 NOTE — Telephone Encounter (Signed)
Brandi Shaffer called from [employer] [facility] [employer] to inform Dr. Jonny Ruiz that [name] was visited by the nurse from [facility] and her lab was abnormal: A1C was 6.3 and LDL was 106.0. Pt has an appt with Dr. Jonny Ruiz in 12/04/12.

## 2012-12-04 ENCOUNTER — Ambulatory Visit (INDEPENDENT_AMBULATORY_CARE_PROVIDER_SITE_OTHER): Payer: Medicare Other | Admitting: Internal Medicine

## 2012-12-04 ENCOUNTER — Encounter: Payer: Self-pay | Admitting: Internal Medicine

## 2012-12-04 ENCOUNTER — Other Ambulatory Visit (INDEPENDENT_AMBULATORY_CARE_PROVIDER_SITE_OTHER): Payer: Medicare Other

## 2012-12-04 VITALS — BP 144/90 | HR 92 | Temp 97.2°F | Ht 59.0 in | Wt 191.0 lb

## 2012-12-04 DIAGNOSIS — Z0000 Encounter for general adult medical examination without abnormal findings: Secondary | ICD-10-CM

## 2012-12-04 DIAGNOSIS — Z23 Encounter for immunization: Secondary | ICD-10-CM

## 2012-12-04 DIAGNOSIS — IMO0001 Reserved for inherently not codable concepts without codable children: Secondary | ICD-10-CM

## 2012-12-04 LAB — TSH: TSH: 1.31 u[IU]/mL (ref 0.35–5.50)

## 2012-12-04 LAB — BASIC METABOLIC PANEL
BUN: 14 mg/dL (ref 6–23)
CO2: 29 mEq/L (ref 19–32)
Calcium: 9.4 mg/dL (ref 8.4–10.5)
Chloride: 106 mEq/L (ref 96–112)
Creatinine, Ser: 0.8 mg/dL (ref 0.4–1.2)
GFR: 90.44 mL/min (ref >60.00)
Glucose, Bld: 96 mg/dL (ref 70–99)
Potassium: 4.3 mEq/L (ref 3.5–5.1)
Sodium: 144 mEq/L (ref 135–145)

## 2012-12-04 LAB — URINALYSIS, ROUTINE W REFLEX MICROSCOPIC
Bilirubin Urine: NEGATIVE
Hgb urine dipstick: NEGATIVE
Ketones, ur: NEGATIVE
Leukocytes, UA: NEGATIVE
Nitrite: NEGATIVE
RBC / HPF: NONE SEEN (ref 0–?)
Specific Gravity, Urine: 1.02 (ref 1.000–1.030)
Total Protein, Urine: NEGATIVE
Urine Glucose: NEGATIVE
Urobilinogen, UA: 0.2 (ref 0.0–1.0)
pH: 6.5 (ref 5.0–8.0)

## 2012-12-04 LAB — CBC WITH DIFFERENTIAL/PLATELET
Basophils Absolute: 0 10*3/uL (ref 0.0–0.1)
Basophils Relative: 0.6 % (ref 0.0–3.0)
Eosinophils Absolute: 0.2 10*3/uL (ref 0.0–0.7)
Eosinophils Relative: 4.1 % (ref 0.0–5.0)
HCT: 37.2 % (ref 36.0–46.0)
Hemoglobin: 12.4 g/dL (ref 12.0–15.0)
Lymphocytes Relative: 48 % — ABNORMAL HIGH (ref 12.0–46.0)
Lymphs Abs: 2.5 10*3/uL (ref 0.7–4.0)
MCHC: 33.3 g/dL (ref 30.0–36.0)
MCV: 89.9 fl (ref 78.0–100.0)
Monocytes Absolute: 0.4 10*3/uL (ref 0.1–1.0)
Monocytes Relative: 7.3 % (ref 3.0–12.0)
Neutro Abs: 2.1 10*3/uL (ref 1.4–7.7)
Neutrophils Relative %: 40 % — ABNORMAL LOW (ref 43.0–77.0)
Platelets: 258 10*3/uL (ref 150.0–400.0)
RBC: 4.14 Mil/uL (ref 3.87–5.11)
RDW: 14.3 % (ref 11.5–14.6)
WBC: 5.3 10*3/uL (ref 4.5–10.5)

## 2012-12-04 LAB — HEPATIC FUNCTION PANEL
ALT: 12 U/L (ref 0–35)
AST: 18 U/L (ref 0–37)
Albumin: 4.1 g/dL (ref 3.5–5.2)
Alkaline Phosphatase: 99 U/L (ref 39–117)
Bilirubin, Direct: 0.1 mg/dL (ref 0.0–0.3)
Total Bilirubin: 0.7 mg/dL (ref 0.3–1.2)
Total Protein: 7.1 g/dL (ref 6.0–8.3)

## 2012-12-04 MED ORDER — POTASSIUM CHLORIDE ER 10 MEQ PO TBCR: 10.0000 meq | EXTENDED_RELEASE_TABLET | Freq: Every day | ORAL | Status: DC

## 2012-12-04 NOTE — Progress Notes (Signed)
Subjective:    Patient ID: Brandi Shaffer, female    DOB: [birth year], 67 y.o.   MRN: [id number]  HPI  Here for wellness and f/u;  Overall doing ok;  Pt denies CP, worsening SOB, DOE, wheezing, orthopnea, PND, worsening LE edema, palpitations, dizziness or syncope.  Pt denies neurological change such as new headache, facial or extremity weakness.  Pt denies polydipsia, polyuria, or low sugar symptoms. Pt states overall good compliance with treatment and medications, good tolerability, and has been trying to follow lower cholesterol diet.  Pt denies worsening depressive symptoms, suicidal ideation or panic. No fever, night sweats, wt loss, loss of appetite, or other constitutional symptoms.  Pt states good ability with ADL's, has low fall risk, home safety reviewed and adequate, no other significant changes in hearing or vision, and only occasionally active with exercise. S/p left shoulder surgury august 2014, doing better, has several sugars higher post op, but lately cbg's in the low 100's. Past Medical History  Diagnosis Date  . ANEMIA-IRON DEFICIENCY 12/06/2009  . DIABETES MELLITUS, TYPE II 09/17/2006  . DIVERTICULOSIS, COLON 12/06/2009  . GENITAL HERPES 03/24/2010  . HYPERLIPIDEMIA 03/24/2010  . HYPERTENSION 09/17/2006  . HYPOTHYROIDISM 09/17/2006  . LIPOMA 12/06/2009  . PERIPHERAL EDEMA 03/24/2010  . GERD (gastroesophageal reflux disease) 06/28/2011  . Renal cyst 07/05/2011    1.9 cm minimally complex  . Asthma 12/05/2011   Past Surgical History  Procedure Laterality Date  . Tubal ligation    . Abdominal hysterectomy      fibroids  . Ankle surgury      x 3 left - chronic pain/swelling  . Tonsillectomy      reports that she has never smoked. She does not have any smokeless tobacco history on file. She reports that she does not drink alcohol or use illicit drugs. family history includes Diabetes in her other; Heart disease in her sister; Hypertension in her father and mother; Joint  hypermobility in her other; Prostate cancer in her brother; Stroke in her sister. Allergies  Allergen Reactions  . Januvia [Sitagliptin Phosphate]     dizziness  . Metformin And Related Nausea Only   Review of Systems Constitutional: Negative for diaphoresis, activity change, appetite change or unexpected weight change.  HENT: Negative for hearing loss, ear pain, facial swelling, mouth sores and neck stiffness.   Eyes: Negative for pain, redness and visual disturbance.  Respiratory: Negative for shortness of breath and wheezing.   Cardiovascular: Negative for chest pain and palpitations.  Gastrointestinal: Negative for diarrhea, blood in stool, abdominal distention or other pain Genitourinary: Negative for hematuria, flank pain or change in urine volume.  Musculoskeletal: Negative for myalgias and joint swelling.  Skin: Negative for color change and wound.  Neurological: Negative for syncope and numbness. other than noted Hematological: Negative for adenopathy.  Psychiatric/Behavioral: Negative for hallucinations, self-injury, decreased concentration and agitation.      Objective:   Physical Exam BP 144/90  Pulse 92  Temp(Src) 97.2 F (36.2 C) (Oral)  Ht 4\' 11"  (1.499 m)  Wt 191 lb (86.637 kg)  BMI 38.56 kg/m2  SpO2 97% VS noted,  Constitutional: Pt is oriented to person, place, and time. Appears well-developed and well-nourished.  Head: Normocephalic and atraumatic.  Right Ear: External ear normal.  Left Ear: External ear normal.  Nose: Nose normal.  Mouth/Throat: Oropharynx is clear and moist.  Eyes: Conjunctivae and EOM are normal. Pupils are equal, round, and reactive to light.  Neck: Normal range of motion. Neck supple.  No JVD present. No tracheal deviation present.  Cardiovascular: Normal rate, regular rhythm, normal heart sounds and intact distal pulses.   Pulmonary/Chest: Effort normal and breath sounds normal.  Abdominal: Soft. Bowel sounds are normal. There is no  tenderness. No HSM  Musculoskeletal: Normal range of motion. Exhibits no edema.  Lymphadenopathy:  Has no cervical adenopathy.  Neurological: Pt is alert and oriented to person, place, and time. Pt has normal reflexes. No cranial nerve deficit.  Skin: Skin is warm and dry. No rash noted.  Psychiatric:  Has  normal mood and affect. Behavior is normal.  Unable to abduct left shoulder to 90 degrees       Assessment & Plan:

## 2012-12-04 NOTE — Assessment & Plan Note (Signed)

## 2012-12-04 NOTE — Patient Instructions (Addendum)
Please remember to followup with your GYN for the yearly pap smear and/or mammogram (such as [facility] on [address] or [facility] on [address]) You had the flu shot today, and please make Nurse Visit appt for 2 wks to have the Prevnar pnuemonia shot Please continue your efforts at being more active, low cholesterol diet, and weight control. You are otherwise up to date with prevention measures today. Please keep your appointments with your specialists as you may have planned Please go to the LAB in the Basement (turn left off the elevator) for the tests to be done today You will be contacted by phone if any changes need to be made immediately.  Otherwise, you will receive a letter about your results with an explanation, but please check with MyChart first.  Please remember to sign up for My Chart if you have not done so, as this will be important to you in the future with finding out test results, communicating by private email, and scheduling acute appointments online when needed.  Please return in 6 months, or sooner if needed, with Lab testing done 3-5 days before

## 2012-12-19 ENCOUNTER — Ambulatory Visit (INDEPENDENT_AMBULATORY_CARE_PROVIDER_SITE_OTHER): Payer: Medicare Other

## 2012-12-19 DIAGNOSIS — Z23 Encounter for immunization: Secondary | ICD-10-CM

## 2013-02-06 ENCOUNTER — Other Ambulatory Visit: Payer: Self-pay

## 2013-02-06 DIAGNOSIS — Z1231 Encounter for screening mammogram for malignant neoplasm of breast: Secondary | ICD-10-CM

## 2013-02-06 LAB — HM MAMMOGRAPHY

## 2013-02-08 ENCOUNTER — Encounter (HOSPITAL_COMMUNITY): Payer: Self-pay | Admitting: Emergency Medicine

## 2013-02-08 ENCOUNTER — Emergency Department (INDEPENDENT_AMBULATORY_CARE_PROVIDER_SITE_OTHER)
Admission: EM | Admit: 2013-02-08 | Discharge: 2013-02-08 | Disposition: A | Discharge status: Home / Self Care | Payer: Medicare Other | Source: Home / Self Care | Attending: Emergency Medicine | Admitting: Emergency Medicine

## 2013-02-08 DIAGNOSIS — IMO0002 Reserved for concepts with insufficient information to code with codable children: Secondary | ICD-10-CM

## 2013-02-08 DIAGNOSIS — S76911A Strain of unspecified muscles, fascia and tendons at thigh level, right thigh, initial encounter: Secondary | ICD-10-CM

## 2013-02-08 LAB — POCT URINALYSIS DIP (DEVICE)
Bilirubin Urine: NEGATIVE
Glucose, UA: NEGATIVE mg/dL
Hgb urine dipstick: NEGATIVE
Ketones, ur: NEGATIVE mg/dL
Leukocytes, UA: NEGATIVE
Nitrite: NEGATIVE
Protein, ur: NEGATIVE mg/dL
Specific Gravity, Urine: 1.02 (ref 1.005–1.030)
Urobilinogen, UA: 0.2 mg/dL (ref 0.0–1.0)
pH: 7 (ref 5.0–8.0)

## 2013-02-08 NOTE — ED Provider Notes (Signed)
Medical screening examination/treatment/procedure(s) were performed by non-physician practitioner and as supervising physician I was immediately available for consultation/collaboration.  Damyn Weitzel, M.D.  Jacques Fife C Jailon Schaible, MD 02/08/13 2101 

## 2013-02-08 NOTE — ED Notes (Signed)
Patient reports having right lower quadrant pain for 3 days.  Pain into right leg.  Reports having had pain in back past week, but that has resolved .  Denies vaginal issues.  Last bm was yesterday-normal per patient.  .  Denies and urinary symptoms.  Patient is very active, has a regular work-out routine.  Patient currently goes to rehab for left shoulder-had shoulder surgery in august.

## 2013-02-08 NOTE — ED Provider Notes (Signed)
CSN: [id number]     Arrival date & time 02/08/13  1005 History   First MD Initiated Contact with Patient 02/08/13 1215     Chief Complaint  Patient presents with  . Abdominal Pain   (Consider location/radiation/quality/duration/timing/severity/associated sxs/prior Treatment) Patient is a 67 y.o. female presenting with abdominal pain. The history is provided by the patient. No language interpreter was used.  Abdominal Pain This is a new problem. The current episode started more than 2 days ago. The problem occurs constantly. The problem has been gradually worsening. Pertinent negatives include no abdominal pain. Nothing aggravates the symptoms. The treatment provided no relief.   Pt complains of pain right groin and right upper leg,   Abdomen and back no pain Past Medical History  Diagnosis Date  . ANEMIA-IRON DEFICIENCY 12/06/2009  . DIABETES MELLITUS, TYPE II 09/17/2006  . DIVERTICULOSIS, COLON 12/06/2009  . GENITAL HERPES 03/24/2010  . HYPERLIPIDEMIA 03/24/2010  . HYPERTENSION 09/17/2006  . HYPOTHYROIDISM 09/17/2006  . LIPOMA 12/06/2009  . PERIPHERAL EDEMA 03/24/2010  . GERD (gastroesophageal reflux disease) 06/28/2011  . Renal cyst 07/05/2011    1.9 cm minimally complex  . Asthma 12/05/2011   Past Surgical History  Procedure Laterality Date  . Tubal ligation    . Abdominal hysterectomy      fibroids  . Ankle surgury      x 3 left - chronic pain/swelling  . Tonsillectomy     Family History  Problem Relation Age of Onset  . Hypertension Mother   . Hypertension Father   . Heart disease Sister     sudden death early 20's  . Stroke Sister   . Prostate cancer Brother     cancer  . Diabetes Other   . Joint hypermobility Other     DJD   History  Substance Use Topics  . Smoking status: Never Smoker   . Smokeless tobacco: Not on file  . Alcohol Use: No   OB History   Grav Para Term Preterm Abortions TAB SAB Ect Mult Living                 Review of Systems   Gastrointestinal: Negative for abdominal pain.  All other systems reviewed and are negative.    Allergies  Januvia and Metformin and related  Home Medications   Current Outpatient Rx  Name  Route  Sig  Dispense  Refill  . albuterol (PROVENTIL HFA;VENTOLIN HFA) 108 (90 BASE) MCG/ACT inhaler   Inhalation   Inhale 2 puffs into the lungs every 6 (six) hours as needed for wheezing.   1 Inhaler   11   . amLODipine (NORVASC) 5 MG tablet      TAKE 1 TABLET EVERY DAY   90 tablet   2   . Ascorbic Acid (VITAMIN C) 500 MG tablet   Oral   Take 500 mg by mouth daily.           Marland Kitchen aspirin 81 MG EC tablet   Oral   Take 1 tablet (81 mg total) by mouth daily. Swallow whole.   30 tablet   12   . Cholecalciferol (VITAMIN D) 400 UNITS tablet   Oral   Take 400 Units by mouth daily.           . Coenzyme Q10 (COQ10) 100 MG capsule   Oral   Take 100 mg by mouth daily.           Marland Kitchen HYDROcodone-acetaminophen (VICODIN ES) 7.5-750 MG per tablet  Oral   Take 1 tablet by mouth daily as needed. For pain   30 tablet   5   . ibuprofen (ADVIL,MOTRIN) 400 MG tablet   Oral   Take 1 tablet (400 mg total) by mouth every 6 (six) hours as needed for pain.   60 tablet   2   . irbesartan-hydrochlorothiazide (AVALIDE) 300-12.5 MG per tablet   Oral   Take 1 tablet by mouth daily.   90 tablet   3   . naproxen sodium (ANAPROX) 220 MG tablet   Oral   Take 220 mg by mouth daily as needed.           Marland Kitchen omeprazole (PRILOSEC) 40 MG capsule   Oral   Take 1 capsule (40 mg total) by mouth daily.   90 capsule   3   . pioglitazone (ACTOS) 15 MG tablet      TAKE 1 TABLET BY MOUTH EVERY DAY   90 tablet   3   . potassium chloride (KLOR-CON 10) 10 MEQ tablet   Oral   Take 1 tablet (10 mEq total) by mouth daily.   90 tablet   3   . EXPIRED: pravastatin (PRAVACHOL) 40 MG tablet   Oral   Take 1 tablet (40 mg total) by mouth every evening.   90 tablet   3   . vitamin B-12  (CYANOCOBALAMIN) 1000 MCG tablet   Oral   Take 1,000 mcg by mouth daily.            BP 173/78  Pulse 86  Temp(Src) 97.1 F (36.2 C) (Oral)  Resp 18  SpO2 97% Physical Exam  Constitutional: She appears well-developed and well-nourished.  HENT:  Head: Normocephalic and atraumatic.  Right Ear: External ear normal.  Left Ear: External ear normal.  Nose: Nose normal.  Mouth/Throat: Oropharynx is clear and moist.  Eyes: Conjunctivae are normal. Pupils are equal, round, and reactive to light.  Neck: Neck supple. Thyromegaly present.  Cardiovascular: Normal rate and normal heart sounds.   Pulmonary/Chest: Effort normal and breath sounds normal.  Abdominal: Soft. Bowel sounds are normal.  Musculoskeletal: She exhibits tenderness.  Neurological: She is alert.  Skin: Skin is warm.  Psychiatric: She has a normal mood and affect.    ED Course  Procedures (including critical care time) Labs Review Labs Reviewed  POCT URINALYSIS DIP (DEVICE)   Imaging Review No results found.  EKG Interpretation    Date/Time:    Ventricular Rate:    PR Interval:    QRS Duration:   QT Interval:    QTC Calculation:   R Axis:     Text Interpretation:              MDM   1. Muscle strain of right thigh    Take your baclofen 3 times day    Elson Areas, [place] 02/08/13 1235

## 2013-02-16 ENCOUNTER — Ambulatory Visit
Admission: RE | Admit: 2013-02-16 | Discharge: 2013-02-16 | Disposition: A | Discharge status: Home / Self Care | Payer: Medicare Other | Source: Ambulatory Visit

## 2013-02-16 DIAGNOSIS — Z1231 Encounter for screening mammogram for malignant neoplasm of breast: Secondary | ICD-10-CM

## 2013-02-20 ENCOUNTER — Other Ambulatory Visit: Payer: Self-pay | Admitting: Internal Medicine

## 2013-04-06 ENCOUNTER — Other Ambulatory Visit: Payer: Self-pay | Admitting: Internal Medicine

## 2013-04-26 ENCOUNTER — Other Ambulatory Visit: Payer: Self-pay | Admitting: Internal Medicine

## 2013-05-12 ENCOUNTER — Other Ambulatory Visit: Payer: Self-pay | Admitting: Internal Medicine

## 2013-06-04 ENCOUNTER — Ambulatory Visit (INDEPENDENT_AMBULATORY_CARE_PROVIDER_SITE_OTHER): Payer: Medicare Other | Admitting: Internal Medicine

## 2013-06-04 ENCOUNTER — Encounter: Payer: Self-pay | Admitting: Internal Medicine

## 2013-06-04 ENCOUNTER — Other Ambulatory Visit (INDEPENDENT_AMBULATORY_CARE_PROVIDER_SITE_OTHER): Payer: Medicare Other

## 2013-06-04 VITALS — BP 122/80 | HR 84 | Temp 98.3°F | Ht 59.0 in | Wt 187.2 lb

## 2013-06-04 DIAGNOSIS — E1165 Type 2 diabetes mellitus with hyperglycemia: Principal | ICD-10-CM

## 2013-06-04 DIAGNOSIS — I10 Essential (primary) hypertension: Secondary | ICD-10-CM

## 2013-06-04 DIAGNOSIS — Z0000 Encounter for general adult medical examination without abnormal findings: Secondary | ICD-10-CM

## 2013-06-04 DIAGNOSIS — E785 Hyperlipidemia, unspecified: Secondary | ICD-10-CM

## 2013-06-04 DIAGNOSIS — IMO0001 Reserved for inherently not codable concepts without codable children: Secondary | ICD-10-CM

## 2013-06-04 DIAGNOSIS — E119 Type 2 diabetes mellitus without complications: Secondary | ICD-10-CM

## 2013-06-04 DIAGNOSIS — J45909 Unspecified asthma, uncomplicated: Secondary | ICD-10-CM

## 2013-06-04 LAB — URINALYSIS, ROUTINE W REFLEX MICROSCOPIC
Bilirubin Urine: NEGATIVE
Hgb urine dipstick: NEGATIVE
Ketones, ur: NEGATIVE
Leukocytes, UA: NEGATIVE
Nitrite: NEGATIVE
RBC / HPF: NONE SEEN (ref 0–?)
Specific Gravity, Urine: <=1.005 — AB (ref 1.000–1.030)
Total Protein, Urine: NEGATIVE
Urine Glucose: NEGATIVE
Urobilinogen, UA: 0.2 (ref 0.0–1.0)
WBC, UA: NONE SEEN (ref 0–?)
pH: 6 (ref 5.0–8.0)

## 2013-06-04 LAB — LIPID PANEL
Cholesterol: 177 mg/dL (ref 0–200)
HDL: 49.6 mg/dL (ref >39.00)
LDL Cholesterol: 112 mg/dL — ABNORMAL HIGH (ref 0–99)
Total CHOL/HDL Ratio: 4
Triglycerides: 77 mg/dL (ref 0.0–149.0)
VLDL: 15.4 mg/dL (ref 0.0–40.0)

## 2013-06-04 LAB — MICROALBUMIN / CREATININE URINE RATIO
Creatinine,U: 42.7 mg/dL
Microalb Creat Ratio: 0.2 mg/g (ref 0.0–30.0)
Microalb, Ur: 0.1 mg/dL (ref 0.0–1.9)

## 2013-06-04 LAB — CBC WITH DIFFERENTIAL/PLATELET
Basophils Absolute: 0 10*3/uL (ref 0.0–0.1)
Basophils Relative: 0.6 % (ref 0.0–3.0)
Eosinophils Absolute: 0.4 10*3/uL (ref 0.0–0.7)
Eosinophils Relative: 5.3 % — ABNORMAL HIGH (ref 0.0–5.0)
HCT: 38.9 % (ref 36.0–46.0)
Hemoglobin: 13 g/dL (ref 12.0–15.0)
Lymphocytes Relative: 36 % (ref 12.0–46.0)
Lymphs Abs: 2.7 10*3/uL (ref 0.7–4.0)
MCHC: 33.5 g/dL (ref 30.0–36.0)
MCV: 90.4 fl (ref 78.0–100.0)
Monocytes Absolute: 0.6 10*3/uL (ref 0.1–1.0)
Monocytes Relative: 7.4 % (ref 3.0–12.0)
Neutro Abs: 3.8 10*3/uL (ref 1.4–7.7)
Neutrophils Relative %: 50.7 % (ref 43.0–77.0)
Platelets: 318 10*3/uL (ref 150.0–400.0)
RBC: 4.3 Mil/uL (ref 3.87–5.11)
RDW: 13.7 % (ref 11.5–14.6)
WBC: 7.5 10*3/uL (ref 4.5–10.5)

## 2013-06-04 LAB — BASIC METABOLIC PANEL
BUN: 13 mg/dL (ref 6–23)
CO2: 32 mEq/L (ref 19–32)
Calcium: 9.6 mg/dL (ref 8.4–10.5)
Chloride: 103 mEq/L (ref 96–112)
Creatinine, Ser: 0.8 mg/dL (ref 0.4–1.2)
GFR: 91.61 mL/min (ref >60.00)
Glucose, Bld: 111 mg/dL — ABNORMAL HIGH (ref 70–99)
Potassium: 4.2 mEq/L (ref 3.5–5.1)
Sodium: 142 mEq/L (ref 135–145)

## 2013-06-04 LAB — HEPATIC FUNCTION PANEL
ALT: 19 U/L (ref 0–35)
AST: 20 U/L (ref 0–37)
Albumin: 3.8 g/dL (ref 3.5–5.2)
Alkaline Phosphatase: 96 U/L (ref 39–117)
Bilirubin, Direct: 0 mg/dL (ref 0.0–0.3)
Total Bilirubin: 0.5 mg/dL (ref 0.3–1.2)
Total Protein: 7 g/dL (ref 6.0–8.3)

## 2013-06-04 LAB — TSH: TSH: 1.33 u[IU]/mL (ref 0.35–5.50)

## 2013-06-04 LAB — HEMOGLOBIN A1C: Hgb A1c MFr Bld: 6.2 % (ref 4.6–6.5)

## 2013-06-04 MED ORDER — ALBUTEROL SULFATE HFA 108 (90 BASE) MCG/ACT IN AERS: 2.0000 | INHALATION_SPRAY | Freq: Four times a day (QID) | RESPIRATORY_TRACT | Status: DC | PRN

## 2013-06-04 NOTE — Progress Notes (Signed)
Subjective:    Patient ID: Brandi Shaffer, female    DOB: [birth year], 68 y.o.   MRN: [id number]  HPI Here to f/u; overall doing ok,  Pt denies chest pain, increased sob or doe, wheezing, orthopnea, PND, increased LE swelling, palpitations, dizziness or syncope.  Pt denies polydipsia, polyuria, or low sugar symptoms such as weakness or confusion improved with po intake.  Pt denies new neurological symptoms such as new headache, or facial or extremity weakness or numbness.   Pt states overall good compliance with meds, has been trying to follow lower cholesterol, diabetic diet, with wt overall stable. Seen at [facility] with sinusitis about 10 days ago, finishing augmentin and cough syrup, needs new inhaler for asthma but o/w stable.  Right ear still with occas clogged feeling and popping. Past Medical History  Diagnosis Date  . ANEMIA-IRON DEFICIENCY 12/06/2009  . DIABETES MELLITUS, TYPE II 09/17/2006  . DIVERTICULOSIS, COLON 12/06/2009  . GENITAL HERPES 03/24/2010  . HYPERLIPIDEMIA 03/24/2010  . HYPERTENSION 09/17/2006  . HYPOTHYROIDISM 09/17/2006  . LIPOMA 12/06/2009  . PERIPHERAL EDEMA 03/24/2010  . GERD (gastroesophageal reflux disease) 06/28/2011  . Renal cyst 07/05/2011    1.9 cm minimally complex  . Asthma 12/05/2011   Past Surgical History  Procedure Laterality Date  . Tubal ligation    . Abdominal hysterectomy      fibroids  . Ankle surgury      x 3 left - chronic pain/swelling  . Tonsillectomy      reports that she has never smoked. She does not have any smokeless tobacco history on file. She reports that she does not drink alcohol or use illicit drugs. family history includes Diabetes in her other; Heart disease in her sister; Hypertension in her father and mother; Joint hypermobility in her other; Prostate cancer in her brother; Stroke in her sister. Allergies  Allergen Reactions  . Januvia [Sitagliptin Phosphate]     dizziness  . Metformin And Related Nausea Only   Current  Outpatient Prescriptions on File Prior to Visit  Medication Sig Dispense Refill  . amLODipine (NORVASC) 5 MG tablet TAKE 1 TABLET EVERY DAY  90 tablet  3  . Ascorbic Acid (VITAMIN C) 500 MG tablet Take 500 mg by mouth daily.        . Cholecalciferol (VITAMIN D) 400 UNITS tablet Take 400 Units by mouth daily.        . Coenzyme Q10 (COQ10) 100 MG capsule Take 100 mg by mouth daily.        . [facility] ASPIRIN LOW DOSE 81 MG EC tablet TAKE 1 TABLET BY MOUTH DAILY. SWALLOW WHOLE.  30 tablet  8  . ibuprofen (ADVIL,MOTRIN) 400 MG tablet Take 1 tablet (400 mg total) by mouth every 6 (six) hours as needed for pain.  60 tablet  2  . irbesartan-hydrochlorothiazide (AVALIDE) 300-12.5 MG per tablet TAKE 1 TABLET BY MOUTH DAILY.  90 tablet  3  . naproxen sodium (ANAPROX) 220 MG tablet Take 220 mg by mouth daily as needed.        Marland Kitchen omeprazole (PRILOSEC) 40 MG capsule Take 1 capsule (40 mg total) by mouth daily.  90 capsule  3  . pioglitazone (ACTOS) 15 MG tablet TAKE 1 TABLET BY MOUTH EVERY DAY  90 tablet  0  . potassium chloride (KLOR-CON 10) 10 MEQ tablet Take 1 tablet (10 mEq total) by mouth daily.  90 tablet  3  . vitamin B-12 (CYANOCOBALAMIN) 1000 MCG tablet Take 1,000 mcg by  mouth daily.        . pravastatin (PRAVACHOL) 40 MG tablet Take 1 tablet (40 mg total) by mouth every evening.  90 tablet  3   No current facility-administered medications on file prior to visit.   Review of Systems  Constitutional: Negative for unexpected weight change, or unusual diaphoresis  HENT: Negative for tinnitus.   Eyes: Negative for photophobia and visual disturbance.  Respiratory: Negative for choking and stridor.   Gastrointestinal: Negative for vomiting and blood in stool.  Genitourinary: Negative for hematuria and decreased urine volume.  Musculoskeletal: Negative for acute joint swelling Skin: Negative for color change and wound.  Neurological: Negative for tremors and numbness other than noted    Psychiatric/Behavioral: Negative for decreased concentration or  hyperactivity.       Objective:   Physical Exam BP 122/80  Pulse 84  Temp(Src) 98.3 F (36.8 C) (Oral)  Ht 4\' 11"  (1.499 m)  Wt 187 lb 4 oz (84.936 kg)  BMI 37.80 kg/m2  SpO2 97% VS noted,  Constitutional: Pt appears well-developed and well-nourished.  HENT: Head: NCAT.  Right Ear: External ear normal.  Left Ear: External ear normal.  Bilat tm's with mild erythema.  Max sinus areas non tender.  Pharynx with mild erythema, no exudate Eyes: Conjunctivae and EOM are normal. Pupils are equal, round, and reactive to light.  Neck: Normal range of motion. Neck supple.  Cardiovascular: Normal rate and regular rhythm.   Pulmonary/Chest: Effort normal and breath sounds normal.  Neurological: Pt is alert. Not confused  Skin: Skin is warm. No erythema.  Psychiatric: Pt behavior is normal. Thought content normal.      Assessment & Plan:

## 2013-06-04 NOTE — Patient Instructions (Addendum)
Ok to finish your current treatments  Your inhaler was refilled today  You can also take Delsym OTC for cough, and/or Mucinex (or it's generic off brand) for congestion, and tylenol as needed for pain.  Please continue all other medications as before, and refills have been done if requested. Please have the pharmacy call with any other refills you may need.  Please continue your efforts at being more active, low cholesterol diet, and weight control.  Please go to the LAB in the Basement (turn left off the elevator) for the tests to be done today You will be contacted by phone if any changes need to be made immediately.  Otherwise, you will receive a letter about your results with an explanation, but please check with MyChart first.  Please return in 6 months, or sooner if needed, with Lab testing done 3-5 days before

## 2013-06-04 NOTE — Progress Notes (Signed)
Pre visit review using our clinic review tool, if applicable. No additional management support is needed unless otherwise documented below in the visit note. 

## 2013-06-07 NOTE — Assessment & Plan Note (Signed)
.  stable overall by history and exam, recent data reviewed with pt, and pt to continue medical treatment as before,  to f/u any worsening symptoms or concerns Lab Results  Component Value Date   LDLCALC 112* 06/04/2013

## 2013-06-07 NOTE — Assessment & Plan Note (Signed)
stable overall by history and exam, recent data reviewed with pt, and pt to continue medical treatment as before,  to f/u any worsening symptoms or concerns SpO2 Readings from Last 3 Encounters:  06/04/13 97%  02/08/13 97%  12/04/12 97%   Inhaler refilled today

## 2013-06-07 NOTE — Assessment & Plan Note (Signed)
stable overall by history and exam, recent data reviewed with pt, and pt to continue medical treatment as before,  to f/u any worsening symptoms or concerns Lab Results  Component Value Date   HGBA1C 6.2 06/04/2013

## 2013-06-07 NOTE — Assessment & Plan Note (Signed)
stable overall by history and exam, recent data reviewed with pt, and pt to continue medical treatment as before,  to f/u any worsening symptoms or concerns BP Readings from Last 3 Encounters:  06/04/13 122/80  02/08/13 173/78  12/04/12 144/90

## 2013-07-24 ENCOUNTER — Other Ambulatory Visit: Payer: Self-pay | Admitting: Internal Medicine

## 2013-07-30 ENCOUNTER — Other Ambulatory Visit: Payer: Self-pay | Admitting: Internal Medicine

## 2013-07-31 ENCOUNTER — Telehealth: Payer: Self-pay

## 2013-07-31 NOTE — Telephone Encounter (Signed)
Received order from patient form for [place] [facility] [employer].  PCP completed the form, made a copy for chart and faxed to [phone].  Called the patient to inform form completed and faxed, did mail original to the patient.

## 2013-09-02 ENCOUNTER — Encounter: Payer: Self-pay | Admitting: Internal Medicine

## 2013-09-07 ENCOUNTER — Encounter: Payer: Self-pay | Admitting: Internal Medicine

## 2013-09-07 ENCOUNTER — Ambulatory Visit (INDEPENDENT_AMBULATORY_CARE_PROVIDER_SITE_OTHER): Payer: Medicare Other | Admitting: Internal Medicine

## 2013-09-07 ENCOUNTER — Other Ambulatory Visit (INDEPENDENT_AMBULATORY_CARE_PROVIDER_SITE_OTHER): Payer: Medicare Other

## 2013-09-07 VITALS — BP 118/90 | HR 70 | Temp 98.1°F | Wt 184.2 lb

## 2013-09-07 DIAGNOSIS — R1031 Right lower quadrant pain: Secondary | ICD-10-CM

## 2013-09-07 DIAGNOSIS — R1011 Right upper quadrant pain: Secondary | ICD-10-CM

## 2013-09-07 DIAGNOSIS — Z86010 Personal history of colonic polyps: Secondary | ICD-10-CM

## 2013-09-07 DIAGNOSIS — K5730 Diverticulosis of large intestine without perforation or abscess without bleeding: Secondary | ICD-10-CM

## 2013-09-07 LAB — CBC WITH DIFFERENTIAL/PLATELET
Basophils Absolute: 0 10*3/uL (ref 0.0–0.1)
Basophils Relative: 0.7 % (ref 0.0–3.0)
Eosinophils Absolute: 0.3 10*3/uL (ref 0.0–0.7)
Eosinophils Relative: 4.8 % (ref 0.0–5.0)
HCT: 38.6 % (ref 36.0–46.0)
Hemoglobin: 12.9 g/dL (ref 12.0–15.0)
Lymphocytes Relative: 41.2 % (ref 12.0–46.0)
Lymphs Abs: 2.4 10*3/uL (ref 0.7–4.0)
MCHC: 33.3 g/dL (ref 30.0–36.0)
MCV: 90.8 fl (ref 78.0–100.0)
Monocytes Absolute: 0.4 10*3/uL (ref 0.1–1.0)
Monocytes Relative: 6.2 % (ref 3.0–12.0)
Neutro Abs: 2.7 10*3/uL (ref 1.4–7.7)
Neutrophils Relative %: 47.1 % (ref 43.0–77.0)
Platelets: 245 10*3/uL (ref 150.0–400.0)
RBC: 4.26 Mil/uL (ref 3.87–5.11)
RDW: 14.3 % (ref 11.5–15.5)
WBC: 5.7 10*3/uL (ref 4.0–10.5)

## 2013-09-07 LAB — HEPATIC FUNCTION PANEL
ALT: 13 U/L (ref 0–35)
AST: 20 U/L (ref 0–37)
Albumin: 4 g/dL (ref 3.5–5.2)
Alkaline Phosphatase: 118 U/L — ABNORMAL HIGH (ref 39–117)
Bilirubin, Direct: 0.1 mg/dL (ref 0.0–0.3)
Total Bilirubin: 0.8 mg/dL (ref 0.2–1.2)
Total Protein: 7.3 g/dL (ref 6.0–8.3)

## 2013-09-07 LAB — BASIC METABOLIC PANEL
BUN: 15 mg/dL (ref 6–23)
CO2: 28 mEq/L (ref 19–32)
Calcium: 9.2 mg/dL (ref 8.4–10.5)
Chloride: 110 mEq/L (ref 96–112)
Creatinine, Ser: 0.8 mg/dL (ref 0.4–1.2)
GFR: 87.73 mL/min (ref >60.00)
Glucose, Bld: 88 mg/dL (ref 70–99)
Potassium: 3.8 mEq/L (ref 3.5–5.1)
Sodium: 144 mEq/L (ref 135–145)

## 2013-09-07 LAB — POCT URINALYSIS DIPSTICK
Bilirubin, UA: NEGATIVE
Blood, UA: NEGATIVE
Glucose, UA: NEGATIVE
Ketones, UA: NEGATIVE
Leukocytes, UA: NEGATIVE
Nitrite, UA: NEGATIVE
Protein, UA: NEGATIVE
Spec Grav, UA: 1.015
Urobilinogen, UA: NEGATIVE
pH, UA: 5

## 2013-09-07 MED ORDER — TRAMADOL HCL 50 MG PO TABS: 50.0000 mg | ORAL_TABLET | Freq: Three times a day (TID) | ORAL | Status: DC | PRN

## 2013-09-07 NOTE — Progress Notes (Signed)
Subjective:    Patient ID: Brandi Shaffer, female    DOB: 09/25/1945, 68 y.o.   MRN: [id number]  HPI   She describes intermittent right lower quadrant and right upper quadrant pain beginning 2 or 3 weeks ago. She describes it as a pushing/pressure discomfort up to a level VIII on a 10 scale. It will last 15-25 minutes.  These are 2 separate pains.  Standing does increase the discomfort. The pain can radiate from the right upper quadrant to the back and from the right lower quadrant and groin intermittently. She denies any pain which emanates from the back & radiates anteriorly.  Occasionally she'll take ibuprofen with relief. She has not taken ibuprofen for at least a week. Colonoscopy was done 01/16/10;the report was reviewed. She had polyps & diverticulosis.  She has a past history of partial hysterectomy for fibroids. She does not know whether she's had her ovaries removed. She also is unsure as to whether she's had an appendectomy. She does take Prilosec as needed. She drinks coffee and tea occasionally. She does not drink alcohol.      Review of Systems  She specifically denies dysphagia, dyspepsia, anorexia, hematemesis, unexplained weight loss, fever, chills, dysuria, pyuria, or hematuria.  No associated rash in the area of discomfort.    Objective:   Physical Exam General appearance is one of good health and nourishment w/o distress.  Eyes: No conjunctival inflammation or scleral icterus is present.  Oral exam: Dental hygiene is good; lips and gums are healthy appearing.There is no oropharyngeal erythema or exudate noted.   Heart:  Normal rate and regular rhythm. S1 and S2 normal without gallop,  click, rub or other extra sounds .Grade 1/6 systolic murmur   Lungs:Chest clear to auscultation; no wheezes, rhonchi,rales ,or rubs present.No increased work of breathing.   Abdomen: bowel sounds normal, soft but slightly tender RLQ but not RUQ without masses, organomegaly  or hernias noted.  No guarding or rebound . No tenderness over the flanks to percussion  Musculoskeletal: Able to lie flat and sit up without help. Negative straight leg raising bilaterally. Gait normal  Skin:Warm & dry.  Intact without suspicious lesions or rashes ; no jaundice or tenting  Lymphatic: No lymphadenopathy is noted about the head, neck, axillary areas.                Assessment & Plan:  #1 RLQ & RUQ abd pain #2 PMH polyps & diverticulosis See orders & AVS

## 2013-09-07 NOTE — Progress Notes (Signed)
Pre visit review using our clinic review tool, if applicable. No additional management support is needed unless otherwise documented below in the visit note. 

## 2013-09-07 NOTE — Patient Instructions (Signed)
Your next office appointment will be determined based upon review of your pending labs &/ or x-rays. Those instructions will be transmitted to you through My Chart  OR  by mail;whichever process is your choice to receive results & recommendations . Followup as needed for your acute issue. Please report any significant change in your symptoms.

## 2013-09-08 DIAGNOSIS — Z86010 Personal history of colonic polyps: Secondary | ICD-10-CM | POA: Insufficient documentation

## 2013-09-08 DIAGNOSIS — E669 Obesity, unspecified: Secondary | ICD-10-CM | POA: Insufficient documentation

## 2013-09-15 ENCOUNTER — Other Ambulatory Visit (INDEPENDENT_AMBULATORY_CARE_PROVIDER_SITE_OTHER): Payer: Medicare Other

## 2013-09-15 DIAGNOSIS — R1011 Right upper quadrant pain: Secondary | ICD-10-CM

## 2013-09-15 DIAGNOSIS — R1031 Right lower quadrant pain: Secondary | ICD-10-CM

## 2013-09-15 LAB — HEMOCCULT SLIDES (X 3 CARDS)
Fecal Occult Blood: NEGATIVE
OCCULT 1: NEGATIVE
OCCULT 2: NEGATIVE
OCCULT 3: NEGATIVE
OCCULT 4: NEGATIVE
OCCULT 5: NEGATIVE

## 2013-09-18 ENCOUNTER — Encounter: Payer: Self-pay | Admitting: Internal Medicine

## 2013-09-18 ENCOUNTER — Ambulatory Visit (INDEPENDENT_AMBULATORY_CARE_PROVIDER_SITE_OTHER): Payer: Medicare Other | Admitting: Internal Medicine

## 2013-09-18 ENCOUNTER — Other Ambulatory Visit (INDEPENDENT_AMBULATORY_CARE_PROVIDER_SITE_OTHER): Payer: Medicare Other

## 2013-09-18 VITALS — BP 180/90 | HR 81 | Temp 97.9°F | Ht 59.0 in | Wt 187.4 lb

## 2013-09-18 DIAGNOSIS — I10 Essential (primary) hypertension: Secondary | ICD-10-CM

## 2013-09-18 DIAGNOSIS — Z0000 Encounter for general adult medical examination without abnormal findings: Secondary | ICD-10-CM

## 2013-09-18 DIAGNOSIS — E785 Hyperlipidemia, unspecified: Secondary | ICD-10-CM

## 2013-09-18 DIAGNOSIS — E119 Type 2 diabetes mellitus without complications: Secondary | ICD-10-CM

## 2013-09-18 LAB — LIPID PANEL
Cholesterol: 173 mg/dL (ref 0–200)
HDL: 51.1 mg/dL (ref >39.00)
LDL Cholesterol: 110 mg/dL — ABNORMAL HIGH (ref 0–99)
NonHDL: 121.9
Total CHOL/HDL Ratio: 3
Triglycerides: 58 mg/dL (ref 0.0–149.0)
VLDL: 11.6 mg/dL (ref 0.0–40.0)

## 2013-09-18 LAB — HEMOGLOBIN A1C: Hgb A1c MFr Bld: 5.9 % (ref 4.6–6.5)

## 2013-09-18 NOTE — Patient Instructions (Signed)

## 2013-09-18 NOTE — Progress Notes (Signed)
Pre visit review using our clinic review tool, if applicable. No additional management support is needed unless otherwise documented below in the visit note. 

## 2013-09-18 NOTE — Progress Notes (Signed)
Subjective:    Patient ID: Brandi Shaffer, female    DOB: April 24, 1945, 68 y.o.   MRN: [id number]  HPI  Here to f/u; overall doing ok,  Pt denies chest pain, increased sob or doe, wheezing, orthopnea, PND, increased LE swelling, palpitations, dizziness or syncope.  Pt denies polydipsia, polyuria, or low sugar symptoms such as weakness or confusion improved with po intake.  Pt denies new neurological symptoms such as new headache, or facial or extremity weakness or numbness.   Pt states overall good compliance with meds, has been trying to follow lower cholesterol, diabetic diet, with wt overall stable,  but little exercise however.  Abd pain from last visit resolved.   Did not take BP med this am.  No current complaints. Past Medical History  Diagnosis Date  . ANEMIA-IRON DEFICIENCY 12/06/2009  . DIABETES MELLITUS, TYPE II 09/17/2006  . DIVERTICULOSIS, COLON 12/06/2009  . GENITAL HERPES 03/24/2010  . HYPERLIPIDEMIA 03/24/2010  . HYPERTENSION 09/17/2006  . HYPOTHYROIDISM 09/17/2006  . LIPOMA 12/06/2009  . PERIPHERAL EDEMA 03/24/2010  . GERD (gastroesophageal reflux disease) 06/28/2011  . Renal cyst 07/05/2011    1.9 cm minimally complex  . Asthma 12/05/2011   Past Surgical History  Procedure Laterality Date  . Tubal ligation    . Abdominal hysterectomy      fibroids  . Ankle surgury      x 3 left - chronic pain/swelling  . Tonsillectomy      reports that she has never smoked. She does not have any smokeless tobacco history on file. She reports that she does not drink alcohol or use illicit drugs. family history includes Diabetes in her other; Heart disease in her sister; Hypertension in her father and mother; Joint hypermobility in her other; Prostate cancer in her brother; Stroke in her sister. Allergies  Allergen Reactions  . Januvia [Sitagliptin Phosphate]     dizziness  . Metformin And Related Nausea Only   Current Outpatient Prescriptions on File Prior to Visit  Medication Sig  Dispense Refill  . albuterol (PROVENTIL HFA;VENTOLIN HFA) 108 (90 BASE) MCG/ACT inhaler Inhale 2 puffs into the lungs every 6 (six) hours as needed for wheezing.  1 Inhaler  11  . amLODipine (NORVASC) 5 MG tablet TAKE 1 TABLET EVERY DAY  90 tablet  3  . Ascorbic Acid (VITAMIN C) 500 MG tablet Take 500 mg by mouth daily.        . Cholecalciferol (VITAMIN D) 400 UNITS tablet Take 400 Units by mouth daily.        . Coenzyme Q10 (COQ10) 100 MG capsule Take 100 mg by mouth daily.        . [facility] ASPIRIN LOW DOSE 81 MG EC tablet TAKE 1 TABLET BY MOUTH DAILY. SWALLOW WHOLE.  30 tablet  8  . ibuprofen (ADVIL,MOTRIN) 400 MG tablet TAKE 1 TABLET (400 MG TOTAL) BY MOUTH EVERY 6 (SIX) HOURS AS NEEDED FOR PAIN.  60 tablet  0  . irbesartan-hydrochlorothiazide (AVALIDE) 300-12.5 MG per tablet TAKE 1 TABLET BY MOUTH DAILY.  90 tablet  3  . naproxen sodium (ANAPROX) 220 MG tablet Take 220 mg by mouth daily as needed.        . pioglitazone (ACTOS) 15 MG tablet TAKE 1 TABLET BY MOUTH EVERY DAY  90 tablet  3  . potassium chloride (KLOR-CON 10) 10 MEQ tablet Take 1 tablet (10 mEq total) by mouth daily.  90 tablet  3  . traMADol (ULTRAM) 50 MG tablet Take 1  tablet (50 mg total) by mouth every 8 (eight) hours as needed.  30 tablet  0  . vitamin B-12 (CYANOCOBALAMIN) 1000 MCG tablet Take 1,000 mcg by mouth daily.        . pravastatin (PRAVACHOL) 40 MG tablet Take 1 tablet (40 mg total) by mouth every evening.  90 tablet  3   No current facility-administered medications on file prior to visit.   Review of Systems  Constitutional: Negative for unusual diaphoresis or other sweats  HENT: Negative for ringing in ear Eyes: Negative for double vision or worsening visual disturbance.  Respiratory: Negative for choking and stridor.   Gastrointestinal: Negative for vomiting or other signifcant bowel change Genitourinary: Negative for hematuria or decreased urine volume.  Musculoskeletal: Negative for other MSK pain or  swelling Skin: Negative for color change and worsening wound.  Neurological: Negative for tremors and numbness other than noted  Psychiatric/Behavioral: Negative for decreased concentration or agitation other than above  '    Objective:   Physical Exam BP 180/90  Pulse 81  Temp(Src) 97.9 F (36.6 C) (Oral)  Ht 4\' 11"  (1.499 m)  Wt 187 lb 6 oz (84.993 kg)  BMI 37.83 kg/m2  SpO2 98% VS noted, f/u BP 124/72 Constitutional: Pt appears well-developed, well-nourished.  HENT: Head: NCAT.  Right Ear: External ear normal.  Left Ear: External ear normal.  Eyes: . Pupils are equal, round, and reactive to light. Conjunctivae and EOM are normal Neck: Normal range of motion. Neck supple.  Cardiovascular: Normal rate and regular rhythm.   Pulmonary/Chest: Effort normal and breath sounds normal.  Abd:  Soft, NT, ND, + BS Neurological: Pt is alert. Not confused , motor grossly intact Skin: Skin is warm. No rash Psychiatric: Pt behavior is normal. No agitation.      Assessment & Plan:

## 2013-09-20 NOTE — Assessment & Plan Note (Signed)
Initial BP spurious is seems, BP repeat controlled, and stable overall by history and exam, recent data reviewed with pt, and pt to continue medical treatment as before,  to f/u any worsening symptoms or concerns BP Readings from Last 3 Encounters:  09/18/13 180/90  09/07/13 118/90  06/04/13 122/80

## 2013-09-20 NOTE — Assessment & Plan Note (Signed)
stable overall by history and exam, recent data reviewed with pt, and pt to continue medical treatment as before,  to f/u any worsening symptoms or concerns Lab Results  Component Value Date   HGBA1C 5.9 09/18/2013

## 2013-09-20 NOTE — Assessment & Plan Note (Signed)
stable overall by history and exam, recent data reviewed with pt, and pt to continue medical treatment as before,  to f/u any worsening symptoms or concerns Lab Results  Component Value Date   LDLCALC 110* 09/18/2013

## 2013-11-11 ENCOUNTER — Other Ambulatory Visit: Payer: Self-pay | Admitting: Internal Medicine

## 2013-12-04 ENCOUNTER — Encounter: Payer: Self-pay | Admitting: Internal Medicine

## 2013-12-04 ENCOUNTER — Other Ambulatory Visit (INDEPENDENT_AMBULATORY_CARE_PROVIDER_SITE_OTHER): Payer: Medicare Other

## 2013-12-04 ENCOUNTER — Ambulatory Visit (INDEPENDENT_AMBULATORY_CARE_PROVIDER_SITE_OTHER): Payer: Medicare Other | Admitting: Internal Medicine

## 2013-12-04 VITALS — BP 122/80 | HR 76 | Temp 98.1°F | Wt 187.0 lb

## 2013-12-04 DIAGNOSIS — Z23 Encounter for immunization: Secondary | ICD-10-CM

## 2013-12-04 DIAGNOSIS — Z0000 Encounter for general adult medical examination without abnormal findings: Secondary | ICD-10-CM

## 2013-12-04 DIAGNOSIS — E119 Type 2 diabetes mellitus without complications: Secondary | ICD-10-CM

## 2013-12-04 DIAGNOSIS — M545 Low back pain, unspecified: Secondary | ICD-10-CM

## 2013-12-04 DIAGNOSIS — I10 Essential (primary) hypertension: Secondary | ICD-10-CM

## 2013-12-04 DIAGNOSIS — E039 Hypothyroidism, unspecified: Secondary | ICD-10-CM

## 2013-12-04 LAB — LIPID PANEL
Cholesterol: 184 mg/dL (ref 0–200)
HDL: 48.2 mg/dL (ref >39.00)
LDL Cholesterol: 117 mg/dL — ABNORMAL HIGH (ref 0–99)
NonHDL: 135.8
Total CHOL/HDL Ratio: 4
Triglycerides: 94 mg/dL (ref 0.0–149.0)
VLDL: 18.8 mg/dL (ref 0.0–40.0)

## 2013-12-04 LAB — CBC WITH DIFFERENTIAL/PLATELET
Basophils Absolute: 0 10*3/uL (ref 0.0–0.1)
Basophils Relative: 0.6 % (ref 0.0–3.0)
Eosinophils Absolute: 0.4 10*3/uL (ref 0.0–0.7)
Eosinophils Relative: 5.7 % — ABNORMAL HIGH (ref 0.0–5.0)
HCT: 38.8 % (ref 36.0–46.0)
Hemoglobin: 12.8 g/dL (ref 12.0–15.0)
Lymphocytes Relative: 44.7 % (ref 12.0–46.0)
Lymphs Abs: 2.9 10*3/uL (ref 0.7–4.0)
MCHC: 33 g/dL (ref 30.0–36.0)
MCV: 91.9 fl (ref 78.0–100.0)
Monocytes Absolute: 0.5 10*3/uL (ref 0.1–1.0)
Monocytes Relative: 8.2 % (ref 3.0–12.0)
Neutro Abs: 2.7 10*3/uL (ref 1.4–7.7)
Neutrophils Relative %: 40.8 % — ABNORMAL LOW (ref 43.0–77.0)
Platelets: 293 10*3/uL (ref 150.0–400.0)
RBC: 4.23 Mil/uL (ref 3.87–5.11)
RDW: 13.9 % (ref 11.5–15.5)
WBC: 6.6 10*3/uL (ref 4.0–10.5)

## 2013-12-04 LAB — URINALYSIS, ROUTINE W REFLEX MICROSCOPIC
Bilirubin Urine: NEGATIVE
Hgb urine dipstick: NEGATIVE
Ketones, ur: NEGATIVE
Leukocytes, UA: NEGATIVE
Nitrite: NEGATIVE
Specific Gravity, Urine: 1.01 (ref 1.000–1.030)
Total Protein, Urine: NEGATIVE
Urine Glucose: NEGATIVE
Urobilinogen, UA: 0.2 (ref 0.0–1.0)
pH: 7 (ref 5.0–8.0)

## 2013-12-04 LAB — HEPATIC FUNCTION PANEL
ALT: 14 U/L (ref 0–35)
AST: 21 U/L (ref 0–37)
Albumin: 3.6 g/dL (ref 3.5–5.2)
Alkaline Phosphatase: 115 U/L (ref 39–117)
Bilirubin, Direct: 0.1 mg/dL (ref 0.0–0.3)
Total Bilirubin: 0.6 mg/dL (ref 0.2–1.2)
Total Protein: 7.4 g/dL (ref 6.0–8.3)

## 2013-12-04 LAB — MICROALBUMIN / CREATININE URINE RATIO
Creatinine,U: 46.7 mg/dL
Microalb Creat Ratio: 0.4 mg/g (ref 0.0–30.0)
Microalb, Ur: 0.2 mg/dL (ref 0.0–1.9)

## 2013-12-04 LAB — BASIC METABOLIC PANEL
BUN: 15 mg/dL (ref 6–23)
CO2: 28 mEq/L (ref 19–32)
Calcium: 9.5 mg/dL (ref 8.4–10.5)
Chloride: 106 mEq/L (ref 96–112)
Creatinine, Ser: 0.9 mg/dL (ref 0.4–1.2)
GFR: 85.29 mL/min (ref >60.00)
Glucose, Bld: 81 mg/dL (ref 70–99)
Potassium: 4.6 mEq/L (ref 3.5–5.1)
Sodium: 141 mEq/L (ref 135–145)

## 2013-12-04 LAB — HEMOGLOBIN A1C: Hgb A1c MFr Bld: 6 % (ref 4.6–6.5)

## 2013-12-04 LAB — TSH: TSH: 1.79 u[IU]/mL (ref 0.35–4.50)

## 2013-12-04 NOTE — Patient Instructions (Addendum)
You had the flu shot today  Please continue all other medications as before, and refills have been done if requested.  Please have the pharmacy call with any other refills you may need.  Please continue your efforts at being more active, low cholesterol diet, and weight control.  You are otherwise up to date with prevention measures today.  Please keep your appointments with your specialists as you may have planned  Please go to the LAB in the Basement (turn left off the elevator) for the tests to be done today  You will be contacted by phone if any changes need to be made immediately.  Otherwise, you will receive a letter about your results with an explanation, but please check with MyChart first.  Please return in 6 months, or sooner if needed, with Lab testing done 3-5 days before  

## 2013-12-04 NOTE — Progress Notes (Signed)
Pre visit review using our clinic review tool, if applicable. No additional management support is needed unless otherwise documented below in the visit note. 

## 2013-12-04 NOTE — Progress Notes (Signed)
Subjective:    Patient ID: Brandi Shaffer, female    DOB: 12/16/1945, 68 y.o.   MRN: [id number]  HPI   Here for wellness and f/u;  Overall doing ok;  Pt denies CP, worsening SOB, DOE, wheezing, orthopnea, PND, worsening LE edema, palpitations, dizziness or syncope.  Pt denies neurological change such as new headache, facial or extremity weakness.  Pt denies polydipsia, polyuria, or low sugar symptoms. Pt states overall good compliance with treatment and medications, good tolerability, and has been trying to follow lower cholesterol diet.  Pt denies worsening depressive symptoms, suicidal ideation or panic. No fever, night sweats, wt loss, loss of appetite, or other constitutional symptoms.  Pt states good ability with ADL's, has low fall risk, home safety reviewed and adequate, no other significant changes in hearing or vision, and only occasionally active with exercise.  Due for flu shot.  Deos have some occasional abd bloating, thnks may be due to flax seed recently. More stress lately due to duaghter and 2 teens moved back in with her for hte 8th time. Overall pain ok, only really takes the hydrocodone during cold months when the DJD really acts up Past Medical History  Diagnosis Date  . ANEMIA-IRON DEFICIENCY 12/06/2009  . DIABETES MELLITUS, TYPE II 09/17/2006  . DIVERTICULOSIS, COLON 12/06/2009  . GENITAL HERPES 03/24/2010  . HYPERLIPIDEMIA 03/24/2010  . HYPERTENSION 09/17/2006  . HYPOTHYROIDISM 09/17/2006  . LIPOMA 12/06/2009  . PERIPHERAL EDEMA 03/24/2010  . GERD (gastroesophageal reflux disease) 06/28/2011  . Renal cyst 07/05/2011    1.9 cm minimally complex  . Asthma 12/05/2011   Past Surgical History  Procedure Laterality Date  . Tubal ligation    . Abdominal hysterectomy      fibroids  . Ankle surgury      x 3 left - chronic pain/swelling  . Tonsillectomy      reports that she has never smoked. She does not have any smokeless tobacco history on file. She reports that she does not  drink alcohol or use illicit drugs. family history includes Diabetes in her other; Heart disease in her sister; Hypertension in her father and mother; Joint hypermobility in her other; Prostate cancer in her brother; Stroke in her sister. Allergies  Allergen Reactions  . Januvia [Sitagliptin Phosphate]     dizziness  . Metformin And Related Nausea Only   Current Outpatient Prescriptions on File Prior to Visit  Medication Sig Dispense Refill  . albuterol (PROVENTIL HFA;VENTOLIN HFA) 108 (90 BASE) MCG/ACT inhaler Inhale 2 puffs into the lungs every 6 (six) hours as needed for wheezing.  1 Inhaler  11  . amLODipine (NORVASC) 5 MG tablet TAKE 1 TABLET EVERY DAY  90 tablet  3  . Ascorbic Acid (VITAMIN C) 500 MG tablet Take 500 mg by mouth daily.        . Cholecalciferol (VITAMIN D) 400 UNITS tablet Take 400 Units by mouth daily.        . Coenzyme Q10 (COQ10) 100 MG capsule Take 100 mg by mouth daily.        . [facility] ASPIRIN LOW DOSE 81 MG EC tablet TAKE 1 TABLET BY MOUTH DAILY. SWALLOW WHOLE.  30 tablet  8  . ibuprofen (ADVIL,MOTRIN) 400 MG tablet TAKE 1 TABLET (400 MG TOTAL) BY MOUTH EVERY 6 (SIX) HOURS AS NEEDED FOR PAIN.  60 tablet  0  . irbesartan-hydrochlorothiazide (AVALIDE) 300-12.5 MG per tablet TAKE 1 TABLET BY MOUTH DAILY.  90 tablet  3  . KLOR-CON  10 10 MEQ tablet TAKE 1 TABLET (10 MEQ TOTAL) BY MOUTH DAILY.  90 tablet  3  . naproxen sodium (ANAPROX) 220 MG tablet Take 220 mg by mouth daily as needed.        . pioglitazone (ACTOS) 15 MG tablet TAKE 1 TABLET BY MOUTH EVERY DAY  90 tablet  3  . potassium chloride (KLOR-CON 10) 10 MEQ tablet Take 1 tablet (10 mEq total) by mouth daily.  90 tablet  3  . traMADol (ULTRAM) 50 MG tablet Take 1 tablet (50 mg total) by mouth every 8 (eight) hours as needed.  30 tablet  0  . vitamin B-12 (CYANOCOBALAMIN) 1000 MCG tablet Take 1,000 mcg by mouth daily.        . pravastatin (PRAVACHOL) 40 MG tablet Take 1 tablet (40 mg total) by mouth every evening.   90 tablet  3   No current facility-administered medications on file prior to visit.   Review of Systems Constitutional: Negative for increased diaphoresis, other activity, appetite or other siginficant weight change  HENT: Negative for worsening hearing loss, ear pain, facial swelling, mouth sores and neck stiffness.   Eyes: Negative for other worsening pain, redness or visual disturbance.  Respiratory: Negative for shortness of breath and wheezing.   Cardiovascular: Negative for chest pain and palpitations.  Gastrointestinal: Negative for diarrhea, blood in stool, abdominal distention or other pain Genitourinary: Negative for hematuria, flank pain or change in urine volume.  Musculoskeletal: Negative for myalgias or other joint complaints.  Skin: Negative for color change and wound.  Neurological: Negative for syncope and numbness. other than noted Hematological: Negative for adenopathy. or other swelling Psychiatric/Behavioral: Negative for hallucinations, self-injury, decreased concentration or other worsening agitation.      Objective:   Physical Exam BP 122/80  Pulse 76  Temp(Src) 98.1 F (36.7 C) (Oral)  Wt 187 lb (84.823 kg)  SpO2 97% VS noted,  Constitutional: Pt is oriented to person, place, and time. Appears well-developed and well-nourished.  Head: Normocephalic and atraumatic.  Right Ear: External ear normal.  Left Ear: External ear normal.  Nose: Nose normal.  Mouth/Throat: Oropharynx is clear and moist.  Eyes: Conjunctivae and EOM are normal. Pupils are equal, round, and reactive to light.  Neck: Normal range of motion. Neck supple. No JVD present. No tracheal deviation present.  Cardiovascular: Normal rate, regular rhythm, normal heart sounds and intact distal pulses.   Pulmonary/Chest: Effort normal and breath sounds without rales or wheezing  Abdominal: Soft. Bowel sounds are normal. NT. No HSM  Musculoskeletal: Normal range of motion. Exhibits no edema.    Lymphadenopathy:  Has no cervical adenopathy.  Neurological: Pt is alert and oriented to person, place, and time. Pt has normal reflexes. No cranial nerve deficit. Motor grossly intact Skin: Skin is warm and dry. No rash noted.  Psychiatric:  Has normal mood and affect. Behavior is normal. mild nervous Wt Readings from Last 3 Encounters:  12/04/13 187 lb (84.823 kg)  09/18/13 187 lb 6 oz (84.993 kg)  09/07/13 184 lb 3.2 oz (83.553 kg)       Assessment & Plan:

## 2013-12-06 NOTE — Addendum Note (Signed)
Addended by: Biagio Borg on: 12/06/2013 06:39 PM   Modules accepted: Orders

## 2013-12-06 NOTE — Assessment & Plan Note (Signed)
stable overall by history and exam, recent data reviewed with pt, and pt to continue medical treatment as before,  to f/u any worsening symptoms or concerns Lab Results  Component Value Date   HGBA1C 6.0 12/04/2013   For f/u lab next visit

## 2013-12-06 NOTE — Assessment & Plan Note (Signed)

## 2013-12-06 NOTE — Assessment & Plan Note (Signed)
For limited hydrocodone refill, prn use only,  to f/u any worsening symptoms or concerns

## 2013-12-07 ENCOUNTER — Telehealth: Payer: Self-pay

## 2013-12-07 NOTE — Telephone Encounter (Signed)
Pt stated that she has an appointment scheduled in December 2015.

## 2013-12-09 ENCOUNTER — Telehealth: Payer: Self-pay | Admitting: Internal Medicine

## 2013-12-09 DIAGNOSIS — Z0000 Encounter for general adult medical examination without abnormal findings: Secondary | ICD-10-CM

## 2013-12-09 NOTE — Telephone Encounter (Signed)
referral done.

## 2013-12-09 NOTE — Telephone Encounter (Signed)
Pt called in requesting a referral to the Dr below.      [facility] [address] Phone number [phone]

## 2013-12-30 ENCOUNTER — Ambulatory Visit: Payer: Self-pay | Admitting: Obstetrics & Gynecology

## 2014-01-04 ENCOUNTER — Ambulatory Visit (INDEPENDENT_AMBULATORY_CARE_PROVIDER_SITE_OTHER): Payer: Medicare Other | Admitting: Obstetrics & Gynecology

## 2014-01-04 ENCOUNTER — Encounter: Payer: Self-pay | Admitting: Obstetrics & Gynecology

## 2014-01-04 VITALS — BP 158/97 | HR 67 | Temp 98.1°F | Ht 59.0 in | Wt 186.0 lb

## 2014-01-04 DIAGNOSIS — Z01419 Encounter for gynecological examination (general) (routine) without abnormal findings: Secondary | ICD-10-CM

## 2014-01-04 NOTE — Progress Notes (Signed)
Subjective:     Brandi Shaffer is a 68 y.o. female here for a routine exam.    Personal health questionnaire:  Is patient Ashkenazi Jewish, have a family history of breast and/or ovarian cancer: no Is there a family history of uterine cancer diagnosed at age < 80, gastrointestinal cancer, urinary tract cancer, family member who is a Field seismologist syndrome-associated carrier: no Is the patient overweight and hypertensive, family history of diabetes, personal history of gestational diabetes or PCOS: yes Is patient over 28, have PCOS,  family history of premature CHD under age 89, diabetes, smoke, have hypertension or peripheral artery disease:  yes At any time, has a partner hit, kicked or otherwise hurt or frightened you?: no Over the past 2 weeks, have you felt down, depressed or hopeless?: no Over the past 2 weeks, have you felt little interest or pleasure in doing things?:no   Gynecologic History No LMP recorded. Patient has had a hysterectomy. Last mammogram: 2015. Results were: normal  Obstetric History OB History  No data available    Past Medical History  Diagnosis Date  . ANEMIA-IRON DEFICIENCY 12/06/2009  . DIABETES MELLITUS, TYPE II 09/17/2006  . DIVERTICULOSIS, COLON 12/06/2009  . GENITAL HERPES 03/24/2010  . HYPERLIPIDEMIA 03/24/2010  . HYPERTENSION 09/17/2006  . HYPOTHYROIDISM 09/17/2006  . LIPOMA 12/06/2009  . PERIPHERAL EDEMA 03/24/2010  . GERD (gastroesophageal reflux disease) 06/28/2011  . Renal cyst 07/05/2011    1.9 cm minimally complex  . Asthma 12/05/2011    Past Surgical History  Procedure Laterality Date  . Tubal ligation    . Abdominal hysterectomy      fibroids  . Ankle surgury      x 3 left - chronic pain/swelling  . Tonsillectomy    . Shoulder surgery Left     Current outpatient prescriptions: albuterol (PROVENTIL HFA;VENTOLIN HFA) 108 (90 BASE) MCG/ACT inhaler, Inhale 2 puffs into the lungs every 6 (six) hours as needed for wheezing., Disp: 1 Inhaler, Rfl: 11;   amLODipine (NORVASC) 5 MG tablet, TAKE 1 TABLET EVERY DAY, Disp: 90 tablet, Rfl: 3;  Ascorbic Acid (VITAMIN C) 500 MG tablet, Take 500 mg by mouth daily.  , Disp: , Rfl:  Cholecalciferol (VITAMIN D) 400 UNITS tablet, Take 400 Units by mouth daily.  , Disp: , Rfl: ;  Coenzyme Q10 (COQ10) 100 MG capsule, Take 100 mg by mouth daily.  , Disp: , Rfl: ;  [facility] ASPIRIN LOW DOSE 81 MG EC tablet, TAKE 1 TABLET BY MOUTH DAILY. SWALLOW WHOLE., Disp: 30 tablet, Rfl: 8;  ibuprofen (ADVIL,MOTRIN) 400 MG tablet, TAKE 1 TABLET (400 MG TOTAL) BY MOUTH EVERY 6 (SIX) HOURS AS NEEDED FOR PAIN., Disp: 60 tablet, Rfl: 0 irbesartan-hydrochlorothiazide (AVALIDE) 300-12.5 MG per tablet, TAKE 1 TABLET BY MOUTH DAILY., Disp: 90 tablet, Rfl: 3;  KLOR-CON 10 10 MEQ tablet, TAKE 1 TABLET (10 MEQ TOTAL) BY MOUTH DAILY., Disp: 90 tablet, Rfl: 3;  naproxen sodium (ANAPROX) 220 MG tablet, Take 220 mg by mouth daily as needed.  , Disp: , Rfl: ;  pioglitazone (ACTOS) 15 MG tablet, TAKE 1 TABLET BY MOUTH EVERY DAY, Disp: 90 tablet, Rfl: 3 vitamin B-12 (CYANOCOBALAMIN) 1000 MCG tablet, Take 1,000 mcg by mouth daily.  , Disp: , Rfl: ;  potassium chloride (KLOR-CON 10) 10 MEQ tablet, Take 1 tablet (10 mEq total) by mouth daily., Disp: 90 tablet, Rfl: 3;  pravastatin (PRAVACHOL) 40 MG tablet, Take 1 tablet (40 mg total) by mouth every evening., Disp: 90 tablet, Rfl: 3  Allergies  Allergen Reactions  . Januvia [Sitagliptin Phosphate]     dizziness  . Metformin And Related Nausea Only    History  Substance Use Topics  . Smoking status: Never Smoker   . Smokeless tobacco: Not on file  . Alcohol Use: No    Family History  Problem Relation Age of Onset  . Hypertension Mother   . Hypertension Father   . Heart disease Sister     sudden death early 84's  . Stroke Sister   . Prostate cancer Brother     cancer  . Diabetes Other   . Joint hypermobility Other     DJD      Review of Systems  Constitutional: negative for fatigue and weight  loss Respiratory: negative for cough and wheezing Cardiovascular: negative for chest pain, fatigue and palpitations Gastrointestinal: negative for abdominal pain and change in bowel habits Musculoskeletal:negative for myalgias Neurological: negative for gait problems and tremors Behavioral/Psych: negative for abusive relationship, depression Endocrine: negative for temperature intolerance   Genitourinary:negative for genital lesions, hot flashes, sexual problems and vaginal discharge Integument/breast: negative for breast lump, breast tenderness, nipple discharge and skin lesion(s)    Objective:       BP 158/97 mmHg  Pulse 67  Temp(Src) 98.1 F (36.7 C)  Ht 4\' 11"  (1.499 m)  Wt 84.369 kg (186 lb)  BMI 37.55 kg/m2 General:   alert  Skin:   no rash or abnormalities  Lungs:   clear to auscultation bilaterally  Heart:   regular rate and rhythm, S1, S2 normal, no murmur, click, rub or gallop  Breasts:   normal without suspicious masses, skin or nipple changes or axillary nodes  Abdomen:  normal findings: no organomegaly, soft, non-tender and no hernia  Pelvis:  External genitalia: normal general appearance Urinary system: urethral meatus normal and bladder without fullness, nontender Vaginal: normal without tenderness, induration or masses Adnexa: normal bimanual exam    Lab Review  Labs reviewed no Radiologic studies reviewed no    Assessment:    Healthy female exam.    Plan:    Education reviewed: calcium supplements, low fat, low cholesterol diet and weight bearing exercise.   Follow up as needed.

## 2014-01-04 NOTE — Patient Instructions (Signed)

## 2014-01-12 ENCOUNTER — Other Ambulatory Visit: Payer: Self-pay

## 2014-01-12 DIAGNOSIS — Z1231 Encounter for screening mammogram for malignant neoplasm of breast: Secondary | ICD-10-CM

## 2014-01-28 LAB — HM DIABETES EYE EXAM

## 2014-02-17 ENCOUNTER — Ambulatory Visit
Admission: RE | Admit: 2014-02-17 | Discharge: 2014-02-17 | Disposition: A | Discharge status: Home / Self Care | Payer: Medicare Other | Source: Ambulatory Visit

## 2014-02-17 DIAGNOSIS — Z1231 Encounter for screening mammogram for malignant neoplasm of breast: Secondary | ICD-10-CM

## 2014-02-22 ENCOUNTER — Encounter: Payer: Self-pay | Admitting: *Deleted

## 2014-02-22 ENCOUNTER — Encounter: Payer: Self-pay | Admitting: Internal Medicine

## 2014-02-23 ENCOUNTER — Encounter: Payer: Self-pay | Admitting: Obstetrics & Gynecology

## 2014-03-05 ENCOUNTER — Other Ambulatory Visit: Payer: Self-pay | Admitting: Internal Medicine

## 2014-05-10 ENCOUNTER — Other Ambulatory Visit: Payer: Self-pay | Admitting: Internal Medicine

## 2014-06-08 ENCOUNTER — Ambulatory Visit (INDEPENDENT_AMBULATORY_CARE_PROVIDER_SITE_OTHER): Payer: Medicare Other | Admitting: Internal Medicine

## 2014-06-08 ENCOUNTER — Encounter: Payer: Self-pay | Admitting: Internal Medicine

## 2014-06-08 ENCOUNTER — Other Ambulatory Visit (INDEPENDENT_AMBULATORY_CARE_PROVIDER_SITE_OTHER): Payer: Medicare Other

## 2014-06-08 VITALS — BP 130/82 | HR 83 | Temp 97.9°F | Resp 18 | Ht 59.0 in | Wt 185.1 lb

## 2014-06-08 DIAGNOSIS — Z23 Encounter for immunization: Secondary | ICD-10-CM | POA: Diagnosis not present

## 2014-06-08 DIAGNOSIS — Z0189 Encounter for other specified special examinations: Secondary | ICD-10-CM

## 2014-06-08 DIAGNOSIS — E785 Hyperlipidemia, unspecified: Secondary | ICD-10-CM

## 2014-06-08 DIAGNOSIS — I10 Essential (primary) hypertension: Secondary | ICD-10-CM | POA: Diagnosis not present

## 2014-06-08 DIAGNOSIS — E119 Type 2 diabetes mellitus without complications: Secondary | ICD-10-CM

## 2014-06-08 DIAGNOSIS — Z0000 Encounter for general adult medical examination without abnormal findings: Secondary | ICD-10-CM

## 2014-06-08 LAB — HEPATIC FUNCTION PANEL
ALT: 11 U/L (ref 0–35)
AST: 15 U/L (ref 0–37)
Albumin: 4 g/dL (ref 3.5–5.2)
Alkaline Phosphatase: 117 U/L (ref 39–117)
Bilirubin, Direct: 0.1 mg/dL (ref 0.0–0.3)
Total Bilirubin: 0.5 mg/dL (ref 0.2–1.2)
Total Protein: 6.9 g/dL (ref 6.0–8.3)

## 2014-06-08 LAB — BASIC METABOLIC PANEL
BUN: 22 mg/dL (ref 6–23)
CO2: 30 mEq/L (ref 19–32)
Calcium: 9.3 mg/dL (ref 8.4–10.5)
Chloride: 107 mEq/L (ref 96–112)
Creatinine, Ser: 0.88 mg/dL (ref 0.40–1.20)
GFR: 81.82 mL/min (ref >60.00)
Glucose, Bld: 95 mg/dL (ref 70–99)
Potassium: 4 mEq/L (ref 3.5–5.1)
Sodium: 141 mEq/L (ref 135–145)

## 2014-06-08 LAB — LIPID PANEL
Cholesterol: 185 mg/dL (ref 0–200)
HDL: 55.8 mg/dL (ref >39.00)
LDL Cholesterol: 117 mg/dL — ABNORMAL HIGH (ref 0–99)
NonHDL: 129.2
Total CHOL/HDL Ratio: 3
Triglycerides: 60 mg/dL (ref 0.0–149.0)
VLDL: 12 mg/dL (ref 0.0–40.0)

## 2014-06-08 LAB — HEMOGLOBIN A1C: Hgb A1c MFr Bld: 5.8 % (ref 4.6–6.5)

## 2014-06-08 NOTE — Progress Notes (Signed)
Pre visit review using our clinic review tool, if applicable. No additional management support is needed unless otherwise documented below in the visit note. 

## 2014-06-08 NOTE — Assessment & Plan Note (Signed)
stable overall by history and exam, recent data reviewed with pt, and pt to continue medical treatment as before,  to f/u any worsening symptoms or concerns BP Readings from Last 3 Encounters:  06/08/14 130/82  01/04/14 158/97  12/04/13 122/80

## 2014-06-08 NOTE — Progress Notes (Signed)
Subjective:    Patient ID: Brandi Shaffer, female    DOB: 04/02/1945, 69 y.o.   MRN: [id number]  HPI  Here to f/u; overall doing ok,  Pt denies chest pain, increasing sob or doe, wheezing, orthopnea, PND, increased LE swelling, palpitations, dizziness or syncope.  Pt denies new neurological symptoms such as new headache, or facial or extremity weakness or numbness.  Pt denies polydipsia, polyuria, or low sugar episode.   Pt denies new neurological symptoms such as new headache, or facial or extremity weakness or numbness.   Pt states overall good compliance with meds, mostly trying to follow appropriate diet, with wt overall stable,  but little exercise however. Recent home a1c 6.3% yesterday per home nurse.   Wt Readings from Last 3 Encounters:  06/08/14 185 lb 1.3 oz (83.952 kg)  01/04/14 186 lb (84.369 kg)  12/04/13 187 lb (84.823 kg)   Past Medical History  Diagnosis Date  . ANEMIA-IRON DEFICIENCY 12/06/2009  . DIABETES MELLITUS, TYPE II 09/17/2006  . DIVERTICULOSIS, COLON 12/06/2009  . GENITAL HERPES 03/24/2010  . HYPERLIPIDEMIA 03/24/2010  . HYPERTENSION 09/17/2006  . HYPOTHYROIDISM 09/17/2006  . LIPOMA 12/06/2009  . PERIPHERAL EDEMA 03/24/2010  . GERD (gastroesophageal reflux disease) 06/28/2011  . Renal cyst 07/05/2011    1.9 cm minimally complex  . Asthma 12/05/2011   Past Surgical History  Procedure Laterality Date  . Tubal ligation    . Abdominal hysterectomy      fibroids  . Ankle surgury      x 3 left - chronic pain/swelling  . Tonsillectomy    . Shoulder surgery Left     reports that she has never smoked. She does not have any smokeless tobacco history on file. She reports that she does not drink alcohol or use illicit drugs. family history includes Diabetes in her other; Heart disease in her sister; Hypertension in her father and mother; Joint hypermobility in her other; Prostate cancer in her brother; Stroke in her sister. Allergies  Allergen Reactions  . Januvia  [Sitagliptin Phosphate]     dizziness  . Metformin And Related Nausea Only   Current Outpatient Prescriptions on File Prior to Visit  Medication Sig Dispense Refill  . albuterol (PROVENTIL HFA;VENTOLIN HFA) 108 (90 BASE) MCG/ACT inhaler Inhale 2 puffs into the lungs every 6 (six) hours as needed for wheezing. 1 Inhaler 11  . amLODipine (NORVASC) 5 MG tablet TAKE 1 TABLET EVERY DAY 90 tablet 3  . Ascorbic Acid (VITAMIN C) 500 MG tablet Take 500 mg by mouth daily.      Marland Kitchen aspirin 81 MG EC tablet TAKE 1 TABLET BY MOUTH DAILY. SWALLOW WHOLE. 30 tablet 11  . Cholecalciferol (VITAMIN D) 400 UNITS tablet Take 400 Units by mouth daily.      . Coenzyme Q10 (COQ10) 100 MG capsule Take 100 mg by mouth daily.      Marland Kitchen ibuprofen (ADVIL,MOTRIN) 400 MG tablet TAKE 1 TABLET (400 MG TOTAL) BY MOUTH EVERY 6 (SIX) HOURS AS NEEDED FOR PAIN. 60 tablet 0  . irbesartan-hydrochlorothiazide (AVALIDE) 300-12.5 MG per tablet TAKE 1 TABLET BY MOUTH DAILY. 90 tablet 3  . naproxen sodium (ANAPROX) 220 MG tablet Take 220 mg by mouth daily as needed.      . pioglitazone (ACTOS) 15 MG tablet TAKE 1 TABLET BY MOUTH EVERY DAY 90 tablet 3  . potassium chloride (KLOR-CON 10) 10 MEQ tablet Take 1 tablet (10 mEq total) by mouth daily. 90 tablet 3  . vitamin B-12 (  CYANOCOBALAMIN) 1000 MCG tablet Take 1,000 mcg by mouth daily.       No current facility-administered medications on file prior to visit.    Review of Systems  Constitutional: Negative for unusual diaphoresis or night sweats HENT: Negative for ringing in ear or discharge Eyes: Negative for double vision or worsening visual disturbance.  Respiratory: Negative for choking and stridor.   Gastrointestinal: Negative for vomiting or other signifcant bowel change Genitourinary: Negative for hematuria or change in urine volume.  Musculoskeletal: Negative for other MSK pain or swelling Skin: Negative for color change and worsening wound.  Neurological: Negative for tremors and  numbness other than noted  Psychiatric/Behavioral: Negative for decreased concentration or agitation other than above       Objective:   Physical Exam BP 130/82 mmHg  Pulse 83  Temp(Src) 97.9 F (36.6 C) (Oral)  Resp 18  Ht 4\' 11"  (1.499 m)  Wt 185 lb 1.3 oz (83.952 kg)  BMI 37.36 kg/m2  SpO2 96% VS noted,  Constitutional: Pt appears in no significant distress HENT: Head: NCAT.  Right Ear: External ear normal.  Left Ear: External ear normal.  Eyes: . Pupils are equal, round, and reactive to light. Conjunctivae and EOM are normal Neck: Normal range of motion. Neck supple.  Cardiovascular: Normal rate and regular rhythm.   Pulmonary/Chest: Effort normal and breath sounds without rales or wheezing.  Abd:  Soft, NT, ND, + BS Neurological: Pt is alert. Not confused , motor grossly intact Skin: Skin is warm. No rash, no LE edema Psychiatric: Pt behavior is normal. No agitation.     Assessment & Plan:

## 2014-06-08 NOTE — Assessment & Plan Note (Signed)
stable overall by history and exam, recent data reviewed with pt, and pt to continue medical treatment as before,  to f/u any worsening symptoms or concerns Lab Results  Component Value Date   LDLCALC 117* 12/04/2013

## 2014-06-08 NOTE — Patient Instructions (Addendum)
You had the tetanus shot today (Td)  Please continue all other medications as before, and refills have been done if requested.  Please have the pharmacy call with any other refills you may need.  Please continue your efforts at being more active, low cholesterol diet, and weight control.  You are otherwise up to date with prevention measures today.  Please keep your appointments with your specialists as you may have planned  Please go to the LAB in the Basement (turn left off the elevator) for the tests to be done today  You will be contacted by phone if any changes need to be made immediately.  Otherwise, you will receive a letter about your results with an explanation, but please check with MyChart first.  Please remember to sign up for MyChart if you have not done so, as this will be important to you in the future with finding out test results, communicating by private email, and scheduling acute appointments online when needed.  Please return in 6 months, or sooner if needed, with Lab testing done 3-5 days before

## 2014-06-08 NOTE — Assessment & Plan Note (Signed)
stable overall by history and exam, recent data reviewed with pt, and pt to continue medical treatment as before,  to f/u any worsening symptoms or concerns Lab Results  Component Value Date   HGBA1C 6.0 12/04/2013   For /fu labs

## 2014-06-21 ENCOUNTER — Telehealth: Payer: Self-pay | Admitting: Internal Medicine

## 2014-06-21 MED ORDER — ONETOUCH DELICA LANCETS 33G MISC: 1.0000 | Freq: Two times a day (BID) | Status: DC

## 2014-06-21 MED ORDER — GLUCOSE BLOOD VI STRP: 1.0000 | ORAL_STRIP | Freq: Two times a day (BID) | Status: DC

## 2014-06-21 NOTE — Telephone Encounter (Signed)
She called regarding ordering diabetic strips and said we needed to know which machine she has. She has a one touch verio machine. Please call patient to verify.

## 2014-06-21 NOTE — Telephone Encounter (Signed)
Notified pt supplies sent to [facility].../lmb

## 2014-06-21 NOTE — Telephone Encounter (Signed)
Patient received a phone call asking about when her last eye exam was. She said it was December of 2015 and not due back till December 2016. They did not give her glasses. She has an appointment in May for glasses.

## 2014-06-28 DIAGNOSIS — E119 Type 2 diabetes mellitus without complications: Secondary | ICD-10-CM | POA: Diagnosis not present

## 2014-06-28 LAB — HM DIABETES EYE EXAM

## 2014-06-29 ENCOUNTER — Ambulatory Visit (INDEPENDENT_AMBULATORY_CARE_PROVIDER_SITE_OTHER): Payer: Medicare Other

## 2014-06-29 VITALS — BP 134/74 | Ht 59.0 in | Wt 193.8 lb

## 2014-06-29 DIAGNOSIS — Z0000 Encounter for general adult medical examination without abnormal findings: Secondary | ICD-10-CM

## 2014-06-29 NOTE — Progress Notes (Signed)
Subjective:   Brandi Shaffer is a 69 y.o. female who presents for Medicare Annual (Subsequent) preventive examination.  Review of Systems: Review list of problems HRA assessment completed during visit  Health better, the same or worse than last year? Better than last year/ Had shoulder shoulder; on left;  78% use of arm  Health described as excellent; very good; good; fair or poor? Very good  Current Exercise; Up early; getting children to school; walks a lot; States she is walking 5 miles in the park when she walks.   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; a and t forum and teach food techniques  Dental: q year to expensive Referred to [facility])   Generalized Safety in the home reviewed; smoke alarm; will fup with fire dept Fall Risk and general Safety reviewed;  Assess risk of falling; does not climb Educated on prevention falls; Exercise, toning and strengthening; Balance exercises;  Does not swim;  Comfortable shoes Stairs; Doesn't climb much; broke ankle fell going to work;   Home safety; removal of throw rugs; bathroom handrails; Unlevel surfaces outside; stumps;  Avoiding ice; climbing on ladders Getting up and down slowly Bone density scan as appropriate Calcium and Vit D as appropriate   Other Safety Review Firearm safety as appropriate;  Assessed for community safety Emergency Plan for illness or other: yes Driving: Safety discussed; no accidents Sun protection  Lifeline: http://www.lifelinesys.com/content/home; 828-450-5965 [phone]     Current Care Team reviewed and updated          Objective:     Vitals: BP 134/74 mmHg  Ht 4\' 11"  (1.499 m)  Wt 193 lb 12 oz (87.884 kg)  BMI 39.11 kg/m2  Tobacco History    Smoking status  . Never Smoker   Smokeless tobacco  . Not on file     Counseling given: Not Answered   Past Medical History  Diagnosis Date  . ANEMIA-IRON DEFICIENCY 12/06/2009  . DIABETES MELLITUS, TYPE II 09/17/2006  . DIVERTICULOSIS, COLON 12/06/2009  . GENITAL HERPES 03/24/2010  . HYPERLIPIDEMIA 03/24/2010  . HYPERTENSION 09/17/2006  . HYPOTHYROIDISM 09/17/2006  . LIPOMA 12/06/2009  . PERIPHERAL EDEMA 03/24/2010  . GERD (gastroesophageal reflux disease) 06/28/2011  . Renal cyst 07/05/2011    1.9 cm minimally complex  . Asthma 12/05/2011   Past Surgical History  Procedure Laterality Date  . Tubal ligation    . Abdominal hysterectomy      fibroids  . Ankle surgury      x 3 left - chronic pain/swelling  . Tonsillectomy    . Shoulder surgery Left    Family History  Problem Relation Age of Onset  . Hypertension Mother   . Hypertension Father   . Heart disease Sister     sudden death early 55's  . Stroke Sister   . Prostate cancer Brother     cancer  . Diabetes Other   . Joint hypermobility Other     DJD   History  Sexual Activity  . Sexual Activity: Yes    Outpatient Encounter Prescriptions as of 06/29/2014  . Order #: [id number] Class: Normal  . Order #: [id number] Class: Normal  . Order #: [id number] Class: Historical Med  . Order #: [id number] Class: Normal  .  Order #: [id number] Class: Historical Med  . Order #: [id number] Class: Historical Med  . Order #: [id number] Class: Fax  . Order #: [id number] Class: Normal  . Order #: [id number] Class: Normal  . Order #: [id number] Class: Historical Med  . Order #: [id number] Class: Fax  . Order #: [id number] Class: Normal  . Order #: [id number] Class: Normal  . Order #: [id number] Class: Historical Med    Activities of Daily Living No flowsheet data found.  Patient Care Team: Biagio Borg, MD as PCP - General    Assessment:    CC risk: obesity (BMI); family hx; diabetes; HTN; hyperlipidemia;\  Personalized Education given  regarding: weight and exercise; A1c 5.8 good understanding of diet; Will pursue exercise for enjoyment     Fall Risk and general Safety reviewed Assess fear of falling?no Educated on prevention falls; Exercise, toning and strengthening; Balance exercises  Comfortable shoes Regular vision checks Home safety; removal of throw rugs; bathroom handrails;  Smoke detectors; safe environment Avoid climbing on BP medication Bone density scan as appropriate Calcium and Vit D as appropriate Any changes in medication   Stress: some with dtr but working on this and setting boundaries  CC risk osteo: Discussed; had bone density post fx 69 yo Risk for hepatitis or high risk social behavior: already reviewed in the past  Risk for Depression: no  Risk for Falls:no  Safety assessed (driving issues; vision; home; environment; support)  Cognition assessed by AD8; Score 0 (A score of 2 or greater would indicate the MMSE be completed)    Need for Immunizations or other screenings identified;   (CDC recommmend Prevnar at 65 followed by pnuemovax 23 in one year or 5 years after the last dose. Discussed shingles Educated to check with insurance regarding coverage of Shingles vaccination on Part D or Part B and may have lower co-pay if provided on the Part D side    Health Maintenance up to date and a Preventive Wellness Plan was given to the patient  Exercise Activities and Dietary recommendations    Goals    None     Fall Risk Fall Risk  12/04/2013 09/18/2013 06/04/2013  Falls in the past year? No No Yes  Number falls in past yr: - - 1  Injury with Fall? - - No   Depression Screen PHQ 2/9 Scores 12/04/2013 09/18/2013 06/04/2013  PHQ - 2 Score 1 1 1      Cognitive Testing No flowsheet data found.  Immunization History  Administered Date(s) Administered  . Influenza Split 11/21/2010, 12/05/2011  . Influenza Whole 12/06/2009  . Influenza, High Dose Seasonal PF 12/04/2012, 12/04/2013  .  Pneumococcal Conjugate-13 12/19/2012  . Pneumococcal Polysaccharide-23 11/27/2004, 12/06/2009  . Td 11/27/2004, 06/08/2014   Screening Tests Health Maintenance  Topic Date Due  . ZOSTAVAX  12/17/2004  . FOOT EXAM  09/19/2014  . INFLUENZA VACCINE  09/27/2014  . URINE MICROALBUMIN  12/05/2014  . PNA vac Low Risk Adult (2 of 2 - PPSV23) 12/07/2014  . HEMOGLOBIN A1C  12/08/2014  . OPHTHALMOLOGY EXAM  01/29/2015  . MAMMOGRAM  02/18/2016  . COLONOSCOPY  01/17/2020  . TETANUS/TDAP  06/07/2024  . DEXA SCAN  Completed      Plan:  Plan   The patient agrees to: Try to schedule exercise and silver sneakers/ may try zumba  Advanced directive:Has given her wishes to family, youngest sister       During the course of the visit the patient was educated  and counseled about the following appropriate screening and preventive services:   Vaccines to include Pneumoccal, Influenza, Hepatitis B, Td, Zostavax, HCV/ screen completed  Electrocardiogram; deferred  Cardiovascular Disease; educated regarding risk; weight loss, but prefers exercise to assist with meeting goals  Colorectal cancer screening; not due  Bone density screening; 2011 post fracture to ankle; does not feel a risk at this time; Not overdue per Epic  Diabetes screening/ A1c 5.8; monitors diet  Glaucoma screening; completed  Mammography/PAP; has annually  Nutrition counseling; goes to grand-parents meeting had has adapted cooking habits to backing and grilling  Referred to Fire department or [facility] for volunteer to assist with smoke alarm.  Dental work needed; cleaning teeth is to expensive; referred to Agricultural consultant program  Patient Instructions (the written plan) was given to the patient.   UPBDH,DIXBO, RN  06/29/2014     Medical screening examination/treatment/procedure(s) were performed by non-physician practitioner and as supervising physician I was immediately available for  consultation/collaboration. I agree with above. Cathlean Cower, MD

## 2014-06-29 NOTE — Patient Instructions (Signed)
Brandi Shaffer , Thank you for taking time to come for your Medicare Wellness Visit. I appreciate your ongoing commitment to your health goals. Please review the following plan we discussed and let me know if I can assist you in the future.   These are the goals we discussed: Goals    . Weight < 200 lb (90.719 kg)     Walks a lot and prays./ get on machines;  Silver sneakers and the to the gym Mid-morning; is good/ may try zumba;         This is a list of the screening recommended for you and due dates:  Health Maintenance  Topic Date Due  . Shingles Vaccine  12/17/2004  . Complete foot exam   09/19/2014  . Flu Shot  09/27/2014  . Urine Protein Check  12/05/2014  . Pneumonia vaccines (2 of 2 - PPSV23) 12/07/2014  . Hemoglobin A1C  12/08/2014  . Eye exam for diabetics  01/29/2015  . Mammogram  02/18/2016  . Colon Cancer Screening  01/17/2020  . Tetanus Vaccine  06/07/2024  . DEXA scan (bone density measurement)  Completed   Discussed shingles/ will check with insurance  Will attempt zumba Preventive Care for Adults A healthy lifestyle and preventive care can promote health and wellness. Preventive health guidelines for women include the following key practices.  A routine yearly physical is a good way to check with your health care provider about your health and preventive screening. It is a chance to share any concerns and updates on your health and to receive a thorough exam.  Visit your dentist for a routine exam and preventive care every 6 months. Brush your teeth twice a day and floss once a day. Good oral hygiene prevents tooth decay and gum disease.  The frequency of eye exams is based on your age, health, family medical history, use of contact lenses, and other factors. Follow your health care provider's recommendations for frequency of eye exams.  Eat a healthy diet. Foods like vegetables, fruits, whole grains, low-fat dairy products, and lean protein foods contain the  nutrients you need without too many calories. Decrease your intake of foods high in solid fats, added sugars, and salt. Eat the right amount of calories for you.Get information about a proper diet from your health care provider, if necessary.  Regular physical exercise is one of the most important things you can do for your health. Most adults should get at least 150 minutes of moderate-intensity exercise (any activity that increases your heart rate and causes you to sweat) each week. In addition, most adults need muscle-strengthening exercises on 2 or more days a week.  Maintain a healthy weight. The body mass index (BMI) is a screening tool to identify possible weight problems. It provides an estimate of body fat based on height and weight. Your health care provider can find your BMI and can help you achieve or maintain a healthy weight.For adults 20 years and older:  A BMI below 18.5 is considered underweight.  A BMI of 18.5 to 24.9 is normal.  A BMI of 25 to 29.9 is considered overweight.  A BMI of 30 and above is considered obese.  Maintain normal blood lipids and cholesterol levels by exercising and minimizing your intake of saturated fat. Eat a balanced diet with plenty of fruit and vegetables. Blood tests for lipids and cholesterol should begin at age 8 and be repeated every 5 years. If your lipid or cholesterol levels are high, you  are over 44, or you are at high risk for heart disease, you may need your cholesterol levels checked more frequently.Ongoing high lipid and cholesterol levels should be treated with medicines if diet and exercise are not working.  If you smoke, find out from your health care provider how to quit. If you do not use tobacco, do not start.  Lung cancer screening is recommended for adults aged 30-80 years who are at high risk for developing lung cancer because of a history of smoking. A yearly low-dose CT scan of the lungs is recommended for people who have at  least a 30-pack-year history of smoking and are a current smoker or have quit within the past 15 years. A pack year of smoking is smoking an average of 1 pack of cigarettes a day for 1 year (for example: 1 pack a day for 30 years or 2 packs a day for 15 years). Yearly screening should continue until the smoker has stopped smoking for at least 15 years. Yearly screening should be stopped for people who develop a health problem that would prevent them from having lung cancer treatment.  If you are pregnant, do not drink alcohol. If you are breastfeeding, be very cautious about drinking alcohol. If you are not pregnant and choose to drink alcohol, do not have more than 1 drink per day. One drink is considered to be 12 ounces (355 mL) of beer, 5 ounces (148 mL) of wine, or 1.5 ounces (44 mL) of liquor.  Avoid use of street drugs. Do not share needles with anyone. Ask for help if you need support or instructions about stopping the use of drugs.  High blood pressure causes heart disease and increases the risk of stroke. Your blood pressure should be checked at least every 1 to 2 years. Ongoing high blood pressure should be treated with medicines if weight loss and exercise do not work.  If you are 84-80 years old, ask your health care provider if you should take aspirin to prevent strokes.  Diabetes screening involves taking a blood sample to check your fasting blood sugar level. This should be done once every 3 years, after age 81, if you are within normal weight and without risk factors for diabetes. Testing should be considered at a younger age or be carried out more frequently if you are overweight and have at least 1 risk factor for diabetes.  Breast cancer screening is essential preventive care for women. You should practice "breast self-awareness." This means understanding the normal appearance and feel of your breasts and may include breast self-examination. Any changes detected, no matter how small,  should be reported to a health care provider. Women in their 44s and 30s should have a clinical breast exam (CBE) by a health care provider as part of a regular health exam every 1 to 3 years. After age 71, women should have a CBE every year. Starting at age 56, women should consider having a mammogram (breast X-ray test) every year. Women who have a family history of breast cancer should talk to their health care provider about genetic screening. Women at a high risk of breast cancer should talk to their health care providers about having an MRI and a mammogram every year.  Breast cancer gene (BRCA)-related cancer risk assessment is recommended for women who have family members with BRCA-related cancers. BRCA-related cancers include breast, ovarian, tubal, and peritoneal cancers. Having family members with these cancers may be associated with an increased risk for harmful  changes (mutations) in the breast cancer genes BRCA1 and BRCA2. Results of the assessment will determine the need for genetic counseling and BRCA1 and BRCA2 testing.  Routine pelvic exams to screen for cancer are no longer recommended for nonpregnant women who are considered low risk for cancer of the pelvic organs (ovaries, uterus, and vagina) and who do not have symptoms. Ask your health care provider if a screening pelvic exam is right for you.  If you have had past treatment for cervical cancer or a condition that could lead to cancer, you need Pap tests and screening for cancer for at least 20 years after your treatment. If Pap tests have been discontinued, your risk factors (such as having a new sexual partner) need to be reassessed to determine if screening should be resumed. Some women have medical problems that increase the chance of getting cervical cancer. In these cases, your health care provider may recommend more frequent screening and Pap tests.  The HPV test is an additional test that may be used for cervical cancer  screening. The HPV test looks for the virus that can cause the cell changes on the cervix. The cells collected during the Pap test can be tested for HPV. The HPV test could be used to screen women aged 24 years and older, and should be used in women of any age who have unclear Pap test results. After the age of 64, women should have HPV testing at the same frequency as a Pap test.  Colorectal cancer can be detected and often prevented. Most routine colorectal cancer screening begins at the age of 54 years and continues through age 18 years. However, your health care provider may recommend screening at an earlier age if you have risk factors for colon cancer. On a yearly basis, your health care provider may provide home test kits to check for hidden blood in the stool. Use of a small camera at the end of a tube, to directly examine the colon (sigmoidoscopy or colonoscopy), can detect the earliest forms of colorectal cancer. Talk to your health care provider about this at age 52, when routine screening begins. Direct exam of the colon should be repeated every 5-10 years through age 12 years, unless early forms of pre-cancerous polyps or small growths are found.  People who are at an increased risk for hepatitis B should be screened for this virus. You are considered at high risk for hepatitis B if:  You were born in a country where hepatitis B occurs often. Talk with your health care provider about which countries are considered high risk.  Your parents were born in a high-risk country and you have not received a shot to protect against hepatitis B (hepatitis B vaccine).  You have HIV or AIDS.  You use needles to inject street drugs.  You live with, or have sex with, someone who has hepatitis B.  You get hemodialysis treatment.  You take certain medicines for conditions like cancer, organ transplantation, and autoimmune conditions.  Hepatitis C blood testing is recommended for all people born from  [birth year] through 1966 and any individual with known risks for hepatitis C.  Practice safe sex. Use condoms and avoid high-risk sexual practices to reduce the spread of sexually transmitted infections (STIs). STIs include gonorrhea, chlamydia, syphilis, trichomonas, herpes, HPV, and human immunodeficiency virus (HIV). Herpes, HIV, and HPV are viral illnesses that have no cure. They can result in disability, cancer, and death.  You should be screened for sexually transmitted  illnesses (STIs) including gonorrhea and chlamydia if:  You are sexually active and are younger than 24 years.  You are older than 24 years and your health care provider tells you that you are at risk for this type of infection.  Your sexual activity has changed since you were last screened and you are at an increased risk for chlamydia or gonorrhea. Ask your health care provider if you are at risk.  If you are at risk of being infected with HIV, it is recommended that you take a prescription medicine daily to prevent HIV infection. This is called preexposure prophylaxis (PrEP). You are considered at risk if:  You are a heterosexual woman, are sexually active, and are at increased risk for HIV infection.  You take drugs by injection.  You are sexually active with a partner who has HIV.  Talk with your health care provider about whether you are at high risk of being infected with HIV. If you choose to begin PrEP, you should first be tested for HIV. You should then be tested every 3 months for as long as you are taking PrEP.  Osteoporosis is a disease in which the bones lose minerals and strength with aging. This can result in serious bone fractures or breaks. The risk of osteoporosis can be identified using a bone density scan. Women ages 31 years and over and women at risk for fractures or osteoporosis should discuss screening with their health care providers. Ask your health care provider whether you should take a calcium  supplement or vitamin D to reduce the rate of osteoporosis.  Menopause can be associated with physical symptoms and risks. Hormone replacement therapy is available to decrease symptoms and risks. You should talk to your health care provider about whether hormone replacement therapy is right for you.  Use sunscreen. Apply sunscreen liberally and repeatedly throughout the day. You should seek shade when your shadow is shorter than you. Protect yourself by wearing long sleeves, pants, a wide-brimmed hat, and sunglasses year round, whenever you are outdoors.  Once a month, do a whole body skin exam, using a mirror to look at the skin on your back. Tell your health care provider of new moles, moles that have irregular borders, moles that are larger than a pencil eraser, or moles that have changed in shape or color.  Stay current with required vaccines (immunizations).  Influenza vaccine. All adults should be immunized every year.  Tetanus, diphtheria, and acellular pertussis (Td, Tdap) vaccine. Pregnant women should receive 1 dose of Tdap vaccine during each pregnancy. The dose should be obtained regardless of the length of time since the last dose. Immunization is preferred during the 27th-36th week of gestation. An adult who has not previously received Tdap or who does not know her vaccine status should receive 1 dose of Tdap. This initial dose should be followed by tetanus and diphtheria toxoids (Td) booster doses every 10 years. Adults with an unknown or incomplete history of completing a 3-dose immunization series with Td-containing vaccines should begin or complete a primary immunization series including a Tdap dose. Adults should receive a Td booster every 10 years.  Varicella vaccine. An adult without evidence of immunity to varicella should receive 2 doses or a second dose if she has previously received 1 dose. Pregnant females who do not have evidence of immunity should receive the first dose  after pregnancy. This first dose should be obtained before leaving the health care facility. The second dose should be obtained  4-8 weeks after the first dose.  Human papillomavirus (HPV) vaccine. Females aged 13-26 years who have not received the vaccine previously should obtain the 3-dose series. The vaccine is not recommended for use in pregnant females. However, pregnancy testing is not needed before receiving a dose. If a female is found to be pregnant after receiving a dose, no treatment is needed. In that case, the remaining doses should be delayed until after the pregnancy. Immunization is recommended for any person with an immunocompromised condition through the age of 76 years if she did not get any or all doses earlier. During the 3-dose series, the second dose should be obtained 4-8 weeks after the first dose. The third dose should be obtained 24 weeks after the first dose and 16 weeks after the second dose.  Zoster vaccine. One dose is recommended for adults aged 53 years or older unless certain conditions are present.  Measles, mumps, and rubella (MMR) vaccine. Adults born before [birth year] generally are considered immune to measles and mumps. Adults born in [birth year] or later should have 1 or more doses of MMR vaccine unless there is a contraindication to the vaccine or there is laboratory evidence of immunity to each of the three diseases. A routine second dose of MMR vaccine should be obtained at least 28 days after the first dose for students attending postsecondary schools, health care workers, or international travelers. People who received inactivated measles vaccine or an unknown type of measles vaccine during 1963-1967 should receive 2 doses of MMR vaccine. People who received inactivated mumps vaccine or an unknown type of mumps vaccine before 1979 and are at high risk for mumps infection should consider immunization with 2 doses of MMR vaccine. For females of childbearing age, rubella immunity  should be determined. If there is no evidence of immunity, females who are not pregnant should be vaccinated. If there is no evidence of immunity, females who are pregnant should delay immunization until after pregnancy. Unvaccinated health care workers born before [birth year] who lack laboratory evidence of measles, mumps, or rubella immunity or laboratory confirmation of disease should consider measles and mumps immunization with 2 doses of MMR vaccine or rubella immunization with 1 dose of MMR vaccine.  Pneumococcal 13-valent conjugate (PCV13) vaccine. When indicated, a person who is uncertain of her immunization history and has no record of immunization should receive the PCV13 vaccine. An adult aged 47 years or older who has certain medical conditions and has not been previously immunized should receive 1 dose of PCV13 vaccine. This PCV13 should be followed with a dose of pneumococcal polysaccharide (PPSV23) vaccine. The PPSV23 vaccine dose should be obtained at least 8 weeks after the dose of PCV13 vaccine. An adult aged 56 years or older who has certain medical conditions and previously received 1 or more doses of PPSV23 vaccine should receive 1 dose of PCV13. The PCV13 vaccine dose should be obtained 1 or more years after the last PPSV23 vaccine dose.  Pneumococcal polysaccharide (PPSV23) vaccine. When PCV13 is also indicated, PCV13 should be obtained first. All adults aged 71 years and older should be immunized. An adult younger than age 68 years who has certain medical conditions should be immunized. Any person who resides in a nursing home or long-term care facility should be immunized. An adult smoker should be immunized. People with an immunocompromised condition and certain other conditions should receive both PCV13 and PPSV23 vaccines. People with human immunodeficiency virus (HIV) infection should be immunized as soon as possible after  diagnosis. Immunization during chemotherapy or radiation therapy  should be avoided. Routine use of PPSV23 vaccine is not recommended for American Indians, [place] Natives, or people younger than 65 years unless there are medical conditions that require PPSV23 vaccine. When indicated, people who have unknown immunization and have no record of immunization should receive PPSV23 vaccine. One-time revaccination 5 years after the first dose of PPSV23 is recommended for people aged 19-64 years who have chronic kidney failure, nephrotic syndrome, asplenia, or immunocompromised conditions. People who received 1-2 doses of PPSV23 before age 34 years should receive another dose of PPSV23 vaccine at age 59 years or later if at least 5 years have passed since the previous dose. Doses of PPSV23 are not needed for people immunized with PPSV23 at or after age 26 years.  Meningococcal vaccine. Adults with asplenia or persistent complement component deficiencies should receive 2 doses of quadrivalent meningococcal conjugate (MenACWY-D) vaccine. The doses should be obtained at least 2 months apart. Microbiologists working with certain meningococcal bacteria, [employer] recruits, people at risk during an outbreak, and people who travel to or live in countries with a high rate of meningitis should be immunized. A first-year college student up through age 43 years who is living in a residence hall should receive a dose if she did not receive a dose on or after her 16th birthday. Adults who have certain high-risk conditions should receive one or more doses of vaccine.  Hepatitis A vaccine. Adults who wish to be protected from this disease, have certain high-risk conditions, work with hepatitis A-infected animals, work in hepatitis A research labs, or travel to or work in countries with a high rate of hepatitis A should be immunized. Adults who were previously unvaccinated and who anticipate close contact with an international adoptee during the first 60 days after arrival in the [place] from a  country with a high rate of hepatitis A should be immunized.  Hepatitis B vaccine. Adults who wish to be protected from this disease, have certain high-risk conditions, may be exposed to blood or other infectious body fluids, are household contacts or sex partners of hepatitis B positive people, are clients or workers in certain care facilities, or travel to or work in countries with a high rate of hepatitis B should be immunized.  Haemophilus influenzae type b (Hib) vaccine. A previously unvaccinated person with asplenia or sickle cell disease or having a scheduled splenectomy should receive 1 dose of Hib vaccine. Regardless of previous immunization, a recipient of a hematopoietic stem cell transplant should receive a 3-dose series 6-12 months after her successful transplant. Hib vaccine is not recommended for adults with HIV infection. Preventive Services / Frequency Ages 70 to 60 years  Blood pressure check.** / Every 1 to 2 years.  Lipid and cholesterol check.** / Every 5 years beginning at age 73.  Clinical breast exam.** / Every 3 years for women in their 25s and 86s.  BRCA-related cancer risk assessment.** / For women who have family members with a BRCA-related cancer (breast, ovarian, tubal, or peritoneal cancers).  Pap test.** / Every 2 years from ages 76 through 13. Every 3 years starting at age 58 through age 87 or 39 with a history of 3 consecutive normal Pap tests.  HPV screening.** / Every 3 years from ages 63 through ages 9 to 24 with a history of 3 consecutive normal Pap tests.  Hepatitis C blood test.** / For any individual with known risks for hepatitis C.  Skin self-exam. /  Monthly.  Influenza vaccine. / Every year.  Tetanus, diphtheria, and acellular pertussis (Tdap, Td) vaccine.** / Consult your health care provider. Pregnant women should receive 1 dose of Tdap vaccine during each pregnancy. 1 dose of Td every 10 years.  Varicella vaccine.** / Consult your health  care provider. Pregnant females who do not have evidence of immunity should receive the first dose after pregnancy.  HPV vaccine. / 3 doses over 6 months, if 57 and younger. The vaccine is not recommended for use in pregnant females. However, pregnancy testing is not needed before receiving a dose.  Measles, mumps, rubella (MMR) vaccine.** / You need at least 1 dose of MMR if you were born in 1957 or later. You may also need a 2nd dose. For females of childbearing age, rubella immunity should be determined. If there is no evidence of immunity, females who are not pregnant should be vaccinated. If there is no evidence of immunity, females who are pregnant should delay immunization until after pregnancy.  Pneumococcal 13-valent conjugate (PCV13) vaccine.** / Consult your health care provider.  Pneumococcal polysaccharide (PPSV23) vaccine.** / 1 to 2 doses if you smoke cigarettes or if you have certain conditions.  Meningococcal vaccine.** / 1 dose if you are age 84 to 33 years and a [employer] researcher living in a residence hall, or have one of several medical conditions, you need to get vaccinated against meningococcal disease. You may also need additional booster doses.  Hepatitis A vaccine.** / Consult your health care provider.  Hepatitis B vaccine.** / Consult your health care provider.  Haemophilus influenzae type b (Hib) vaccine.** / Consult your health care provider. Ages 29 to 78 years  Blood pressure check.** / Every 1 to 2 years.  Lipid and cholesterol check.** / Every 5 years beginning at age 43 years.  Lung cancer screening. / Every year if you are aged 87-80 years and have a 30-pack-year history of smoking and currently smoke or have quit within the past 15 years. Yearly screening is stopped once you have quit smoking for at least 15 years or develop a health problem that would prevent you from having lung cancer treatment.  Clinical breast exam.** / Every year after age  16 years.  BRCA-related cancer risk assessment.** / For women who have family members with a BRCA-related cancer (breast, ovarian, tubal, or peritoneal cancers).  Mammogram.** / Every year beginning at age 74 years and continuing for as long as you are in good health. Consult with your health care provider.  Pap test.** / Every 3 years starting at age 76 years through age 16 or 15 years with a history of 3 consecutive normal Pap tests.  HPV screening.** / Every 3 years from ages 79 years through ages 28 to 69 years with a history of 3 consecutive normal Pap tests.  Fecal occult blood test (FOBT) of stool. / Every year beginning at age 72 years and continuing until age 4 years. You may not need to do this test if you get a colonoscopy every 10 years.  Flexible sigmoidoscopy or colonoscopy.** / Every 5 years for a flexible sigmoidoscopy or every 10 years for a colonoscopy beginning at age 63 years and continuing until age 57 years.  Hepatitis C blood test.** / For all people born from [birth year] through 1966 and any individual with known risks for hepatitis C.  Skin self-exam. / Monthly.  Influenza vaccine. / Every year.  Tetanus, diphtheria, and acellular pertussis (Tdap/Td) vaccine.** / Consult your health  care provider. Pregnant women should receive 1 dose of Tdap vaccine during each pregnancy. 1 dose of Td every 10 years.  Varicella vaccine.** / Consult your health care provider. Pregnant females who do not have evidence of immunity should receive the first dose after pregnancy.  Zoster vaccine.** / 1 dose for adults aged 26 years or older.  Measles, mumps, rubella (MMR) vaccine.** / You need at least 1 dose of MMR if you were born in 1957 or later. You may also need a 2nd dose. For females of childbearing age, rubella immunity should be determined. If there is no evidence of immunity, females who are not pregnant should be vaccinated. If there is no evidence of immunity, females who are  pregnant should delay immunization until after pregnancy.  Pneumococcal 13-valent conjugate (PCV13) vaccine.** / Consult your health care provider.  Pneumococcal polysaccharide (PPSV23) vaccine.** / 1 to 2 doses if you smoke cigarettes or if you have certain conditions.  Meningococcal vaccine.** / Consult your health care provider.  Hepatitis A vaccine.** / Consult your health care provider.  Hepatitis B vaccine.** / Consult your health care provider.  Haemophilus influenzae type b (Hib) vaccine.** / Consult your health care provider. Ages 35 years and over  Blood pressure check.** / Every 1 to 2 years.  Lipid and cholesterol check.** / Every 5 years beginning at age 62 years.  Lung cancer screening. / Every year if you are aged 60-80 years and have a 30-pack-year history of smoking and currently smoke or have quit within the past 15 years. Yearly screening is stopped once you have quit smoking for at least 15 years or develop a health problem that would prevent you from having lung cancer treatment.  Clinical breast exam.** / Every year after age 60 years.  BRCA-related cancer risk assessment.** / For women who have family members with a BRCA-related cancer (breast, ovarian, tubal, or peritoneal cancers).  Mammogram.** / Every year beginning at age 84 years and continuing for as long as you are in good health. Consult with your health care provider.  Pap test.** / Every 3 years starting at age 82 years through age 85 or 25 years with 3 consecutive normal Pap tests. Testing can be stopped between 65 and 70 years with 3 consecutive normal Pap tests and no abnormal Pap or HPV tests in the past 10 years.  HPV screening.** / Every 3 years from ages 47 years through ages 21 or 8 years with a history of 3 consecutive normal Pap tests. Testing can be stopped between 65 and 70 years with 3 consecutive normal Pap tests and no abnormal Pap or HPV tests in the past 10 years.  Fecal occult blood  test (FOBT) of stool. / Every year beginning at age 31 years and continuing until age 74 years. You may not need to do this test if you get a colonoscopy every 10 years.  Flexible sigmoidoscopy or colonoscopy.** / Every 5 years for a flexible sigmoidoscopy or every 10 years for a colonoscopy beginning at age 31 years and continuing until age 58 years.  Hepatitis C blood test.** / For all people born from [birth year] through 1966 and any individual with known risks for hepatitis C.  Osteoporosis screening.** / A one-time screening for women ages 47 years and over and women at risk for fractures or osteoporosis.  Skin self-exam. / Monthly.  Influenza vaccine. / Every year.  Tetanus, diphtheria, and acellular pertussis (Tdap/Td) vaccine.** / 1 dose of Td every 10 years.  Varicella vaccine.** / Consult your health care provider.  Zoster vaccine.** / 1 dose for adults aged 64 years or older.  Pneumococcal 13-valent conjugate (PCV13) vaccine.** / Consult your health care provider.  Pneumococcal polysaccharide (PPSV23) vaccine.** / 1 dose for all adults aged 9 years and older.  Meningococcal vaccine.** / Consult your health care provider.  Hepatitis A vaccine.** / Consult your health care provider.  Hepatitis B vaccine.** / Consult your health care provider.  Haemophilus influenzae type b (Hib) vaccine.** / Consult your health care provider. ** Family history and personal history of risk and conditions may change your health care provider's recommendations. Document Released: 04/10/2001 Document Revised: 06/29/2013 Document Reviewed: 07/10/2010 [facility] Patient Information 2015 [place], [place]. This information is not intended to replace advice given to you by your health care provider. Make sure you discuss any questions you have with your health care provider.

## 2014-07-11 ENCOUNTER — Other Ambulatory Visit: Payer: Self-pay | Admitting: Internal Medicine

## 2014-07-14 ENCOUNTER — Encounter: Payer: Self-pay | Admitting: Internal Medicine

## 2014-07-14 ENCOUNTER — Ambulatory Visit (INDEPENDENT_AMBULATORY_CARE_PROVIDER_SITE_OTHER): Payer: Medicare Other | Admitting: Internal Medicine

## 2014-07-14 VITALS — BP 126/82 | HR 75 | Temp 97.8°F | Wt 192.1 lb

## 2014-07-14 DIAGNOSIS — E119 Type 2 diabetes mellitus without complications: Secondary | ICD-10-CM | POA: Diagnosis not present

## 2014-07-14 DIAGNOSIS — R062 Wheezing: Secondary | ICD-10-CM | POA: Diagnosis not present

## 2014-07-14 DIAGNOSIS — J209 Acute bronchitis, unspecified: Secondary | ICD-10-CM | POA: Insufficient documentation

## 2014-07-14 MED ORDER — HYDROCODONE-HOMATROPINE 5-1.5 MG/5ML PO SYRP: 5.0000 mL | ORAL_SOLUTION | Freq: Four times a day (QID) | ORAL | Status: DC | PRN

## 2014-07-14 MED ORDER — PREDNISONE 10 MG PO TABS: ORAL_TABLET | ORAL | Status: DC

## 2014-07-14 MED ORDER — IPRATROPIUM-ALBUTEROL 0.5-2.5 (3) MG/3ML IN SOLN: 3.0000 mL | Freq: Four times a day (QID) | RESPIRATORY_TRACT | Status: DC | PRN

## 2014-07-14 MED ORDER — AZITHROMYCIN 250 MG PO TABS: ORAL_TABLET | ORAL | Status: DC

## 2014-07-14 MED ORDER — METHYLPREDNISOLONE ACETATE 80 MG/ML IJ SUSP
120.0000 mg | Freq: Once | INTRAMUSCULAR | Status: AC
  Administered 2014-07-14: 120 mg via INTRAMUSCULAR

## 2014-07-14 NOTE — Progress Notes (Signed)
Pre visit review using our clinic review tool, if applicable. No additional management support is needed unless otherwise documented below in the visit note. 

## 2014-07-14 NOTE — Patient Instructions (Signed)
You had the steroid shot today  Please take all new medication as prescribed - the antibiotic, cough medicine, prednisone  Please continue all other medications as before, and refills have been done if requested - the duoneb  Please have the pharmacy call with any other refills you may need..  Please keep your appointments with your specialists as you may have planned

## 2014-07-14 NOTE — Progress Notes (Signed)
Subjective:    Patient ID: Brandi Shaffer, female    DOB: 1945-10-13, 69 y.o.   MRN: [id number]  HPI  Here with acute onset mild to mod 2-3 days ST, HA, general weakness and malaise, with prod cough greenish sputum, but Pt denies chest pain, increased sob or doe, wheezing, orthopnea, PND, increased LE swelling, palpitations, dizziness or syncope, except for onset wheezing/sob x 1 days, but ran out of duoneb she has at home.  Pt denies new neurological symptoms such as new headache, or facial or extremity weakness or numbness  Pt denies polydipsia, polyuria,   Past Medical History  Diagnosis Date  . ANEMIA-IRON DEFICIENCY 12/06/2009  . DIABETES MELLITUS, TYPE II 09/17/2006  . DIVERTICULOSIS, COLON 12/06/2009  . GENITAL HERPES 03/24/2010  . HYPERLIPIDEMIA 03/24/2010  . HYPERTENSION 09/17/2006  . HYPOTHYROIDISM 09/17/2006  . LIPOMA 12/06/2009  . PERIPHERAL EDEMA 03/24/2010  . GERD (gastroesophageal reflux disease) 06/28/2011  . Renal cyst 07/05/2011    1.9 cm minimally complex  . Asthma 12/05/2011   Past Surgical History  Procedure Laterality Date  . Tubal ligation    . Abdominal hysterectomy      fibroids  . Ankle surgury      x 3 left - chronic pain/swelling  . Tonsillectomy    . Shoulder surgery Left     reports that she has never smoked. She does not have any smokeless tobacco history on file. She reports that she does not drink alcohol or use illicit drugs. family history includes Diabetes in her other; Heart disease in her sister; Hypertension in her father and mother; Joint hypermobility in her other; Prostate cancer in her brother; Stroke in her sister. Allergies  Allergen Reactions  . Januvia [Sitagliptin Phosphate]     dizziness  . Metformin And Related Nausea Only   Current Outpatient Prescriptions on File Prior to Visit  Medication Sig Dispense Refill  . amLODipine (NORVASC) 5 MG tablet TAKE 1 TABLET EVERY DAY 90 tablet 3  . Ascorbic Acid (VITAMIN C) 500 MG tablet Take 500  mg by mouth daily.      Marland Kitchen aspirin 81 MG EC tablet TAKE 1 TABLET BY MOUTH DAILY. SWALLOW WHOLE. 30 tablet 11  . Cholecalciferol (VITAMIN D) 400 UNITS tablet Take 400 Units by mouth daily.      . Coenzyme Q10 (COQ10) 100 MG capsule Take 100 mg by mouth daily.      Marland Kitchen glucose blood test strip 1 each by Other route 2 (two) times daily. Use to check blood sugars twice a day Dx E11.9 100 each 3  . ibuprofen (ADVIL,MOTRIN) 400 MG tablet TAKE 1 TABLET (400 MG TOTAL) BY MOUTH EVERY 6 (SIX) HOURS AS NEEDED FOR PAIN. 60 tablet 0  . irbesartan-hydrochlorothiazide (AVALIDE) 300-12.5 MG per tablet TAKE 1 TABLET BY MOUTH DAILY. 90 tablet 3  . naproxen sodium (ANAPROX) 220 MG tablet Take 220 mg by mouth daily as needed.      Letta Pate DELICA LANCETS 33G MISC 1 each by Does not apply route 2 (two) times daily. Use to help check blood sugars twice a day Dx E11.9 100 each 3  . pioglitazone (ACTOS) 15 MG tablet TAKE 1 TABLET BY MOUTH EVERY DAY 90 tablet 3  . potassium chloride (KLOR-CON 10) 10 MEQ tablet Take 1 tablet (10 mEq total) by mouth daily. 90 tablet 3  . PROAIR HFA 108 (90 BASE) MCG/ACT inhaler INHALE 2 PUFFS INTO THE LUNGS EVERY 6 (SIX) HOURS AS NEEDED FOR WHEEZING. 8.5  Inhaler 5  . vitamin B-12 (CYANOCOBALAMIN) 1000 MCG tablet Take 1,000 mcg by mouth daily.       No current facility-administered medications on file prior to visit.   Review of Systems  Constitutional: Negative for unusual diaphoresis or night sweats HENT: Negative for ringing in ear or discharge Eyes: Negative for double vision or worsening visual disturbance.  Respiratory: Negative for choking and stridor.   Gastrointestinal: Negative for vomiting or other signifcant bowel change Genitourinary: Negative for hematuria or change in urine volume.  Musculoskeletal: Negative for other MSK pain or swelling Skin: Negative for color change and worsening wound.  Neurological: Negative for tremors and numbness other than noted    Psychiatric/Behavioral: Negative for decreased concentration or agitation other than above       Objective:   Physical Exam BP 126/82 mmHg  Pulse 75  Temp(Src) 97.8 F (36.6 C) (Oral)  Wt 192 lb 1.3 oz (87.127 kg)  SpO2 98% VS noted, mild ill Constitutional: Pt appears in no significant distress HENT: Head: NCAT.  Right Ear: External ear normal.  Left Ear: External ear normal.  Eyes: . Pupils are equal, round, and reactive to light. Conjunctivae and EOM are normal Bilat tm's with mild erythema.  Max sinus areas mild tender.  Pharynx with mild erythema, no exudate Neck: Normal range of motion. Neck supple.  Cardiovascular: Normal rate and regular rhythm.   Pulmonary/Chest: Effort normal and breath sounds without rales , but with decr BS bilat somewhat and bilat few wheezes Neurological: Pt is alert. Not confused , motor grossly intact Skin: Skin is warm. No rash, no LE edema Psychiatric: Pt behavior is normal. No agitation.     Assessment & Plan:

## 2014-07-18 NOTE — Assessment & Plan Note (Signed)
Mild to mod, for antibx course,  to f/u any worsening symptoms or concerns 

## 2014-07-18 NOTE — Assessment & Plan Note (Signed)
stable overall by history and exam, recent data reviewed with pt, and pt to continue medical treatment as before,  to f/u any worsening symptoms or concerns Lab Results  Component Value Date   HGBA1C 5.8 06/08/2014   To call for onset polys or cbg > 200 with illness/steroid tx

## 2014-07-18 NOTE — Assessment & Plan Note (Signed)
Mild to mod, for depomedrol IM, predpac asd, duonebs prn,  to f/u any worsening symptoms or concerns

## 2014-10-09 ENCOUNTER — Other Ambulatory Visit: Payer: Self-pay | Admitting: Internal Medicine

## 2014-10-20 ENCOUNTER — Other Ambulatory Visit: Payer: Self-pay | Admitting: Internal Medicine

## 2014-11-10 ENCOUNTER — Ambulatory Visit (INDEPENDENT_AMBULATORY_CARE_PROVIDER_SITE_OTHER): Payer: Medicare Other | Admitting: Internal Medicine

## 2014-11-10 ENCOUNTER — Encounter: Payer: Self-pay | Admitting: Internal Medicine

## 2014-11-10 VITALS — BP 136/86 | HR 80 | Temp 98.0°F | Ht 59.0 in | Wt 193.0 lb

## 2014-11-10 DIAGNOSIS — E119 Type 2 diabetes mellitus without complications: Secondary | ICD-10-CM | POA: Diagnosis not present

## 2014-11-10 DIAGNOSIS — I10 Essential (primary) hypertension: Secondary | ICD-10-CM | POA: Diagnosis not present

## 2014-11-10 DIAGNOSIS — M79604 Pain in right leg: Secondary | ICD-10-CM | POA: Diagnosis not present

## 2014-11-10 MED ORDER — ACETAMINOPHEN-CODEINE 300-30 MG PO TABS: 1.0000 | ORAL_TABLET | Freq: Four times a day (QID) | ORAL | Status: DC | PRN

## 2014-11-10 NOTE — Progress Notes (Signed)
Subjective:    Patient ID: Brandi Shaffer, female    DOB: 02-Dec-1945, 69 y.o.   MRN: [id number]  HPI  Here after recent car trip to [place] to visit family and football game, did quite a bit of walking she does not normallly do; no falls, hard to walk due to prior LLE rods to lower leg, but next day back at home in [place] noted sore/tender knot to right lateral mig leg with diffuse swelling to leg below the knee, without redness or skin change. Last fall was > 2 wks in bathtub, still has some soreness to right lower anterior costal margin, no buiseing and did not feel she needed medical attnetion.  Pt denies other chest pain, increased sob or doe, wheezing, orthopnea, PND, increased LE swelling, palpitations, dizziness or syncope.  Takes ASA 8 mg per day Past Medical History  Diagnosis Date  . ANEMIA-IRON DEFICIENCY 12/06/2009  . DIABETES MELLITUS, TYPE II 09/17/2006  . DIVERTICULOSIS, COLON 12/06/2009  . GENITAL HERPES 03/24/2010  . HYPERLIPIDEMIA 03/24/2010  . HYPERTENSION 09/17/2006  . HYPOTHYROIDISM 09/17/2006  . LIPOMA 12/06/2009  . PERIPHERAL EDEMA 03/24/2010  . GERD (gastroesophageal reflux disease) 06/28/2011  . Renal cyst 07/05/2011    1.9 cm minimally complex  . Asthma 12/05/2011   Past Surgical History  Procedure Laterality Date  . Tubal ligation    . Abdominal hysterectomy      fibroids  . Ankle surgury      x 3 left - chronic pain/swelling  . Tonsillectomy    . Shoulder surgery Left     reports that she has never smoked. She does not have any smokeless tobacco history on file. She reports that she does not drink alcohol or use illicit drugs. family history includes Diabetes in her other; Heart disease in her sister; Hypertension in her father and mother; Joint hypermobility in her other; Prostate cancer in her brother; Stroke in her sister. Allergies  Allergen Reactions  . Januvia [Sitagliptin Phosphate]     dizziness  . Metformin And Related Nausea Only   Current  Outpatient Prescriptions on File Prior to Visit  Medication Sig Dispense Refill  . amLODipine (NORVASC) 5 MG tablet TAKE 1 TABLET EVERY DAY 90 tablet 2  . Ascorbic Acid (VITAMIN C) 500 MG tablet Take 500 mg by mouth daily.      Marland Kitchen aspirin 81 MG EC tablet TAKE 1 TABLET BY MOUTH DAILY. SWALLOW WHOLE. 30 tablet 11  . Cholecalciferol (VITAMIN D) 400 UNITS tablet Take 400 Units by mouth daily.      . Coenzyme Q10 (COQ10) 100 MG capsule Take 100 mg by mouth daily.      Marland Kitchen glucose blood test strip 1 each by Other route 2 (two) times daily. Use to check blood sugars twice a day Dx E11.9 100 each 3  . ibuprofen (ADVIL,MOTRIN) 400 MG tablet TAKE 1 TABLET (400 MG TOTAL) BY MOUTH EVERY 6 (SIX) HOURS AS NEEDED FOR PAIN. 60 tablet 0  . ipratropium-albuterol (DUONEB) 0.5-2.5 (3) MG/3ML SOLN Take 3 mLs by nebulization every 6 (six) hours as needed. 360 mL 5  . irbesartan-hydrochlorothiazide (AVALIDE) 300-12.5 MG per tablet TAKE 1 TABLET BY MOUTH DAILY. 90 tablet 3  . KLOR-CON 10 10 MEQ tablet TAKE 1 TABLET BY MOUTH EVERY DAY 90 tablet 2  . naproxen sodium (ANAPROX) 220 MG tablet Take 220 mg by mouth daily as needed.      Letta Pate DELICA LANCETS 33G MISC 1 each by Does not apply  route 2 (two) times daily. Use to help check blood sugars twice a day Dx E11.9 100 each 3  . pioglitazone (ACTOS) 15 MG tablet TAKE 1 TABLET BY MOUTH EVERY DAY 90 tablet 0  . PROAIR HFA 108 (90 BASE) MCG/ACT inhaler INHALE 2 PUFFS INTO THE LUNGS EVERY 6 (SIX) HOURS AS NEEDED FOR WHEEZING. 8.5 Inhaler 5  . vitamin B-12 (CYANOCOBALAMIN) 1000 MCG tablet Take 1,000 mcg by mouth daily.      Marland Kitchen azithromycin (ZITHROMAX Z-PAK) 250 MG tablet Use as directed (Patient not taking: Reported on 11/10/2014) 6 tablet 1  . HYDROcodone-homatropine (HYCODAN) 5-1.5 MG/5ML syrup Take 5 mLs by mouth every 6 (six) hours as needed for cough. (Patient not taking: Reported on 11/10/2014) 120 mL 0  . potassium chloride (KLOR-CON 10) 10 MEQ tablet Take 1 tablet (10  mEq total) by mouth daily. 90 tablet 3  . predniSONE (DELTASONE) 10 MG tablet 3 tabs by mouth per day for 3 days,2tabs per day for 3 days,1tab per day for 3 days (Patient not taking: Reported on 11/10/2014) 18 tablet 0   No current facility-administered medications on file prior to visit.    Review of Systems  Constitutional: Negative for unusual diaphoresis or night sweats HENT: Negative for ringing in ear or discharge Eyes: Negative for double vision or worsening visual disturbance.  Respiratory: Negative for choking and stridor.   Gastrointestinal: Negative for vomiting or other signifcant bowel change Genitourinary: Negative for hematuria or change in urine volume.  Musculoskeletal: Negative for other MSK pain or swelling Skin: Negative for color change and worsening wound.  Neurological: Negative for tremors and numbness other than noted  Psychiatric/Behavioral: Negative for decreased concentration or agitation other than above       Objective:   Physical Exam BP 136/86 mmHg  Pulse 80  Temp(Src) 98 F (36.7 C) (Oral)  Ht 4\' 11"  (1.499 m)  Wt 193 lb (87.544 kg)  BMI 38.96 kg/m2  SpO2 97% VS noted,  Constitutional: Pt appears in no significant distress HENT: Head: NCAT.  Right Ear: External ear normal.  Left Ear: External ear normal.  Eyes: . Pupils are equal, round, and reactive to light. Conjunctivae and EOM are normal Neck: Normal range of motion. Neck supple.  Cardiovascular: Normal rate and regular rhythm.   Pulmonary/Chest: Effort normal and breath sounds without rales or wheezing.  Neurological: Pt is alert. Not confused , motor grossly intact Skin: Skin is warm. No rash, trace  LLE edema below the knee with diffuse mild warmth without skin change or erythema or ulcer; has 2 cm area right mid lateral leg tender/swelling/firmness subq, RLE 1+ dorsalis pedis Psychiatric: Pt behavior is normal. No agitation.     Assessment & Plan:

## 2014-11-10 NOTE — Assessment & Plan Note (Signed)
stable overall by history and exam, recent data reviewed with pt, and pt to continue medical treatment as before,  to f/u any worsening symptoms or concerns Lab Results  Component Value Date   HGBA1C 5.8 06/08/2014

## 2014-11-10 NOTE — Assessment & Plan Note (Signed)
stable overall by history and exam, recent data reviewed with pt, and pt to continue medical treatment as before,  to f/u any worsening symptoms or concerns BP Readings from Last 3 Encounters:  11/10/14 136/86  07/14/14 126/82  06/29/14 134/74

## 2014-11-10 NOTE — Assessment & Plan Note (Signed)
Exam most c/w superfic phlebitis, for incr asa 81 mg to 2 per day x 2 wks, then reduce again, tyelnol #3 prn pain, and for RLE venous doppler - r/o DVT

## 2014-11-10 NOTE — Progress Notes (Signed)
Pre visit review using our clinic review tool, if applicable. No additional management support is needed unless otherwise documented below in the visit note. 

## 2014-11-10 NOTE — Patient Instructions (Signed)
Ok to increase the aspirin 81 mg to 2 per day for 2wks, then reduce back to 1 per day after that  Please take all new medication as prescribed - the pain medication  You will be contacted regarding the referral for: Right lower extremity venous doppler - to rule out DVT  Please continue all other medications as before, and refills have been done if requested.  Please have the pharmacy call with any other refills you may need.  Please keep your appointments with your specialists as you may have planned

## 2014-11-11 ENCOUNTER — Ambulatory Visit (HOSPITAL_COMMUNITY)
Admission: RE | Admit: 2014-11-11 | Discharge: 2014-11-11 | Disposition: A | Discharge status: Home / Self Care | Payer: Medicare Other | Source: Ambulatory Visit | Attending: Internal Medicine | Admitting: Internal Medicine

## 2014-11-11 DIAGNOSIS — E785 Hyperlipidemia, unspecified: Secondary | ICD-10-CM | POA: Insufficient documentation

## 2014-11-11 DIAGNOSIS — M79604 Pain in right leg: Secondary | ICD-10-CM | POA: Diagnosis not present

## 2014-11-11 DIAGNOSIS — E119 Type 2 diabetes mellitus without complications: Secondary | ICD-10-CM | POA: Diagnosis not present

## 2014-11-11 DIAGNOSIS — I10 Essential (primary) hypertension: Secondary | ICD-10-CM | POA: Insufficient documentation

## 2014-11-12 ENCOUNTER — Encounter: Payer: Self-pay | Admitting: Internal Medicine

## 2014-11-15 ENCOUNTER — Ambulatory Visit (INDEPENDENT_AMBULATORY_CARE_PROVIDER_SITE_OTHER): Payer: Medicare Other | Admitting: Family

## 2014-11-15 ENCOUNTER — Telehealth: Payer: Self-pay | Admitting: Internal Medicine

## 2014-11-15 ENCOUNTER — Encounter: Payer: Self-pay | Admitting: Family

## 2014-11-15 VITALS — BP 124/74 | HR 92 | Temp 98.3°F | Resp 18 | Ht 59.0 in | Wt 195.0 lb

## 2014-11-15 DIAGNOSIS — M79604 Pain in right leg: Secondary | ICD-10-CM

## 2014-11-15 MED ORDER — DOXYCYCLINE HYCLATE 100 MG PO TABS: 100.0000 mg | ORAL_TABLET | Freq: Two times a day (BID) | ORAL | Status: DC

## 2014-11-15 NOTE — Telephone Encounter (Signed)
Pt called regarding the know on her leg. It now has a red ring around it and is wondering if it may be a spider bite and if an antibiotic should be called in. I did send her over to the [facility] nurse line for this.  She also wants to know about the results of the test she did last week

## 2014-11-15 NOTE — Progress Notes (Signed)
Pre visit review using our clinic review tool, if applicable. No additional management support is needed unless otherwise documented below in the visit note. 

## 2014-11-15 NOTE — Progress Notes (Signed)
Subjective:    Patient ID: Brandi Shaffer, female    DOB: 1945-08-29, 69 y.o.   MRN: [id number]  Chief Complaint  Patient presents with  . Leg Swelling    leg swelling in both legs that has been going for a couple of weeks, now has a place on her right leg that she says may be a bug bite but it itches really bad and both legs are getting tight    HPI:  Brandi Shaffer is a 69 y.o. female who  has a past medical history of ANEMIA-IRON DEFICIENCY (12/06/2009); DIABETES MELLITUS, TYPE II (09/17/2006); DIVERTICULOSIS, COLON (12/06/2009); GENITAL HERPES (03/24/2010); HYPERLIPIDEMIA (03/24/2010); HYPERTENSION (09/17/2006); HYPOTHYROIDISM (09/17/2006); LIPOMA (12/06/2009); PERIPHERAL EDEMA (03/24/2010); GERD (gastroesophageal reflux disease) (06/28/2011); Renal cyst (07/05/2011); and Asthma (12/05/2011). and presents today for an acute office visit.   Associated symptoms of lower extremity edema in both legs has been going on for about 2 weeks. Was seen in the office about 5 days ago for right leg pain and noted to have potential phelbitis and was treated with aspirin and sent for a lower extremity doppler which was negative for a blood clot. Modifying factors include ice and and rubbing it with grain alcohol. Describes how her leg is itchy and swelling. Feels tight when she raises her leg.   Allergies  Allergen Reactions  . Januvia [Sitagliptin Phosphate]     dizziness  . Metformin And Related Nausea Only     Current Outpatient Prescriptions on File Prior to Visit  Medication Sig Dispense Refill  . Acetaminophen-Codeine (TYLENOL/CODEINE #3) 300-30 MG per tablet Take 1 tablet by mouth every 6 (six) hours as needed for pain. 50 tablet 0  . amLODipine (NORVASC) 5 MG tablet TAKE 1 TABLET EVERY DAY 90 tablet 2  . Ascorbic Acid (VITAMIN C) 500 MG tablet Take 500 mg by mouth daily.      Marland Kitchen aspirin 81 MG EC tablet TAKE 1 TABLET BY MOUTH DAILY. SWALLOW WHOLE. 30 tablet 11  . azithromycin (ZITHROMAX Z-PAK) 250  MG tablet Use as directed 6 tablet 1  . Cholecalciferol (VITAMIN D) 400 UNITS tablet Take 400 Units by mouth daily.      . Coenzyme Q10 (COQ10) 100 MG capsule Take 100 mg by mouth daily.      Marland Kitchen glucose blood test strip 1 each by Other route 2 (two) times daily. Use to check blood sugars twice a day Dx E11.9 100 each 3  . HYDROcodone-homatropine (HYCODAN) 5-1.5 MG/5ML syrup Take 5 mLs by mouth every 6 (six) hours as needed for cough. 120 mL 0  . ibuprofen (ADVIL,MOTRIN) 400 MG tablet TAKE 1 TABLET (400 MG TOTAL) BY MOUTH EVERY 6 (SIX) HOURS AS NEEDED FOR PAIN. 60 tablet 0  . ipratropium-albuterol (DUONEB) 0.5-2.5 (3) MG/3ML SOLN Take 3 mLs by nebulization every 6 (six) hours as needed. 360 mL 5  . irbesartan-hydrochlorothiazide (AVALIDE) 300-12.5 MG per tablet TAKE 1 TABLET BY MOUTH DAILY. 90 tablet 3  . KLOR-CON 10 10 MEQ tablet TAKE 1 TABLET BY MOUTH EVERY DAY 90 tablet 2  . naproxen sodium (ANAPROX) 220 MG tablet Take 220 mg by mouth daily as needed.      Letta Pate DELICA LANCETS 33G MISC 1 each by Does not apply route 2 (two) times daily. Use to help check blood sugars twice a day Dx E11.9 100 each 3  . pioglitazone (ACTOS) 15 MG tablet TAKE 1 TABLET BY MOUTH EVERY DAY 90 tablet 0  . predniSONE (DELTASONE) 10  MG tablet 3 tabs by mouth per day for 3 days,2tabs per day for 3 days,1tab per day for 3 days 18 tablet 0  . PROAIR HFA 108 (90 BASE) MCG/ACT inhaler INHALE 2 PUFFS INTO THE LUNGS EVERY 6 (SIX) HOURS AS NEEDED FOR WHEEZING. 8.5 Inhaler 5  . vitamin B-12 (CYANOCOBALAMIN) 1000 MCG tablet Take 1,000 mcg by mouth daily.      . potassium chloride (KLOR-CON 10) 10 MEQ tablet Take 1 tablet (10 mEq total) by mouth daily. 90 tablet 3   No current facility-administered medications on file prior to visit.     Past Surgical History  Procedure Laterality Date  . Tubal ligation    . Abdominal hysterectomy      fibroids  . Ankle surgury      x 3 left - chronic pain/swelling  . Tonsillectomy     . Shoulder surgery Left     Review of Systems  Constitutional: Negative for fever and chills.  Respiratory: Negative for chest tightness, shortness of breath and wheezing.   Cardiovascular: Negative for chest pain, palpitations and leg swelling.      Objective:    BP 124/74 mmHg  Pulse 92  Temp(Src) 98.3 F (36.8 C) (Oral)  Resp 18  Ht 4\' 11"  (1.499 m)  Wt 195 lb (88.451 kg)  BMI 39.36 kg/m2  SpO2 98% Nursing note and vital signs reviewed.  Physical Exam  Constitutional: She is oriented to person, place, and time. She appears well-developed and well-nourished. No distress.  Cardiovascular: Normal rate, regular rhythm, normal heart sounds and intact distal pulses.   Pulmonary/Chest: Effort normal and breath sounds normal.  Musculoskeletal:  Right lower extremity with increased edema compared to the contralateral side with no deformity or discoloration. Mild/moderate tenderness of lower extremity with increased temperature. Proximal and distal joints with full range of motion. Distal pulses are intact and appropriate.   Neurological: She is alert and oriented to person, place, and time.  Skin: Skin is warm and dry.  Psychiatric: She has a normal mood and affect. Her behavior is normal. Judgment and thought content normal.       Assessment & Plan:   Problem List Items Addressed This Visit      Other   Right leg pain - Primary    Previous lower extremity doppler rules out DVT. Concern for potential infection. Treat empirically with doxycycline. Continue conservative treatment with leg elevation. Follow up if symptoms worsen or fail to improve.       Relevant Medications   doxycycline (VIBRA-TABS) 100 MG tablet

## 2014-11-15 NOTE — Assessment & Plan Note (Signed)
Previous lower extremity doppler rules out DVT. Concern for potential infection. Treat empirically with doxycycline. Continue conservative treatment with leg elevation. Follow up if symptoms worsen or fail to improve.

## 2014-11-15 NOTE — Telephone Encounter (Signed)
Pt called team health and has an appt for today with [name].

## 2014-11-15 NOTE — Patient Instructions (Signed)
Thank you for choosing [facility].  Summary/Instructions:  Your prescription(s) have been submitted to your pharmacy or been printed and provided for you. Please take as directed and contact our office if you believe you are having problem(s) with the medication(s) or have any questions.  If your symptoms worsen or fail to improve, please contact our office for further instruction, or in case of emergency go directly to the emergency room at the closest medical facility.   Please keep your leg elevated and start the medication.  Ice as needed. Cold compresses to help with the itching.

## 2014-11-15 NOTE — Telephone Encounter (Signed)
Patient Name: Brandi Shaffer DOB: [birth year] Initial Comment Caller states legs been swelling, she had an ultra sound, there's a spot on her leg that's a circle with a hole in the middle Nurse Assessment Nurse: Marcelline Deist, RN, Kermit Balo Date/Time (Eastern Time): 11/15/2014 9:02:39 AM Confirm and document reason for call. If symptomatic, describe symptoms. ---Caller states legs have been swelling, she had an ultra sound done on the 15th to check for blood clots. There's a spot on her right lower leg that's a circle with a hole in the middle that itches on the inside. Was given a pain rx. Has the patient traveled out of the country within the last 30 days? ---Not Applicable Does the patient require triage? ---Yes Related visit to physician within the last 2 weeks? ---Yes Does the PT have any chronic conditions? (i.e. diabetes, asthma, etc.) ---Yes List chronic conditions. ---diabetes, hypertension Guidelines Guideline Title Affirmed Question Affirmed Notes Leg Swelling and Edema [1] Red area or streak [2] large (> 2 in. or 5 cm) Final Disposition User See Physician within 4 Hours (or PCP triage) Marcelline Deist, RN, Lynda Referrals REFERRED TO PCP OFFICE Disagree/Comply: Comply

## 2014-11-23 ENCOUNTER — Telehealth: Payer: Self-pay | Admitting: Internal Medicine

## 2014-11-23 NOTE — Telephone Encounter (Signed)
Patient states she is stilling having a throbbing pain in leg.  She has already seen Dr. Jenny Reichmann and Terri Piedra for this issue.  She is also having headaches.  She does not know if it is coming from the blood pressure medicine or the medicine for her leg.  Does she need to be referred or does she need to make an appointment to come in?

## 2014-11-23 NOTE — Telephone Encounter (Signed)
I have already scheduled patient for tomorrow afternoon.   We can go from there on referral.  Thanks.

## 2014-11-24 ENCOUNTER — Encounter: Payer: Self-pay | Admitting: Internal Medicine

## 2014-11-24 ENCOUNTER — Ambulatory Visit (INDEPENDENT_AMBULATORY_CARE_PROVIDER_SITE_OTHER): Payer: Medicare Other | Admitting: Internal Medicine

## 2014-11-24 VITALS — BP 142/88 | HR 95 | Temp 97.8°F | Ht 59.0 in | Wt 194.0 lb

## 2014-11-24 DIAGNOSIS — M79604 Pain in right leg: Secondary | ICD-10-CM

## 2014-11-24 DIAGNOSIS — E119 Type 2 diabetes mellitus without complications: Secondary | ICD-10-CM | POA: Diagnosis not present

## 2014-11-24 DIAGNOSIS — I10 Essential (primary) hypertension: Secondary | ICD-10-CM

## 2014-11-24 NOTE — Patient Instructions (Signed)
Please continue all other medications as before, and refills have been done if requested.  Please have the pharmacy call with any other refills you may need.  Please keep your appointments with your specialists as you may have planned  You will be contacted regarding the referral for: MRI for the right leg (you will likely hear about this tomorrow)  You will be contacted regarding the referral for: Dr Smith/sports medicine

## 2014-11-24 NOTE — Progress Notes (Signed)
Pre visit review using our clinic review tool, if applicable. No additional management support is needed unless otherwise documented below in the visit note. 

## 2014-11-24 NOTE — Progress Notes (Signed)
Subjective:    Patient ID: Brandi Shaffer, female    DOB: 1945-03-09, 69 y.o.   MRN: [id number]  HPI  Here to f/u with regards to RLE predicament which persists and is quite distressing and uncomfortable to her; has been seen twice for RLE pain/swelling/warmth in last 2 wks only between knee and ankle and not involving the joints.  RLE venous dopplers neg.  No better with asa 2 qd  And took but no better with partial doxy course, only 6 days due to onset rash to arms with itch and welts that came and went.  Mostly itchy , tight, uncomfortable, and ok with tylenol with codeine prn and doesn't have to take very often.  Much stress getting here today, she is not impressed with her elev BP and declines to address this today. Pt denies chest pain, increased sob or doe, wheezing, orthopnea, PND, increased LE swelling, palpitations, dizziness or syncope.  Pt denies new neurological symptoms such as new headache, or facial or extremity weakness or numbness   Pt denies polydipsia, polyuria Past Medical History  Diagnosis Date  . ANEMIA-IRON DEFICIENCY 12/06/2009  . DIABETES MELLITUS, TYPE II 09/17/2006  . DIVERTICULOSIS, COLON 12/06/2009  . GENITAL HERPES 03/24/2010  . HYPERLIPIDEMIA 03/24/2010  . HYPERTENSION 09/17/2006  . HYPOTHYROIDISM 09/17/2006  . LIPOMA 12/06/2009  . PERIPHERAL EDEMA 03/24/2010  . GERD (gastroesophageal reflux disease) 06/28/2011  . Renal cyst 07/05/2011    1.9 cm minimally complex  . Asthma 12/05/2011   Past Surgical History  Procedure Laterality Date  . Tubal ligation    . Abdominal hysterectomy      fibroids  . Ankle surgury      x 3 left - chronic pain/swelling  . Tonsillectomy    . Shoulder surgery Left     reports that she has never smoked. She does not have any smokeless tobacco history on file. She reports that she does not drink alcohol or use illicit drugs. family history includes Diabetes in her other; Heart disease in her sister; Hypertension in her father and  mother; Joint hypermobility in her other; Prostate cancer in her brother; Stroke in her sister. Allergies  Allergen Reactions  . Januvia [Sitagliptin Phosphate]     dizziness  . Metformin And Related Nausea Only  . Doxycycline Rash   Current Outpatient Prescriptions on File Prior to Visit  Medication Sig Dispense Refill  . Acetaminophen-Codeine (TYLENOL/CODEINE #3) 300-30 MG per tablet Take 1 tablet by mouth every 6 (six) hours as needed for pain. 50 tablet 0  . amLODipine (NORVASC) 5 MG tablet TAKE 1 TABLET EVERY DAY 90 tablet 2  . Ascorbic Acid (VITAMIN C) 500 MG tablet Take 500 mg by mouth daily.      Marland Kitchen aspirin 81 MG EC tablet TAKE 1 TABLET BY MOUTH DAILY. SWALLOW WHOLE. 30 tablet 11  . Cholecalciferol (VITAMIN D) 400 UNITS tablet Take 400 Units by mouth daily.      . Coenzyme Q10 (COQ10) 100 MG capsule Take 100 mg by mouth daily.      Marland Kitchen glucose blood test strip 1 each by Other route 2 (two) times daily. Use to check blood sugars twice a day Dx E11.9 100 each 3  . ibuprofen (ADVIL,MOTRIN) 400 MG tablet TAKE 1 TABLET (400 MG TOTAL) BY MOUTH EVERY 6 (SIX) HOURS AS NEEDED FOR PAIN. 60 tablet 0  . ipratropium-albuterol (DUONEB) 0.5-2.5 (3) MG/3ML SOLN Take 3 mLs by nebulization every 6 (six) hours as needed. 360 mL 5  .  irbesartan-hydrochlorothiazide (AVALIDE) 300-12.5 MG per tablet TAKE 1 TABLET BY MOUTH DAILY. 90 tablet 3  . KLOR-CON 10 10 MEQ tablet TAKE 1 TABLET BY MOUTH EVERY DAY 90 tablet 2  . naproxen sodium (ANAPROX) 220 MG tablet Take 220 mg by mouth daily as needed.      Letta Pate DELICA LANCETS 33G MISC 1 each by Does not apply route 2 (two) times daily. Use to help check blood sugars twice a day Dx E11.9 100 each 3  . pioglitazone (ACTOS) 15 MG tablet TAKE 1 TABLET BY MOUTH EVERY DAY 90 tablet 0  . predniSONE (DELTASONE) 10 MG tablet 3 tabs by mouth per day for 3 days,2tabs per day for 3 days,1tab per day for 3 days 18 tablet 0  . PROAIR HFA 108 (90 BASE) MCG/ACT inhaler INHALE  2 PUFFS INTO THE LUNGS EVERY 6 (SIX) HOURS AS NEEDED FOR WHEEZING. 8.5 Inhaler 5  . vitamin B-12 (CYANOCOBALAMIN) 1000 MCG tablet Take 1,000 mcg by mouth daily.      . potassium chloride (KLOR-CON 10) 10 MEQ tablet Take 1 tablet (10 mEq total) by mouth daily. 90 tablet 3   No current facility-administered medications on file prior to visit.    Review of Systems  Constitutional: Negative for unusual diaphoresis or night sweats HENT: Negative for ringing in ear or discharge Eyes: Negative for double vision or worsening visual disturbance.  Respiratory: Negative for choking and stridor.   Gastrointestinal: Negative for vomiting or other signifcant bowel change Genitourinary: Negative for hematuria or change in urine volume.  Musculoskeletal: Negative for other MSK pain or swelling Skin: Negative for color change and worsening wound.  Neurological: Negative for tremors and numbness other than noted  Psychiatric/Behavioral: Negative for decreased concentration or agitation other than above  '    Objective:   Physical Exam BP 164/100 mmHg  Pulse 95  Temp(Src) 97.8 F (36.6 C) (Oral)  Ht 4\' 11"  (1.499 m)  Wt 194 lb (87.998 kg)  BMI 39.16 kg/m2  SpO2 97% VS noted,  Constitutional: Pt appears in no significant distress HENT: Head: NCAT.  Right Ear: External ear normal.  Left Ear: External ear normal.  Eyes: . Pupils are equal, round, and reactive to light. Conjunctivae and EOM are normal Neck: Normal range of motion. Neck supple.  Cardiovascular: Normal rate and regular rhythm.   Pulmonary/Chest: Effort normal and breath sounds without rales or wheezing.  Right knee and ankle with FROM, no effusion, tender, swelling or warmth Right calf with 2+ swelling/ with tender area/warmth to lateral distal leg approx 2 cm above the ankle; RLE o/w neurovasc intact - dorsalis pedis 1+ bilat; no significant changes except for darkening of skin likely stasis related Neurological: Pt is alert. Not  confused , motor grossly intact Skin: Skin is warm. No rash, no LE edema Psychiatric: Pt behavior is normal. No agitation.     Assessment & Plan:

## 2014-11-25 NOTE — Assessment & Plan Note (Signed)
Pt with persistent symptoms despite neg venous dopplers for DVT and at least partial doxy course; remains afeb and o/w stable but with persistent swelling/leg enlargement, no evidence for compartment syndrome, but will check MRI right tib/fib r/o other such as sarcoma.  Cont pain med prn.  If neg, would refer to sport med for ? Tendonitis.

## 2014-11-25 NOTE — Assessment & Plan Note (Signed)
stable overall by history and exam, recent data reviewed with pt, and pt to continue medical treatment as before,  to f/u any worsening symptoms or concerns Lab Results  Component Value Date   HGBA1C 5.8 06/08/2014

## 2014-11-25 NOTE — Assessment & Plan Note (Signed)
stable overall by history and exam, recent data reviewed with pt, and pt to continue medical treatment as before,  to f/u any worsening symptoms or concerns BP Readings from Last 3 Encounters:  11/24/14 142/88  11/15/14 124/74  11/10/14 136/86

## 2014-12-08 ENCOUNTER — Ambulatory Visit
Admission: RE | Admit: 2014-12-08 | Discharge: 2014-12-08 | Disposition: A | Discharge status: Home / Self Care | Payer: Medicare Other | Source: Ambulatory Visit | Attending: Internal Medicine | Admitting: Internal Medicine

## 2014-12-08 DIAGNOSIS — R600 Localized edema: Secondary | ICD-10-CM | POA: Diagnosis not present

## 2014-12-08 DIAGNOSIS — M79604 Pain in right leg: Secondary | ICD-10-CM

## 2014-12-13 ENCOUNTER — Ambulatory Visit (INDEPENDENT_AMBULATORY_CARE_PROVIDER_SITE_OTHER): Payer: Medicare Other

## 2014-12-13 ENCOUNTER — Encounter: Payer: Self-pay | Admitting: Family Medicine

## 2014-12-13 ENCOUNTER — Telehealth: Payer: Self-pay

## 2014-12-13 ENCOUNTER — Ambulatory Visit (HOSPITAL_COMMUNITY)
Admission: RE | Admit: 2014-12-13 | Discharge: 2014-12-13 | Disposition: A | Discharge status: Home / Self Care | Payer: Medicare Other | Source: Ambulatory Visit | Attending: Cardiology | Admitting: Cardiology

## 2014-12-13 ENCOUNTER — Ambulatory Visit (INDEPENDENT_AMBULATORY_CARE_PROVIDER_SITE_OTHER): Payer: Medicare Other | Admitting: Family Medicine

## 2014-12-13 ENCOUNTER — Other Ambulatory Visit (INDEPENDENT_AMBULATORY_CARE_PROVIDER_SITE_OTHER): Payer: Medicare Other

## 2014-12-13 VITALS — BP 138/82 | HR 93 | Ht 59.0 in | Wt 194.0 lb

## 2014-12-13 DIAGNOSIS — M79604 Pain in right leg: Secondary | ICD-10-CM

## 2014-12-13 DIAGNOSIS — M7989 Other specified soft tissue disorders: Secondary | ICD-10-CM | POA: Insufficient documentation

## 2014-12-13 DIAGNOSIS — L03115 Cellulitis of right lower limb: Secondary | ICD-10-CM

## 2014-12-13 DIAGNOSIS — I10 Essential (primary) hypertension: Secondary | ICD-10-CM | POA: Insufficient documentation

## 2014-12-13 DIAGNOSIS — L03116 Cellulitis of left lower limb: Secondary | ICD-10-CM | POA: Insufficient documentation

## 2014-12-13 DIAGNOSIS — E785 Hyperlipidemia, unspecified: Secondary | ICD-10-CM | POA: Insufficient documentation

## 2014-12-13 DIAGNOSIS — E119 Type 2 diabetes mellitus without complications: Secondary | ICD-10-CM | POA: Insufficient documentation

## 2014-12-13 LAB — CBC WITH DIFFERENTIAL/PLATELET
Basophils Absolute: 0 10*3/uL (ref 0.0–0.1)
Basophils Relative: 0.5 % (ref 0.0–3.0)
Eosinophils Absolute: 0.1 10*3/uL (ref 0.0–0.7)
Eosinophils Relative: 2.4 % (ref 0.0–5.0)
HCT: 38.3 % (ref 36.0–46.0)
Hemoglobin: 12.8 g/dL (ref 12.0–15.0)
Lymphocytes Relative: 41.3 % (ref 12.0–46.0)
Lymphs Abs: 2.3 10*3/uL (ref 0.7–4.0)
MCHC: 33.3 g/dL (ref 30.0–36.0)
MCV: 91.7 fl (ref 78.0–100.0)
Monocytes Absolute: 0.6 10*3/uL (ref 0.1–1.0)
Monocytes Relative: 9.7 % (ref 3.0–12.0)
Neutro Abs: 2.6 10*3/uL (ref 1.4–7.7)
Neutrophils Relative %: 46.1 % (ref 43.0–77.0)
Platelets: 256 10*3/uL (ref 150.0–400.0)
RBC: 4.18 Mil/uL (ref 3.87–5.11)
RDW: 13.4 % (ref 11.5–15.5)
WBC: 5.7 10*3/uL (ref 4.0–10.5)

## 2014-12-13 LAB — C-REACTIVE PROTEIN: CRP: 0.8 mg/dL (ref 0.5–20.0)

## 2014-12-13 LAB — SEDIMENTATION RATE: Sed Rate: 35 mm/hr — ABNORMAL HIGH (ref 0–22)

## 2014-12-13 MED ORDER — CIPROFLOXACIN HCL 500 MG PO TABS: 500.0000 mg | ORAL_TABLET | Freq: Two times a day (BID) | ORAL | Status: DC

## 2014-12-13 MED ORDER — AMOXICILLIN-POT CLAVULANATE 875-125 MG PO TABS: 1.0000 | ORAL_TABLET | Freq: Two times a day (BID) | ORAL | Status: DC

## 2014-12-13 MED ORDER — FUROSEMIDE 20 MG PO TABS: 20.0000 mg | ORAL_TABLET | Freq: Every day | ORAL | Status: DC

## 2014-12-13 NOTE — Patient Instructions (Addendum)
Good to see you We will get another test to check your blood supply 2 antibiotics (cipro and augmentin) to take 2 times a day for 10 days Lasix 20mg  daily for the swelling.  Compression sleeve (tommy copper) We will get labs today as well.  If fever or chills or worsening pain seek medical attention immediatley  See me again in 7-10 days.

## 2014-12-13 NOTE — Progress Notes (Signed)
Pre visit review using our clinic review tool, if applicable. No additional management support is needed unless otherwise documented below in the visit note. 

## 2014-12-13 NOTE — Telephone Encounter (Signed)
Spoke with patient regarding lab results. 

## 2014-12-13 NOTE — Assessment & Plan Note (Signed)
I do believe the patient does have more of a cellulitis of the right lower tibia. We discussed that compression may be helpful. I would like to treat for possible cellulitis secondary to her other comorbidities. Patient will be put on Augmentin as well as ciprofloxacin to cover for gram-positive and gram-negative. This will also cover for pseudomonas. We discussed that if any worsening symptoms to seek medical attention immediately. I do think that some of patient's underlying problem is also secondary to the dependent edema. Patient will have a ABI to rule out any other vascular Comperm Bland Span could be contributing. Patient and will come back and see me again in  7-10 days to make sure that she is responding to the treatment. Labs ordered today to to rule out any other inflammatory markers.

## 2014-12-13 NOTE — Progress Notes (Signed)
[name] [address] [place], [place] [address] Phone: [phone] Subjective:    I'm seeing this patient by the request  Of: Cathlean Cower, MD     CC:  Right large knee pain and swelling Brandi Shaffer is a 69 y.o. female coming in with complaint of  Right lower knee pain and swelling. Patient does have a past medical history significant for diabetes. Had this pain for quite some time and supplementary provider. Patient did have ultrasound to rule out deep venous thrombosis which was negative. Patient did have MRI showing signs and symptoms of potential cellulitis as well as dependent edema.   patient states that she has had this for quite some time now. Had swelling immediately with some pain. Thinks he was bitten by something on the lateral aspect of her calf. Patient was to take doxycycline for presumed cellulitis that was noted after the MRI but was unable to tolerate it. Is on no antibiotics at this moment. Has had some mild trouble with her blood sugars. States that some of the swelling does seem to get better at night and then gets worse. Is able to ambulate. States that it seems to be getting worse slowly over the course of time. Denies any fevers, chills, or any abnormal weight loss.   reviewed patient's MRI which seems to be consistent with a possible cellulitis as well as some dependent edema.  Past Medical History  Diagnosis Date  . ANEMIA-IRON DEFICIENCY 12/06/2009  . DIABETES MELLITUS, TYPE II 09/17/2006  . DIVERTICULOSIS, COLON 12/06/2009  . GENITAL HERPES 03/24/2010  . HYPERLIPIDEMIA 03/24/2010  . HYPERTENSION 09/17/2006  . HYPOTHYROIDISM 09/17/2006  . LIPOMA 12/06/2009  . PERIPHERAL EDEMA 03/24/2010  . GERD (gastroesophageal reflux disease) 06/28/2011  . Renal cyst 07/05/2011    1.9 cm minimally complex  . Asthma 12/05/2011   Past Surgical History  Procedure Laterality Date  . Tubal ligation    . Abdominal hysterectomy     fibroids  . Ankle surgury      x 3 left - chronic pain/swelling  . Tonsillectomy    . Shoulder surgery Left    Social History  Substance Use Topics  . Smoking status: Never Smoker   . Smokeless tobacco: None  . Alcohol Use: No   Allergies  Allergen Reactions  . Januvia [Sitagliptin Phosphate]     dizziness  . Metformin And Related Nausea Only  . Doxycycline Rash   Family History  Problem Relation Age of Onset  . Hypertension Mother   . Hypertension Father   . Heart disease Sister     sudden death early 23's  . Stroke Sister   . Prostate cancer Brother     cancer  . Diabetes Other   . Joint hypermobility Other     DJD        Past medical history, social, surgical and family history all reviewed in electronic medical record.   Review of Systems: No headache, visual changes, nausea, vomiting, diarrhea, constipation, dizziness, abdominal pain, skin rash, fevers, chills, night sweats, weight loss, swollen lymph nodes, body aches, joint swelling, muscle aches, chest pain, shortness of breath, mood changes.   Objective Blood pressure 138/82, pulse 93, height 4\' 11"  (1.499 m), weight 194 lb (87.998 kg), SpO2 97 %.  General: No apparent distress alert and oriented x3 mood and affect normal, dressed appropriately.  HEENT: Pupils equal, extraocular movements intact  Respiratory: Patient's speak in full sentences and does not appear  short of breath  Cardiovascular: No lower extremity edema, non tender, no erythema  Skin: Warm dry intact with no signs of infection or rash on extremities or on axial skeleton.  Abdomen: Soft nontender  Neuro: Cranial nerves II through XII are intact, neurovascularly intact in all extremities with 2+ DTRs and 2+ pulses.  Lymph: No lymphadenopathy of posterior or anterior cervical chain or axillae bilaterally.  Gait  Antalgic gait MSK:  Non tender with full range of motion and good stability and symmetric strength and tone of shoulders, elbows,  wrist, hip, knee and ankles bilaterally.   right lower extremity shows the patient does have hemosiderin deposits of the distal aspect of the ankle to the distal aspect of the calf. +2 pitting edema noted. Patient has significant swelling of the right calf compared to the contralateral side with decreasing amount of hair compared to the contralateral side. Neurovascular intact distally with what appears to be a good capillary refill of the toes. Neurovascularly intact distally stated again with good deep tendon reflexes. Patient is moderately tender to palpation over the lateral gastrocnemius head. Minorly warm to palpation. Significant increase in size compared to the contralateral size.   Limited ultrasound of the lower  extremityhe was performed by Hulan Saas, M   Limited ultrasound shows the patient does have significant soft tissue swelling gait is consistent with a possible cellulitis. No significant muscle component or myositis noted. No bone involvement. Increasing Doppler flow within the soft tissue is noted. No significant pocket of fluid noted to allow for aspiration.   Impression: cellulitis with overlying dependent edema.    Impression and Recommendations:     This case required medical decision making of moderate complexity.

## 2014-12-14 ENCOUNTER — Ambulatory Visit: Payer: Medicare Other | Admitting: Internal Medicine

## 2014-12-15 ENCOUNTER — Other Ambulatory Visit (INDEPENDENT_AMBULATORY_CARE_PROVIDER_SITE_OTHER): Payer: Medicare Other

## 2014-12-15 ENCOUNTER — Other Ambulatory Visit: Payer: Self-pay

## 2014-12-15 ENCOUNTER — Ambulatory Visit (INDEPENDENT_AMBULATORY_CARE_PROVIDER_SITE_OTHER): Payer: Medicare Other | Admitting: Internal Medicine

## 2014-12-15 ENCOUNTER — Telehealth: Payer: Self-pay

## 2014-12-15 ENCOUNTER — Encounter: Payer: Self-pay | Admitting: Internal Medicine

## 2014-12-15 VITALS — BP 124/84 | HR 92 | Ht 59.0 in | Wt 192.0 lb

## 2014-12-15 DIAGNOSIS — Z23 Encounter for immunization: Secondary | ICD-10-CM | POA: Diagnosis not present

## 2014-12-15 DIAGNOSIS — I10 Essential (primary) hypertension: Secondary | ICD-10-CM | POA: Diagnosis not present

## 2014-12-15 DIAGNOSIS — E039 Hypothyroidism, unspecified: Secondary | ICD-10-CM

## 2014-12-15 DIAGNOSIS — Z0000 Encounter for general adult medical examination without abnormal findings: Secondary | ICD-10-CM | POA: Diagnosis not present

## 2014-12-15 DIAGNOSIS — E119 Type 2 diabetes mellitus without complications: Secondary | ICD-10-CM

## 2014-12-15 DIAGNOSIS — M79661 Pain in right lower leg: Secondary | ICD-10-CM

## 2014-12-15 DIAGNOSIS — E785 Hyperlipidemia, unspecified: Secondary | ICD-10-CM | POA: Diagnosis not present

## 2014-12-15 LAB — CBC WITH DIFFERENTIAL/PLATELET
Basophils Absolute: 0 10*3/uL (ref 0.0–0.1)
Basophils Relative: 0.7 % (ref 0.0–3.0)
Eosinophils Absolute: 0.2 10*3/uL (ref 0.0–0.7)
Eosinophils Relative: 3.3 % (ref 0.0–5.0)
HCT: 38.7 % (ref 36.0–46.0)
Hemoglobin: 12.9 g/dL (ref 12.0–15.0)
Lymphocytes Relative: 37.6 % (ref 12.0–46.0)
Lymphs Abs: 2.1 10*3/uL (ref 0.7–4.0)
MCHC: 33.5 g/dL (ref 30.0–36.0)
MCV: 90.8 fl (ref 78.0–100.0)
Monocytes Absolute: 0.6 10*3/uL (ref 0.1–1.0)
Monocytes Relative: 10.7 % (ref 3.0–12.0)
Neutro Abs: 2.7 10*3/uL (ref 1.4–7.7)
Neutrophils Relative %: 47.7 % (ref 43.0–77.0)
Platelets: 253 10*3/uL (ref 150.0–400.0)
RBC: 4.26 Mil/uL (ref 3.87–5.11)
RDW: 13.4 % (ref 11.5–15.5)
WBC: 5.7 10*3/uL (ref 4.0–10.5)

## 2014-12-15 LAB — URINALYSIS, ROUTINE W REFLEX MICROSCOPIC
Bilirubin Urine: NEGATIVE
Hgb urine dipstick: NEGATIVE
Ketones, ur: NEGATIVE
Nitrite: NEGATIVE
Specific Gravity, Urine: 1.01 (ref 1.000–1.030)
Total Protein, Urine: NEGATIVE
Urine Glucose: NEGATIVE
Urobilinogen, UA: 0.2 (ref 0.0–1.0)
pH: 5.5 (ref 5.0–8.0)

## 2014-12-15 LAB — BASIC METABOLIC PANEL
BUN: 22 mg/dL (ref 6–23)
CO2: 30 mEq/L (ref 19–32)
Calcium: 9.1 mg/dL (ref 8.4–10.5)
Chloride: 103 mEq/L (ref 96–112)
Creatinine, Ser: 1.05 mg/dL (ref 0.40–1.20)
GFR: 66.63 mL/min (ref >60.00)
Glucose, Bld: 91 mg/dL (ref 70–99)
Potassium: 3.8 mEq/L (ref 3.5–5.1)
Sodium: 141 mEq/L (ref 135–145)

## 2014-12-15 LAB — TSH: TSH: 1.73 u[IU]/mL (ref 0.35–4.50)

## 2014-12-15 LAB — HEPATIC FUNCTION PANEL
ALT: 12 U/L (ref 0–35)
AST: 17 U/L (ref 0–37)
Albumin: 3.9 g/dL (ref 3.5–5.2)
Alkaline Phosphatase: 114 U/L (ref 39–117)
Bilirubin, Direct: 0 mg/dL (ref 0.0–0.3)
Total Bilirubin: 0.5 mg/dL (ref 0.2–1.2)
Total Protein: 6.8 g/dL (ref 6.0–8.3)

## 2014-12-15 NOTE — Assessment & Plan Note (Signed)
stable overall by history and exam, recent data reviewed with pt, and pt to continue medical treatment as before,  to f/u any worsening symptoms or concerns Lab Results  Component Value Date   HGBA1C 5.8 06/08/2014

## 2014-12-15 NOTE — Progress Notes (Signed)
Subjective:    Patient ID: Brandi Shaffer, female    DOB: 09/10/1945, 69 y.o.   MRN: [id number]  HPI  Here for wellness and f/u;  Overall doing ok;  Pt denies Chest pain, worsening SOB, DOE, wheezing, orthopnea, PND, worsening LE edema, palpitations, dizziness or syncope.  Pt denies neurological change such as new headache, facial or extremity weakness.  Pt denies polydipsia, polyuria, or low sugar symptoms. Pt states overall good compliance with treatment and medications, good tolerability, and has been trying to follow appropriate diet.  Pt denies worsening depressive symptoms, suicidal ideation or panic. No fever, night sweats, wt loss, loss of appetite, or other constitutional symptoms.  Pt states good ability with ADL's, has low fall risk, home safety reviewed and adequate, no other significant changes in hearing or vision, and only occasionally active with exercise. Due for flu shot and podiatry. No new complaints. RLE improved with augmentin/cipro. MRI neg for sarcoma Past Medical History  Diagnosis Date  . ANEMIA-IRON DEFICIENCY 12/06/2009  . DIABETES MELLITUS, TYPE II 09/17/2006  . DIVERTICULOSIS, COLON 12/06/2009  . GENITAL HERPES 03/24/2010  . HYPERLIPIDEMIA 03/24/2010  . HYPERTENSION 09/17/2006  . HYPOTHYROIDISM 09/17/2006  . LIPOMA 12/06/2009  . PERIPHERAL EDEMA 03/24/2010  . GERD (gastroesophageal reflux disease) 06/28/2011  . Renal cyst 07/05/2011    1.9 cm minimally complex  . Asthma 12/05/2011   Past Surgical History  Procedure Laterality Date  . Tubal ligation    . Abdominal hysterectomy      fibroids  . Ankle surgury      x 3 left - chronic pain/swelling  . Tonsillectomy    . Shoulder surgery Left     reports that she has never smoked. She does not have any smokeless tobacco history on file. She reports that she does not drink alcohol or use illicit drugs. family history includes Diabetes in her other; Heart disease in her sister; Hypertension in her father and mother;  Joint hypermobility in her other; Prostate cancer in her brother; Stroke in her sister. Allergies  Allergen Reactions  . Januvia [Sitagliptin Phosphate]     dizziness  . Metformin And Related Nausea Only  . Doxycycline Rash   Current Outpatient Prescriptions on File Prior to Visit  Medication Sig Dispense Refill  . Acetaminophen-Codeine (TYLENOL/CODEINE #3) 300-30 MG per tablet Take 1 tablet by mouth every 6 (six) hours as needed for pain. 50 tablet 0  . amLODipine (NORVASC) 5 MG tablet TAKE 1 TABLET EVERY DAY 90 tablet 2  . amoxicillin-clavulanate (AUGMENTIN) 875-125 MG tablet Take 1 tablet by mouth 2 (two) times daily. 20 tablet 0  . Ascorbic Acid (VITAMIN C) 500 MG tablet Take 500 mg by mouth daily.      Marland Kitchen aspirin 81 MG EC tablet TAKE 1 TABLET BY MOUTH DAILY. SWALLOW WHOLE. 30 tablet 11  . Cholecalciferol (VITAMIN D) 400 UNITS tablet Take 400 Units by mouth daily.      . ciprofloxacin (CIPRO) 500 MG tablet Take 1 tablet (500 mg total) by mouth 2 (two) times daily. 20 tablet 0  . Coenzyme Q10 (COQ10) 100 MG capsule Take 100 mg by mouth daily.      . furosemide (LASIX) 20 MG tablet Take 1 tablet (20 mg total) by mouth daily. 30 tablet 3  . furosemide (LASIX) 20 MG tablet Take 1 tablet (20 mg total) by mouth daily. 30 tablet 3  . glucose blood test strip 1 each by Other route 2 (two) times daily. Use to check  blood sugars twice a day Dx E11.9 100 each 3  . ibuprofen (ADVIL,MOTRIN) 400 MG tablet TAKE 1 TABLET (400 MG TOTAL) BY MOUTH EVERY 6 (SIX) HOURS AS NEEDED FOR PAIN. 60 tablet 0  . ipratropium-albuterol (DUONEB) 0.5-2.5 (3) MG/3ML SOLN Take 3 mLs by nebulization every 6 (six) hours as needed. 360 mL 5  . irbesartan-hydrochlorothiazide (AVALIDE) 300-12.5 MG per tablet TAKE 1 TABLET BY MOUTH DAILY. 90 tablet 3  . KLOR-CON 10 10 MEQ tablet TAKE 1 TABLET BY MOUTH EVERY DAY 90 tablet 2  . naproxen sodium (ANAPROX) 220 MG tablet Take 220 mg by mouth daily as needed.      Letta Pate DELICA  LANCETS 33G MISC 1 each by Does not apply route 2 (two) times daily. Use to help check blood sugars twice a day Dx E11.9 100 each 3  . pioglitazone (ACTOS) 15 MG tablet TAKE 1 TABLET BY MOUTH EVERY DAY 90 tablet 0  . predniSONE (DELTASONE) 10 MG tablet 3 tabs by mouth per day for 3 days,2tabs per day for 3 days,1tab per day for 3 days 18 tablet 0  . PROAIR HFA 108 (90 BASE) MCG/ACT inhaler INHALE 2 PUFFS INTO THE LUNGS EVERY 6 (SIX) HOURS AS NEEDED FOR WHEEZING. 8.5 Inhaler 5  . vitamin B-12 (CYANOCOBALAMIN) 1000 MCG tablet Take 1,000 mcg by mouth daily.      . potassium chloride (KLOR-CON 10) 10 MEQ tablet Take 1 tablet (10 mEq total) by mouth daily. 90 tablet 3   No current facility-administered medications on file prior to visit.   Review of Systems Constitutional: Negative for increased diaphoresis, other activity, appetite or siginficant weight change other than noted HENT: Negative for worsening hearing loss, ear pain, facial swelling, mouth sores and neck stiffness.   Eyes: Negative for other worsening pain, redness or visual disturbance.  Respiratory: Negative for shortness of breath and wheezing  Cardiovascular: Negative for chest pain and palpitations.  Gastrointestinal: Negative for diarrhea, blood in stool, abdominal distention or other pain Genitourinary: Negative for hematuria, flank pain or change in urine volume.  Musculoskeletal: Negative for myalgias or other joint complaints.  Skin: Negative for color change and wound or drainage.  Neurological: Negative for syncope and numbness. other than noted Hematological: Negative for adenopathy. or other swelling Psychiatric/Behavioral: Negative for hallucinations, SI, self-injury, decreased concentration or other worsening agitation.      Objective:   Physical Exam BP 124/84 mmHg  Pulse 92  Ht 4\' 11"  (1.499 m)  Wt 192 lb (87.091 kg)  BMI 38.76 kg/m2  SpO2 97% VS noted,  Constitutional: Pt is oriented to person, place, and  time. Appears well-developed and well-nourished, in no significant distress Head: Normocephalic and atraumatic.  Right Ear: External ear normal.  Left Ear: External ear normal.  Nose: Nose normal.  Mouth/Throat: Oropharynx is clear and moist.  Eyes: Conjunctivae and EOM are normal. Pupils are equal, round, and reactive to light.  Neck: Normal range of motion. Neck supple. No JVD present. No tracheal deviation present or significant neck LA or mass Cardiovascular: Normal rate, regular rhythm, normal heart sounds and intact distal pulses.   Pulmonary/Chest: Effort normal and breath sounds without rales or wheezing  Abdominal: Soft. Bowel sounds are normal. NT. No HSM  Musculoskeletal: Normal range of motion. Exhibits no edema.  Lymphadenopathy:  Has no cervical adenopathy.  Neurological: Pt is alert and oriented to person, place, and time. Pt has normal reflexes. No cranial nerve deficit. Motor grossly intact Skin: Skin is warm and  dry. No rash noted.  Psychiatric:  Has normal mood and affect. Behavior is normal.     Assessment & Plan:

## 2014-12-15 NOTE — Assessment & Plan Note (Signed)
stable overall by history and exam, recent data reviewed with pt, and pt to continue medical treatment as before,  to f/u any worsening symptoms or concerns BP Readings from Last 3 Encounters:  12/15/14 124/84  12/13/14 138/82  11/24/14 142/88

## 2014-12-15 NOTE — Telephone Encounter (Signed)
Tried to call do discuss test results and referral

## 2014-12-15 NOTE — Progress Notes (Signed)
Pre visit review using our clinic review tool, if applicable. No additional management support is needed unless otherwise documented below in the visit note. 

## 2014-12-15 NOTE — Assessment & Plan Note (Signed)

## 2014-12-15 NOTE — Addendum Note (Signed)
Addended by: Cresenciano Lick on: 12/15/2014 10:37 AM   Modules accepted: Orders

## 2014-12-15 NOTE — Patient Instructions (Addendum)
You had the flu shot today  You will be contacted regarding the referral for: podiatry (foot doctor)  Please continue all other medications as before, and refills have been done if requested.  Please have the pharmacy call with any other refills you may need.  Please continue your efforts at being more active, low cholesterol diet, and weight control.  You are otherwise up to date with prevention measures today.  Please keep your appointments with your specialists as you may have planned  Please go to the LAB in the Basement (turn left off the elevator) for the tests to be done today  You will be contacted by phone if any changes need to be made immediately.  Otherwise, you will receive a letter about your results with an explanation, but please check with MyChart first.  Please remember to sign up for MyChart if you have not done so, as this will be important to you in the future with finding out test results, communicating by private email, and scheduling acute appointments online when needed.  Please return in 6 months, or sooner if needed, with Lab testing done 3-5 days before

## 2014-12-22 ENCOUNTER — Ambulatory Visit (INDEPENDENT_AMBULATORY_CARE_PROVIDER_SITE_OTHER): Payer: Medicare Other | Admitting: Family Medicine

## 2014-12-22 ENCOUNTER — Encounter: Payer: Self-pay | Admitting: Family Medicine

## 2014-12-22 VITALS — BP 134/80 | HR 94 | Ht 59.0 in | Wt 193.0 lb

## 2014-12-22 DIAGNOSIS — I739 Peripheral vascular disease, unspecified: Secondary | ICD-10-CM | POA: Diagnosis not present

## 2014-12-22 DIAGNOSIS — R609 Edema, unspecified: Secondary | ICD-10-CM

## 2014-12-22 DIAGNOSIS — L03115 Cellulitis of right lower limb: Secondary | ICD-10-CM

## 2014-12-22 NOTE — Assessment & Plan Note (Signed)
Discussed with patient that I would consider finishing of the antibiotic. Appears that there is no sign of infectious at this time. Of any recurrent happens to come back again. Otherwise patient in follow-up as needed.

## 2014-12-22 NOTE — Progress Notes (Signed)
Pre visit review using our clinic review tool, if applicable. No additional management support is needed unless otherwise documented below in the visit note. 

## 2014-12-22 NOTE — Progress Notes (Signed)
[name] [address] [place], [place] [address] Phone: [phone] Subjective:    I'm seeing this patient by the request  Of: Cathlean Cower, MD     CC:  Right large knee pain and swelling BLT:[id number] Brandi Shaffer is a 69 y.o. female coming in with complaint of  Right lower knee pain and swelling. Patient does have a past medical history significant for diabetes. Had this pain for quite some time and supplementary provider. Patient did have ultrasound to rule out deep venous thrombosis which was negative. Patient did have MRI showing signs and symptoms of potential cellulitis as well as dependent edema.  Patient was seen by me and ultrasound did confirm a potential for a cellulitis that did not seem to have any bony infiltration. Patient was put on medications as well as patient was given a diuretic to help with some of the dependent edema. Patient was sent for an ABI that did show in this foot she is having difficulty with blood flow to the large toe. Patient is a diabetic and now has a diagnosis of peripheral artery disease. Patient states she is feeling 90% better. Feels that the antibiotics were very helpful as well as the medication to help with the swelling. Patient states that she's been able to angulate. Still has some soreness but nothing that stopping her from activities. Patient feels that every day it seems to be getting better. Still has 2 days left of the antibiotic.  Past Medical History  Diagnosis Date  . ANEMIA-IRON DEFICIENCY 12/06/2009  . DIABETES MELLITUS, TYPE II 09/17/2006  . DIVERTICULOSIS, COLON 12/06/2009  . GENITAL HERPES 03/24/2010  . HYPERLIPIDEMIA 03/24/2010  . HYPERTENSION 09/17/2006  . HYPOTHYROIDISM 09/17/2006  . LIPOMA 12/06/2009  . PERIPHERAL EDEMA 03/24/2010  . GERD (gastroesophageal reflux disease) 06/28/2011  . Renal cyst 07/05/2011    1.9 cm minimally complex  . Asthma 12/05/2011   Past Surgical History  Procedure  Laterality Date  . Tubal ligation    . Abdominal hysterectomy      fibroids  . Ankle surgury      x 3 left - chronic pain/swelling  . Tonsillectomy    . Shoulder surgery Left    Social History  Substance Use Topics  . Smoking status: Never Smoker   . Smokeless tobacco: None  . Alcohol Use: No   Allergies  Allergen Reactions  . Januvia [Sitagliptin Phosphate]     dizziness  . Metformin And Related Nausea Only  . Doxycycline Rash   Family History  Problem Relation Age of Onset  . Hypertension Mother   . Hypertension Father   . Heart disease Sister     sudden death early 53's  . Stroke Sister   . Prostate cancer Brother     cancer  . Diabetes Other   . Joint hypermobility Other     DJD        Past medical history, social, surgical and family history all reviewed in electronic medical record.   Review of Systems: No headache, visual changes, nausea, vomiting, diarrhea, constipation, dizziness, abdominal pain, skin rash, fevers, chills, night sweats, weight loss, swollen lymph nodes, body aches, joint swelling, muscle aches, chest pain, shortness of breath, mood changes.   Objective Blood pressure 134/80, pulse 94, height 4\' 11"  (1.499 m), weight 193 lb (87.544 kg), SpO2 97 %.  General: No apparent distress alert and oriented x3 mood and affect normal, dressed appropriately.  HEENT: Pupils equal, extraocular movements  intact  Respiratory: Patient's speak in full sentences and does not appear short of breath  Cardiovascular: No lower extremity edema, non tender, no erythema  Skin: Warm dry intact with no signs of infection or rash on extremities or on axial skeleton.  Abdomen: Soft nontender  Neuro: Cranial nerves II through XII are intact, neurovascularly intact in all extremities with 2+ DTRs and 2+ pulses.  Lymph: No lymphadenopathy of posterior or anterior cervical chain or axillae bilaterally.  Gait  normal coordination and balance MSK:  Non tender with full range  of motion and good stability and symmetric strength and tone of shoulders, elbows, wrist, hip, knee and ankles bilaterally.   right lower extremity shows the patient does have hemosiderin deposits of the distal aspect of the ankle to the distal aspect of the calf. Significant less edema than previously.. Near same size of contralateral leg. Neurovascular intact distally with what appears to be a good capillary refill of the toes. Neurovascularly intact distally stated again with good deep tendon reflexes.       Impression and Recommendations:     This case required medical decision making of moderate complexity.

## 2014-12-22 NOTE — Patient Instructions (Signed)
Good to see you Continue the water pill Finish the antibiotics but then I think you are done Vascular will be calling you.  See me again when you need me.

## 2014-12-22 NOTE — Assessment & Plan Note (Signed)
Continue the Lasix low dose on an as-needed basis.

## 2015-01-05 ENCOUNTER — Other Ambulatory Visit: Payer: Self-pay | Admitting: Internal Medicine

## 2015-01-11 ENCOUNTER — Encounter: Payer: Self-pay | Admitting: Internal Medicine

## 2015-01-24 ENCOUNTER — Encounter: Payer: Self-pay | Admitting: Vascular Surgery

## 2015-01-26 ENCOUNTER — Ambulatory Visit (INDEPENDENT_AMBULATORY_CARE_PROVIDER_SITE_OTHER): Payer: Medicare Other | Admitting: Vascular Surgery

## 2015-01-26 ENCOUNTER — Encounter: Payer: Self-pay | Admitting: Vascular Surgery

## 2015-01-26 VITALS — BP 139/82 | HR 20 | Ht 59.0 in | Wt 195.0 lb

## 2015-01-26 DIAGNOSIS — I872 Venous insufficiency (chronic) (peripheral): Secondary | ICD-10-CM

## 2015-01-26 NOTE — Progress Notes (Signed)
[facility]  Patient name: Brandi Shaffer MRN: [id number] DOB: 04-22-1945 Sex: female  REASON FOR CONSULT: right lower extremity pain and swelling.  HPI: Brandi Shaffer is a 69 y.o. female, who is referred for evaluation of right lower extremity pain and swelling. She has noted some slight brownish discoloration to her lower leg on the right and also had problems with swelling. She states that recently this is actually improved some. She worked for many years standing on her feet at a [employer]. Of note she has 17 children. She denies any previous history of DVT or phlebitis.  She denies any history of claudication, rest pain, or nonhealing ulcers.  Past Medical History  Diagnosis Date  . ANEMIA-IRON DEFICIENCY 12/06/2009  . DIABETES MELLITUS, TYPE II 09/17/2006  . DIVERTICULOSIS, COLON 12/06/2009  . GENITAL HERPES 03/24/2010  . HYPERLIPIDEMIA 03/24/2010  . HYPERTENSION 09/17/2006  . HYPOTHYROIDISM 09/17/2006  . LIPOMA 12/06/2009  . PERIPHERAL EDEMA 03/24/2010  . GERD (gastroesophageal reflux disease) 06/28/2011  . Renal cyst 07/05/2011    1.9 cm minimally complex  . Asthma 12/05/2011    Family History  Problem Relation Age of Onset  . Hypertension Mother   . Hypertension Father   . Heart disease Sister     sudden death early 59's  . Stroke Sister   . Prostate cancer Brother     cancer  . Diabetes Other   . Joint hypermobility Other     DJD    SOCIAL HISTORY: Social History   Social History  . Marital Status: Divorced    Spouse Name: N/A  . Number of Children: 7  . Years of Education: N/A   Occupational History  . 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 Smoking status: Never Smoker   . Smokeless tobacco: Not on file  . Alcohol Use: No  . Drug Use: No  . Sexual Activity: Yes   Other Topics Concern  . Not on file   Social History Narrative   Lives with grandson and and pets;    Have 6 sons  and one dtr   Have grand- children; running children through out the day and cooking    Allergies  Allergen Reactions  . Januvia [Sitagliptin Phosphate]     dizziness  . Metformin And Related Nausea Only  . Doxycycline Rash    Current Outpatient Prescriptions  Medication Sig Dispense Refill  . Acetaminophen-Codeine (TYLENOL/CODEINE #3) 300-30 MG per tablet Take 1 tablet by mouth every 6 (six) hours as needed for pain. 50 tablet 0  . amLODipine (NORVASC) 5 MG tablet TAKE 1 TABLET EVERY DAY 90 tablet 2  . amoxicillin-clavulanate (AUGMENTIN) 875-125 MG tablet Take 1 tablet by mouth 2 (two) times daily. 20 tablet 0  . Ascorbic Acid (VITAMIN C) 500 MG tablet Take 500 mg by mouth daily.      Marland Kitchen aspirin 81 MG EC tablet TAKE 1 TABLET BY MOUTH DAILY. SWALLOW WHOLE. 30 tablet 11  . Cholecalciferol (VITAMIN D) 400 UNITS tablet Take 400 Units by mouth daily.      . ciprofloxacin (CIPRO) 500 MG tablet Take 1 tablet (500 mg total) by mouth 2 (two) times daily. 20 tablet 0  . Coenzyme Q10 (COQ10) 100 MG capsule Take 100 mg by mouth daily.      . furosemide (LASIX) 20 MG tablet Take 1 tablet (20 mg total) by mouth daily. 30 tablet 3  .  furosemide (LASIX) 20 MG tablet Take 1 tablet (20 mg total) by mouth daily. 30 tablet 3  . glucose blood test strip 1 each by Other route 2 (two) times daily. Use to check blood sugars twice a day Dx E11.9 100 each 3  . ibuprofen (ADVIL,MOTRIN) 400 MG tablet TAKE 1 TABLET (400 MG TOTAL) BY MOUTH EVERY 6 (SIX) HOURS AS NEEDED FOR PAIN. 60 tablet 0  . ipratropium-albuterol (DUONEB) 0.5-2.5 (3) MG/3ML SOLN Take 3 mLs by nebulization every 6 (six) hours as needed. 360 mL 5  . irbesartan-hydrochlorothiazide (AVALIDE) 300-12.5 MG per tablet TAKE 1 TABLET BY MOUTH DAILY. 90 tablet 3  . KLOR-CON 10 10 MEQ tablet TAKE 1 TABLET BY MOUTH EVERY DAY 90 tablet 2  . naproxen sodium (ANAPROX) 220 MG tablet Take 220 mg by mouth daily as needed.      Letta Pate DELICA LANCETS 33G MISC 1  each by Does not apply route 2 (two) times daily. Use to help check blood sugars twice a day Dx E11.9 100 each 3  . pioglitazone (ACTOS) 15 MG tablet TAKE 1 TABLET BY MOUTH EVERY DAY 90 tablet 3  . predniSONE (DELTASONE) 10 MG tablet 3 tabs by mouth per day for 3 days,2tabs per day for 3 days,1tab per day for 3 days 18 tablet 0  . PROAIR HFA 108 (90 BASE) MCG/ACT inhaler INHALE 2 PUFFS INTO THE LUNGS EVERY 6 (SIX) HOURS AS NEEDED FOR WHEEZING. 8.5 Inhaler 5  . vitamin B-12 (CYANOCOBALAMIN) 1000 MCG tablet Take 1,000 mcg by mouth daily.      . potassium chloride (KLOR-CON 10) 10 MEQ tablet Take 1 tablet (10 mEq total) by mouth daily. 90 tablet 3   No current facility-administered medications for this visit.    REVIEW OF SYSTEMS:  [X]  denotes positive finding, [ ]  denotes negative finding Cardiac  Comments:  Chest pain or chest pressure:    Shortness of breath upon exertion:    Short of breath when lying flat:    Irregular heart rhythm:        Vascular    Pain in calf, thigh, or hip brought on by ambulation:    Pain in feet at night that wakes you up from your sleep:     Blood clot in your veins:    Leg swelling:         Pulmonary    Oxygen at home:    Productive cough:     Wheezing:         Neurologic    Sudden weakness in arms or legs:     Sudden numbness in arms or legs:     Sudden onset of difficulty speaking or slurred speech:    Temporary loss of vision in one eye:     Problems with dizziness:         Gastrointestinal    Blood in stool:     Vomited blood:         Genitourinary    Burning when urinating:     Blood in urine:        Psychiatric    Major depression:         Hematologic    Bleeding problems:    Problems with blood clotting too easily:        Skin    Rashes or ulcers:        Constitutional    Fever or chills:      PHYSICAL EXAM: Filed Vitals:   01/26/15 1012  BP: 139/82  Pulse: 20  Height: 4\' 11"  (1.499 m)  Weight: 195 lb (88.451 kg)    SpO2: 97%    GENERAL: The patient is a well-nourished female, in no acute distress. The vital signs are documented above. CARDIAC: There is a regular rate and rhythm.  VASCULAR: I do not detect carotid bruits. She has palpable dorsalis pedis pulses bilaterally. She has bilateral lower extremity swelling. PULMONARY: There is good air exchange bilaterally without wheezing or rales. ABDOMEN: Soft and non-tender with normal pitched bowel sounds.  MUSCULOSKELETAL: There are no major deformities or cyanosis. NEUROLOGIC: No focal weakness or paresthesias are detected. SKIN: There are no ulcers or rashes noted. She has hyperpigmentation of the right leg. PSYCHIATRIC: The patient has a normal affect.  DATA:  I have reviewed the records that were sent with the patient.  The patient had a lower extremity arterial study on 12/13/2014 which showed an ABI of 100% bilaterally. Toe pressure on the right was 103 mmHg and toe pressure on the left was 109 mmHg.  The patient also had a venous duplex scan on 11/11/2014 which showed no evidence of right lower extremity DVT or superficial thrombophlebitis. No significant valvular incompetence was noted.  MEDICAL ISSUES:  CHRONIC VENOUS INSUFFICIENCY: Based on her exam, I think she has evidence of chronic venous insufficiency. This would fit with her history of having worked standing for over 20 years at a [employer]. We have explained the importance of daily leg elevation in the proper positioning for this. I have also written her a prescription for knee-high compression stockings with a gradient of 15-20 mmHg. I have encouraged her to avoid prolonged sitting and standing. We have discussed the importance of exercise and walking. She does belong to the [facility] and I have encouraged her to consider water aerobics as this is also helpful for people with chronic venous insufficiency. I reassured her that she has excellent arterial flow based on her arterial  Doppler study and her exam. I will see her back as needed.   Waverly Ferrari [facility]: [phone]

## 2015-01-31 DIAGNOSIS — E119 Type 2 diabetes mellitus without complications: Secondary | ICD-10-CM | POA: Diagnosis not present

## 2015-01-31 LAB — HM DIABETES EYE EXAM

## 2015-02-09 ENCOUNTER — Telehealth: Payer: Self-pay | Admitting: *Deleted

## 2015-02-09 NOTE — Telephone Encounter (Signed)
Needs to wait for Dr. Jenny Reichmann to return

## 2015-02-09 NOTE — Telephone Encounter (Signed)
Called pt she stated yes due to weather she is having back & leg pain. Body is just aching...Brandi Shaffer

## 2015-02-09 NOTE — Telephone Encounter (Signed)
Looks like this was given 9/14 for leg pain - it is not a chronic medication.  Is she still having right leg pain?

## 2015-02-09 NOTE — Telephone Encounter (Signed)
Received call pt is needing refill on her Tylenol #3. MD out of office pls advise...Johny Chess

## 2015-02-09 NOTE — Telephone Encounter (Signed)
Notified pt with md response. Pt states she will wait until md return on Tuesday...Johny Chess

## 2015-02-10 NOTE — Telephone Encounter (Signed)
Since the previous prescription was for pain related to infection (acute pain), pt would need ROV for further refills, as hopefully she would not need this for infection now

## 2015-02-10 NOTE — Telephone Encounter (Signed)
Notified pt with md response. Made appt to see md 02/15/15 per pt request.../lmb

## 2015-02-15 ENCOUNTER — Ambulatory Visit (INDEPENDENT_AMBULATORY_CARE_PROVIDER_SITE_OTHER): Payer: Medicare Other | Admitting: Internal Medicine

## 2015-02-15 ENCOUNTER — Encounter: Payer: Self-pay | Admitting: Internal Medicine

## 2015-02-15 VITALS — BP 110/74 | HR 98 | Temp 97.9°F | Ht 59.0 in | Wt 192.0 lb

## 2015-02-15 DIAGNOSIS — I10 Essential (primary) hypertension: Secondary | ICD-10-CM

## 2015-02-15 DIAGNOSIS — E119 Type 2 diabetes mellitus without complications: Secondary | ICD-10-CM | POA: Diagnosis not present

## 2015-02-15 DIAGNOSIS — M79605 Pain in left leg: Secondary | ICD-10-CM | POA: Diagnosis not present

## 2015-02-15 DIAGNOSIS — M79604 Pain in right leg: Secondary | ICD-10-CM | POA: Diagnosis not present

## 2015-02-15 MED ORDER — ACETAMINOPHEN-CODEINE 300-30 MG PO TABS: 1.0000 | ORAL_TABLET | Freq: Four times a day (QID) | ORAL | Status: DC | PRN

## 2015-02-15 MED ORDER — GABAPENTIN 100 MG PO CAPS: 100.0000 mg | ORAL_CAPSULE | Freq: Three times a day (TID) | ORAL | Status: DC

## 2015-02-15 NOTE — Progress Notes (Signed)
Pre visit review using our clinic review tool, if applicable. No additional management support is needed unless otherwise documented below in the visit note. 

## 2015-02-15 NOTE — Patient Instructions (Signed)
Please take all new medication as prescribed - the gabapentin 100 mg - to start this you should take 1 pill at bedtime for 2 nights, then 1 pill twice per day for 2 days, then 1 pill at three times per day after that  Please call in 1 week if you are not improved, to increase the dose to 300 mg  If this dose does not help after a few days, you can stop after that  If the 100 mg dose helps, then you should just continue that  Please continue all other medications as before, and refills have been done if requested - the tylenol #3 (but hopefully you will not need this much if the gabapentin helps)  Please have the pharmacy call with any other refills you may need.  Please continue your efforts at being more active, low cholesterol diet, and weight control.  Please keep your appointments with your specialists as you may have planned

## 2015-02-15 NOTE — Progress Notes (Signed)
Subjective:    Patient ID: Brandi Shaffer, female    DOB: 08-28-1945, 69 y.o.   MRN: [id number]  HPI  Here to f/u, c/o persistent distal mostly RLE pain despite recent eval and tx aimed toward msk and vascular evaluations essentially neg;  Pt denies chest pain, increased sob or doe, wheezing, orthopnea, PND, increased LE swelling, palpitations, dizziness or syncope.  Pt denies new neurological symptoms such as new headache, or facial or extremity weakness or numbness  Pt continues with No LBP bowel or bladder change, fever, wt loss,  Worsening LE weakness, gait change or falls.  Pt denies fever, wt loss, night sweats, loss of appetite, or other constitutional symptoms Past Medical History  Diagnosis Date  . ANEMIA-IRON DEFICIENCY 12/06/2009  . DIABETES MELLITUS, TYPE II 09/17/2006  . DIVERTICULOSIS, COLON 12/06/2009  . GENITAL HERPES 03/24/2010  . HYPERLIPIDEMIA 03/24/2010  . HYPERTENSION 09/17/2006  . HYPOTHYROIDISM 09/17/2006  . LIPOMA 12/06/2009  . PERIPHERAL EDEMA 03/24/2010  . GERD (gastroesophageal reflux disease) 06/28/2011  . Renal cyst 07/05/2011    1.9 cm minimally complex  . Asthma 12/05/2011   Past Surgical History  Procedure Laterality Date  . Tubal ligation    . Abdominal hysterectomy      fibroids  . Ankle surgury      x 3 left - chronic pain/swelling  . Tonsillectomy    . Shoulder surgery Left     reports that she has never smoked. She does not have any smokeless tobacco history on file. She reports that she does not drink alcohol or use illicit drugs. family history includes Diabetes in her other; Heart disease in her sister; Hypertension in her father and mother; Joint hypermobility in her other; Prostate cancer in her brother; Stroke in her sister. Allergies  Allergen Reactions  . Januvia [Sitagliptin Phosphate]     dizziness  . Metformin And Related Nausea Only  . Doxycycline Rash   Current Outpatient Prescriptions on File Prior to Visit  Medication Sig Dispense  Refill  . amLODipine (NORVASC) 5 MG tablet TAKE 1 TABLET EVERY DAY 90 tablet 2  . Ascorbic Acid (VITAMIN C) 500 MG tablet Take 500 mg by mouth daily.      Marland Kitchen aspirin 81 MG EC tablet TAKE 1 TABLET BY MOUTH DAILY. SWALLOW WHOLE. 30 tablet 11  . Cholecalciferol (VITAMIN D) 400 UNITS tablet Take 400 Units by mouth daily.      . Coenzyme Q10 (COQ10) 100 MG capsule Take 100 mg by mouth daily.      . furosemide (LASIX) 20 MG tablet Take 1 tablet (20 mg total) by mouth daily. 30 tablet 3  . glucose blood test strip 1 each by Other route 2 (two) times daily. Use to check blood sugars twice a day Dx E11.9 100 each 3  . ibuprofen (ADVIL,MOTRIN) 400 MG tablet TAKE 1 TABLET (400 MG TOTAL) BY MOUTH EVERY 6 (SIX) HOURS AS NEEDED FOR PAIN. 60 tablet 0  . ipratropium-albuterol (DUONEB) 0.5-2.5 (3) MG/3ML SOLN Take 3 mLs by nebulization every 6 (six) hours as needed. 360 mL 5  . irbesartan-hydrochlorothiazide (AVALIDE) 300-12.5 MG per tablet TAKE 1 TABLET BY MOUTH DAILY. 90 tablet 3  . KLOR-CON 10 10 MEQ tablet TAKE 1 TABLET BY MOUTH EVERY DAY 90 tablet 2  . naproxen sodium (ANAPROX) 220 MG tablet Take 220 mg by mouth daily as needed.      Letta Pate DELICA LANCETS 33G MISC 1 each by Does not apply route 2 (two) times  daily. Use to help check blood sugars twice a day Dx E11.9 100 each 3  . pioglitazone (ACTOS) 15 MG tablet TAKE 1 TABLET BY MOUTH EVERY DAY 90 tablet 3  . PROAIR HFA 108 (90 BASE) MCG/ACT inhaler INHALE 2 PUFFS INTO THE LUNGS EVERY 6 (SIX) HOURS AS NEEDED FOR WHEEZING. 8.5 Inhaler 5  . vitamin B-12 (CYANOCOBALAMIN) 1000 MCG tablet Take 1,000 mcg by mouth daily.      Marland Kitchen amoxicillin-clavulanate (AUGMENTIN) 875-125 MG tablet Take 1 tablet by mouth 2 (two) times daily. (Patient not taking: Reported on 02/15/2015) 20 tablet 0  . ciprofloxacin (CIPRO) 500 MG tablet Take 1 tablet (500 mg total) by mouth 2 (two) times daily. (Patient not taking: Reported on 02/15/2015) 20 tablet 0  . potassium chloride  (KLOR-CON 10) 10 MEQ tablet Take 1 tablet (10 mEq total) by mouth daily. 90 tablet 3  . predniSONE (DELTASONE) 10 MG tablet 3 tabs by mouth per day for 3 days,2tabs per day for 3 days,1tab per day for 3 days (Patient not taking: Reported on 02/15/2015) 18 tablet 0   No current facility-administered medications on file prior to visit.   Review of Systems  Constitutional: Negative for unusual diaphoresis or night sweats HENT: Negative for ringing in ear or discharge Eyes: Negative for double vision or worsening visual disturbance.  Respiratory: Negative for choking and stridor.   Gastrointestinal: Negative for vomiting or other signifcant bowel change Genitourinary: Negative for hematuria or change in urine volume.  Musculoskeletal: Negative for other MSK pain or swelling Skin: Negative for color change and worsening wound.  Neurological: Negative for tremors and numbness other than noted  Psychiatric/Behavioral: Negative for decreased concentration or agitation other than above       Objective:   Physical Exam BP 110/74 mmHg  Pulse 98  Temp(Src) 97.9 F (36.6 C) (Oral)  Ht 4\' 11"  (1.499 m)  Wt 192 lb (87.091 kg)  BMI 38.76 kg/m2  SpO2 98% VS noted,  Constitutional: Pt appears in no significant distress HENT: Head: NCAT.  Right Ear: External ear normal.  Left Ear: External ear normal.  Eyes: . Pupils are equal, round, and reactive to light. Conjunctivae and EOM are normal Neck: Normal range of motion. Neck supple.  Cardiovascular: Normal rate and regular rhythm.   Pulmonary/Chest: Effort normal and breath sounds without rales or wheezing.  Bilat LE dorsalis pedis 1+ bilat No LE tender, red, swelling, ulcer  Neurological: Pt is alert. Not confused , motor 5/5 intact, gait intact Skin: Skin is warm. No rash, no LE edema Psychiatric: Pt behavior is normal. No agitation.     Assessment & Plan:

## 2015-02-21 NOTE — Assessment & Plan Note (Signed)
stable overall by history and exam, recent data reviewed with pt, and pt to continue medical treatment as before,  to f/u any worsening symptoms or concerns BP Readings from Last 3 Encounters:  02/15/15 110/74  01/26/15 139/82  12/22/14 134/80

## 2015-02-21 NOTE — Assessment & Plan Note (Signed)
stable overall by history and exam, recent data reviewed with pt, and pt to continue medical treatment as before,  to f/u any worsening symptoms or concerns Lab Results  Component Value Date   HGBA1C 5.8 06/08/2014    

## 2015-02-21 NOTE — Assessment & Plan Note (Signed)
Pt with persistent unexplained LE pain, ? Neuritic, has no LE pain, declines NCS/EMG for now, will try trial of gabapentin asd, consider f/u neuro testing or neuro consult

## 2015-03-08 ENCOUNTER — Other Ambulatory Visit: Payer: Self-pay | Admitting: Family Medicine

## 2015-03-09 NOTE — Telephone Encounter (Signed)
Called pt to see if she needs a refill on this med. Per pt she is no longer taking the lasix & does not need a refill.  Refill denied.

## 2015-04-01 ENCOUNTER — Ambulatory Visit (INDEPENDENT_AMBULATORY_CARE_PROVIDER_SITE_OTHER): Payer: Medicare Other | Admitting: Family

## 2015-04-01 ENCOUNTER — Encounter: Payer: Self-pay | Admitting: Family

## 2015-04-01 VITALS — BP 132/78 | HR 85 | Temp 97.9°F | Resp 16 | Ht 59.0 in | Wt 195.0 lb

## 2015-04-01 DIAGNOSIS — L03114 Cellulitis of left upper limb: Secondary | ICD-10-CM | POA: Diagnosis not present

## 2015-04-01 DIAGNOSIS — L0390 Cellulitis, unspecified: Secondary | ICD-10-CM

## 2015-04-01 DIAGNOSIS — M19172 Post-traumatic osteoarthritis, left ankle and foot: Secondary | ICD-10-CM

## 2015-04-01 DIAGNOSIS — M19072 Primary osteoarthritis, left ankle and foot: Secondary | ICD-10-CM | POA: Insufficient documentation

## 2015-04-01 HISTORY — DX: Cellulitis, unspecified: L03.90

## 2015-04-01 MED ORDER — SULFAMETHOXAZOLE-TRIMETHOPRIM 800-160 MG PO TABS: 1.0000 | ORAL_TABLET | Freq: Two times a day (BID) | ORAL | Status: DC

## 2015-04-01 NOTE — Assessment & Plan Note (Signed)
Concern for non-purulent cellulitis of the left leg with increased warmth and redness. Start Bactrim. Continue elevation of leg. Discussed symptoms of worsening infection and when to seek further care. Follow up if symptoms do not improve.

## 2015-04-01 NOTE — Progress Notes (Signed)
Pre visit review using our clinic review tool, if applicable. No additional management support is needed unless otherwise documented below in the visit note. 

## 2015-04-01 NOTE — Patient Instructions (Signed)
Thank you for choosing [facility].  Summary/Instructions:  Your prescription(s) have been submitted to your pharmacy or been printed and provided for you. Please take as directed and contact our office if you believe you are having problem(s) with the medication(s) or have any questions.  If your symptoms worsen or fail to improve, please contact our office for further instruction, or in case of emergency go directly to the emergency room at the closest medical facility.   Please start Tumeric 500 mg daily  Please increase Vitamin D 2000 IU daily. Ice regularly Complete the antibiotic Continue to keep your legs elevated

## 2015-04-01 NOTE — Assessment & Plan Note (Signed)
Symptoms and exam consistent with osteoarthritis of the left ankle most likely related to previous surgeries from a fall. Discussed pain management measures. Start Tumeric and increase vitamin D. Continue to ice. Cam walker as needed for relief. Follow up if symptoms do not improve.

## 2015-04-01 NOTE — Progress Notes (Signed)
Subjective:    Patient ID: Brandi Shaffer, female    DOB: [birth year], 70 y.o.   MRN: [id number]  Chief Complaint  Patient presents with  . Leg Pain    Left leg pain that she states that she had surgery on years ago, that hurts mainly in winter, Dr. Jonny Ruiz took her off of hydrocodone bc he didnt want her to get hooked on them but thats the only thing that helps pain    HPI:  Brandi Shaffer is a 70 y.o. female who  has a past medical history of ANEMIA-IRON DEFICIENCY (12/06/2009); DIABETES MELLITUS, TYPE II (09/17/2006); DIVERTICULOSIS, COLON (12/06/2009); GENITAL HERPES (03/24/2010); HYPERLIPIDEMIA (03/24/2010); HYPERTENSION (09/17/2006); HYPOTHYROIDISM (09/17/2006); LIPOMA (12/06/2009); PERIPHERAL EDEMA (03/24/2010); GERD (gastroesophageal reflux disease) (06/28/2011); Renal cyst (07/05/2011); and Asthma (12/05/2011). and presents today for an office visit.   This is a new problem. Associated symptom of pain and redness located in her left leg has been going on for several days. There has been pain following a fall and surgical repair a couple of years ago. Pain is described as toothache-like and has been worsening over the past 2 weeks. Modifying factors include wearing her boot which does help to take the pressure off. Aggravating factors include the winter season which makes it worse.   Allergies  Allergen Reactions  . Januvia [Sitagliptin Phosphate]     dizziness  . Metformin And Related Nausea Only  . Doxycycline Rash     Current Outpatient Prescriptions on File Prior to Visit  Medication Sig Dispense Refill  . Acetaminophen-Codeine (TYLENOL/CODEINE #3) 300-30 MG tablet Take 1 tablet by mouth every 6 (six) hours as needed for pain. 50 tablet 0  . amLODipine (NORVASC) 5 MG tablet TAKE 1 TABLET EVERY DAY 90 tablet 2  . Ascorbic Acid (VITAMIN C) 500 MG tablet Take 500 mg by mouth daily.      Marland Kitchen aspirin 81 MG EC tablet TAKE 1 TABLET BY MOUTH DAILY. SWALLOW WHOLE. 30 tablet 11  . Cholecalciferol  (VITAMIN D) 400 UNITS tablet Take 400 Units by mouth daily.      . Coenzyme Q10 (COQ10) 100 MG capsule Take 100 mg by mouth daily.      . furosemide (LASIX) 20 MG tablet Take 1 tablet (20 mg total) by mouth daily. 30 tablet 3  . gabapentin (NEURONTIN) 100 MG capsule Take 1 capsule (100 mg total) by mouth 3 (three) times daily. 90 capsule 5  . glucose blood test strip 1 each by Other route 2 (two) times daily. Use to check blood sugars twice a day Dx E11.9 100 each 3  . ibuprofen (ADVIL,MOTRIN) 400 MG tablet TAKE 1 TABLET (400 MG TOTAL) BY MOUTH EVERY 6 (SIX) HOURS AS NEEDED FOR PAIN. 60 tablet 0  . ipratropium-albuterol (DUONEB) 0.5-2.5 (3) MG/3ML SOLN Take 3 mLs by nebulization every 6 (six) hours as needed. 360 mL 5  . irbesartan-hydrochlorothiazide (AVALIDE) 300-12.5 MG per tablet TAKE 1 TABLET BY MOUTH DAILY. 90 tablet 3  . KLOR-CON 10 10 MEQ tablet TAKE 1 TABLET BY MOUTH EVERY DAY 90 tablet 2  . ONETOUCH DELICA LANCETS 33G MISC 1 each by Does not apply route 2 (two) times daily. Use to help check blood sugars twice a day Dx E11.9 100 each 3  . pioglitazone (ACTOS) 15 MG tablet TAKE 1 TABLET BY MOUTH EVERY DAY 90 tablet 3  . predniSONE (DELTASONE) 10 MG tablet 3 tabs by mouth per day for 3 days,2tabs per day for 3 days,1tab per  day for 3 days 18 tablet 0  . PROAIR HFA 108 (90 BASE) MCG/ACT inhaler INHALE 2 PUFFS INTO THE LUNGS EVERY 6 (SIX) HOURS AS NEEDED FOR WHEEZING. 8.5 Inhaler 5  . vitamin B-12 (CYANOCOBALAMIN) 1000 MCG tablet Take 1,000 mcg by mouth daily.      . potassium chloride (KLOR-CON 10) 10 MEQ tablet Take 1 tablet (10 mEq total) by mouth daily. 90 tablet 3   No current facility-administered medications on file prior to visit.   Review of Systems  Constitutional: Negative for fever and chills.  Musculoskeletal:       Positive for left lower extremity pain.   Neurological: Negative for weakness and numbness.      Objective:    BP 132/78 mmHg  Pulse 85  Temp(Src) 97.9  F (36.6 C) (Oral)  Resp 16  Ht 4\' 11"  (1.499 m)  Wt 195 lb (88.451 kg)  BMI 39.36 kg/m2  SpO2 99% Nursing note and vital signs reviewed.  Physical Exam  Constitutional: She is oriented to person, place, and time. She appears well-developed and well-nourished. No distress.  Musculoskeletal:  Left lower extremity - Mild redness and edema with no obvious deformity. Previous scars noted from surgery appear well healed. There is diffuse tenderness of the distal third of the lower leg mostly anterior. No purulent discharge. Pulses, capillary refill and sensations are intact and appropriate.   Neurological: She is alert and oriented to person, place, and time.  Skin: Skin is warm and dry.  Psychiatric: She has a normal mood and affect. Her behavior is normal. Judgment and thought content normal.       Assessment & Plan:   Problem List Items Addressed This Visit      Musculoskeletal and Integument   Osteoarthritis of left ankle    Symptoms and exam consistent with osteoarthritis of the left ankle most likely related to previous surgeries from a fall. Discussed pain management measures. Start Tumeric and increase vitamin D. Continue to ice. Cam walker as needed for relief. Follow up if symptoms do not improve.         Other   Cellulitis - Primary    Concern for non-purulent cellulitis of the left leg with increased warmth and redness. Start Bactrim. Continue elevation of leg. Discussed symptoms of worsening infection and when to seek further care. Follow up if symptoms do not improve.       Relevant Medications   sulfamethoxazole-trimethoprim (BACTRIM DS,SEPTRA DS) 800-160 MG tablet

## 2015-04-22 ENCOUNTER — Other Ambulatory Visit: Payer: Self-pay | Admitting: Internal Medicine

## 2015-05-16 ENCOUNTER — Ambulatory Visit (INDEPENDENT_AMBULATORY_CARE_PROVIDER_SITE_OTHER): Payer: Medicare Other

## 2015-05-16 ENCOUNTER — Encounter: Payer: Self-pay | Admitting: Podiatry

## 2015-05-16 ENCOUNTER — Ambulatory Visit (INDEPENDENT_AMBULATORY_CARE_PROVIDER_SITE_OTHER): Payer: Medicare Other | Admitting: Podiatry

## 2015-05-16 VITALS — BP 128/70 | HR 84 | Resp 16 | Ht 59.0 in | Wt 182.0 lb

## 2015-05-16 DIAGNOSIS — M779 Enthesopathy, unspecified: Secondary | ICD-10-CM

## 2015-05-16 DIAGNOSIS — S93402A Sprain of unspecified ligament of left ankle, initial encounter: Secondary | ICD-10-CM

## 2015-05-16 NOTE — Progress Notes (Signed)
Subjective:    Patient ID: Brandi Shaffer, female    DOB: 08/25/1945, 70 y.o.   MRN: [id number]  HPI Patient presents with ankle pain in their left foot-medial side. Pt stated, "had surgery on ankle in 1998 or 1999 by Dr. Montez Morita".  Pt diabetic type 2; sugar=did not take today; A1C=5.1; x3 months   Review of Systems  All other systems reviewed and are negative.      Objective:   Physical Exam        Assessment & Plan:

## 2015-05-17 NOTE — Progress Notes (Signed)
Subjective:     Patient ID: Brandi Shaffer, female   DOB: [birth year], 70 y.o.   MRN: [id number]  HPI patient presents in relatively poor health who has edema and pain in the medial side of the left ankle with history of having had compound fracture that was corrected and problems ever since. There is a pain now that is new and it seems more distal than the problems associated with her ankle   Review of Systems  All other systems reviewed and are negative.      Objective:   Physical Exam  Constitutional: She is oriented to person, place, and time.  Cardiovascular: Intact distal pulses.   Musculoskeletal: Normal range of motion.  Neurological: She is oriented to person, place, and time.  Skin: Skin is warm.  Nursing note and vitals reviewed.  Neurovascular status intact muscle strength adequate range of motion was found to be diminished on the left side secondary to previous injury. Patient has mild to moderate edema in the left ankle negative Homan sign was noted and quite a bit of pain in the posterior tibial tendon as it comes under the medial malleolus and inserts into the navicular. I did checked muscle strength of posterior tib and it appears to be adequate and I noted good digital perfusion and patient to be well oriented 3     Assessment:     Chronic arthritis of the ankle left secondary to previous fracture with tendinitis of the posterior tibial tendon left    Plan:     H&P and conditions reviewed with patient. Along with x-ray. Today I did careful sheath injection of the posterior tib left and applied fascial brace to reduce stress against the ankle. I did explain this is not meant to cure all of her problems but I'm hoping to make a difference for her. I will see her back again in the next few weeks to reevaluate and may require AFO type brace  X-ray report indicate significant hardware in the right fibula or tibia with arthritis around the ankle joint. Moderate depression of  the arch noted

## 2015-05-30 ENCOUNTER — Ambulatory Visit (INDEPENDENT_AMBULATORY_CARE_PROVIDER_SITE_OTHER): Payer: Medicare Other | Admitting: Podiatry

## 2015-05-30 ENCOUNTER — Encounter: Payer: Self-pay | Admitting: Podiatry

## 2015-05-30 DIAGNOSIS — S93402A Sprain of unspecified ligament of left ankle, initial encounter: Secondary | ICD-10-CM | POA: Diagnosis not present

## 2015-05-30 DIAGNOSIS — M779 Enthesopathy, unspecified: Secondary | ICD-10-CM

## 2015-05-31 NOTE — Progress Notes (Signed)
Subjective:     Patient ID: Brandi Shaffer, female   DOB: 1945/09/04, 70 y.o.   MRN: [id number]  HPI patient states I'm feeling some better but still having discomfort. Points to the left lateral ankle   Review of Systems     Objective:   Physical Exam Neurovascular status intact muscle strength adequate with continued discomfort lateral ankle left with improvement but pain with deep palpation    Assessment:     Continued lateral tendinitis of the peroneal group    Plan:     Reviewed at great length and advised physical therapy anti-inflammatories supportive shoes and reduced activity. Reappoint if symptoms were to persist or get worse

## 2015-06-30 ENCOUNTER — Ambulatory Visit (INDEPENDENT_AMBULATORY_CARE_PROVIDER_SITE_OTHER): Payer: Medicare Other

## 2015-06-30 VITALS — BP 180/110 | Ht 59.0 in | Wt 196.2 lb

## 2015-06-30 DIAGNOSIS — Z0000 Encounter for general adult medical examination without abnormal findings: Secondary | ICD-10-CM | POA: Diagnosis not present

## 2015-06-30 NOTE — Progress Notes (Addendum)
Subjective:   Brandi Shaffer is a 70 y.o. female who presents for Medicare Annual (Subsequent) preventive examination.  Review of Systems:  HRA assessment completed during visit; [name]  The Patient was informed that this wellness visit is to identify risk and educate on how to reduce risk for increase disease through lifestyle changes.   ROS deferred to CPE exam with physician;  Family and medical hx given below; mother HTN Father had HTN; HD; Stroke;   Lifestyle review:  Hx Tendonitis HTN: very high today; admits to being very stressed;  Hyperlipidemia: 05/2014; cho 185; trig 60; HDL 55; LDL 117 DM; last A1c 5.8; trending down since 2013; (BS 110) Obesity   Tobacco: never smoked  Secondary smoke? no ETOH: no  Medication review/ Adherence   BMI: 57.4 Diet; doesn't eat meals;  Skips breakfast;  May eat orange; drinks coffee Eats at 2pm; cooks for the kids coming home from school; snacks on nuts; pumpkin seeds; sunflower seeds throughout the day Supper; salad, vegetables; cracker; hot dog last pm; ice cream;  Eats greens; baked foods; broils Eats the same way she has eaten all of her life but "i am not losing weight"   Educated that skipping breakfast will not help her to lose weight and recommended she eat light in am; protein drink, yogurt; fruit;  [facility] for [facility] education but agrees her "real problem" is stress.  Exercise;  Rides her bike Walks a lot  Stays active and busy;  Walks at least 60 minutes x 5 days a week ( through out the day)  Loves to go with the grand-kids;    HOME SAFETY;  Support:  grandson; 25 yo; going to college/ lives with her long term Home:  Has New dog;  lab mix; she brings her joy; Quite and calm    Bathroom safety; no issues with ADL's or bathroom at this time [employer] safety;  Smoke detectors yes Firearms safety reviewed and will keep in a safe place if these  exist.  Stressors (1-5) states she is very stressed;  Unhappy with lifestyle Upset over grandson and college choices; [name] has lived with her for many years;  Met female friend about 4 months ago; Relationship is platonic but not going well; let the patient ventilate and review her options; BP is elevated and will need to prioritize her health. Agrees to make a few decisions to decrease her stress; Given [employer] health number for fup if she continues to have ongoing stress unresolved.   Depression: Denies feeling depressed or hopeless; voices pleasure in daily life; Mood stable; no issues  Cognitive; Presents with no issues;  Engaged in assessment Manages checkbook, medications; no failures of task Ad8 score reviewed for issues;  Fall assessment; no  Mobilization and Functional losses from last year to this year? no  Counseling Health Maintenance [facility]; Dr. Paulla Dolly; [phone] Colonoscopy; 01/16/2010; 12/2019 EKG: 12/05/2011  Mammogram 01/2014: need to schedule mammogram; [facility];  Dexa 05/2008; normal; no issues PAP: Dr. Jenny Reichmann refers;   Hearing: _0  both ears   Ophthalmology exam; completed 12/ 12/ 16/ told her eyes were good;  [address]; ; Dr. Zadie Rhine; [facility]; Call to confirmed eye visit 01/31/2015 and new apt scheduled for 01/2016; to fax report  Immunizations Due: zostavax  PSV 23 when she was 52; Explained CDC; can discuss with Dr. Jenny Reichmann; BP elevated and stressed over issues;  deferred today Zostavax; part D   Advanced Directive; deferred due to discussing her stress Will send out copy   Health Recommendations and Referrals Referred to [facility] health if she has issues with ongoing stress;  Will fup with Dr. Jenny Reichmann regarding BP    Current Care Team reviewed and updated       Objective:     Vitals: BP 180/110 mmHg  Ht _0  (1.499 m)  Wt 196 lb 4 oz (89.018 kg)  BMI  39.62 kg/m2  Body mass index is 39.62 kg/(m^2).   Tobacco History  Smoking status  . Never Smoker   Smokeless tobacco  . Not on file     Counseling given: Not Answered   Past Medical History  Diagnosis Date  . ANEMIA-IRON DEFICIENCY 12/06/2009  . DIABETES MELLITUS, TYPE II 09/17/2006  . DIVERTICULOSIS, COLON 12/06/2009  . GENITAL HERPES 03/24/2010  . HYPERLIPIDEMIA 03/24/2010  . HYPERTENSION 09/17/2006  . HYPOTHYROIDISM 09/17/2006  . LIPOMA 12/06/2009  . PERIPHERAL EDEMA 03/24/2010  . GERD (gastroesophageal reflux disease) 06/28/2011  . Renal cyst 07/05/2011    1.9 cm minimally complex  . Asthma 12/05/2011   Past Surgical History  Procedure Laterality Date  . Tubal ligation    . Abdominal hysterectomy      fibroids  . Ankle surgury      x 3 left - chronic pain/swelling  . Tonsillectomy    . Shoulder surgery Left    Family History  Problem Relation Age of Onset  . Hypertension Mother   . Hypertension Father   . Heart disease Sister     sudden death early 36's  . Stroke Sister   . Prostate cancer Brother     cancer  . Diabetes Other   . Joint hypermobility Other     DJD   History  Sexual Activity  . Sexual Activity: Yes    Outpatient Encounter Prescriptions as of 06/30/2015  Medication Sig  . amLODipine (NORVASC) 5 MG tablet TAKE 1 TABLET EVERY DAY  . Ascorbic Acid (VITAMIN C) 500 MG tablet Take 500 mg by mouth daily.    Marland Kitchen aspirin 81 MG EC tablet TAKE 1 TABLET BY MOUTH DAILY. SWALLOW WHOLE.  Marland Kitchen Cholecalciferol (VITAMIN D) 400 UNITS tablet Take 400 Units by mouth daily.    . Coenzyme Q10 (COQ10) 100 MG capsule Take 100 mg by mouth daily.    Marland Kitchen glucose blood test strip 1 each by Other route 2 (two) times daily. Use to check blood sugars twice a day Dx E11.9  . ibuprofen (ADVIL,MOTRIN) 400 MG tablet TAKE 1 TABLET (400 MG TOTAL) BY MOUTH EVERY 6 (SIX) HOURS AS NEEDED FOR PAIN.  Marland Kitchen ipratropium-albuterol (DUONEB) 0.5-2.5 (3) MG/3ML SOLN Take 3 mLs by nebulization every 6  (six) hours as needed.  . irbesartan-hydrochlorothiazide (AVALIDE) 300-12.5 MG tablet TAKE 1 TABLET BY MOUTH DAILY.  Marland Kitchen KLOR-CON 10 10 MEQ tablet TAKE 1 TABLET BY MOUTH EVERY DAY  . ONETOUCH DELICA LANCETS 66Z MISC 1 each by Does not apply route 2 (two) times daily. Use to help check blood sugars twice a day Dx E11.9  . pioglitazone (ACTOS) 15 MG tablet TAKE 1 TABLET BY MOUTH EVERY DAY  . PROAIR HFA 108 (90 BASE) MCG/ACT inhaler INHALE 2 PUFFS INTO THE LUNGS EVERY 6 (SIX) HOURS AS NEEDED FOR WHEEZING.  . vitamin B-12 (CYANOCOBALAMIN) 1000 MCG tablet Take 1,000 mcg by mouth daily.    . Acetaminophen-Codeine (TYLENOL/CODEINE #3) 300-30 MG tablet Take 1 tablet by mouth  every 6 (six) hours as needed for pain. (Patient not taking: Reported on 06/30/2015)  . furosemide (LASIX) 20 MG tablet Take 1 tablet (20 mg total) by mouth daily. (Patient not taking: Reported on 06/30/2015)  . gabapentin (NEURONTIN) 100 MG capsule Take 1 capsule (100 mg total) by mouth 3 (three) times daily. (Patient not taking: Reported on 06/30/2015)  . potassium chloride (KLOR-CON 10) 10 MEQ tablet Take 1 tablet (10 mEq total) by mouth daily.   No facility-administered encounter medications on file as of 06/30/2015.    Activities of Daily Living No flowsheet data found.  Patient Care Team: Biagio Borg, MD as PCP - General    Assessment:      Exercise Activities and Dietary recommendations    Goals    . patient     Tennis Must stress and enjoy her life    . Weight < 200 lb (90.719 kg)     Walks a lot and prays./ get on machines;  Silver sneakers and the to the gym Mid-morning; is good/ may try zumba;        Fall Risk Fall Risk  06/30/2015 12/15/2014 06/29/2014 12/04/2013 09/18/2013  Falls in the past year? _0   Number falls in past yr: - - - - -  Injury with Fall? - - - - -   Depression Screen PHQ 2/9 Scores 06/30/2015 12/15/2014 06/29/2014 12/04/2013  PHQ - 2 Score 0 0 0 1     Cognitive Testing MMSE - Mini Mental  State Exam 06/29/2014  Not completed: Unable to complete   Ad8 score 0   Immunization History  Administered Date(s) Administered  . Influenza Split 11/21/2010, 12/05/2011  . Influenza Whole 12/06/2009  . Influenza, High Dose Seasonal PF 12/04/2012, 12/04/2013  . Influenza,inj,Quad PF,36+ Mos 12/15/2014  . Pneumococcal Conjugate-13 12/19/2012  . Pneumococcal Polysaccharide-23 11/27/2004, 12/06/2009  . Td 11/27/2004, 06/08/2014   Screening Tests Health Maintenance  Topic Date Due  . ZOSTAVAX  12/17/2004  . FOOT EXAM  09/19/2014  . PNA vac Low Risk Adult (2 of 2 - PPSV23) 12/07/2014  . HEMOGLOBIN A1C  12/08/2014  . OPHTHALMOLOGY EXAM  06/28/2015  . INFLUENZA VACCINE  09/27/2015  . MAMMOGRAM  02/18/2016  . COLONOSCOPY  01/17/2020  . TETANUS/TDAP  06/07/2024  . DEXA SCAN  Completed  . Hepatitis C Screening  Completed      Plan:   Stated shingles was to expensive; Educated to check under part D   Discussed referral to [facility] [place] for more information regarding nutritious diet and low sodium; Feels like her current level of stress is the reason she can't lose weight and will work on that fist. Will come back in to see Dr. Jenny Reichmann regarding her BP;   During the course of the visit the patient was educated and counseled about the following appropriate screening and preventive services:   Vaccines to include Pneumoccal, Influenza, Hepatitis B, Td, Zostavax, HCV/ discussed PCV 23 was taken approx 63 and prevnar 13 in 2014; Educated on CDC recommendation and agreed to see if Dr. Jenny Reichmann feels she needs to repeat. Deferred today due to stress;   Electrocardiogram/ 11/2011  Cardiovascular Disease/ BP elevated; understands the need to manage her stress; Given information regarding [place] if needed   Colorectal cancer screening- 12/2019  Bone density screening- 05/2008 no issues  Diabetes screening/ A1c 5.8  Glaucoma screening-Dr. Rankin's office  confirmed eye exam 01/2015 and is faxing over report; Apt set for  01/2016  Mammography/01/2014  Nutrition counseling / may benefit from Nutritional management; Feels lack of weight loss related to stress but given resources; discussed low sodium diet and sodium in canned food; processed food; and restaurants   To see Dr. Jenny Reichmann tomorrow for BP recheck and fup   Patient Instructions (the written plan) was given to the patient.   QNETU,YWSBB, RN  06/30/2015  Medical screening examination/treatment/procedure(s) were performed by non-physician practitioner and as supervising physician I was immediately available for consultation/collaboration. I agree with above. Cathlean Cower, MD

## 2015-06-30 NOTE — Patient Instructions (Addendum)
[name] , Thank you for taking time to come for your Medicare Wellness Visit. I appreciate your ongoing commitment to your health goals. Please review the following plan we discussed and let me know if I can assist you in the future.   Important Safety Information ZOSTAVAX does not protect everyone, so some people who get the vaccine may still get Shingles.  You should not get ZOSTAVAX if you are allergic to any of its ingredients, including gelatin or neomycin, have a weakened immune system, take high doses of steroids, or are pregnant or plan to become pregnant. You should not get ZOSTAVAX to prevent chickenpox.  Talk to your health care professional if you plan to get ZOSTAVAX at the same time as PNEUMOVAX23 (Pneumococcal Vaccine Polyvalent) because it may be better to get these vaccines at least 4 weeks apart.  Possible side effects include redness, pain, itching, swelling, hard lump, warmth, or bruising at the injection site, as well as headache.  ZOSTAVAX contains a weakened chickenpox virus. Tell your health care professional if you will be in close contact with newborn infants, someone who may be pregnant and has not had chickenpox or been vaccinated against chickenpox, or someone who has problems with their immune system. Your health care professional can tell you what situations you may need to avoid. You are encouraged to report negative side effects of prescription drugs to the FDA. Visit [employer], or call 1-800-FDA-1088.  Educated to check with insurance regarding coverage of Shingles vaccination on Part D or Part B and may have lower co-pay if provided on the Part D side  Can seek diet information from [facility]  Address: [address], [place], [place] [address]  Phone: [phone]  Will discuss another Pneumonia with Dr. Jenny Reichmann  Eating small of something in the am when you get up.     These are the goals we discussed: Goals     . patient     Tennis Must stress and enjoy her life    . Weight < 200 lb (90.719 kg)     Walks a lot and prays./ get on machines;  Silver sneakers and the to the gym Mid-morning; is good/ may try zumba;         This is a list of the screening recommended for you and due dates:  Health Maintenance  Topic Date Due  . Shingles Vaccine  12/17/2004  . Complete foot exam   09/19/2014  . Pneumonia vaccines (2 of 2 - PPSV23) 12/07/2014  . Hemoglobin A1C  12/08/2014  . Eye exam for diabetics  06/28/2015  . Flu Shot  09/27/2015  . Mammogram  02/18/2016  . Colon Cancer Screening  01/17/2020  . Tetanus Vaccine  06/07/2024  . DEXA scan (bone density measurement)  Completed  .  Hepatitis C: One time screening is recommended by Center for Disease Control  (CDC) for  adults born from [birth year] through 1966.   Completed    Stress and Stress Management Stress is a normal reaction to life events. It is what you feel when life demands more than you are used to or more than you can handle. Some stress can be useful. For example, the stress reaction can help you catch the last bus of the day, study for a test, or meet a deadline at work. But stress that occurs too often or for too long can cause problems. It can affect your emotional health and interfere with relationships and normal  daily activities. Too much stress can weaken your immune system and increase your risk for physical illness. If you already have a medical problem, stress can make it worse. CAUSES  All sorts of life events may cause stress. An event that causes stress for one person may not be stressful for another person. Major life events commonly cause stress. These may be positive or negative. Examples include losing your job, moving into a new home, getting married, having a baby, or losing a loved one. Less obvious life events may also cause stress, especially if they occur day after day or in combination. Examples include working long hours, driving  in traffic, caring for children, being in debt, or being in a difficult relationship. SIGNS AND SYMPTOMS Stress may cause emotional symptoms including, the following:  Anxiety. This is feeling worried, afraid, on edge, overwhelmed, or out of control.  Anger. This is feeling irritated or impatient.  Depression. This is feeling sad, down, helpless, or guilty.  Difficulty focusing, remembering, or making decisions. Stress may cause physical symptoms, including the following:   Aches and pains. These may affect your head, neck, back, stomach, or other areas of your body.  Tight muscles or clenched jaw.  Low energy or trouble sleeping. Stress may cause unhealthy behaviors, including the following:   Eating to feel better (overeating) or skipping meals.  Sleeping too little, too much, or both.  Working too much or putting off tasks (procrastination).  Smoking, drinking alcohol, or using drugs to feel better. DIAGNOSIS  Stress is diagnosed through an assessment by your health care provider. Your health care provider will ask questions about your symptoms and any stressful life events.Your health care provider will also ask about your medical history and may order blood tests or other tests. Certain medical conditions and medicine can cause physical symptoms similar to stress. Mental illness can cause emotional symptoms and unhealthy behaviors similar to stress. Your health care provider may refer you to a mental health professional for further evaluation.  TREATMENT  Stress management is the recommended treatment for stress.The goals of stress management are reducing stressful life events and coping with stress in healthy ways.  Techniques for reducing stressful life events include the following:  Stress identification. Self-monitor for stress and identify what causes stress for you. These skills may help you to avoid some stressful events.  Time management. Set your priorities, keep  a calendar of events, and learn to say "no." These tools can help you avoid making too many commitments. Techniques for coping with stress include the following:  Rethinking the problem. Try to think realistically about stressful events rather than ignoring them or overreacting. Try to find the positives in a stressful situation rather than focusing on the negatives.  Exercise. Physical exercise can release both physical and emotional tension. The key is to find a form of exercise you enjoy and do it regularly.  Relaxation techniques. These relax the body and mind. Examples include yoga, meditation, tai chi, biofeedback, deep breathing, progressive muscle relaxation, listening to music, being out in nature, journaling, and other hobbies. Again, the key is to find one or more that you enjoy and can do regularly.  Healthy lifestyle. Eat a balanced diet, get plenty of sleep, and do not smoke. Avoid using alcohol or drugs to relax.  Strong support network. Spend time with family, friends, or other people you enjoy being around.Express your feelings and talk things over with someone you trust. Counseling or talktherapy with a mental health  professional may be helpful if you are having difficulty managing stress on your own. Medicine is typically not recommended for the treatment of stress.Talk to your health care provider if you think you need medicine for symptoms of stress. HOME CARE INSTRUCTIONS  Keep all follow-up visits as directed by your health care provider.  Take all medicines as directed by your health care provider. SEEK MEDICAL CARE IF:  Your symptoms get worse or you start having new symptoms.  You feel overwhelmed by your problems and can no longer manage them on your own. SEEK IMMEDIATE MEDICAL CARE IF:  You feel like hurting yourself or someone else.   This information is not intended to replace advice given to you by your health care provider. Make sure you discuss any  questions you have with your health care provider.   Document Released: 08/08/2000 Document Revised: 03/05/2014 Document Reviewed: 10/07/2012 Elsevier Interactive Patient Education 2016 [place] in the Home  Falls can cause injuries. They can happen to people of all ages. There are many things you can do to make your home safe and to help prevent falls.  WHAT CAN I DO ON THE OUTSIDE OF MY HOME?  Regularly fix the edges of walkways and driveways and fix any cracks.  Remove anything that might make you trip as you walk through a door, such as a raised step or threshold.  Trim any bushes or trees on the path to your home.  Use bright outdoor lighting.  Clear any walking paths of anything that might make someone trip, such as rocks or tools.  Regularly check to see if handrails are loose or broken. Make sure that both sides of any steps have handrails.  Any raised decks and porches should have guardrails on the edges.  Have any leaves, snow, or ice cleared regularly.  Use sand or salt on walking paths during winter.  Clean up any spills in your garage right away. This includes oil or grease spills. WHAT CAN I DO IN THE BATHROOM?   Use night lights.  Install grab bars by the toilet and in the tub and shower. Do not use towel bars as grab bars.  Use non-skid mats or decals in the tub or shower.  If you need to sit down in the shower, use a plastic, non-slip stool.  Keep the floor dry. Clean up any water that spills on the floor as soon as it happens.  Remove soap buildup in the tub or shower regularly.  Attach bath mats securely with double-sided non-slip rug tape.  Do not have throw rugs and other things on the floor that can make you trip. WHAT CAN I DO IN THE BEDROOM?  Use night lights.  Make sure that you have a light by your bed that is easy to reach.  Do not use any sheets or blankets that are too big for your bed. They should not hang down  onto the floor.  Have a firm chair that has side arms. You can use this for support while you get dressed.  Do not have throw rugs and other things on the floor that can make you trip. WHAT CAN I DO IN THE KITCHEN?  Clean up any spills right away.  Avoid walking on wet floors.  Keep items that you use a lot in easy-to-reach places.  If you need to reach something above you, use a strong step stool that has a grab bar.  Keep electrical cords  out of the way.  Do not use floor polish or wax that makes floors slippery. If you must use wax, use non-skid floor wax.  Do not have throw rugs and other things on the floor that can make you trip. WHAT CAN I DO WITH MY STAIRS?  Do not leave any items on the stairs.  Make sure that there are handrails on both sides of the stairs and use them. Fix handrails that are broken or loose. Make sure that handrails are as long as the stairways.  Check any carpeting to make sure that it is firmly attached to the stairs. Fix any carpet that is loose or worn.  Avoid having throw rugs at the top or bottom of the stairs. If you do have throw rugs, attach them to the floor with carpet tape.  Make sure that you have a light switch at the top of the stairs and the bottom of the stairs. If you do not have them, ask someone to add them for you. WHAT ELSE CAN I DO TO HELP PREVENT FALLS?  Wear shoes that:  Do not have high heels.  Have rubber bottoms.  Are comfortable and fit you well.  Are closed at the toe. Do not wear sandals.  If you use a stepladder:  Make sure that it is fully opened. Do not climb a closed stepladder.  Make sure that both sides of the stepladder are locked into place.  Ask someone to hold it for you, if possible.  Clearly mark and make sure that you can see:  Any grab bars or handrails.  First and last steps.  Where the edge of each step is.  Use tools that help you move around (mobility aids) if they are needed. These  include:  Canes.  Walkers.  Scooters.  Crutches.  Turn on the lights when you go into a dark area. Replace any light bulbs as soon as they burn out.  Set up your furniture so you have a clear path. Avoid moving your furniture around.  If any of your floors are uneven, fix them.  If there are any pets around you, be aware of where they are.  Review your medicines with your doctor. Some medicines can make you feel dizzy. This can increase your chance of falling. Ask your doctor what other things that you can do to help prevent falls.   This information is not intended to replace advice given to you by your health care provider. Make sure you discuss any questions you have with your health care provider.   Document Released: 12/09/2008 Document Revised: 06/29/2014 Document Reviewed: 03/19/2014 Elsevier Interactive Patient Education 2016 [place] Maintenance, Female Adopting a healthy lifestyle and getting preventive care can go a long way to promote health and wellness. Talk with your health care provider about what schedule of regular examinations is right for you. This is a good chance for you to check in with your provider about disease prevention and staying healthy. In between checkups, there are plenty of things you can do on your own. Experts have done a lot of research about which lifestyle changes and preventive measures are most likely to keep you healthy. Ask your health care provider for more information. WEIGHT AND DIET  Eat a healthy diet  Be sure to include plenty of vegetables, fruits, low-fat dairy products, and lean protein.  Do not eat a lot of foods high in solid fats, added sugars, or salt.  Get regular  exercise. This is one of the most important things you can do for your health.  Most adults should exercise for at least 150 minutes each week. The exercise should increase your heart rate and make you sweat (moderate-intensity exercise).  Most  adults should also do strengthening exercises at least twice a week. This is in addition to the moderate-intensity exercise.  Maintain a healthy weight  Body mass index (BMI) is a measurement that can be used to identify possible weight problems. It estimates body fat based on height and weight. Your health care provider can help determine your BMI and help you achieve or maintain a healthy weight.  For females 25 years of age and older:   A BMI below 18.5 is considered underweight.  A BMI of 18.5 to 24.9 is normal.  A BMI of 25 to 29.9 is considered overweight.  A BMI of 30 and above is considered obese.  Watch levels of cholesterol and blood lipids  You should start having your blood tested for lipids and cholesterol at 71 years of age, then have this test every 5 years.  You may need to have your cholesterol levels checked more often if:  Your lipid or cholesterol levels are high.  You are older than 71 years of age.  You are at high risk for heart disease.  CANCER SCREENING   Lung Cancer  Lung cancer screening is recommended for adults 20-51 years old who are at high risk for lung cancer because of a history of smoking.  A yearly low-dose CT scan of the lungs is recommended for people who:  Currently smoke.  Have quit within the past 15 years.  Have at least a 30-pack-year history of smoking. A pack year is smoking an average of one pack of cigarettes a day for 1 year.  Yearly screening should continue until it has been 15 years since you quit.  Yearly screening should stop if you develop a health problem that would prevent you from having lung cancer treatment.  Breast Cancer  Practice breast self-awareness. This means understanding how your breasts normally appear and feel.  It also means doing regular breast self-exams. Let your health care provider know about any changes, no matter how small.  If you are in your 20s or 30s, you should have a clinical  breast exam (CBE) by a health care provider every 1-3 years as part of a regular health exam.  If you are 52 or older, have a CBE every year. Also consider having a breast X-ray (mammogram) every year.  If you have a family history of breast cancer, talk to your health care provider about genetic screening.  If you are at high risk for breast cancer, talk to your health care provider about having an MRI and a mammogram every year.  Breast cancer gene (BRCA) assessment is recommended for women who have family members with BRCA-related cancers. BRCA-related cancers include:  Breast.  Ovarian.  Tubal.  Peritoneal cancers.  Results of the assessment will determine the need for genetic counseling and BRCA1 and BRCA2 testing. Cervical Cancer Your health care provider may recommend that you be screened regularly for cancer of the pelvic organs (ovaries, uterus, and vagina). This screening involves a pelvic examination, including checking for microscopic changes to the surface of your cervix (Pap test). You may be encouraged to have this screening done every 3 years, beginning at age 37.  For women ages 58-65, health care providers may recommend pelvic exams and Pap  testing every 3 years, or they may recommend the Pap and pelvic exam, combined with testing for human papilloma virus (HPV), every 5 years. Some types of HPV increase your risk of cervical cancer. Testing for HPV may also be done on women of any age with unclear Pap test results.  Other health care providers may not recommend any screening for nonpregnant women who are considered low risk for pelvic cancer and who do not have symptoms. Ask your health care provider if a screening pelvic exam is right for you.  If you have had past treatment for cervical cancer or a condition that could lead to cancer, you need Pap tests and screening for cancer for at least 20 years after your treatment. If Pap tests have been discontinued, your risk  factors (such as having a new sexual partner) need to be reassessed to determine if screening should resume. Some women have medical problems that increase the chance of getting cervical cancer. In these cases, your health care provider may recommend more frequent screening and Pap tests. Colorectal Cancer  This type of cancer can be detected and often prevented.  Routine colorectal cancer screening usually begins at 71 years of age and continues through 71 years of age.  Your health care provider may recommend screening at an earlier age if you have risk factors for colon cancer.  Your health care provider may also recommend using home test kits to check for hidden blood in the stool.  A small camera at the end of a tube can be used to examine your colon directly (sigmoidoscopy or colonoscopy). This is done to check for the earliest forms of colorectal cancer.  Routine screening usually begins at age 34.  Direct examination of the colon should be repeated every 5-10 years through 70 years of age. However, you may need to be screened more often if early forms of precancerous polyps or small growths are found. Skin Cancer  Check your skin from head to toe regularly.  Tell your health care provider about any new moles or changes in moles, especially if there is a change in a mole's shape or color.  Also tell your health care provider if you have a mole that is larger than the size of a pencil eraser.  Always use sunscreen. Apply sunscreen liberally and repeatedly throughout the day.  Protect yourself by wearing long sleeves, pants, a wide-brimmed hat, and sunglasses whenever you are outside. HEART DISEASE, DIABETES, AND HIGH BLOOD PRESSURE   High blood pressure causes heart disease and increases the risk of stroke. High blood pressure is more likely to develop in:  People who have blood pressure in the high end of the normal range (130-139/85-89 mm Hg).  People who are overweight or  obese.  People who are African American.  If you are 21-23 years of age, have your blood pressure checked every 3-5 years. If you are 58 years of age or older, have your blood pressure checked every year. You should have your blood pressure measured twice--once when you are at a hospital or clinic, and once when you are not at a hospital or clinic. Record the average of the two measurements. To check your blood pressure when you are not at a hospital or clinic, you can use:  An automated blood pressure machine at a pharmacy.  A home blood pressure monitor.  If you are between 72 years and 53 years old, ask your health care provider if you should take aspirin to  prevent strokes.  Have regular diabetes screenings. This involves taking a blood sample to check your fasting blood sugar level.  If you are at a normal weight and have a low risk for diabetes, have this test once every three years after 71 years of age.  If you are overweight and have a high risk for diabetes, consider being tested at a younger age or more often. PREVENTING INFECTION  Hepatitis B  If you have a higher risk for hepatitis B, you should be screened for this virus. You are considered at high risk for hepatitis B if:  You were born in a country where hepatitis B is common. Ask your health care provider which countries are considered high risk.  Your parents were born in a high-risk country, and you have not been immunized against hepatitis B (hepatitis B vaccine).  You have HIV or AIDS.  You use needles to inject street drugs.  You live with someone who has hepatitis B.  You have had sex with someone who has hepatitis B.  You get hemodialysis treatment.  You take certain medicines for conditions, including cancer, organ transplantation, and autoimmune conditions. Hepatitis C  Blood testing is recommended for:  Everyone born from [birth year] through 1966.  Anyone with known risk factors for hepatitis  C. Sexually transmitted infections (STIs)  You should be screened for sexually transmitted infections (STIs) including gonorrhea and chlamydia if:  You are sexually active and are younger than 71 years of age.  You are older than 71 years of age and your health care provider tells you that you are at risk for this type of infection.  Your sexual activity has changed since you were last screened and you are at an increased risk for chlamydia or gonorrhea. Ask your health care provider if you are at risk.  If you do not have HIV, but are at risk, it may be recommended that you take a prescription medicine daily to prevent HIV infection. This is called pre-exposure prophylaxis (PrEP). You are considered at risk if:  You are sexually active and do not regularly use condoms or know the HIV status of your partner(s).  You take drugs by injection.  You are sexually active with a partner who has HIV. Talk with your health care provider about whether you are at high risk of being infected with HIV. If you choose to begin PrEP, you should first be tested for HIV. You should then be tested every 3 months for as long as you are taking PrEP.  PREGNANCY   If you are premenopausal and you may become pregnant, ask your health care provider about preconception counseling.  If you may become pregnant, take 400 to 800 micrograms (mcg) of folic acid every day.  If you want to prevent pregnancy, talk to your health care provider about birth control (contraception). OSTEOPOROSIS AND MENOPAUSE   Osteoporosis is a disease in which the bones lose minerals and strength with aging. This can result in serious bone fractures. Your risk for osteoporosis can be identified using a bone density scan.  If you are 86 years of age or older, or if you are at risk for osteoporosis and fractures, ask your health care provider if you should be screened.  Ask your health care provider whether you should take a calcium or  vitamin D supplement to lower your risk for osteoporosis.  Menopause may have certain physical symptoms and risks.  Hormone replacement therapy may reduce some of these symptoms and  risks. Talk to your health care provider about whether hormone replacement therapy is right for you.  HOME CARE INSTRUCTIONS   Schedule regular health, dental, and eye exams.  Stay current with your immunizations.   Do not use any tobacco products including cigarettes, chewing tobacco, or electronic cigarettes.  If you are pregnant, do not drink alcohol.  If you are breastfeeding, limit how much and how often you drink alcohol.  Limit alcohol intake to no more than 1 drink per day for nonpregnant women. One drink equals 12 ounces of beer, 5 ounces of wine, or 1 ounces of hard liquor.  Do not use street drugs.  Do not share needles.  Ask your health care provider for help if you need support or information about quitting drugs.  Tell your health care provider if you often feel depressed.  Tell your health care provider if you have ever been abused or do not feel safe at home.   This information is not intended to replace advice given to you by your health care provider. Make sure you discuss any questions you have with your health care provider.   Document Released: 08/28/2010 Document Revised: 03/05/2014 Document Reviewed: 01/14/2013 Elsevier Interactive Patient Education [employer].

## 2015-07-01 ENCOUNTER — Encounter: Payer: Self-pay | Admitting: Internal Medicine

## 2015-07-01 ENCOUNTER — Other Ambulatory Visit (INDEPENDENT_AMBULATORY_CARE_PROVIDER_SITE_OTHER): Payer: Medicare Other

## 2015-07-01 ENCOUNTER — Other Ambulatory Visit: Payer: Self-pay | Admitting: Internal Medicine

## 2015-07-01 ENCOUNTER — Ambulatory Visit (INDEPENDENT_AMBULATORY_CARE_PROVIDER_SITE_OTHER): Payer: Medicare Other | Admitting: Internal Medicine

## 2015-07-01 VITALS — BP 142/98 | HR 78 | Resp 20 | Wt 194.0 lb

## 2015-07-01 DIAGNOSIS — R6889 Other general symptoms and signs: Secondary | ICD-10-CM | POA: Diagnosis not present

## 2015-07-01 DIAGNOSIS — Z0001 Encounter for general adult medical examination with abnormal findings: Secondary | ICD-10-CM | POA: Diagnosis not present

## 2015-07-01 DIAGNOSIS — I10 Essential (primary) hypertension: Secondary | ICD-10-CM

## 2015-07-01 DIAGNOSIS — E785 Hyperlipidemia, unspecified: Secondary | ICD-10-CM | POA: Diagnosis not present

## 2015-07-01 DIAGNOSIS — F411 Generalized anxiety disorder: Secondary | ICD-10-CM | POA: Insufficient documentation

## 2015-07-01 DIAGNOSIS — E119 Type 2 diabetes mellitus without complications: Secondary | ICD-10-CM | POA: Diagnosis not present

## 2015-07-01 DIAGNOSIS — F418 Other specified anxiety disorders: Secondary | ICD-10-CM | POA: Insufficient documentation

## 2015-07-01 DIAGNOSIS — M19172 Post-traumatic osteoarthritis, left ankle and foot: Secondary | ICD-10-CM

## 2015-07-01 HISTORY — DX: Generalized anxiety disorder: F41.1

## 2015-07-01 LAB — HEPATIC FUNCTION PANEL
ALT: 14 U/L (ref 0–35)
AST: 18 U/L (ref 0–37)
Albumin: 4.2 g/dL (ref 3.5–5.2)
Alkaline Phosphatase: 115 U/L (ref 39–117)
Bilirubin, Direct: 0.1 mg/dL (ref 0.0–0.3)
Total Bilirubin: 0.7 mg/dL (ref 0.2–1.2)
Total Protein: 7.1 g/dL (ref 6.0–8.3)

## 2015-07-01 LAB — MICROALBUMIN / CREATININE URINE RATIO
Creatinine,U: 43.4 mg/dL
Microalb Creat Ratio: 0.5 mg/g (ref 0.0–30.0)
Microalb, Ur: 0.2 mg/dL (ref 0.0–1.9)

## 2015-07-01 LAB — CBC WITH DIFFERENTIAL/PLATELET
Basophils Absolute: 0.1 10*3/uL (ref 0.0–0.1)
Basophils Relative: 1 % (ref 0.0–3.0)
Eosinophils Absolute: 0.2 10*3/uL (ref 0.0–0.7)
Eosinophils Relative: 2.7 % (ref 0.0–5.0)
HCT: 39 % (ref 36.0–46.0)
Hemoglobin: 13.1 g/dL (ref 12.0–15.0)
Lymphocytes Relative: 48.7 % — ABNORMAL HIGH (ref 12.0–46.0)
Lymphs Abs: 3.1 10*3/uL (ref 0.7–4.0)
MCHC: 33.7 g/dL (ref 30.0–36.0)
MCV: 89.7 fl (ref 78.0–100.0)
Monocytes Absolute: 0.5 10*3/uL (ref 0.1–1.0)
Monocytes Relative: 8.7 % (ref 3.0–12.0)
Neutro Abs: 2.5 10*3/uL (ref 1.4–7.7)
Neutrophils Relative %: 38.9 % — ABNORMAL LOW (ref 43.0–77.0)
Platelets: 265 10*3/uL (ref 150.0–400.0)
RBC: 4.34 Mil/uL (ref 3.87–5.11)
RDW: 14.6 % (ref 11.5–15.5)
WBC: 6.3 10*3/uL (ref 4.0–10.5)

## 2015-07-01 LAB — BASIC METABOLIC PANEL
BUN: 18 mg/dL (ref 6–23)
CO2: 30 mEq/L (ref 19–32)
Calcium: 9.6 mg/dL (ref 8.4–10.5)
Chloride: 104 mEq/L (ref 96–112)
Creatinine, Ser: 0.92 mg/dL (ref 0.40–1.20)
GFR: 77.49 mL/min (ref >60.00)
Glucose, Bld: 77 mg/dL (ref 70–99)
Potassium: 4 mEq/L (ref 3.5–5.1)
Sodium: 141 mEq/L (ref 135–145)

## 2015-07-01 LAB — HEMOGLOBIN A1C: Hgb A1c MFr Bld: 6 % (ref 4.6–6.5)

## 2015-07-01 LAB — URINALYSIS, ROUTINE W REFLEX MICROSCOPIC
Bilirubin Urine: NEGATIVE
Hgb urine dipstick: NEGATIVE
Ketones, ur: NEGATIVE
Nitrite: NEGATIVE
Specific Gravity, Urine: <=1.005 — AB (ref 1.000–1.030)
Total Protein, Urine: NEGATIVE
Urine Glucose: NEGATIVE
Urobilinogen, UA: 0.2 (ref 0.0–1.0)
pH: 6.5 (ref 5.0–8.0)

## 2015-07-01 LAB — LIPID PANEL
Cholesterol: 201 mg/dL — ABNORMAL HIGH (ref 0–200)
HDL: 66.4 mg/dL (ref >39.00)
LDL Cholesterol: 121 mg/dL — ABNORMAL HIGH (ref 0–99)
NonHDL: 134.58
Total CHOL/HDL Ratio: 3
Triglycerides: 69 mg/dL (ref 0.0–149.0)
VLDL: 13.8 mg/dL (ref 0.0–40.0)

## 2015-07-01 LAB — TSH: TSH: 1.59 u[IU]/mL (ref 0.35–4.50)

## 2015-07-01 MED ORDER — GLUCOSE BLOOD VI STRP: 1.0000 | ORAL_STRIP | Freq: Two times a day (BID) | Status: DC

## 2015-07-01 MED ORDER — ALBUTEROL SULFATE HFA 108 (90 BASE) MCG/ACT IN AERS: INHALATION_SPRAY | RESPIRATORY_TRACT | Status: DC

## 2015-07-01 MED ORDER — ONETOUCH DELICA LANCETS 33G MISC: 1.0000 | Freq: Two times a day (BID) | Status: DC

## 2015-07-01 MED ORDER — CITALOPRAM HYDROBROMIDE 10 MG PO TABS: 10.0000 mg | ORAL_TABLET | Freq: Every day | ORAL | Status: DC

## 2015-07-01 MED ORDER — PIOGLITAZONE HCL 15 MG PO TABS: 15.0000 mg | ORAL_TABLET | Freq: Every day | ORAL | Status: DC

## 2015-07-01 MED ORDER — AMLODIPINE BESYLATE 10 MG PO TABS: 10.0000 mg | ORAL_TABLET | Freq: Every day | ORAL | Status: DC

## 2015-07-01 MED ORDER — POTASSIUM CHLORIDE ER 10 MEQ PO TBCR: 10.0000 meq | EXTENDED_RELEASE_TABLET | Freq: Every day | ORAL | Status: DC

## 2015-07-01 MED ORDER — IBUPROFEN 400 MG PO TABS: ORAL_TABLET | ORAL | Status: DC

## 2015-07-01 MED ORDER — ROSUVASTATIN CALCIUM 10 MG PO TABS: 10.0000 mg | ORAL_TABLET | Freq: Every day | ORAL | Status: DC

## 2015-07-01 MED ORDER — IRBESARTAN-HYDROCHLOROTHIAZIDE 300-12.5 MG PO TABS: 1.0000 | ORAL_TABLET | Freq: Every day | ORAL | Status: DC

## 2015-07-01 NOTE — Progress Notes (Signed)
Pre visit review using our clinic review tool, if applicable. No additional management support is needed unless otherwise documented below in the visit note. 

## 2015-07-01 NOTE — Patient Instructions (Signed)
Ok to increase the Norvasc to 10 mg per day  Please take all new medication as prescribed - the citalopram 10 mg per day for stress  Please continue all other medications as before, and refills have been done if requested - the ibuprofen and all the other meds listed today  Please have the pharmacy call with any other refills you may need.  Please continue your efforts at being more active, low cholesterol diet, and weight control.  You are otherwise up to date with prevention measures today.  Please keep your appointments with your specialists as you may have planned  Please go to the LAB in the Basement (turn left off the elevator) for the tests to be done today  You will be contacted by phone if any changes need to be made immediately.  Otherwise, you will receive a letter about your results with an explanation, but please check with MyChart first.  Please remember to sign up for MyChart if you have not done so, as this will be important to you in the future with finding out test results, communicating by private email, and scheduling acute appointments online when needed.  Please return in 6 months, or sooner if needed, with Lab testing done 3-5 days before

## 2015-07-01 NOTE — Progress Notes (Signed)
Subjective:    Patient ID: Brandi Shaffer, female    DOB: November 17, 1945, 70 y.o.   MRN: [id number]  HPI  Here for wellness and f/u;  Overall doing ok;  Pt denies Chest pain, worsening SOB, DOE, wheezing, orthopnea, PND, worsening LE edema, palpitations, dizziness or syncope.  Pt denies neurological change such as new headache, facial or extremity weakness.  Pt denies polydipsia, polyuria, or low sugar symptoms. Pt states overall good compliance with treatment and medications, good tolerability, and has been trying to follow appropriate diet.  Pt denies worsening depressive symptoms, suicidal ideation or panic, but has worsening overall stress with several social stressors, seems to be irritable and fly off the handle fairly frequently. No fever, night sweats, wt loss, loss of appetite, or other constitutional symptoms.  Pt states good ability with ADL's, has low fall risk, home safety reviewed and adequate, no other significant changes in hearing or vision, and only occasionally active with exercise. BP at home wrist cuff mild elevated.  Chronic arthritic pain stable on ibuprofen.  Trying to follow DM diet, but cannot always do well Past Medical History  Diagnosis Date  . ANEMIA-IRON DEFICIENCY 12/06/2009  . DIABETES MELLITUS, TYPE II 09/17/2006  . DIVERTICULOSIS, COLON 12/06/2009  . GENITAL HERPES 03/24/2010  . HYPERLIPIDEMIA 03/24/2010  . HYPERTENSION 09/17/2006  . HYPOTHYROIDISM 09/17/2006  . LIPOMA 12/06/2009  . PERIPHERAL EDEMA 03/24/2010  . GERD (gastroesophageal reflux disease) 06/28/2011  . Renal cyst 07/05/2011    1.9 cm minimally complex  . Asthma 12/05/2011  . Anxiety state 07/01/2015   Past Surgical History  Procedure Laterality Date  . Tubal ligation    . Abdominal hysterectomy      fibroids  . Ankle surgury      x 3 left - chronic pain/swelling  . Tonsillectomy    . Shoulder surgery Left     reports that she has never smoked. She does not have any smokeless tobacco history on file.  She reports that she does not drink alcohol or use illicit drugs. family history includes Diabetes in her other; Heart disease in her sister; Hypertension in her father and mother; Joint hypermobility in her other; Prostate cancer in her brother; Stroke in her sister. Allergies  Allergen Reactions  . Januvia [Sitagliptin Phosphate]     dizziness  . Metformin And Related Nausea Only  . Doxycycline Rash   Current Outpatient Prescriptions on File Prior to Visit  Medication Sig Dispense Refill  . aspirin 81 MG EC tablet TAKE 1 TABLET BY MOUTH DAILY. SWALLOW WHOLE. 30 tablet 11  . Cholecalciferol (VITAMIN D) 400 UNITS tablet Take 400 Units by mouth daily.      . Coenzyme Q10 (COQ10) 100 MG capsule Take 100 mg by mouth daily.      Marland Kitchen ipratropium-albuterol (DUONEB) 0.5-2.5 (3) MG/3ML SOLN Take 3 mLs by nebulization every 6 (six) hours as needed. 360 mL 5   No current facility-administered medications on file prior to visit.      Review of Systems Constitutional: Negative for increased diaphoresis, or other activity, appetite or siginficant weight change other than noted HENT: Negative for worsening hearing loss, ear pain, facial swelling, mouth sores and neck stiffness.   Eyes: Negative for other worsening pain, redness or visual disturbance.  Respiratory: Negative for choking or stridor Cardiovascular: Negative for other chest pain and palpitations.  Gastrointestinal: Negative for worsening diarrhea, blood in stool, or abdominal distention Genitourinary: Negative for hematuria, flank pain or change in urine volume.  Musculoskeletal: Negative for myalgias or other joint complaints.  Skin: Negative for other color change and wound or drainage.  Neurological: Negative for syncope and numbness. other than noted Hematological: Negative for adenopathy. or other swelling Psychiatric/Behavioral: Negative for hallucinations, SI, self-injury, decreased concentration or other worsening agitation.        Objective:   Physical Exam BP 142/98 mmHg  Pulse 78  Resp 20  Wt 194 lb (87.998 kg)  SpO2 94% VS noted, not ill appearing Constitutional: Pt is oriented to person, place, and time. Appears well-developed and well-nourished, in no significant distress Head: Normocephalic and atraumatic  Eyes: Conjunctivae and EOM are normal. Pupils are equal, round, and reactive to light Right Ear: External ear normal.  Left Ear: External ear normal Nose: Nose normal.  Mouth/Throat: Oropharynx is clear and moist  Neck: Normal range of motion. Neck supple. No JVD present. No tracheal deviation present or significant neck LA or mass Cardiovascular: Normal rate, regular rhythm, normal heart sounds and intact distal pulses.   Pulmonary/Chest: Effort normal and breath sounds without rales or wheezing  Abdominal: Soft. Bowel sounds are normal. NT. No HSM  Musculoskeletal: Normal range of motion. Exhibits no edema Lymphadenopathy: Has no cervical adenopathy.  Neurological: Pt is alert and oriented to person, place, and time. Pt has normal reflexes. No cranial nerve deficit. Motor grossly intact Skin: Skin is warm and dry. No rash noted or new ulcers Psychiatric:  Has nervous mood and affect. Behavior is normal.     Assessment & Plan:

## 2015-07-02 NOTE — Assessment & Plan Note (Signed)
stable overall by history and exam, recent data reviewed with pt, and pt to continue medical treatment as before,  to f/u any worsening symptoms or concerns Lab Results  Component Value Date   HGBA1C 6.0 07/01/2015

## 2015-07-02 NOTE — Assessment & Plan Note (Addendum)
Mild to mod worsening, for celexa 10 qd,  to f/u any worsening symptoms or concerns  In addition to the time spent performing CPE, I spent an additional 40 minutes face to face,in which greater than 50% of this time was spent in counseling and coordination of care for patient's acute illness as documented.

## 2015-07-02 NOTE — Assessment & Plan Note (Signed)
stable overall by history and exam, recent data reviewed with pt, and pt to continue medical treatment as before,  to f/u any worsening symptoms or concerns Lab Results  Component Value Date   LDLCALC 121* 07/01/2015

## 2015-07-02 NOTE — Assessment & Plan Note (Signed)
stable overall by history and exam, recent data reviewed with pt, and pt to continue medical treatment as before,  to f/u any worsening symptoms or concerns BP Readings from Last 3 Encounters:  07/01/15 142/98  06/30/15 180/110  05/16/15 128/70

## 2015-07-02 NOTE — Assessment & Plan Note (Signed)
stable overall by history and exam, and pt to continue medical treatment as before,  to f/u any worsening symptoms or concerns 

## 2015-07-02 NOTE — Assessment & Plan Note (Signed)

## 2015-07-19 ENCOUNTER — Other Ambulatory Visit: Payer: Self-pay | Admitting: Internal Medicine

## 2015-07-25 ENCOUNTER — Other Ambulatory Visit: Payer: Self-pay | Admitting: Internal Medicine

## 2015-07-31 ENCOUNTER — Emergency Department (HOSPITAL_COMMUNITY)
Admission: EM | Admit: 2015-07-31 | Discharge: 2015-07-31 | Disposition: A | Discharge status: Home / Self Care | Payer: Medicare Other | Attending: Emergency Medicine | Admitting: Emergency Medicine

## 2015-07-31 ENCOUNTER — Encounter (HOSPITAL_COMMUNITY): Payer: Self-pay | Admitting: Emergency Medicine

## 2015-07-31 ENCOUNTER — Emergency Department (HOSPITAL_COMMUNITY): Payer: Medicare Other

## 2015-07-31 DIAGNOSIS — E119 Type 2 diabetes mellitus without complications: Secondary | ICD-10-CM | POA: Diagnosis not present

## 2015-07-31 DIAGNOSIS — Z79899 Other long term (current) drug therapy: Secondary | ICD-10-CM | POA: Diagnosis not present

## 2015-07-31 DIAGNOSIS — Y999 Unspecified external cause status: Secondary | ICD-10-CM | POA: Insufficient documentation

## 2015-07-31 DIAGNOSIS — Y929 Unspecified place or not applicable: Secondary | ICD-10-CM | POA: Insufficient documentation

## 2015-07-31 DIAGNOSIS — E039 Hypothyroidism, unspecified: Secondary | ICD-10-CM | POA: Insufficient documentation

## 2015-07-31 DIAGNOSIS — Y9301 Activity, walking, marching and hiking: Secondary | ICD-10-CM | POA: Diagnosis not present

## 2015-07-31 DIAGNOSIS — IMO0001 Reserved for inherently not codable concepts without codable children: Secondary | ICD-10-CM

## 2015-07-31 DIAGNOSIS — S4991XA Unspecified injury of right shoulder and upper arm, initial encounter: Secondary | ICD-10-CM | POA: Diagnosis present

## 2015-07-31 DIAGNOSIS — Z7982 Long term (current) use of aspirin: Secondary | ICD-10-CM | POA: Insufficient documentation

## 2015-07-31 DIAGNOSIS — J45909 Unspecified asthma, uncomplicated: Secondary | ICD-10-CM | POA: Diagnosis not present

## 2015-07-31 DIAGNOSIS — Z791 Long term (current) use of non-steroidal anti-inflammatories (NSAID): Secondary | ICD-10-CM | POA: Diagnosis not present

## 2015-07-31 DIAGNOSIS — S43004A Unspecified dislocation of right shoulder joint, initial encounter: Secondary | ICD-10-CM | POA: Insufficient documentation

## 2015-07-31 DIAGNOSIS — W010XXA Fall on same level from slipping, tripping and stumbling without subsequent striking against object, initial encounter: Secondary | ICD-10-CM | POA: Insufficient documentation

## 2015-07-31 DIAGNOSIS — E785 Hyperlipidemia, unspecified: Secondary | ICD-10-CM | POA: Diagnosis not present

## 2015-07-31 DIAGNOSIS — Z7984 Long term (current) use of oral hypoglycemic drugs: Secondary | ICD-10-CM | POA: Insufficient documentation

## 2015-07-31 DIAGNOSIS — I10 Essential (primary) hypertension: Secondary | ICD-10-CM | POA: Insufficient documentation

## 2015-07-31 DIAGNOSIS — S43014A Anterior dislocation of right humerus, initial encounter: Secondary | ICD-10-CM | POA: Diagnosis not present

## 2015-07-31 MED ORDER — FENTANYL CITRATE (PF) 100 MCG/2ML IJ SOLN
100.0000 ug | Freq: Once | INTRAMUSCULAR | Status: AC
  Administered 2015-07-31: 100 ug via INTRAVENOUS
  Filled 2015-07-31: qty 2

## 2015-07-31 MED ORDER — MIDAZOLAM HCL 2 MG/2ML IJ SOLN
2.0000 mg | Freq: Once | INTRAMUSCULAR | Status: AC
  Administered 2015-07-31: 2 mg via INTRAVENOUS
  Filled 2015-07-31: qty 2

## 2015-07-31 MED ORDER — TRAMADOL HCL 50 MG PO TABS
50.0000 mg | ORAL_TABLET | Freq: Once | ORAL | Status: AC
  Administered 2015-07-31: 50 mg via ORAL
  Filled 2015-07-31: qty 1

## 2015-07-31 NOTE — ED Notes (Signed)
Pt reports tripping this morning and falling to the floor.  C/o pain in right arm from shoulder down.

## 2015-07-31 NOTE — Sedation Documentation (Signed)
Pt. Shoulder not in place on portable xray. EDP pfieffer and cartner at bedside readjusting shoulder at this time. Pt. Still sedated from previous medications. RRT at bedside with RN.

## 2015-07-31 NOTE — ED Provider Notes (Signed)
CSN: [id number]     Arrival date & time 07/31/15  [id number] History   First MD Initiated Contact with Patient 07/31/15 [phone]     Chief Complaint  Patient presents with  . Fall     (Consider location/radiation/quality/duration/timing/severity/associated sxs/prior Treatment) HPI Brandi Shaffer is a 70 y.o. female who comes in to the ED for evaluation of her fall. Patient reports just prior to arrival, she was walking into her house, with the lights off, slipped on the wooden floor and fell forward onto her right arm. She denies any head injury, loss of consciousness, nausea or vomiting, vision changes. She does report acute onset right shoulder pain and decreased range of motion due to the pain. She has not taken anything to improve her symptoms. Certain movements worsen her discomfort. She denies any numbness or weakness. No anticoagulation.  Past Medical History  Diagnosis Date  . ANEMIA-IRON DEFICIENCY 12/06/2009  . DIABETES MELLITUS, TYPE II 09/17/2006  . DIVERTICULOSIS, COLON 12/06/2009  . GENITAL HERPES 03/24/2010  . HYPERLIPIDEMIA 03/24/2010  . HYPERTENSION 09/17/2006  . HYPOTHYROIDISM 09/17/2006  . LIPOMA 12/06/2009  . PERIPHERAL EDEMA 03/24/2010  . GERD (gastroesophageal reflux disease) 06/28/2011  . Renal cyst 07/05/2011    1.9 cm minimally complex  . Asthma 12/05/2011  . Anxiety state 07/01/2015   Past Surgical History  Procedure Laterality Date  . Tubal ligation    . Abdominal hysterectomy      fibroids  . Ankle surgury      x 3 left - chronic pain/swelling  . Tonsillectomy    . Shoulder surgery Left    Family History  Problem Relation Age of Onset  . Hypertension Mother   . Hypertension Father   . Heart disease Sister     sudden death early 63's  . Stroke Sister   . Prostate cancer Brother     cancer  . Diabetes Other   . Joint hypermobility Other     DJD   Social History  Substance Use Topics  . Smoking status: Never Smoker   . Smokeless tobacco: None  . Alcohol Use:  No   OB History    No data available     Review of Systems A 10 point review of systems was completed and was negative except for pertinent positives and negatives as mentioned in the history of present illness     Allergies  Januvia; Metformin and related; and Doxycycline  Home Medications   Prior to Admission medications   Medication Sig Start Date End Date Taking? Authorizing Provider  albuterol (PROAIR HFA) 108 (90 Base) MCG/ACT inhaler INHALE 2 PUFFS INTO THE LUNGS EVERY 6 (SIX) HOURS AS NEEDED FOR WHEEZING. 07/01/15  Yes Biagio Borg, MD  amLODipine (NORVASC) 10 MG tablet Take 1 tablet (10 mg total) by mouth daily. 07/01/15  Yes Biagio Borg, MD  aspirin 81 MG EC tablet TAKE 1 TABLET BY MOUTH DAILY. SWALLOW WHOLE. 03/05/14  Yes Biagio Borg, MD  Cholecalciferol (VITAMIN D) 400 UNITS tablet Take 400 Units by mouth daily.     Yes Historical Provider, MD  Coenzyme Q10 (COQ10) 100 MG capsule Take 100 mg by mouth daily.     Yes Historical Provider, MD  glucose blood test strip 1 each by Other route 2 (two) times daily. Dx E11.9 07/01/15  Yes Biagio Borg, MD  ibuprofen (ADVIL,MOTRIN) 400 MG tablet TAKE 1 TABLET (400 MG TOTAL) BY MOUTH EVERY 6 (SIX) HOURS AS NEEDED FOR PAIN. 07/01/15  Yes  Biagio Borg, MD  ipratropium-albuterol (DUONEB) 0.5-2.5 (3) MG/3ML SOLN USE 1 VIAL VIA NEBULIZER EVERY 6 HOURS AS NEEDED 07/26/15  Yes Biagio Borg, MD  irbesartan-hydrochlorothiazide (AVALIDE) 300-12.5 MG tablet Take 1 tablet by mouth daily. 07/01/15  Yes Biagio Borg, MD  [facility] DELICA LANCETS [id number] MISC 1 each by Does not apply route 2 (two) times daily. Use to help check blood sugars twice a day Dx E11.9 07/01/15  Yes Biagio Borg, MD  pioglitazone (ACTOS) 15 MG tablet Take 1 tablet (15 mg total) by mouth daily. 07/01/15  Yes Biagio Borg, MD  potassium chloride (KLOR-CON 10) 10 MEQ tablet Take 1 tablet (10 mEq total) by mouth daily. 07/01/15  Yes Biagio Borg, MD  citalopram (CELEXA) 10 MG tablet Take 1 tablet (10  mg total) by mouth daily. 07/01/15   Biagio Borg, MD  rosuvastatin (CRESTOR) 10 MG tablet Take 1 tablet (10 mg total) by mouth daily. 07/01/15   Biagio Borg, MD   BP 155/81 mmHg  Pulse 98  Temp(Src) 97.7 F (36.5 C) (Oral)  Resp 17  Ht 4\' 11"  (1.499 m)  Wt 87.998 kg  BMI 39.16 kg/m2  SpO2 99% Physical Exam  Constitutional:  Awake, alert, nontoxic appearance.  HENT:  Head: Atraumatic.  Eyes: Right eye exhibits no discharge. Left eye exhibits no discharge.  Neck: Neck supple.  Pulmonary/Chest: Effort normal. She exhibits no tenderness.  Abdominal: Soft. There is no tenderness. There is no rebound.  Musculoskeletal:  Decreased range of motion at right shoulder, patient does not move shoulder secondary to pain. Appears to have a low riding right shoulder. Full active range of motion at right elbow and wrist with no focal bony tenderness.  Neurological:  Mental status and motor strength appears baseline for patient and situation. Neurovascularly intact distal to injury. Grip strength intact and equal bilaterally. Distal pulses intact with brisk cap refill. Sensation intact to light touch.  Skin: No rash noted.  Psychiatric: She has a normal mood and affect.  Nursing note and vitals reviewed.   ED Course  Reduction of dislocation Date/Time: 07/31/2015 8:33 AM Performed by: Comer Locket Authorized by: Comer Locket Consent: Verbal consent obtained. Risks and benefits: risks, benefits and alternatives were discussed Consent given by: patient Patient understanding: patient states understanding of the procedure being performed Imaging studies: imaging studies available Patient identity confirmed: verbally with patient Time out: Immediately prior to procedure a "time out" was called to verify the correct patient, procedure, equipment, support staff and site/side marked as required. Patient sedated: yes Sedation type: moderate (conscious) sedation Sedatives: diazepam Analgesia:  fentanyl Sedation start date/time: 07/31/2015 8:55 AM Sedation end date/time: 07/31/2015 9:07 AM Patient tolerance: Patient tolerated the procedure well with no immediate complications Comments: Successful reduction of right anterior shoulder dislocation   (including critical care time) Labs Review Labs Reviewed - No data to display  Imaging Review Dg Shoulder Right  07/31/2015  CLINICAL DATA:  Right shoulder pain. Fall this morning, landing on wooden floor. EXAM: RIGHT SHOULDER - 2+ VIEW COMPARISON:  09/18/2006 FINDINGS: There is anterior right shoulder dislocation. No visible fracture. AC joint is intact. IMPRESSION: Anterior right shoulder dislocation. Electronically Signed   By: Rolm Baptise M.D.   On: 07/31/2015 08:09   Dg Shoulder Right Port  07/31/2015  CLINICAL DATA:  Postreduction EXAM: PORTABLE RIGHT SHOULDER - 2+ VIEW COMPARISON:  07/31/2015 FINDINGS: Single AP projection demonstrates apparent reduction of the previously seen dislocated right shoulder. Normal AP  alignment. No visible fracture. IMPRESSION: Normal AP alignment postreduction. Electronically Signed   By: Rolm Baptise M.D.   On: 07/31/2015 10:04   Dg Shoulder Right Port  07/31/2015  CLINICAL DATA:  Acute right shoulder dislocation, postreduction EXAM: PORTABLE RIGHT SHOULDER - 2+ VIEW COMPARISON:  07/31/2015 FINDINGS: There is persistent anterior inferior dislocation of the right shoulder. IMPRESSION: Persistent anterior inferior dislocation. Electronically Signed   By: Jerilynn Mages.  Shick M.D.   On: 07/31/2015 09:47   I have personally reviewed and evaluated these images and lab results as part of my medical decision-making.   EKG Interpretation None     Meds given in ED:  Medications  traMADol (ULTRAM) tablet 50 mg (50 mg Oral Given 07/31/15 0725)  fentaNYL (SUBLIMAZE) injection 100 mcg (100 mcg Intravenous Given 07/31/15 0855)  midazolam (VERSED) injection 2 mg (2 mg Intravenous Given 07/31/15 0855)    New Prescriptions   No  medications on file   Filed Vitals:   07/31/15 0909 07/31/15 0914 07/31/15 0930 07/31/15 0933  BP: 142/78 146/69 139/72 155/81  Pulse: 97 97 97 98  Temp:      TempSrc:      Resp:      Height:      Weight:      SpO2: 97% 97% 98% 99%    MDM  Patient with right anterior shoulder dislocation secondary to mechanical fall. Reduction successful at bedside in emergency department. Confirmed with x-ray. Remains neurovascularly intact. Arm sling given with instructions to follow-up with PCP next week. Discussed with my attending, Dr. Johnney Killian. Final diagnoses:  Dislocation  Anterior shoulder dislocation, right, initial encounter      Comer Locket, PA-C 07/31/15 1025  Charlesetta Shanks, MD 08/18/15 [phone]

## 2015-07-31 NOTE — Discharge Instructions (Signed)
Wear your arm sling as we discussed. Take Motrin for discomfort as needed. Follow-up with your doctor in the next 2 days for reevaluation. Return to ED for new or worsening symptoms as we discussed.  Shoulder Dislocation A shoulder dislocation happens when the upper arm bone (humerus) moves out of the shoulder joint. The shoulder joint is the part of the shoulder where the humerus, shoulder blade (scapula), and collarbone (clavicle) meet. CAUSES This condition is often caused by:  A fall.  A hit to the shoulder.  A forceful movement of the shoulder. RISK FACTORS This condition is more likely to develop in people who play sports. SYMPTOMS Symptoms of this condition include:  Deformity of the shoulder.  Intense pain.  Inability to move the shoulder.  Numbness, weakness, or tingling in your neck or down your arm.  Bruising or swelling around your shoulder. DIAGNOSIS This condition is diagnosed with a physical exam. After the exam, tests may be done to check for related problems. Tests that may be done include:  X-ray. This may be done to check for broken bones.  MRI. This may be done to check for damage to the tissues around the shoulder.  Electromyogram. This may be done to check for nerve damage. TREATMENT This condition is treated with a procedure to place the humerus back in the joint. This procedure is called a reduction. There are two types of reduction:  Closed reduction. In this procedure, the humerus is placed back in the joint without surgery. The health care provider uses his or her hands to guide the bone back into place.  Open reduction. In this procedure, the humerus is placed back in the joint with surgery. An open reduction may be recommended if:  You have a weak shoulder joint or weak ligaments.  You have had more than one shoulder dislocation.  The nerves or blood vessels around your shoulder have been damaged. After the humerus is placed back into the  joint, your arm will be placed in a splint or sling to prevent it from moving. You will need to wear the splint or sling until your shoulder heals. When the splint or sling is removed, you may have physical therapy to help improve the range of motion in your shoulder joint. HOME CARE INSTRUCTIONS If You Have a Splint or Sling:  Wear it as told by your health care provider. Remove it only as told by your health care provider.  Loosen it if your fingers become numb and tingle, or if they turn cold and blue.  Keep it clean and dry. Bathing  Do not take baths, swim, or use a hot tub until your health care provider approves. Ask your health care provider if you can take showers. You may only be allowed to take sponge baths for bathing.  If your health care provider approves bathing and showering, cover your splint or sling with a watertight plastic bag to protect it from water. Do not let the splint or sling get wet. Managing Pain, Stiffness, and Swelling  If directed, apply ice to the injured area.  Put ice in a plastic bag.  Place a towel between your skin and the bag.  Leave the ice on for 20 minutes, 2-3 times per day.  Move your fingers often to avoid stiffness and to decrease swelling.  Raise (elevate) the injured area above the level of your heart while you are sitting or lying down. Driving  Do not drive while wearing a splint or sling  on a hand that you use for driving.  Do not drive or operate heavy machinery while taking pain medicine. Activity  Return to your normal activities as told by your health care provider. Ask your health care provider what activities are safe for you.  Perform range-of-motion exercises only as told by your health care provider.  Exercise your hand by squeezing a soft ball. This helps to decrease stiffness and swelling in your hand and wrist. General Instructions  Take over-the-counter and prescription medicines only as told by your health care  provider.  Do not use any tobacco products, including cigarettes, chewing tobacco, or e-cigarettes. Tobacco can delay bone and tissue healing. If you need help quitting, ask your health care provider.  Keep all follow-up visits as told by your health care provider. This is important. SEEK MEDICAL CARE IF:  Your splint or sling gets damaged. SEEK IMMEDIATE MEDICAL CARE IF:  Your pain gets worse rather than better.  You lose feeling in your arm or hand.  Your arm or hand becomes white and cold.   This information is not intended to replace advice given to you by your health care provider. Make sure you discuss any questions you have with your health care provider.   Document Released: 11/07/2000 Document Revised: 11/03/2014 Document Reviewed: 06/07/2014 Elsevier Interactive Patient Education [employer].

## 2015-08-02 ENCOUNTER — Other Ambulatory Visit: Payer: Self-pay | Admitting: *Deleted

## 2015-08-02 MED ORDER — CITALOPRAM HYDROBROMIDE 10 MG PO TABS: 10.0000 mg | ORAL_TABLET | Freq: Every day | ORAL | Status: DC

## 2015-08-03 ENCOUNTER — Encounter: Payer: Self-pay | Admitting: Family

## 2015-08-03 ENCOUNTER — Ambulatory Visit (INDEPENDENT_AMBULATORY_CARE_PROVIDER_SITE_OTHER): Payer: Medicare Other | Admitting: Family

## 2015-08-03 VITALS — BP 136/88 | HR 90 | Temp 98.0°F | Resp 16 | Ht 59.0 in | Wt 195.0 lb

## 2015-08-03 DIAGNOSIS — S43004D Unspecified dislocation of right shoulder joint, subsequent encounter: Secondary | ICD-10-CM | POA: Diagnosis not present

## 2015-08-03 DIAGNOSIS — S43006A Unspecified dislocation of unspecified shoulder joint, initial encounter: Secondary | ICD-10-CM

## 2015-08-03 HISTORY — DX: Unspecified dislocation of unspecified shoulder joint, initial encounter: S43.006A

## 2015-08-03 NOTE — Assessment & Plan Note (Signed)
Right shoulder dislocation with reduction in the emergency department appears stable and currently maintained in sling. Discussed importance of starting range of motion and movement below 90 of abduction and flexion. Continue ice and anti-inflammatories as needed for discomfort. Referral placed to orthopedics for potential need of MRI to rule out any additional secondary trauma related to her shoulder dislocation. Follow-up if symptoms worsen or do not improve pending orthopedic referral.

## 2015-08-03 NOTE — Progress Notes (Signed)
Subjective:    Patient ID: Brandi Shaffer, female    DOB: 1945/09/30, 70 y.o.   MRN: [id number]  Chief Complaint  Patient presents with  . Hospitalization Follow-up    dislocated right shoulder    HPI:  Brandi Shaffer is a 70 y.o. female who  has a past medical history of ANEMIA-IRON DEFICIENCY (12/06/2009); DIABETES MELLITUS, TYPE II (09/17/2006); DIVERTICULOSIS, COLON (12/06/2009); GENITAL HERPES (03/24/2010); HYPERLIPIDEMIA (03/24/2010); HYPERTENSION (09/17/2006); HYPOTHYROIDISM (09/17/2006); LIPOMA (12/06/2009); PERIPHERAL EDEMA (03/24/2010); GERD (gastroesophageal reflux disease) (06/28/2011); Renal cyst (07/05/2011); Asthma (12/05/2011); and Anxiety state (07/01/2015). and presents today for a follow up office visit.   This is a new problem. Recently evaluated in The emergency department following a fall where she was walking in her house and slipped on the wooden floor falling onto her right arm. Denied any head injury or loss of consciousness. Physical exam decreased range of motion of right shoulder secondary to pain and appearing low riding. X-ray imaging of the right shoulder showed anterior right shoulder dislocation. Shoulder was reduced which was confirmed with postreduction x-ray. She was neurovascularly intact and instructed to follow up with PCP.   Since leaving the hospital she reports that her shoulder has been stable and improved with her arm in the sling. Notes that she has only had to take ibuprofen for pain. Has remained in her sling since leaving the ED. Denies numbness, tingling, or worsening of symptoms.  Allergies  Allergen Reactions  . Januvia [Sitagliptin Phosphate]     dizziness  . Metformin And Related Nausea Only  . Doxycycline Rash     Current Outpatient Prescriptions on File Prior to Visit  Medication Sig Dispense Refill  . albuterol (PROAIR HFA) 108 (90 Base) MCG/ACT inhaler INHALE 2 PUFFS INTO THE LUNGS EVERY 6 (SIX) HOURS AS NEEDED FOR WHEEZING. 8.5 Inhaler 5    . amLODipine (NORVASC) 10 MG tablet Take 1 tablet (10 mg total) by mouth daily. 90 tablet 3  . aspirin 81 MG EC tablet TAKE 1 TABLET BY MOUTH DAILY. SWALLOW WHOLE. 30 tablet 11  . Cholecalciferol (VITAMIN D) 400 UNITS tablet Take 400 Units by mouth daily.      . citalopram (CELEXA) 10 MG tablet Take 1 tablet (10 mg total) by mouth daily. 90 tablet 3  . Coenzyme Q10 (COQ10) 100 MG capsule Take 100 mg by mouth daily.      Marland Kitchen glucose blood test strip 1 each by Other route 2 (two) times daily. Dx E11.9 200 each 11  . ibuprofen (ADVIL,MOTRIN) 400 MG tablet TAKE 1 TABLET (400 MG TOTAL) BY MOUTH EVERY 6 (SIX) HOURS AS NEEDED FOR PAIN. 60 tablet 5  . ipratropium-albuterol (DUONEB) 0.5-2.5 (3) MG/3ML SOLN USE 1 VIAL VIA NEBULIZER EVERY 6 HOURS AS NEEDED 360 mL 2  . irbesartan-hydrochlorothiazide (AVALIDE) 300-12.5 MG tablet Take 1 tablet by mouth daily. 90 tablet 3  . ONETOUCH DELICA LANCETS 33G MISC 1 each by Does not apply route 2 (two) times daily. Use to help check blood sugars twice a day Dx E11.9 200 each 11  . pioglitazone (ACTOS) 15 MG tablet Take 1 tablet (15 mg total) by mouth daily. 90 tablet 3  . potassium chloride (KLOR-CON 10) 10 MEQ tablet Take 1 tablet (10 mEq total) by mouth daily. 90 tablet 3  . rosuvastatin (CRESTOR) 10 MG tablet Take 1 tablet (10 mg total) by mouth daily. 90 tablet 3   No current facility-administered medications on file prior to visit.     Review  of Systems  Constitutional: Negative for fever and chills.  Musculoskeletal:       Positive for right shoulder pain.  Neurological: Positive for weakness. Negative for numbness.      Objective:    BP 136/88 mmHg  Pulse 90  Temp(Src) 98 F (36.7 C) (Oral)  Resp 16  Ht 4\' 11"  (1.499 m)  Wt 195 lb (88.451 kg)  BMI 39.36 kg/m2  SpO2 97% Nursing note and vital signs reviewed.  Physical Exam  Constitutional: She is oriented to person, place, and time. She appears well-developed and well-nourished. No distress.   Seated in the chair with a sling in place.   Cardiovascular: Normal rate, regular rhythm, normal heart sounds and intact distal pulses.   Pulmonary/Chest: Effort normal and breath sounds normal.  Musculoskeletal:  Right shoulder - No obvious deformity or discoloration with mild/moderate edema. No tenderness able to be elicited from palpation. Range of motion is restricted to less than 90 degrees in flexion and abduction secondary to pain and weakness. Distal pulses and sensation are intact and appropriate. Apprehension test deferred.   Neurological: She is alert and oriented to person, place, and time.  Skin: Skin is warm and dry.  Psychiatric: She has a normal mood and affect. Her behavior is normal. Judgment and thought content normal.       Assessment & Plan:   Problem List Items Addressed This Visit      Musculoskeletal and Integument   Shoulder dislocation - Primary    Right shoulder dislocation with reduction in the emergency department appears stable and currently maintained in sling. Discussed importance of starting range of motion and movement below 90 of abduction and flexion. Continue ice and anti-inflammatories as needed for discomfort. Referral placed to orthopedics for potential need of MRI to rule out any additional secondary trauma related to her shoulder dislocation. Follow-up if symptoms worsen or do not improve pending orthopedic referral.      Relevant Orders   AMB referral to orthopedics       I am having Ms. Collar maintain her Coenzyme Q10, vitamin D (CHOLECALCIFEROL), aspirin, amLODipine, ibuprofen, irbesartan-hydrochlorothiazide, ONETOUCH DELICA LANCETS 33G, pioglitazone, potassium chloride, albuterol, glucose blood, rosuvastatin, ipratropium-albuterol, and citalopram.   No orders of the defined types were placed in this encounter.     Follow-up: Return if symptoms worsen or fail to improve.  Jeanine Luz, FNP

## 2015-08-03 NOTE — Patient Instructions (Addendum)
Thank you for choosing [facility].  Summary/Instructions:  Move your shoulder.  Ice 2-3 times per day and after activity.  Ibuprofen as needed.   They will call to schedule your orthopedics appointment.   No driving or heavy lifting.   Please stop by the lab on the basement level of the building for your blood work. Your results will be released to [place] (or called to you) after review, usually within 72 hours after test completion. If any changes need to be made, you will be notified at that same time.  If your symptoms worsen or fail to improve, please contact our office for further instruction, or in case of emergency go directly to the emergency room at the closest medical facility.    Multidirectional Shoulder Instability With Rehab Anterior shoulder instability is a condition that is characterized by recurrent dislocation or partial dislocation (subluxation) of the shoulder joint. Dislocation is an injury in which two adjacent bones are no longer in proper alignment, and the joint surfaces are no longer touching. Subluxation is a similar injury to dislocation; however, the joint surfaces are still touching. With multidirectional shoulder instability, dislocations and subluxations of the shoulder joint (glenohumeral) involve the upper arm bone (humerus) displacing forward (anteriorly), downward (inferiorly), or backward (posteriorly). The shoulder joint allows more motion than any other joint in the body, and because of this it is highly susceptible to injury. When the glenohumeral joint is dislocated or subluxated, the muscles that control the shoulder joint (rotator cuff) tendons become stretched. Repetitive injury results in the shoulder joint becoming loose and results in instability of the shoulder joint. These injuries may also cause a tear in the labrum (cartilaginous rim) that lines the joint and helps keep the humerus head in place. SYMPTOMS   Severe shoulder pain when  the joint is dislocated or subluxated.  Shoulder weakness, pain, and/or inflammation.  Loss of shoulder function.  Pain that worsens with shoulder function, especially motions that involve arm movements above shoulder height.  Feeling of shoulder weakness or instability.  Signs of nerve damage: numbness or paralysis.  Crackling (crepitation) feeling and sound when the injured area is touched or with shoulder motion.  Often occurs in both shoulders. CAUSES  Multidirectional shoulder instability is caused by injury to the glenohumeral joint that causes it to become dislocated or subluxated. Common mechanisms of injury include:  Microtraumatic or atraumatic (most common).  Direct trauma to the shoulder joint.  Repetitive and/or strenuous movements of the shoulder joint, especially those with the arm above shoulder height.  Sprain of on the ligaments of the shoulder joint.  A shallow or malformed joint surface you are born with (congenital). RISK INCREASES WITH:  Contact sports (football, wrestling, and basketball).  Activities that involve repetitive and/or strenuous movements of the shoulder joint, especially those with the arm above shoulder height (baseball, volleyball, or swimming).  Previous shoulder injury.  Poor strength and flexibility.  Congenital abnormality (shallow or malformed joint surface). PREVENTION   Warm up and stretch properly before activity.  Allow for adequate recovery between workouts.  Maintain physical fitness:  Strength, flexibility, and endurance.  Cardiovascular fitness.  Learn and use proper technique. When possible, have coach correct improper technique.  Wear properly fitted and padded protective equipment. PROGNOSIS The extent of recovery and likelihood of future dislocations and subluxations depends on the extent of damage done to the shoulder. Reoccurrence of symptoms is likely for individuals with multidirectional shoulder  instability. RELATED COMPLICATIONS   Damage to the  nervous system or blood vessels that may cause weakness, paralysis, numbness, coldness, and paleness.  Damage to the bones or cartilage of the shoulder joint.  Permanent shoulder instability.  Tear of one or more of the rotator cuff tendons.  Arthritis of the shoulder. TREATMENT  When the shoulder joint is dislocated it must be reduced (the bones realigned) by someone who is trained in the procedure. Occasionally reduction cannot be performed manually, and requires surgery. After reduction, the use of ice and medication may help reduce pain and inflammation. The shoulder should be immobilized with a sling for 3 to 8 weeks to allow the joint to heal. After immobilization it is important to perform strengthening and stretching exercises to help regain strength and a full range of motion. These exercises may be completed at home or with a therapist. Surgery is reserved for individuals who have sustained multiple shoulder dislocations due to shoulder instability. MEDICATION   General anesthesia or muscle relaxants may be necessary for reduction of the shoulder joint.  If pain medication is necessary, then nonsteroidal anti-inflammatory medications, such as aspirin and ibuprofen, or other minor pain relievers, such as acetaminophen, are often recommended.  Do not take pain medication for 7 days before surgery.  Prescription pain relievers may be given if deemed necessary by your caregiver. Use only as directed and only as much as you need. COLD THERAPY  Cold treatment (icing) relieves pain and reduces inflammation. Cold treatment should be applied for 10 to 15 minutes every 2 to 3 hours for inflammation and pain and immediately after any activity that aggravates your symptoms. Use ice packs or massage the area with a piece of ice (ice massage). SEEK MEDICAL CARE IF:  Treatment seems to offer no benefit, or the condition worsens.  Any  medications produce adverse side effects.  Any complications from surgery occur:  Pain, numbness, or coldness in the extremity operated upon.  Discoloration of the nail beds (they become blue or gray) of the extremity operated upon.  Signs of infections (fever, pain, inflammation, redness, or persistent bleeding). EXERCISES RANGE OF MOTION (ROM) AND STRETCHING EXERCISES - Shoulder Instability, Multidirectional These exercises may help you restore your shoulder mobility once your physician has discontinued your 3-8 week immobilization period. The length of your immobilization depends on the intensity of your injury and the quality of the tissues before they were repaired. While completing these exercises, remember:   Restoring tissue flexibility helps normal motion to return to the joints. This allows healthier, less painful movement and activity.  An effective stretch should be held for at least 30 seconds.  A stretch should never be painful. You should only feel a gentle lengthening or release in the stretched tissue. During your recovery, avoid activity or exercises which involve actions that place your right / left hand or elbow above your head or behind your back or head. These positions stress the tissues which are trying to heal. ROM - Pendulum  Bend at the waist so that your right / left arm falls away from your body. Support yourself with your opposite hand on a solid surface, such as a table or a countertop.  Your right / left arm should be perpendicular to the ground. If it is not perpendicular, you need to lean over farther. Relax the muscles in your right / leftarm and shoulder as much as possible.  Gently sway your hips and trunk so they move your right / leftarm without any use of your right / left shoulder  muscles.  Progress your movements so that your right / left arm moves side to side, then forward and backward, and finally, both clockwise and counterclockwise.  Complete  __________ repetitions in each direction. Many people use this exercise to relieve discomfort in their shoulder as well as to gain range of motion. Repeat __________ times. Complete this exercise __________ times per day. ROM - Flexion, Active-Assisted  Lie on your back. You may bend your knees for comfort.  Grasp a broomstick or cane so your hands are about shoulder-width apart. Your right / left hand should grip the end of the stick/cane so that your hand is positioned "thumbs-up," as if you were about to shake hands.  Using your healthy arm to lead, raise your right / left arm overhead until you feel a gentle stretch in your shoulder. Hold __________ seconds.  Use the stick/cane to assist in returning your right / left arm to its starting position. Repeat __________ times. Complete this exercise __________ times per day.  ROM - Internal Rotation, Supine  Lie on your back on a firm surface. Place your right / left elbow about 60 degrees away from your side. Elevate your elbow with a folded towel so that the elbow and shoulder are the same height.  Using a broomstick or cane and your strong arm, pull your right / left hand toward your body until you feel a gentle stretch, but no increase in your shoulder pain. Keep your shoulder and elbow in place throughout the exercise.  Hold __________. Slowly return to the starting position. Repeat __________ times. Complete this exercise __________ times per day. STRETCH - External Rotation  Tuck a folded towel or small ball under your right / left upper arm. Grasp a broomstick or cane with an underhand grasp a little more than shoulder width apart. Bend your elbows to 90 degrees.  Stand with good posture or sit in a firm chair without arms.  Use your strong arm to push the stick across your body. Do not allow the towel or ball to fall. This will rotate your right / left arm away from your abdomen. Using the stick turn/rotate your hand and forearm  away from your body. Hold __________ seconds. Repeat __________ times. Complete this exercise __________ times per day.  STRETCH - Flexion, Seated  Sit in a firm chair so that your right / left forearm can rest on a table or on a table or countertop. Your right / left elbow should rest below the height of your shoulder so that your shoulder feels supported and not tense or uncomfortable.  Keeping your right / left shoulder relaxed, lean forward at your waist, allowing your right / left hand to slide forward. Bend forward until you feel a moderate stretch in your shoulder, but before you feel an increase in your pain.  Hold __________ seconds. Slowly return to your starting position. Repeat __________ times. Complete this exercise __________ times per day.  STRETCH - Flexion, Standing  Stand facing a wall. Walk your right / left fingers up the wall until you feel a moderate stretch in your shoulder. As your hand gets higher, you may need to step closer to the wall or use a door frame to walk through.  Try to avoid shrugging your right / left shoulder as your arm rises by keeping your shoulder blade tucked down and toward your mid-back spine.  Hold __________ seconds. Use your other hand, if needed, to ease out of the stretch and return  to the starting position. Repeat __________ times. Complete this exercise __________ times per day.  STRENGTHENING EXERCISES - Shoulder Instability, Multidirectional These exercises will help you begin to restore your shoulder strength once your physician has discontinued your 3-8 week immobilization period. The length of your immobilization depends on the intensity of your injury and the quality of the tissues before they were repaired. While completing these exercises, remember:   Muscles can gain both the endurance and the strength needed for everyday activities through controlled exercises.  Complete these exercises as instructed by your physician, physical  therapist or athletic trainer. Progress with the resistance and repetition exercises only as your caregiver advises.  You may experience muscle soreness or fatigue, but the pain or discomfort you are trying to eliminate should never worsen during these exercises. If this pain does worsen, stop and make certain you are following the directions exactly. If the pain is still present after adjustments, discontinue the exercise until you can discuss the trouble with your clinician.  During your recovery, avoid activity or exercises which involve actions that place your right / left hand or elbow above your head or behind your back or head. These positions stress the tissues which are trying to heal. STRENGTH - Scapular Depression and Adduction  With good posture, sit on a firm chair. Supported your arms in front of you with pillows, arm rests or a table top. Have your elbows in line with the sides of your body.  Gently draw your shoulder blades down and toward your mid-back spine. Gradually increase the tension without tensing the muscles along the top of your shoulders and the back of your neck.  Hold for __________ seconds. Slowly release the tension and relax your muscles completely before completing the next repetition.  After you have practiced this exercise, remove the arm support and complete it in standing as well as sitting. Repeat __________ times. Complete this exercise __________ times per day.  STRENGTH - Shoulder Flexion, Isometric  With good posture and facing a wall, stand or sit about 4-6 inches away.  Keeping your right / left elbow straight, gently press the top of your fist into the wall. Increase the pressure gradually until you are pressing as hard as you can without shrugging your shoulder or increasing any shoulder discomfort.  Hold __________ seconds.  Release the tension slowly. Relax your shoulder muscles completely before you do the next repetition. Repeat __________  times. Complete this exercise __________ times per day.  STRENGTH - Shoulder Abductors, Isometric  With good posture, stand or sit about 4-6 inches from a wall with your right / left side facing the wall.  Bend your right / left elbow. Gently press your right / left elbow into the wall. Increase the pressure gradually until you are pressing as hard as you can without shrugging your shoulder or increasing any shoulder discomfort.  Hold __________ seconds.  Release the tension slowly. Relax your shoulder muscles completely before you do the next repetition. Repeat __________ times. Complete this exercise __________ times per day.  STRENGTH - Internal Rotators, Isometric  Keep your right / left elbow at your side and bend it 90 degrees.  Step into a door frame so that the inside of your right / left wrist can press against the door frame without your upper arm leaving your side.  Gently press your right / left wrist into the door frame as if you were trying to draw the palm of your hand to your abdomen.  Gradually increase the tension until you are pressing as hard as you can without shrugging your shoulder or increasing any shoulder discomfort.  Hold __________ seconds.  Release the tension slowly. Relax your shoulder muscles completely before you do the next repetition. Repeat __________ times. Complete this exercise __________ times per day.  STRENGTH - External Rotators, Isometric   Keep your right / left elbow at your side and bend it 90 degrees.  Step into a door frame so that the outside of your right / left wrist can press against the door frame without your upper arm leaving your side.  Gently press your right / left wrist into the door frame as if you were trying to swing the back of your hand away from your abdomen. Gradually increase the tension until you are pressing as hard as you can without shrugging your shoulder or increasing any shoulder discomfort.  Hold __________  seconds.  Release the tension slowly. Relax your shoulder muscles completely before you do the next repetition. Repeat __________ times. Complete this exercise __________ times per day.  STRENGTH - Shoulder Extensors   Secure a rubber exercise band/tubing so that it is at the height of your shoulders when you are either standing or sitting on a firm arm-less chair.  With a thumbs-up grip, grasp an end of the band/tubing in each hand. Straighten your elbows and lift your hands straight in front of you at shoulder height. Step back away from the secured end of band/tubing until it becomes tense.  Squeezing your shoulder blades together, pull your hands down to the sides of your thighs. Do not allow your hands to go behind you.  Hold for __________ seconds. Slowly ease the tension on the band/tubing as you reverse the directions and return to the starting position. Repeat __________ times. Complete this exercise __________ times per day.  STRENGTH - Internal Rotators  Secure a rubber exercise band/tubing to a fixed object so that it is at the same height as your right / left elbow when you are standing or sitting on a firm surface.  Stand or sit so that the secured exercise band/tubing is at your right / left side.  Bend your elbow 90 degrees. Place a folded towel or small pillow under your right / left arm so that your elbow is a few inches away from your side.  Keeping the tension on the exercise band/tubing, pull it across your body toward your abdomen. Be sure to keep your body steady so that the movement is only coming from your shoulder rotating.  Hold __________ seconds. Release the tension in a controlled manner as you return to the starting position. Repeat __________ times. Complete this exercise __________ times per day.  STRENGTH - External Rotators  Secure a rubber exercise band/tubing to a fixed object so that it is at the same height as your right / left elbow when you are  standing or sitting on a firm surface.  Stand or sit so that the secured exercise band/tubing is at your side that is not injured.  Bend your elbow 90 degrees. Place a folded towel or small pillow under your right / left arm so that your elbow is a few inches away from your side.  Keeping the tension on the exercise band/tubing, pull it away from your body, as if pivoting on your elbow. Be sure to keep your body steady so that the movement is only coming from your shoulder rotating.  Hold __________ seconds. Release the  tension in a controlled manner as you return to the starting position. Repeat __________ times. Complete this exercise __________ times per day.  STRENGTH - Scapular Protractors, Standing  Stand arms-length away from a wall. Place your hands on the wall, keeping your elbows straight.  Begin by dropping your shoulder blades down and toward your mid-back spine.  To strengthen your protractors, keep your shoulder blades down, but slide them forward on your rib cage. It will feel as if you are lifting the back of your rib cage away from the wall. This is a subtle motion and can be challenging to complete. Ask your clinician for further instruction if you are not sure you are doing the exercise correctly.  Hold for __________ seconds. Slowly return to the starting position, resting the muscles completely before completing the next repetition. Repeat __________ times. Complete this exercise __________ times per day. STRENGTH - Shoulder Flexion  Stand or sit with good posture. Grasp a __________ weight or an exercise band/tubing so that your hand is "thumbs-up," like when you shake hands.  Slowly lift your right / left arm as far as you can without increasing any shoulder pain. Initially, many people can only raise their hand to shoulder height.  Avoid shrugging your right / left shoulder as your arm rises by keeping your shoulder blade tucked down and toward your mid-back  spine.  Hold for __________ seconds. Control the descent of your hand as you slowly return to your starting position. Repeat __________ times. Complete this exercise __________ times per day.  STRENGTH - Supraspinatus  Stand or sit with good posture. Grasp a __________ lb weight or an exercise band/tubing so that your hand is "thumbs-up," like when you shake hands.  Slowly lift your right / left hand from your thigh into the air, traveling about 30 degrees from straight out at your side. Lift your hand to shoulder height or as far as you can without increasing any shoulder pain. Initially, many people do not lift their hands above shoulder height.  Avoid shrugging your right / left shoulder as your arm rises by keeping your shoulder blade tucked down and toward your mid-back spine.  Hold for __________ seconds. Control the descent of your hand as you slowly return to your starting position. Repeat __________ times. Complete this exercise __________ times per day.  STRENGTH - Shoulder Abductors  Stand or sit with good posture. Place your right / left arm at your side.  With a thumbs-up grasp, hold a __________ weight or a secured rubber exercise band/tubing in your right / left hand. Slowly lift your arm from your side as far as you can without reproducing any of your shoulder pain. Do not lift your hand above shoulder-height unless you have been instructed to do so by your physician, physical therapist or athletic trainer. If this motion causes pain or increased discomfort, discuss this with your physician, physical therapist, or athletic trainer.  Avoid shrugging your right / left shoulder as your arm rises by keeping your shoulder blade tucked down and toward your mid-back spine.  Hold __________ seconds. Release the tension in a controlled manner as you return to the starting position.  Repeat __________ times. Complete this exercise __________ times per day. STRENGTH - Horizontal  Adductors  Secure a rubber exercise band/tubing so that it is at the height of your shoulders when you are either standing or sitting on a firm arm-less chair.  Turn away from the secured band/tube so it is directly  behind you. Grasp an end of the band/tubing in each hand and have your palms face each other. Step forward until the end of band/tubing until it becomes tense.  Keeping your arms at your sides, lift your elbows so they are 90 degrees from your body. Your arms should be slightly bent.  Keeping your arms elevated 90 degrees, draw your palms together.  Hold __________ seconds. Slowly ease the tension on the band/tubing as you reverse the directions and return to the starting position. Repeat __________ times. Complete this exercise __________ times per day. STRENGTH - Scapular Retractors and External Rotators  Secure a rubber exercise band/tubing so that it is at the height of your shoulders when you are either standing or sitting on a firm arm-less chair.  With a palm-down grip, grasp an end of the band/tubing in each hand. Bend your elbows 90 degrees and lift your elbows to shoulder height at your sides. Step back away from the secured end of band/tubing until it becomes tense.  Squeezing your shoulder blades together, rotate your shoulder so that your upper arm and elbow remain stationary, but your fists travel upward to head-height.  Hold for __________ seconds. Slowly ease the tension on the band/tubing as you reverse the directions and return to the starting position. Repeat __________ times. Complete this exercise __________ times per day.    This information is not intended to replace advice given to you by your health care provider. Make sure you discuss any questions you have with your health care provider.   Document Released: 02/12/2005 Document Revised: 11/03/2014 Document Reviewed: 05/27/2008 [employer]vier Interactive Patient Education [employer].

## 2015-08-15 DIAGNOSIS — S43004A Unspecified dislocation of right shoulder joint, initial encounter: Secondary | ICD-10-CM | POA: Diagnosis not present

## 2015-08-20 DIAGNOSIS — M25511 Pain in right shoulder: Secondary | ICD-10-CM | POA: Diagnosis not present

## 2015-08-24 DIAGNOSIS — S43004D Unspecified dislocation of right shoulder joint, subsequent encounter: Secondary | ICD-10-CM | POA: Diagnosis not present

## 2015-09-21 DIAGNOSIS — M25511 Pain in right shoulder: Secondary | ICD-10-CM | POA: Diagnosis not present

## 2015-09-21 DIAGNOSIS — S43004D Unspecified dislocation of right shoulder joint, subsequent encounter: Secondary | ICD-10-CM | POA: Diagnosis not present

## 2015-09-30 ENCOUNTER — Telehealth: Payer: Self-pay

## 2015-09-30 NOTE — Telephone Encounter (Signed)
Outreach to ms Mancuso to change her apt from Wed the 16th at 8am to 8/23 at am due to an apt conflict of provider

## 2015-10-12 ENCOUNTER — Ambulatory Visit: Payer: Medicare Other

## 2015-10-19 ENCOUNTER — Ambulatory Visit: Payer: Medicare Other

## 2015-11-09 ENCOUNTER — Other Ambulatory Visit: Payer: Self-pay | Admitting: Pharmacist

## 2015-11-09 ENCOUNTER — Telehealth: Payer: Self-pay | Admitting: Internal Medicine

## 2015-11-09 NOTE — Patient Outreach (Signed)
Outreach call to Adah Salvage regarding her request for follow up from the [facility] Medication Adherence Campaign. Called and spoke with patient. HIPAA identifiers verified and verbal consent received.  Ms. Rosson reports that she is only taking her rosuvastatin about once a week, rather than as prescribed. States that it's "too strong for me". Reports that she started taking it a couple of months ago and that she took it daily for about a week, but experienced muscle aches. Reports that since that time she has also been feeling run down, no energy. Reports that she started taking a multivitamin for women three months ago. Feels like that improved her energy.  Counsel patient on the importance of communicating with her doctor when she experiences side effects from her medications. Counsel patient on the importance of controlling cholesterol. Patient verbalizes understanding.  Patient reports that she has been on the rest of her medications for a long while and reports that she takes each as directed, denies missing any doses. Denies any difficulty with medication affordability.    Let Ms. Mcadoo know that I will contact patient's PCP on her behalf regarding the muscle aches that she had with rosuvastatin.  Patient states that she has no further medication questions or concerns at this time. Provide patient with my phone number.  Harlow Asa, PharmD Clinical Pharmacist [facility] [phone]

## 2015-11-09 NOTE — Telephone Encounter (Signed)
Brandi Shaffer called to advise the following on a follow up with the patient...  "Outreach call to Brandi Shaffer regarding his/her request for follow up from the [facility] Medication Adherence Campaign. Called and spoke with patient. HIPAA identifiers verified and verbal consent received.  Brandi Shaffer reports that she is only taking the rosuvastatin about once a week. States that it's too strong for me. Reports that she started taking it a couple of months ago and that she took it daily for about a week, but was muscle aches. "  The patient also states that she has been lacking energy since stopping.

## 2015-11-09 NOTE — Patient Outreach (Signed)
Call patient's PCP, Dr. Jenny Reichmann, to let him know that patient reports that she is only taking her rosuvastatin about once a week, rather than as prescribed. Let him know that she reports that she started taking it a couple of months ago and that she took it daily for about a week, but experienced muscle aches. Reports that since that time she has also been feeling run down, no energy. Reports that she started taking a multivitamin for women three months ago. Feels like that improved her energy.  Leave a message with Rachel Bo in the office. Request that the patient be followed-up with directly.  Harlow Asa, PharmD Clinical Pharmacist [facility] [phone]

## 2015-12-16 ENCOUNTER — Other Ambulatory Visit (INDEPENDENT_AMBULATORY_CARE_PROVIDER_SITE_OTHER): Payer: Medicare Other

## 2015-12-16 ENCOUNTER — Ambulatory Visit (INDEPENDENT_AMBULATORY_CARE_PROVIDER_SITE_OTHER): Payer: Medicare Other | Admitting: Internal Medicine

## 2015-12-16 ENCOUNTER — Ambulatory Visit (INDEPENDENT_AMBULATORY_CARE_PROVIDER_SITE_OTHER)
Admission: RE | Admit: 2015-12-16 | Discharge: 2015-12-16 | Disposition: A | Discharge status: Home / Self Care | Payer: Medicare Other | Source: Ambulatory Visit | Attending: Internal Medicine | Admitting: Internal Medicine

## 2015-12-16 ENCOUNTER — Encounter: Payer: Self-pay | Admitting: Internal Medicine

## 2015-12-16 VITALS — BP 140/80 | HR 94 | Temp 98.3°F | Resp 20 | Wt 197.0 lb

## 2015-12-16 DIAGNOSIS — E785 Hyperlipidemia, unspecified: Secondary | ICD-10-CM | POA: Diagnosis not present

## 2015-12-16 DIAGNOSIS — E119 Type 2 diabetes mellitus without complications: Secondary | ICD-10-CM

## 2015-12-16 DIAGNOSIS — Z13820 Encounter for screening for osteoporosis: Secondary | ICD-10-CM

## 2015-12-16 DIAGNOSIS — H6691 Otitis media, unspecified, right ear: Secondary | ICD-10-CM | POA: Insufficient documentation

## 2015-12-16 DIAGNOSIS — H66001 Acute suppurative otitis media without spontaneous rupture of ear drum, right ear: Secondary | ICD-10-CM | POA: Diagnosis not present

## 2015-12-16 DIAGNOSIS — I10 Essential (primary) hypertension: Secondary | ICD-10-CM | POA: Diagnosis not present

## 2015-12-16 LAB — URINALYSIS, ROUTINE W REFLEX MICROSCOPIC
Bilirubin Urine: NEGATIVE
Hgb urine dipstick: NEGATIVE
Ketones, ur: NEGATIVE
Nitrite: NEGATIVE
Specific Gravity, Urine: 1.01 (ref 1.000–1.030)
Total Protein, Urine: NEGATIVE
Urine Glucose: NEGATIVE
Urobilinogen, UA: 0.2 (ref 0.0–1.0)
pH: 5.5 (ref 5.0–8.0)

## 2015-12-16 LAB — LIPID PANEL
Cholesterol: 194 mg/dL (ref 0–200)
HDL: 54.9 mg/dL (ref >39.00)
LDL Cholesterol: 121 mg/dL — ABNORMAL HIGH (ref 0–99)
NonHDL: 139.32
Total CHOL/HDL Ratio: 4
Triglycerides: 90 mg/dL (ref 0.0–149.0)
VLDL: 18 mg/dL (ref 0.0–40.0)

## 2015-12-16 LAB — CBC WITH DIFFERENTIAL/PLATELET
Basophils Absolute: 0 10*3/uL (ref 0.0–0.1)
Basophils Relative: 0.7 % (ref 0.0–3.0)
Eosinophils Absolute: 0.3 10*3/uL (ref 0.0–0.7)
Eosinophils Relative: 4 % (ref 0.0–5.0)
HCT: 39 % (ref 36.0–46.0)
Hemoglobin: 13.2 g/dL (ref 12.0–15.0)
Lymphocytes Relative: 42.6 % (ref 12.0–46.0)
Lymphs Abs: 2.8 10*3/uL (ref 0.7–4.0)
MCHC: 33.9 g/dL (ref 30.0–36.0)
MCV: 89 fl (ref 78.0–100.0)
Monocytes Absolute: 0.5 10*3/uL (ref 0.1–1.0)
Monocytes Relative: 8.5 % (ref 3.0–12.0)
Neutro Abs: 2.9 10*3/uL (ref 1.4–7.7)
Neutrophils Relative %: 44.2 % (ref 43.0–77.0)
Platelets: 260 10*3/uL (ref 150.0–400.0)
RBC: 4.39 Mil/uL (ref 3.87–5.11)
RDW: 14.1 % (ref 11.5–15.5)
WBC: 6.5 10*3/uL (ref 4.0–10.5)

## 2015-12-16 LAB — HEPATIC FUNCTION PANEL
ALT: 10 U/L (ref 0–35)
AST: 15 U/L (ref 0–37)
Albumin: 4.3 g/dL (ref 3.5–5.2)
Alkaline Phosphatase: 123 U/L — ABNORMAL HIGH (ref 39–117)
Bilirubin, Direct: 0.1 mg/dL (ref 0.0–0.3)
Total Bilirubin: 0.6 mg/dL (ref 0.2–1.2)
Total Protein: 7.4 g/dL (ref 6.0–8.3)

## 2015-12-16 LAB — BASIC METABOLIC PANEL
BUN: 18 mg/dL (ref 6–23)
CO2: 32 mEq/L (ref 19–32)
Calcium: 9.7 mg/dL (ref 8.4–10.5)
Chloride: 105 mEq/L (ref 96–112)
Creatinine, Ser: 0.85 mg/dL (ref 0.40–1.20)
GFR: 84.79 mL/min (ref >60.00)
Glucose, Bld: 79 mg/dL (ref 70–99)
Potassium: 3.9 mEq/L (ref 3.5–5.1)
Sodium: 142 mEq/L (ref 135–145)

## 2015-12-16 LAB — HEMOGLOBIN A1C: Hgb A1c MFr Bld: 5.9 % (ref 4.6–6.5)

## 2015-12-16 MED ORDER — IRBESARTAN-HYDROCHLOROTHIAZIDE 300-12.5 MG PO TABS: 1.0000 | ORAL_TABLET | Freq: Every day | ORAL | 3 refills | Status: DC

## 2015-12-16 MED ORDER — ROSUVASTATIN CALCIUM 10 MG PO TABS: 10.0000 mg | ORAL_TABLET | Freq: Every day | ORAL | 3 refills | Status: DC

## 2015-12-16 MED ORDER — CITALOPRAM HYDROBROMIDE 10 MG PO TABS: 10.0000 mg | ORAL_TABLET | Freq: Every day | ORAL | 3 refills | Status: DC

## 2015-12-16 MED ORDER — AZITHROMYCIN 250 MG PO TABS: ORAL_TABLET | ORAL | 1 refills | Status: DC

## 2015-12-16 MED ORDER — ALBUTEROL SULFATE HFA 108 (90 BASE) MCG/ACT IN AERS: INHALATION_SPRAY | RESPIRATORY_TRACT | 5 refills | Status: DC

## 2015-12-16 MED ORDER — PIOGLITAZONE HCL 15 MG PO TABS: 15.0000 mg | ORAL_TABLET | Freq: Every day | ORAL | 3 refills | Status: DC

## 2015-12-16 MED ORDER — AMLODIPINE BESYLATE 10 MG PO TABS: 10.0000 mg | ORAL_TABLET | Freq: Every day | ORAL | 3 refills | Status: DC

## 2015-12-16 MED ORDER — POTASSIUM CHLORIDE ER 10 MEQ PO TBCR: 10.0000 meq | EXTENDED_RELEASE_TABLET | Freq: Every day | ORAL | 3 refills | Status: DC

## 2015-12-16 NOTE — Patient Instructions (Addendum)
You had the flu shot today  Please take all new medication as prescribed - the antibiotic  Please continue all other medications as before, and refills have been done if requested.  Please have the pharmacy call with any other refills you may need.  Please continue your efforts at being more active, low cholesterol diet, and weight control.  Please keep your appointments with your specialists as you may have planned  Please schedule the bone density test before leaving today at the scheduling desk (where you check out)  Please call for appt for mammogram after Christmas 2017  Please consider making appt with Dr Tamala Julian in this office for the left leg pain  /Please go to the LAB in the Basement (turn left off the elevator) for the tests to be done today  You will be contacted by phone if any changes need to be made immediately.  Otherwise, you will receive a letter about your results with an explanation, but please check with MyChart first.  Please remember to sign up for MyChart if you have not done so, as this will be important to you in the future with finding out test results, communicating by private email, and scheduling acute appointments online when needed.  Please return in 6 months, or sooner if needed, with Lab testing done 3-5 days before

## 2015-12-16 NOTE — Progress Notes (Signed)
Pre visit review using our clinic review tool, if applicable. No additional management support is needed unless otherwise documented below in the visit note. 

## 2015-12-16 NOTE — Progress Notes (Signed)
Subjective:    Patient ID: Brandi Shaffer, female    DOB: [birth year], 70 y.o.   MRN: [id number]  HPI  Here to f/u; overall doing ok,  Pt denies chest pain, increasing sob or doe, wheezing, orthopnea, PND, increased LE swelling, palpitations, dizziness or syncope.  Pt denies new neurological symptoms such as new headache, or facial or extremity weakness or numbness.  Pt denies polydipsia, polyuria, or low sugar episode.   Pt denies new neurological symptoms such as new headache, or facial or extremity weakness or numbness.   Pt states overall good compliance with meds, mostly trying to follow appropriate diet, with wt overall stable,  but little exercise however. No change in hx from previous. Also has not taken the crestor since last visit, wanted to discuss first today.  Also has 1 wk gradually worsening right ear pain, pressure, muffled hearing, feeling warm, without HA, sinus pain or congestion, ST, cough. Also c/o left upper leg/groin pain only with standing, no pain with sitting or lying, some radiation to the left upper anterior thigh but no LBP, and no bowel or bladder change, fever, wt loss,  worsening LE numbness/weakness, gait change or falls.   Past Medical History:  Diagnosis Date  . ANEMIA-IRON DEFICIENCY 12/06/2009  . Anxiety state 07/01/2015  . Asthma 12/05/2011  . DIABETES MELLITUS, TYPE II 09/17/2006  . DIVERTICULOSIS, COLON 12/06/2009  . GENITAL HERPES 03/24/2010  . GERD (gastroesophageal reflux disease) 06/28/2011  . HYPERLIPIDEMIA 03/24/2010  . HYPERTENSION 09/17/2006  . HYPOTHYROIDISM 09/17/2006  . LIPOMA 12/06/2009  . PERIPHERAL EDEMA 03/24/2010  . Renal cyst 07/05/2011   1.9 cm minimally complex   Past Surgical History:  Procedure Laterality Date  . ABDOMINAL HYSTERECTOMY     fibroids  . ankle surgury     x 3 left - chronic pain/swelling  . SHOULDER SURGERY Left   . TONSILLECTOMY    . TUBAL LIGATION      reports that she has never smoked. She does not have any smokeless  tobacco history on file. She reports that she does not drink alcohol or use drugs. family history includes Diabetes in her other; Heart disease in her sister; Hypertension in her father and mother; Joint hypermobility in her other; Prostate cancer in her brother; Stroke in her sister. Allergies  Allergen Reactions  . Januvia [Sitagliptin Phosphate]     dizziness  . Metformin And Related Nausea Only  . Doxycycline Rash   Current Outpatient Prescriptions on File Prior to Visit  Medication Sig Dispense Refill  . aspirin 81 MG EC tablet TAKE 1 TABLET BY MOUTH DAILY. SWALLOW WHOLE. 30 tablet 11  . Cholecalciferol (VITAMIN D) 400 UNITS tablet Take 400 Units by mouth daily.      . Coenzyme Q10 (COQ10) 100 MG capsule Take 100 mg by mouth daily.      Marland Kitchen glucose blood test strip 1 each by Other route 2 (two) times daily. Dx E11.9 200 each 11  . ibuprofen (ADVIL,MOTRIN) 400 MG tablet TAKE 1 TABLET (400 MG TOTAL) BY MOUTH EVERY 6 (SIX) HOURS AS NEEDED FOR PAIN. 60 tablet 5  . ipratropium-albuterol (DUONEB) 0.5-2.5 (3) MG/3ML SOLN USE 1 VIAL VIA NEBULIZER EVERY 6 HOURS AS NEEDED 360 mL 2  . Multiple Vitamins-Minerals (WOMENS MULTIVITAMIN PO) Take 1 tablet by mouth daily.    Letta Pate DELICA LANCETS 33G MISC 1 each by Does not apply route 2 (two) times daily. Use to help check blood sugars twice a day Dx E11.9 200  each 11   No current facility-administered medications on file prior to visit.    Review of Systems  Constitutional: Negative for unusual diaphoresis or night sweats HENT: Negative for ear swelling or discharge Eyes: Negative for worsening visual haziness  Respiratory: Negative for choking and stridor.   Gastrointestinal: Negative for distension or worsening eructation Genitourinary: Negative for retention or change in urine volume.  Musculoskeletal: Negative for other MSK pain or swelling Skin: Negative for color change and worsening wound Neurological: Negative for tremors and numbness  other than noted  Psychiatric/Behavioral: Negative for decreased concentration or agitation other than above       Objective:   Physical Exam BP 140/80   Pulse 94   Temp 98.3 F (36.8 C) (Oral)   Resp 20   Wt 197 lb (89.4 kg)   SpO2 96%   BMI 39.79 kg/m  VS noted,  Constitutional: Pt appears in no apparent distress HENT: Head: NCAT.  Right Ear: External ear normal.  Left Ear: External ear normal.  Right TM with marked erythema, Mild bulging Eyes: . Pupils are equal, round, and reactive to light. Conjunctivae and EOM are normal Neck: Normal range of motion. Neck supple.  Cardiovascular: Normal rate and regular rhythm.   Pulmonary/Chest: Effort normal and breath sounds without rales or wheezing.  Abd:  Soft, NT, ND, + BS Neurological: Pt is alert. Not confused , motor grossly intact Skin: Skin is warm. No rash, no LE edema Psychiatric: Pt behavior is normal. No agitation.  No left groin tender, swelling or rash, no spine or lateral hip tenderness     Assessment & Plan:

## 2015-12-17 NOTE — Assessment & Plan Note (Signed)
stable overall by history and exam, recent data reviewed with pt, and pt to continue medical treatment as before,  to f/u any worsening symptoms or concerns BP Readings from Last 3 Encounters:  12/16/15 140/80  08/03/15 136/88  07/31/15 146/63

## 2015-12-17 NOTE — Progress Notes (Signed)
[name] [address] [place], [place] [address] Phone: [phone] Subjective:    I'm seeing this patient by the request  of:  Cathlean Cower, MD   CC: Left leg pain  [id number]  Brandi Shaffer is a 70 y.o. female coming in with complaint of Left leg pain. Patient states that it seems to be more on the anterior lateral aspect the leg. Worse after sitting for long amount of time and better with rest. Patient states that when she gets moving he seems to get better but can take minutes. First steps in the morning can be difficult as well. Denies any radiation down the leg or any numbness. Denies any weakness. Still able to do daily activities but finds it still painful and possibly worsening. It over-the-counter medicines or icing.  Past Medical History:  Diagnosis Date  . ANEMIA-IRON DEFICIENCY 12/06/2009  . Anxiety state 07/01/2015  . Asthma 12/05/2011  . DIABETES MELLITUS, TYPE II 09/17/2006  . DIVERTICULOSIS, COLON 12/06/2009  . GENITAL HERPES 03/24/2010  . GERD (gastroesophageal reflux disease) 06/28/2011  . HYPERLIPIDEMIA 03/24/2010  . HYPERTENSION 09/17/2006  . HYPOTHYROIDISM 09/17/2006  . LIPOMA 12/06/2009  . PERIPHERAL EDEMA 03/24/2010  . Renal cyst 07/05/2011   1.9 cm minimally complex   Past Surgical History:  Procedure Laterality Date  . ABDOMINAL HYSTERECTOMY     fibroids  . ankle surgury     x 3 left - chronic pain/swelling  . SHOULDER SURGERY Left   . TONSILLECTOMY    . TUBAL LIGATION     Social History   Social History  . Marital status: Divorced    Spouse name: N/A  . Number of children: 7  . Years of education: N/A   Occupational History  . 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 Smoking status: Never Smoker  . Smokeless tobacco: Not on file  . Alcohol use No  . Drug use: No  . Sexual activity: Yes   Other Topics Concern  . Not on file   Social History Narrative   Lives with grandson  and and pets;    Have 6 sons and one dtr   Have grand- children; running children through out the day and cooking   Allergies  Allergen Reactions  . Januvia [Sitagliptin Phosphate]     dizziness  . Metformin And Related Nausea Only  . Doxycycline Rash   Family History  Problem Relation Age of Onset  . Hypertension Mother   . Hypertension Father   . Heart disease Sister     sudden death early 36's  . Stroke Sister   . Prostate cancer Brother     cancer  . Diabetes Other   . Joint hypermobility Other     DJD    Past medical history, social, surgical and family history all reviewed in electronic medical record.  No pertanent information unless stated regarding to the chief complaint.   Review of Systems: No headache, visual changes, nausea, vomiting, diarrhea, constipation, dizziness, abdominal pain, skin rash, fevers, chills, night sweats, weight loss, swollen lymph nodes, body aches, joint swelling, muscle aches, chest pain, shortness of breath, mood changes.   Objective  There were no vitals taken for this visit.  General: No apparent distress alert and oriented x3 mood and affect normal, dressed appropriately.  HEENT: Pupils equal, extraocular movements intact  Respiratory: Patient's speak in full sentences and does not appear short of  breath  Cardiovascular: No lower extremity edema, non tender, no erythema  Skin: Warm dry intact with no signs of infection or rash on extremities or on axial skeleton.  Abdomen: Soft nontender  Neuro: Cranial nerves II through XII are intact, neurovascularly intact in all extremities with 2+ DTRs and 2+ pulses.  Lymph: No lymphadenopathy of posterior or anterior cervical chain or axillae bilaterally.  Gait normal with good balance and coordination.  MSK:  Non tender with full range of motion and good stability and symmetric strength and tone of shoulders, elbows, wrist,  knee and ankles bilaterally. Mild arthritic changes of multiple  joints Hip: Left ROM IR: 25 Deg, ER: 45 Deg, Flexion: 120 Deg, Extension: 100 Deg, Abduction: 45 Deg, Adduction: 45 Deg Strength IR: 5/5, ER: 5/5, Flexion: 5/5, Extension: 5/5, Abduction: 5/5, Adduction: 5/5 Pelvic alignment unremarkable to inspection and palpation. Standing hip rotation and gait without trendelenburg sign / unsteadiness. Pain over the greater trochanter area as well as Corky Sox No SI joint tenderness and normal minimal SI movement. Contralateral hip unremarkable  Procedure note 97110; 15 minutes spent for Therapeutic exercises as stated in above notes.  This included exercises focusing on stretching, strengthening, with significant focus on eccentric aspects.  Hip strengthening exercises which included:  Pelvic tilt/bracing to help with proper recruitment of the lower abs and pelvic floor muscles  Glute strengthening to properly contract glutes without over-engaging low back and hamstrings - prone hip extension and glute bridge exercises Proper stretching techniques to increase effectiveness for the hip flexors, groin, quads, piriformic and low back when appropriate    Proper technique shown and discussed handout in great detail with ATC.  All questions were discussed and answered.     Impression and Recommendations:     This case required medical decision making of moderate complexity.      Note: This dictation was prepared with Dragon dictation along with smaller phrase technology. Any transcriptional errors that result from this process are unintentional.

## 2015-12-17 NOTE — Assessment & Plan Note (Addendum)
miild uncontrolled, to start crestor 10, stable overall by history and exam, recent data reviewed with pt, and pt to continue medical treatment as before,  to f/u any worsening symptoms or concerns Lab Results  Component Value Date   LDLCALC 121 (H) 12/16/2015

## 2015-12-17 NOTE — Assessment & Plan Note (Signed)
Mild to mod, for antibx course,  to f/u any worsening symptoms or concerns 

## 2015-12-19 ENCOUNTER — Encounter: Payer: Self-pay | Admitting: Family Medicine

## 2015-12-19 ENCOUNTER — Ambulatory Visit (INDEPENDENT_AMBULATORY_CARE_PROVIDER_SITE_OTHER): Payer: Medicare Other | Admitting: Family Medicine

## 2015-12-19 DIAGNOSIS — M7062 Trochanteric bursitis, left hip: Secondary | ICD-10-CM | POA: Diagnosis not present

## 2015-12-19 HISTORY — DX: Trochanteric bursitis, left hip: M70.62

## 2015-12-19 NOTE — Patient Instructions (Signed)
Good to see you  Ice 20 minutes 2 times daily. Usually after activity and before bed. Exercises 3 times a week.  pennsaid pinkie amount topically 2 times daily as needed.  Keep wearing the good shoes When in your jeep try to pu t a towel under your thigh to support the leg a little more See me again in 4 weeks and if not better lets consider injection.

## 2015-12-19 NOTE — Assessment & Plan Note (Signed)
Discussed with patient about prognosis. Discussed that patient should do very well. Differential includes lumbar radiculopathy but negative straight leg test. Patient is very minimally tender over the lateral aspect of the hip. Work with Product/process development scientist today, given trial of topical anti-inflammatories, we discussed icing regimen. Patient will continue to be active. Follow-up again with me in 4 weeks

## 2016-01-03 ENCOUNTER — Ambulatory Visit: Payer: Medicare Other | Admitting: Internal Medicine

## 2016-01-15 NOTE — Progress Notes (Signed)
[name] [address] [place], [place] [address] Phone: [phone] Subjective:    I'm seeing this patient by the request  of:  Cathlean Cower, MD   CC: Left leg pain f/u  [id number]  Brandi Shaffer is a 70 y.o. female coming in with complaint of Left leg pain. Patient was found to have more of a greater trochanteric bursitis. Patient was to try conservative therapy. Given home exercises, topical anti-inflammatories, icing regimen. Patient states Doing well overall. Patient sates that she has made a 60% improvement. Not having as much pain. Still going up and downstairs seems to be difficult. Still on the lateral aspect of the hip but no longer waking her up at night. Patient states when she does he exercises seems to get better as well.   Past Medical History:  Diagnosis Date  . ANEMIA-IRON DEFICIENCY 12/06/2009  . Anxiety state 07/01/2015  . Asthma 12/05/2011  . DIABETES MELLITUS, TYPE II 09/17/2006  . DIVERTICULOSIS, COLON 12/06/2009  . GENITAL HERPES 03/24/2010  . GERD (gastroesophageal reflux disease) 06/28/2011  . HYPERLIPIDEMIA 03/24/2010  . HYPERTENSION 09/17/2006  . HYPOTHYROIDISM 09/17/2006  . LIPOMA 12/06/2009  . PERIPHERAL EDEMA 03/24/2010  . Renal cyst 07/05/2011   1.9 cm minimally complex   Past Surgical History:  Procedure Laterality Date  . ABDOMINAL HYSTERECTOMY     fibroids  . ankle surgury     x 3 left - chronic pain/swelling  . SHOULDER SURGERY Left   . TONSILLECTOMY    . TUBAL LIGATION     Social History   Social History  . Marital status: Divorced    Spouse name: N/A  . Number of children: 7  . Years of education: N/A   Occupational History  . 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 Smoking status: Never Smoker  . Smokeless tobacco: None  . Alcohol use No  . Drug use: No  . Sexual activity: Yes   Other Topics Concern  . None   Social History Narrative   Lives with grandson and  and pets;    Have 6 sons and one dtr   Have grand- children; running children through out the day and cooking   Allergies  Allergen Reactions  . Januvia [Sitagliptin Phosphate]     dizziness  . Metformin And Related Nausea Only  . Doxycycline Rash   Family History  Problem Relation Age of Onset  . Hypertension Mother   . Hypertension Father   . Heart disease Sister     sudden death early 33's  . Stroke Sister   . Prostate cancer Brother     cancer  . Diabetes Other   . Joint hypermobility Other     DJD    Past medical history, social, surgical and family history all reviewed in electronic medical record.  No pertanent information unless stated regarding to the chief complaint.   Review of Systems: No headache, visual changes, nausea, vomiting, diarrhea, constipation, dizziness, abdominal pain, skin rash, fevers, chills, night sweats, weight loss, swollen lymph nodes, body aches, joint swelling, muscle aches, chest pain, shortness of breath, mood changes.   Objective  Blood pressure 132/74, pulse 83, height 4\' 11"  (1.499 m), weight 198 lb (89.8 kg), SpO2 97 %.  Systems examined below as of 01/16/16 General: NAD A&O x3 mood, affect normal  HEENT: Pupils equal, extraocular movements intact no nystagmus Respiratory: not short of breath at rest  or with speaking Cardiovascular: No lower extremity edema, non tender Skin: Warm dry intact with no signs of infection or rash on extremities or on axial skeleton. Abdomen: Soft nontender, no masses Neuro: Cranial nerves  intact, neurovascularly intact in all extremities with 2+ DTRs and 2+ pulses. Lymph: No lymphadenopathy appreciated today  Gait normal with good balance and coordination.  MSK:  Non tender with full range of motion and good stability and symmetric strength and tone of shoulders, elbows, wrist,  knee and ankles bilaterally. Mild arthritic changes of multiple joints Hip: Left ROM IR: 25 Deg, ER: 45 Deg, Flexion: 120 Deg,  Extension: 100 Deg, Abduction: 45 Deg, Adduction: 45 Deg Strength IR: 5/5, ER: 5/5, Flexion: 5/5, Extension: 5/5, Abduction: 5/5, Adduction: 5/5 Pelvic alignment unremarkable to inspection and palpation. Standing hip rotation and gait without trendelenburg sign / unsteadiness. Minimal pain still remaining of the greater trochanteric area but still has a positive Faber No SI joint tenderness and normal minimal SI movement. Contralateral hip unremarkable     Impression and Recommendations:     This case required medical decision making of moderate complexity.      Note: This dictation was prepared with Dragon dictation along with smaller phrase technology. Any transcriptional errors that result from this process are unintentional.

## 2016-01-16 ENCOUNTER — Ambulatory Visit (INDEPENDENT_AMBULATORY_CARE_PROVIDER_SITE_OTHER): Payer: Medicare Other | Admitting: Family Medicine

## 2016-01-16 ENCOUNTER — Encounter: Payer: Self-pay | Admitting: Family Medicine

## 2016-01-16 DIAGNOSIS — M7062 Trochanteric bursitis, left hip: Secondary | ICD-10-CM | POA: Diagnosis not present

## 2016-01-16 NOTE — Patient Instructions (Signed)
Good to see you  Ice is your friend Ice 20 minutes 2 times daily. Usually after activity and before bed. Keep doing the exercises 3 times a week.  You are doing great  Stay in the first rows at the stadium, not going up the stairs.  Xelero shoes and allegria See me again in 6 weeks.  Happy holidays!

## 2016-01-16 NOTE — Assessment & Plan Note (Signed)
Improving slowly. Decline formal physical therapy, decline icing. Given more topical anti-inflammatories. We discussed proper shoes. Discussed the importance of weight loss. Follow-up again in 6 weeks. If worsening symptoms consider injection and formal physical therapy.

## 2016-01-31 ENCOUNTER — Encounter: Payer: Self-pay | Admitting: Internal Medicine

## 2016-01-31 DIAGNOSIS — E119 Type 2 diabetes mellitus without complications: Secondary | ICD-10-CM | POA: Diagnosis not present

## 2016-01-31 DIAGNOSIS — H2511 Age-related nuclear cataract, right eye: Secondary | ICD-10-CM | POA: Diagnosis not present

## 2016-01-31 DIAGNOSIS — H43811 Vitreous degeneration, right eye: Secondary | ICD-10-CM | POA: Diagnosis not present

## 2016-01-31 DIAGNOSIS — H43812 Vitreous degeneration, left eye: Secondary | ICD-10-CM | POA: Diagnosis not present

## 2016-01-31 LAB — HM DIABETES EYE EXAM

## 2016-02-25 DIAGNOSIS — E119 Type 2 diabetes mellitus without complications: Secondary | ICD-10-CM | POA: Diagnosis not present

## 2016-02-28 NOTE — Progress Notes (Signed)
[name], [name] Phone: [phone] Subjective:    I'm seeing this patient by the request  of:  Cathlean Cower, MD   CC: Left leg pain f/u  [id number]  Brandi Shaffer is a 71 y.o. female coming in with complaint of Left leg pain. Patient was found to have more of a greater trochanteric bursitis. Patient was to try conservative therapy. Given home exercises, topical anti-inflammatories, icing regimen.  Patient was 60% better at follow up. Patient was to continue with conservative therapy but has found it difficult overall. Patient did have some family issues and seem to contribute into her missing some of the activity as well as physical therapy. Patient states Overall doing very well. Still has some mild discomfort. Feels that she has significant tightness in the morning especially once cold.   Past Medical History:  Diagnosis Date  . ANEMIA-IRON DEFICIENCY 12/06/2009  . Anxiety state 07/01/2015  . Asthma 12/05/2011  . DIABETES MELLITUS, TYPE II 09/17/2006  . DIVERTICULOSIS, COLON 12/06/2009  . GENITAL HERPES 03/24/2010  . GERD (gastroesophageal reflux disease) 06/28/2011  . HYPERLIPIDEMIA 03/24/2010  . HYPERTENSION 09/17/2006  . HYPOTHYROIDISM 09/17/2006  . LIPOMA 12/06/2009  . PERIPHERAL EDEMA 03/24/2010  . Renal cyst 07/05/2011   1.9 cm minimally complex   Past Surgical History:  Procedure Laterality Date  . ABDOMINAL HYSTERECTOMY     fibroids  . ankle surgury     x 3 left - chronic pain/swelling  . SHOULDER SURGERY Left   . TONSILLECTOMY    . TUBAL LIGATION     Social History   Social History  . Marital status: Divorced    Spouse name: N/A  . Number of children: 7  . Years of education: N/A   Occupational History  . 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 Smoking status: Never Smoker  . Smokeless tobacco: None  . Alcohol use No  . Drug use: No  . Sexual activity: Yes    Other Topics Concern  . None   Social History Narrative   Lives with grandson and and pets;    Have 6 sons and one dtr   Have grand- children; running children through out the day and cooking   Allergies  Allergen Reactions  . Januvia [Sitagliptin Phosphate]     dizziness  . Metformin And Related Nausea Only  . Doxycycline Rash   Family History  Problem Relation Age of Onset  . Hypertension Mother   . Hypertension Father   . Heart disease Sister     sudden death early 50's  . Stroke Sister   . Prostate cancer Brother     cancer  . Diabetes Other   . Joint hypermobility Other     DJD    Past medical history, social, surgical and family history all reviewed in electronic medical record.  No pertanent information unless stated regarding to the chief complaint.   Review of Systems: No headache, visual changes, nausea, vomiting, diarrhea, constipation, dizziness, abdominal pain, skin rash, fevers, chills, night sweats, weight loss, swollen lymph nodes, chest pain, shortness of breath, mood changes.  .    Objective  Blood pressure (!) 162/90, pulse 84, height 4\' 11"  (1.499 m), weight 198 lb (89.8 kg).  Systems examined below as of 02/29/16 General: NAD A&O x3 mood, affect normal  HEENT: Pupils equal, extraocular movements intact no nystagmus Respiratory: not short  of breath at rest or with speaking Cardiovascular: No lower extremity edema, non tender Skin: Warm dry intact with no signs of infection or rash on extremities or on axial skeleton. Abdomen: Soft nontender, no masses Neuro: Cranial nerves  intact, neurovascularly intact in all extremities with 2+ DTRs and 2+ pulses. Lymph: No lymphadenopathy appreciated today  Gait normal with good balance and coordination.  MSK:  Non tender with full range of motion and good stability and symmetric strength and tone of shoulders, elbows, wrist,  knee and ankles bilaterally. Mild arthritic changes of multiple joints Hip:  Left ROM IR: 25 Deg, ER: 45 Deg, Flexion: 120 Deg, Extension: 100 Deg, Abduction: 45 Deg, Adduction: 45 Deg Strength IR: 5/5, ER: 5/5, Flexion: 5/5, Extension: 5/5, Abduction: 5/5, Adduction: 4/5 Pelvic alignment unremarkable to inspection and palpation. Standing hip rotation and gait without trendelenburg sign / unsteadiness. Continued positive Corky Sox but no pain over the lateral aspect No SI joint tenderness and normal minimal SI movement. Contralateral hip unremarkable     Impression and Recommendations:     This case required medical decision making of moderate complexity.      Note: This dictation was prepared with Dragon dictation along with smaller phrase technology. Any transcriptional errors that result from this process are unintentional.

## 2016-02-29 ENCOUNTER — Ambulatory Visit (INDEPENDENT_AMBULATORY_CARE_PROVIDER_SITE_OTHER): Payer: Medicare Other | Admitting: Family Medicine

## 2016-02-29 ENCOUNTER — Encounter: Payer: Self-pay | Admitting: Family Medicine

## 2016-02-29 DIAGNOSIS — M7062 Trochanteric bursitis, left hip: Secondary | ICD-10-CM

## 2016-02-29 NOTE — Patient Instructions (Addendum)
Happy New Year! You are doing great  Let add tylenol 325mg  or 500mg  3 times a day  Gabapentin 1 pill at night Do my exercises 2 times a week  Stay active See me again in 2 months!

## 2016-02-29 NOTE — Assessment & Plan Note (Signed)
Seems to be stable at this time. His lungs patient continues to do relatively well we will continue to monitor. An underlying arthritis seems to be exacerbating some of the condition. Patient will call if any worsening symptoms or weakness. Encourage her to take such things as the gabapentin on a regular basis.

## 2016-04-05 ENCOUNTER — Other Ambulatory Visit: Payer: Self-pay | Admitting: Internal Medicine

## 2016-04-05 ENCOUNTER — Ambulatory Visit
Admission: RE | Admit: 2016-04-05 | Discharge: 2016-04-05 | Disposition: A | Discharge status: Home / Self Care | Payer: Medicare Other | Source: Ambulatory Visit | Attending: Internal Medicine | Admitting: Internal Medicine

## 2016-04-05 DIAGNOSIS — Z1231 Encounter for screening mammogram for malignant neoplasm of breast: Secondary | ICD-10-CM | POA: Diagnosis not present

## 2016-04-30 ENCOUNTER — Ambulatory Visit: Payer: Medicare Other | Admitting: Family Medicine

## 2016-06-13 ENCOUNTER — Telehealth: Payer: Self-pay | Admitting: Internal Medicine

## 2016-06-13 NOTE — Telephone Encounter (Signed)
Spoke with patient regarding awv. Patient stated that she has already completed the annual wellness with [employer]. Pt will bring documentation to office when she comes in for her visit.

## 2016-06-15 ENCOUNTER — Ambulatory Visit (INDEPENDENT_AMBULATORY_CARE_PROVIDER_SITE_OTHER): Payer: Medicare Other | Admitting: Internal Medicine

## 2016-06-15 ENCOUNTER — Other Ambulatory Visit (INDEPENDENT_AMBULATORY_CARE_PROVIDER_SITE_OTHER): Payer: Medicare Other

## 2016-06-15 ENCOUNTER — Encounter: Payer: Self-pay | Admitting: Internal Medicine

## 2016-06-15 VITALS — BP 140/90 | HR 78 | Ht 59.0 in | Wt 196.0 lb

## 2016-06-15 DIAGNOSIS — I739 Peripheral vascular disease, unspecified: Secondary | ICD-10-CM | POA: Diagnosis not present

## 2016-06-15 DIAGNOSIS — E119 Type 2 diabetes mellitus without complications: Secondary | ICD-10-CM

## 2016-06-15 DIAGNOSIS — Z0000 Encounter for general adult medical examination without abnormal findings: Secondary | ICD-10-CM | POA: Diagnosis not present

## 2016-06-15 LAB — URINALYSIS, ROUTINE W REFLEX MICROSCOPIC
Bilirubin Urine: NEGATIVE
Hgb urine dipstick: NEGATIVE
Ketones, ur: NEGATIVE
Nitrite: NEGATIVE
Specific Gravity, Urine: 1.01 (ref 1.000–1.030)
Total Protein, Urine: NEGATIVE
Urine Glucose: NEGATIVE
Urobilinogen, UA: 0.2 (ref 0.0–1.0)
pH: 7 (ref 5.0–8.0)

## 2016-06-15 LAB — BASIC METABOLIC PANEL
BUN: 19 mg/dL (ref 6–23)
CO2: 29 mEq/L (ref 19–32)
Calcium: 9.3 mg/dL (ref 8.4–10.5)
Chloride: 105 mEq/L (ref 96–112)
Creatinine, Ser: 0.92 mg/dL (ref 0.40–1.20)
GFR: 77.28 mL/min (ref >60.00)
Glucose, Bld: 96 mg/dL (ref 70–99)
Potassium: 3.9 mEq/L (ref 3.5–5.1)
Sodium: 141 mEq/L (ref 135–145)

## 2016-06-15 LAB — CBC WITH DIFFERENTIAL/PLATELET
Basophils Absolute: 0 10*3/uL (ref 0.0–0.1)
Basophils Relative: 0.7 % (ref 0.0–3.0)
Eosinophils Absolute: 0.2 10*3/uL (ref 0.0–0.7)
Eosinophils Relative: 4 % (ref 0.0–5.0)
HCT: 40.1 % (ref 36.0–46.0)
Hemoglobin: 13.2 g/dL (ref 12.0–15.0)
Lymphocytes Relative: 43.1 % (ref 12.0–46.0)
Lymphs Abs: 2.4 10*3/uL (ref 0.7–4.0)
MCHC: 32.8 g/dL (ref 30.0–36.0)
MCV: 92 fl (ref 78.0–100.0)
Monocytes Absolute: 0.5 10*3/uL (ref 0.1–1.0)
Monocytes Relative: 8.3 % (ref 3.0–12.0)
Neutro Abs: 2.4 10*3/uL (ref 1.4–7.7)
Neutrophils Relative %: 43.9 % (ref 43.0–77.0)
Platelets: 244 10*3/uL (ref 150.0–400.0)
RBC: 4.36 Mil/uL (ref 3.87–5.11)
RDW: 14.2 % (ref 11.5–15.5)
WBC: 5.5 10*3/uL (ref 4.0–10.5)

## 2016-06-15 LAB — LIPID PANEL
Cholesterol: 190 mg/dL (ref 0–200)
HDL: 58.4 mg/dL (ref >39.00)
LDL Cholesterol: 114 mg/dL — ABNORMAL HIGH (ref 0–99)
NonHDL: 131.49
Total CHOL/HDL Ratio: 3
Triglycerides: 89 mg/dL (ref 0.0–149.0)
VLDL: 17.8 mg/dL (ref 0.0–40.0)

## 2016-06-15 LAB — HEPATIC FUNCTION PANEL
ALT: 12 U/L (ref 0–35)
AST: 17 U/L (ref 0–37)
Albumin: 4.3 g/dL (ref 3.5–5.2)
Alkaline Phosphatase: 122 U/L — ABNORMAL HIGH (ref 39–117)
Bilirubin, Direct: 0.2 mg/dL (ref 0.0–0.3)
Total Bilirubin: 0.7 mg/dL (ref 0.2–1.2)
Total Protein: 7.1 g/dL (ref 6.0–8.3)

## 2016-06-15 LAB — MICROALBUMIN / CREATININE URINE RATIO
Creatinine,U: 67.8 mg/dL
Microalb Creat Ratio: 1 mg/g (ref 0.0–30.0)
Microalb, Ur: 0.7 mg/dL (ref 0.0–1.9)

## 2016-06-15 LAB — HEMOGLOBIN A1C: Hgb A1c MFr Bld: 5.9 % (ref 4.6–6.5)

## 2016-06-15 LAB — TSH: TSH: 1.95 u[IU]/mL (ref 0.35–4.50)

## 2016-06-15 MED ORDER — COLLOIDAL OATMEAL 1 % EX CREA: TOPICAL_CREAM | CUTANEOUS | 5 refills | Status: DC

## 2016-06-15 MED ORDER — POTASSIUM CHLORIDE ER 10 MEQ PO TBCR: 10.0000 meq | EXTENDED_RELEASE_TABLET | Freq: Every day | ORAL | 3 refills | Status: DC

## 2016-06-15 MED ORDER — AMLODIPINE BESYLATE 10 MG PO TABS: 10.0000 mg | ORAL_TABLET | Freq: Every day | ORAL | 3 refills | Status: DC

## 2016-06-15 MED ORDER — PIOGLITAZONE HCL 15 MG PO TABS: 15.0000 mg | ORAL_TABLET | Freq: Every day | ORAL | 3 refills | Status: DC

## 2016-06-15 NOTE — Addendum Note (Signed)
Addended by: Biagio Borg on: 06/15/2016 09:49 AM   Modules accepted: Orders

## 2016-06-15 NOTE — Assessment & Plan Note (Signed)
[place] for f/u PAD study given hx of DM

## 2016-06-15 NOTE — Assessment & Plan Note (Signed)

## 2016-06-15 NOTE — Assessment & Plan Note (Signed)
.  stable overall by history and exam, recent data reviewed with pt, and pt to continue medical treatment as before,  to f/u any worsening symptoms or concerns Lab Results  Component Value Date   HGBA1C 5.9 12/16/2015

## 2016-06-15 NOTE — Patient Instructions (Signed)
Please continue all other medications as before, and refills have been done if requested.  Please have the pharmacy call with any other refills you may need.  Please continue your efforts at being more active, low cholesterol diet, and weight control.  You are otherwise up to date with prevention measures today.  Please keep your appointments with your specialists as you may have planned  You will be contacted regarding the referral for: Circulation test for the legs  Please go to the LAB in the Basement (turn left off the elevator) for the tests to be done today  You will be contacted by phone if any changes need to be made immediately.  Otherwise, you will receive a letter about your results with an explanation, but please check with MyChart first.  Please remember to sign up for MyChart if you have not done so, as this will be important to you in the future with finding out test results, communicating by private email, and scheduling acute appointments online when needed.  If you have Medicare related insurance (such as traditional Medicare, Blue [employer] or [employer], or similar), Please make an appointment at the [facility] with Sharee Pimple, the [employer], for your Wellness Visit in this office, which is a benefit with your insurance.  Please return in 6 months, or sooner if needed, with Lab testing done 3-5 days before

## 2016-06-15 NOTE — Progress Notes (Signed)
Subjective:    Patient ID: Brandi Shaffer, female    DOB: May 22, 1945, 71 y.o.   MRN: [id number]  HPI      Larey Seat 2 days ago accidentally after got off balance picking up something in the yard, and just went over.  Has a free wrist bracelet monitor but not yet set up.  Did have an abnormal home testing for vascular insufficiency per [facility] home nurse; with "quantoflo PAD result" - left foot 0.78, right foot 0.69. Does have hx of left ankle fracture with intact hardware in place.  Laments her current home situation where she was forced to take in her daughter and 2 kids after they lost the house.  Cuases marked stress for her, and cost her more in utility.   Past Medical History:  Diagnosis Date  . ANEMIA-IRON DEFICIENCY 12/06/2009  . Anxiety state 07/01/2015  . Asthma 12/05/2011  . DIABETES MELLITUS, TYPE II 09/17/2006  . DIVERTICULOSIS, COLON 12/06/2009  . GENITAL HERPES 03/24/2010  . GERD (gastroesophageal reflux disease) 06/28/2011  . HYPERLIPIDEMIA 03/24/2010  . HYPERTENSION 09/17/2006  . HYPOTHYROIDISM 09/17/2006  . LIPOMA 12/06/2009  . PERIPHERAL EDEMA 03/24/2010  . Renal cyst 07/05/2011   1.9 cm minimally complex   Past Surgical History:  Procedure Laterality Date  . ABDOMINAL HYSTERECTOMY     fibroids  . ankle surgury     x 3 left - chronic pain/swelling  . SHOULDER SURGERY Left   . TONSILLECTOMY    . TUBAL LIGATION      reports that she has never smoked. She has never used smokeless tobacco. She reports that she does not drink alcohol or use drugs. family history includes Diabetes in her other; Heart disease in her sister; Hypertension in her father and mother; Joint hypermobility in her other; Prostate cancer in her brother; Stroke in her sister. Allergies  Allergen Reactions  . Januvia [Sitagliptin Phosphate]     dizziness  . Metformin And Related Nausea Only  . Doxycycline Rash   Current Outpatient Prescriptions on File Prior to Visit  Medication Sig Dispense Refill  .  albuterol (PROAIR HFA) 108 (90 Base) MCG/ACT inhaler INHALE 2 PUFFS INTO THE LUNGS EVERY 6 (SIX) HOURS AS NEEDED FOR WHEEZING. 8.5 Inhaler 5  . amLODipine (NORVASC) 10 MG tablet Take 1 tablet (10 mg total) by mouth daily. 90 tablet 3  . aspirin 81 MG EC tablet TAKE 1 TABLET BY MOUTH DAILY. SWALLOW WHOLE. 30 tablet 11  . Cholecalciferol (VITAMIN D) 400 UNITS tablet Take 400 Units by mouth daily.      . citalopram (CELEXA) 10 MG tablet Take 1 tablet (10 mg total) by mouth daily. 90 tablet 3  . Coenzyme Q10 (COQ10) 100 MG capsule Take 100 mg by mouth daily.      Marland Kitchen glucose blood test strip 1 each by Other route 2 (two) times daily. Dx E11.9 200 each 11  . ibuprofen (ADVIL,MOTRIN) 400 MG tablet TAKE 1 TABLET (400 MG TOTAL) BY MOUTH EVERY 6 (SIX) HOURS AS NEEDED FOR PAIN. 60 tablet 5  . ipratropium-albuterol (DUONEB) 0.5-2.5 (3) MG/3ML SOLN USE 1 VIAL VIA NEBULIZER EVERY 6 HOURS AS NEEDED 360 mL 2  . irbesartan-hydrochlorothiazide (AVALIDE) 300-12.5 MG tablet Take 1 tablet by mouth daily. 90 tablet 3  . Multiple Vitamins-Minerals (WOMENS MULTIVITAMIN PO) Take 1 tablet by mouth daily.    Letta Pate DELICA LANCETS 33G MISC 1 each by Does not apply route 2 (two) times daily. Use to help check blood sugars twice  a day Dx E11.9 200 each 11  . pioglitazone (ACTOS) 15 MG tablet Take 1 tablet (15 mg total) by mouth daily. 90 tablet 3  . potassium chloride (KLOR-CON 10) 10 MEQ tablet Take 1 tablet (10 mEq total) by mouth daily. 90 tablet 3  . rosuvastatin (CRESTOR) 10 MG tablet Take 1 tablet (10 mg total) by mouth daily. 90 tablet 3   No current facility-administered medications on file prior to visit.    Review of Systems Constitutional: Negative for other unusual diaphoresis, sweats, appetite or weight changes HENT: Negative for other worsening hearing loss, ear pain, facial swelling, mouth sores or neck stiffness.   Eyes: Negative for other worsening pain, redness or other visual disturbance.    Respiratory: Negative for other stridor or swelling Cardiovascular: Negative for other palpitations or other chest pain  Gastrointestinal: Negative for worsening diarrhea or loose stools, blood in stool, distention or other pain Genitourinary: Negative for hematuria, flank pain or other change in urine volume.  Musculoskeletal: Negative for myalgias or other joint swelling.  Skin: Negative for other color change, or other wound or worsening drainage.  Neurological: Negative for other syncope or numbness. Hematological: Negative for other adenopathy or swelling Psychiatric/Behavioral: Negative for hallucinations, other worsening agitation, SI, self-injury, or new decreased concentration All other system neg per pt    Objective:   Physical Exam BP 140/90   Pulse 78   Ht 4\' 11"  (1.499 m)   Wt 196 lb (88.9 kg)   SpO2 99%   BMI 39.59 kg/m  VS noted,  Constitutional: Pt is oriented to person, place, and time. Appears well-developed and well-nourished, in no significant distress and comfortable Head: Normocephalic and atraumatic  Eyes: Conjunctivae and EOM are normal. Pupils are equal, round, and reactive to light Right Ear: External ear normal without discharge Left Ear: External ear normal without discharge Nose: Nose without discharge or deformity Mouth/Throat: Oropharynx is without other ulcerations and moist  Neck: Normal range of motion. Neck supple. No JVD present. No tracheal deviation present or significant neck LA or mass Cardiovascular: Normal rate, regular rhythm, normal heart sounds and intact distal pulses.   Pulmonary/Chest: WOB normal and breath sounds without rales or wheezing  Abdominal: Soft. Bowel sounds are normal. NT. No HSM  Musculoskeletal: Normal range of motion. Exhibits no edema Lymphadenopathy: Has no other cervical adenopathy.  Neurological: Pt is alert and oriented to person, place, and time. Pt has normal reflexes. No cranial nerve deficit. Motor grossly  intact, Gait intact Skin: Skin is warm and dry. No rash noted or new ulcerations Psychiatric:  Has normal mood and affect. Behavior is normal without agitation No other exam findings Lab Results  Component Value Date   WBC 6.5 12/16/2015   HGB 13.2 12/16/2015   HCT 39.0 12/16/2015   PLT 260.0 12/16/2015   GLUCOSE 79 12/16/2015   CHOL 194 12/16/2015   TRIG 90.0 12/16/2015   HDL 54.90 12/16/2015   LDLDIRECT 134.6 05/23/2010   LDLCALC 121 (H) 12/16/2015   ALT 10 12/16/2015   AST 15 12/16/2015   NA 142 12/16/2015   K 3.9 12/16/2015   CL 105 12/16/2015   CREATININE 0.85 12/16/2015   BUN 18 12/16/2015   CO2 32 12/16/2015   TSH 1.59 07/01/2015   HGBA1C 5.9 12/16/2015   MICROALBUR 0.2 07/01/2015       Assessment & Plan:

## 2016-06-15 NOTE — Progress Notes (Signed)
Pre visit review using our clinic review tool, if applicable. No additional management support is needed unless otherwise documented below in the visit note. 

## 2016-07-04 ENCOUNTER — Other Ambulatory Visit: Payer: Self-pay | Admitting: Internal Medicine

## 2016-07-04 DIAGNOSIS — I739 Peripheral vascular disease, unspecified: Secondary | ICD-10-CM

## 2016-07-06 ENCOUNTER — Ambulatory Visit (HOSPITAL_COMMUNITY)
Admission: RE | Admit: 2016-07-06 | Discharge: 2016-07-06 | Disposition: A | Discharge status: Home / Self Care | Payer: Medicare Other | Source: Ambulatory Visit | Attending: Cardiovascular Disease | Admitting: Cardiovascular Disease

## 2016-07-06 DIAGNOSIS — I739 Peripheral vascular disease, unspecified: Secondary | ICD-10-CM | POA: Diagnosis not present

## 2016-07-09 ENCOUNTER — Other Ambulatory Visit: Payer: Self-pay | Admitting: Internal Medicine

## 2016-09-17 ENCOUNTER — Other Ambulatory Visit: Payer: Self-pay | Admitting: Internal Medicine

## 2016-11-12 NOTE — Progress Notes (Addendum)
Subjective:   Brandi Shaffer is a 71 y.o. female who presents for Medicare Annual (Subsequent) preventive examination.  Review of Systems:  No ROS.  Medicare Wellness Visit. Additional risk factors are reflected in the social history.  Cardiac Risk Factors include: advanced age (>64men, >68 women);diabetes mellitus;dyslipidemia;hypertension;obesity (BMI >30kg/m2) Sleep patterns: feels rested on waking, gets up 1 times nightly to void and sleeps 7-8 hours nightly.    Home Safety/Smoke Alarms: Feels safe in home. Smoke alarms in place.  Living environment; residence and Firearm Safety: 1-story house/ trailer, no firearms. Lives alone, no needs for DME, good support system Seat Belt Safety/Bike Helmet: Wears seat belt.     Objective:     Vitals: BP 126/74   Pulse 75   Resp 20   Ht 4\' 11"  (1.499 m)   Wt 193 lb (87.5 kg)   SpO2 98%   BMI 38.98 kg/m   Body mass index is 38.98 kg/m.   Tobacco History  Smoking Status  . Never Smoker  Smokeless Tobacco  . Never Used     Counseling given: Not Answered   Past Medical History:  Diagnosis Date  . ANEMIA-IRON DEFICIENCY 12/06/2009  . Anxiety state 07/01/2015  . Asthma 12/05/2011  . DIABETES MELLITUS, TYPE II 09/17/2006  . DIVERTICULOSIS, COLON 12/06/2009  . GENITAL HERPES 03/24/2010  . GERD (gastroesophageal reflux disease) 06/28/2011  . HYPERLIPIDEMIA 03/24/2010  . HYPERTENSION 09/17/2006  . HYPOTHYROIDISM 09/17/2006  . LIPOMA 12/06/2009  . PERIPHERAL EDEMA 03/24/2010  . Renal cyst 07/05/2011   1.9 cm minimally complex   Past Surgical History:  Procedure Laterality Date  . ABDOMINAL HYSTERECTOMY     fibroids  . ankle surgury     x 3 left - chronic pain/swelling  . SHOULDER SURGERY Left   . TONSILLECTOMY    . TUBAL LIGATION     Family History  Problem Relation Age of Onset  . Hypertension Mother   . Hypertension Father   . Heart disease Sister        sudden death early 42's  . Stroke Sister   . Prostate cancer  Brother        cancer  . Diabetes Other   . Joint hypermobility Other        DJD   History  Sexual Activity  . Sexual activity: Yes    Outpatient Encounter Prescriptions as of 11/14/2016  Medication Sig  . albuterol (PROAIR HFA) 108 (90 Base) MCG/ACT inhaler INHALE 2 PUFFS INTO THE LUNGS EVERY 6 (SIX) HOURS AS NEEDED FOR WHEEZING.  Marland Kitchen amLODipine (NORVASC) 10 MG tablet Take 1 tablet (10 mg total) by mouth daily.  Marland Kitchen aspirin 81 MG EC tablet TAKE 1 TABLET BY MOUTH DAILY. SWALLOW WHOLE.  Marland Kitchen Cholecalciferol (VITAMIN D) 400 UNITS tablet Take 400 Units by mouth daily.    . Coenzyme Q10 (COQ10) 100 MG capsule Take 100 mg by mouth daily.    Marland Kitchen ibuprofen (ADVIL,MOTRIN) 400 MG tablet TAKE 1 TABLET (400 MG TOTAL) BY MOUTH EVERY 6 (SIX) HOURS AS NEEDED FOR PAIN.  Marland Kitchen ipratropium-albuterol (DUONEB) 0.5-2.5 (3) MG/3ML SOLN USE 1 VIAL VIA NEBULIZER EVERY 6 HOURS AS NEEDED  . irbesartan-hydrochlorothiazide (AVALIDE) 300-12.5 MG tablet Take 1 tablet by mouth daily.  . Misc Natural Products (OSTEO BI-FLEX TRIPLE STRENGTH PO) Take by mouth.  . Multiple Vitamins-Minerals (WOMENS MULTIVITAMIN PO) Take 1 tablet by mouth daily.  Glory Rosebush DELICA LANCETS 35T MISC 1 each by Does not apply route 2 (two) times daily. Use to  help check blood sugars twice a day Dx E11.9  . ONETOUCH VERIO test strip USE 2 (TWO) TIMES DAILY. DX E11.9 *ONE TOUCH PER DR*  . pioglitazone (ACTOS) 15 MG tablet Take 1 tablet (15 mg total) by mouth daily.  . potassium chloride (KLOR-CON 10) 10 MEQ tablet Take 1 tablet (10 mEq total) by mouth daily.  . [DISCONTINUED] citalopram (CELEXA) 10 MG tablet Take 1 tablet (10 mg total) by mouth daily. (Patient not taking: Reported on 11/14/2016)  . [DISCONTINUED] Colloidal Oatmeal (EUCERIN ECZEMA RELIEF) 1 % CREA Use as directed to feet twice per day as needed (Patient not taking: Reported on 11/14/2016)  . [DISCONTINUED] irbesartan-hydrochlorothiazide (AVALIDE) 300-12.5 MG tablet TAKE 1 TABLET BY MOUTH  DAILY.  . [DISCONTINUED] rosuvastatin (CRESTOR) 10 MG tablet Take 1 tablet (10 mg total) by mouth daily. (Patient not taking: Reported on 11/14/2016)   No facility-administered encounter medications on file as of 11/14/2016.     Activities of Daily Living In your present state of health, do you have any difficulty performing the following activities: 11/14/2016  Hearing? N  Vision? N  Difficulty concentrating or making decisions? N  Walking or climbing stairs? N  Dressing or bathing? N  Doing errands, shopping? N  Preparing Food and eating ? N  Using the Toilet? N  In the past six months, have you accidently leaked urine? N  Do you have problems with loss of bowel control? N  Managing your Medications? N  Housekeeping or managing your Housekeeping? N  Some recent data might be hidden    Patient Care Team: Biagio Borg, MD as PCP - General    Assessment:    Physical assessment deferred to PCP.  Exercise Activities and Dietary recommendations Current Exercise Habits: Home exercise routine, Type of exercise: walking, Time (Minutes): 30, Frequency (Times/Week): 5, Weekly Exercise (Minutes/Week): 150, Intensity: Mild, Exercise limited by: orthopedic condition(s)  Diet (meal preparation, eat out, water intake, caffeinated beverages, dairy products, fruits and vegetables): in general, a "healthy" diet  , well balanced, eats a variety of fruits and vegetables daily, limits salt, fat/cholesterol, sugar, caffeine, drinks 6-8 glasses of water daily.  Reviewed heart healthy and diabetic diet Discussed weight loss tips and portion control.  Goals      Patient Stated   . patient (pt-stated)          De stress and enjoy her life      Other   . I am learning to take tough love measures with my daughter.           Spend time doing things for me, enjoy life, family and be carefree    . Weight < 200 lb (90.719 kg)          Walks a lot and prays./ get on machines;  Silver sneakers and  the to the gym Mid-morning; is good/ may try zumba;        Fall Risk Fall Risk  11/14/2016 12/16/2015 06/30/2015 12/15/2014 06/29/2014  Falls in the past year? No Yes No No No  Number falls in past yr: - 1 - - -  Injury with Fall? - Yes - - -  Comment - right shoulder dislocation - - -   Depression Screen PHQ 2/9 Scores 11/14/2016 06/15/2016 12/16/2015 06/30/2015  PHQ - 2 Score 0 1 0 0  PHQ- 9 Score 0 - - -     Cognitive Function MMSE - Mini Mental State Exam 11/14/2016 06/29/2014  Not completed: - Unable to  complete  Orientation to time 5 -  Orientation to Place 5 -  Registration 3 -  Attention/ Calculation 4 -  Recall 2 -  Language- name 2 objects 2 -  Language- repeat 1 -  Language- follow 3 step command 3 -  Language- read & follow direction 1 -  Write a sentence 1 -  Copy design 1 -  Total score 28 -        Immunization History  Administered Date(s) Administered  . Influenza Split 11/21/2010, 12/05/2011  . Influenza Whole 12/06/2009  . Influenza, High Dose Seasonal PF 12/04/2012, 12/04/2013, 11/14/2016  . Influenza,inj,Quad PF,6+ Mos 12/15/2014  . Pneumococcal Conjugate-13 12/19/2012  . Pneumococcal Polysaccharide-23 11/27/2004, 12/06/2009  . Td 11/27/2004, 06/08/2014   Screening Tests Health Maintenance  Topic Date Due  . PNA vac Low Risk Adult (2 of 2 - PPSV23) 12/07/2014  . INFLUENZA VACCINE  09/26/2016  . HEMOGLOBIN A1C  12/15/2016  . OPHTHALMOLOGY EXAM  01/30/2017  . FOOT EXAM  06/15/2017  . MAMMOGRAM  04/05/2018  . COLONOSCOPY  01/17/2020  . TETANUS/TDAP  06/07/2024  . DEXA SCAN  Completed  . Hepatitis C Screening  Completed      Plan:     Continue doing brain stimulating activities (puzzles, reading, adult coloring books, staying active) to keep memory sharp.   Continue to eat heart healthy diet (full of fruits, vegetables, whole grains, lean protein, water--limit salt, fat, and sugar intake) and increase physical activity as tolerated.  I have  personally reviewed and noted the following in the patient's chart:   . Medical and social history . Use of alcohol, tobacco or illicit drugs  . Current medications and supplements . Functional ability and status . Nutritional status . Physical activity . Advanced directives . List of other physicians . Vitals . Screenings to include cognitive, depression, and falls . Referrals and appointments  In addition, I have reviewed and discussed with patient certain preventive protocols, quality metrics, and best practice recommendations. A written personalized care plan for preventive services as well as general preventive health recommendations were provided to patient.     Michiel Cowboy, RN  11/14/2016  Medical screening examination/treatment/procedure(s) were performed by non-physician practitioner and as supervising physician I was immediately available for consultation/collaboration. I agree with above. Cathlean Cower, MD

## 2016-11-12 NOTE — Progress Notes (Signed)
Pre visit review using our clinic review tool, if applicable. No additional management support is needed unless otherwise documented below in the visit note. 

## 2016-11-14 ENCOUNTER — Ambulatory Visit (INDEPENDENT_AMBULATORY_CARE_PROVIDER_SITE_OTHER): Payer: Medicare Other | Admitting: *Deleted

## 2016-11-14 VITALS — BP 126/74 | HR 75 | Resp 20 | Ht 59.0 in | Wt 193.0 lb

## 2016-11-14 DIAGNOSIS — Z0000 Encounter for general adult medical examination without abnormal findings: Secondary | ICD-10-CM

## 2016-11-14 DIAGNOSIS — Z23 Encounter for immunization: Secondary | ICD-10-CM | POA: Diagnosis not present

## 2016-11-14 NOTE — Patient Instructions (Addendum)
Continue doing brain stimulating activities (puzzles, reading, adult coloring books, staying active) to keep memory sharp.   Continue to eat heart healthy diet (full of fruits, vegetables, whole grains, lean protein, water--limit salt, fat, and sugar intake) and increase physical activity as tolerated.   [name] , Thank you for taking time to come for your Medicare Wellness Visit. I appreciate your ongoing commitment to your health goals. Please review the following plan we discussed and let me know if I can assist you in the future.   These are the goals we discussed: Goals      Patient Stated   . patient (pt-stated)          De stress and enjoy her life      Other   . I am learning to take tough love measures with my daughter.           Spend time doing things for me, enjoy life, family and be carefree    . Weight < 200 lb (90.719 kg)          Walks a lot and prays./ get on machines;  Silver sneakers and the to the gym Mid-morning; is good/ may try zumba;         This is a list of the screening recommended for you and due dates:  Health Maintenance  Topic Date Due  . Pneumonia vaccines (2 of 2 - PPSV23) 12/07/2014  . Flu Shot  09/26/2016  . Hemoglobin A1C  12/15/2016  . Eye exam for diabetics  01/30/2017  . Complete foot exam   06/15/2017  . Mammogram  04/05/2018  . Colon Cancer Screening  01/17/2020  . Tetanus Vaccine  06/07/2024  . DEXA scan (bone density measurement)  Completed  .  Hepatitis C: One time screening is recommended by Center for Disease Control  (CDC) for  adults born from [birth year] through 1966.   Completed   Influenza Virus Vaccine injection What is this medicine? INFLUENZA VIRUS VACCINE (in floo EN zuh VAHY ruhs vak SEEN) helps to reduce the risk of getting influenza also known as the flu. The vaccine only helps protect you against some strains of the flu. This medicine may be used for other purposes; ask your health care provider or pharmacist if you  have questions. COMMON BRAND NAME(S): Afluria, Agriflu, Alfuria, FLUAD, Fluarix, Fluarix Quadrivalent, Flublok, Flublok Quadrivalent, FLUCELVAX, Flulaval, Fluvirin, Fluzone, Fluzone High-Dose, Fluzone Intradermal What should I tell my health care provider before I take this medicine? They need to know if you have any of these conditions: -bleeding disorder like hemophilia -fever or infection -Guillain-Barre syndrome or other neurological problems -immune system problems -infection with the human immunodeficiency virus (HIV) or AIDS -low blood platelet counts -multiple sclerosis -an unusual or allergic reaction to influenza virus vaccine, latex, other medicines, foods, dyes, or preservatives. Different brands of vaccines contain different allergens. Some may contain latex or eggs. Talk to your doctor about your allergies to make sure that you get the right vaccine. -pregnant or trying to get pregnant -breast-feeding How should I use this medicine? This vaccine is for injection into a muscle or under the skin. It is given by a health care professional. A copy of Vaccine Information Statements will be given before each vaccination. Read this sheet carefully each time. The sheet may change frequently. Talk to your healthcare provider to see which vaccines are right for you. Some vaccines should not be used in all age groups. Overdosage: If you think you  have taken too much of this medicine contact a poison control center or emergency room at once. NOTE: This medicine is only for you. Do not share this medicine with others. What if I miss a dose? This does not apply. What may interact with this medicine? -chemotherapy or radiation therapy -medicines that lower your immune system like etanercept, anakinra, infliximab, and adalimumab -medicines that treat or prevent blood clots like warfarin -phenytoin -steroid medicines like prednisone or cortisone -theophylline -vaccines This list may not  describe all possible interactions. Give your health care provider a list of all the medicines, herbs, non-prescription drugs, or dietary supplements you use. Also tell them if you smoke, drink alcohol, or use illegal drugs. Some items may interact with your medicine. What should I watch for while using this medicine? Report any side effects that do not go away within 3 days to your doctor or health care professional. Call your health care provider if any unusual symptoms occur within 6 weeks of receiving this vaccine. You may still catch the flu, but the illness is not usually as bad. You cannot get the flu from the vaccine. The vaccine will not protect against colds or other illnesses that may cause fever. The vaccine is needed every year. What side effects may I notice from receiving this medicine? Side effects that you should report to your doctor or health care professional as soon as possible: -allergic reactions like skin rash, itching or hives, swelling of the face, lips, or tongue Side effects that usually do not require medical attention (report to your doctor or health care professional if they continue or are bothersome): -fever -headache -muscle aches and pains -pain, tenderness, redness, or swelling at the injection site -tiredness This list may not describe all possible side effects. Call your doctor for medical advice about side effects. You may report side effects to FDA at 1-800-FDA-1088. Where should I keep my medicine? The vaccine will be given by a health care professional in a clinic, pharmacy, doctor's office, or other health care setting. You will not be given vaccine doses to store at home. NOTE: This sheet is a summary. It may not cover all possible information. If you have questions about this medicine, talk to your doctor, pharmacist, or health care provider.  2018 Elsevier/Gold Standard (2014-09-03 10:07:28)

## 2016-11-29 ENCOUNTER — Ambulatory Visit (INDEPENDENT_AMBULATORY_CARE_PROVIDER_SITE_OTHER): Payer: Medicare Other | Admitting: Podiatry

## 2016-11-29 ENCOUNTER — Encounter: Payer: Self-pay | Admitting: Podiatry

## 2016-11-29 DIAGNOSIS — B351 Tinea unguium: Secondary | ICD-10-CM | POA: Diagnosis not present

## 2016-11-29 NOTE — Progress Notes (Signed)
Subjective:    Patient ID: Brandi Shaffer, female   DOB: 71 y.o.   MRN: [id number]   HPI patient presents concerned because of nail discoloration left fifth nail and several other nails left that are mildly discomfort    ROS      Objective:  Physical Exam neurovascular status unchanged with nail disease left fifth nail and moderate in the second and third     Assessment:     Probable localized mycotic infection with possibility for trauma    Plan:    H&P reviewed condition with patient and do not recommend treatment as they are not sore they're thick but I do not see that this is a true fungal issue even though it's possible for saw him versus trauma

## 2016-12-07 ENCOUNTER — Other Ambulatory Visit: Payer: Self-pay | Admitting: Internal Medicine

## 2016-12-07 NOTE — Telephone Encounter (Signed)
Done erx 

## 2016-12-25 ENCOUNTER — Encounter: Payer: Self-pay | Admitting: Internal Medicine

## 2016-12-25 ENCOUNTER — Other Ambulatory Visit (INDEPENDENT_AMBULATORY_CARE_PROVIDER_SITE_OTHER): Payer: Medicare Other

## 2016-12-25 ENCOUNTER — Ambulatory Visit (INDEPENDENT_AMBULATORY_CARE_PROVIDER_SITE_OTHER): Payer: Medicare Other | Admitting: Internal Medicine

## 2016-12-25 VITALS — BP 124/64 | HR 81 | Temp 97.9°F | Resp 16 | Wt 195.2 lb

## 2016-12-25 DIAGNOSIS — E119 Type 2 diabetes mellitus without complications: Secondary | ICD-10-CM

## 2016-12-25 DIAGNOSIS — E785 Hyperlipidemia, unspecified: Secondary | ICD-10-CM | POA: Diagnosis not present

## 2016-12-25 DIAGNOSIS — I10 Essential (primary) hypertension: Secondary | ICD-10-CM | POA: Diagnosis not present

## 2016-12-25 DIAGNOSIS — Z0000 Encounter for general adult medical examination without abnormal findings: Secondary | ICD-10-CM

## 2016-12-25 DIAGNOSIS — I350 Nonrheumatic aortic (valve) stenosis: Secondary | ICD-10-CM | POA: Diagnosis not present

## 2016-12-25 LAB — HEPATIC FUNCTION PANEL
ALT: 12 U/L (ref 0–35)
AST: 18 U/L (ref 0–37)
Albumin: 4.3 g/dL (ref 3.5–5.2)
Alkaline Phosphatase: 119 U/L — ABNORMAL HIGH (ref 39–117)
Bilirubin, Direct: 0.1 mg/dL (ref 0.0–0.3)
Total Bilirubin: 0.6 mg/dL (ref 0.2–1.2)
Total Protein: 7 g/dL (ref 6.0–8.3)

## 2016-12-25 LAB — BASIC METABOLIC PANEL
BUN: 19 mg/dL (ref 6–23)
CO2: 29 mEq/L (ref 19–32)
Calcium: 9.6 mg/dL (ref 8.4–10.5)
Chloride: 104 mEq/L (ref 96–112)
Creatinine, Ser: 0.88 mg/dL (ref 0.40–1.20)
GFR: 81.23 mL/min (ref >60.00)
Glucose, Bld: 88 mg/dL (ref 70–99)
Potassium: 3.9 mEq/L (ref 3.5–5.1)
Sodium: 142 mEq/L (ref 135–145)

## 2016-12-25 LAB — LIPID PANEL
Cholesterol: 170 mg/dL (ref 0–200)
HDL: 54.1 mg/dL (ref >39.00)
LDL Cholesterol: 100 mg/dL — ABNORMAL HIGH (ref 0–99)
NonHDL: 116.07
Total CHOL/HDL Ratio: 3
Triglycerides: 82 mg/dL (ref 0.0–149.0)
VLDL: 16.4 mg/dL (ref 0.0–40.0)

## 2016-12-25 LAB — HEMOGLOBIN A1C: Hgb A1c MFr Bld: 5.9 % (ref 4.6–6.5)

## 2016-12-25 NOTE — Patient Instructions (Signed)
Please continue all other medications as before, and refills have been done if requested.  Please have the pharmacy call with any other refills you may need.  Please continue your efforts at being more active, low cholesterol diet, and weight control.  You are otherwise up to date with prevention measures today.  Please keep your appointments with your specialists as you may have planned  You will be contacted regarding the referral for: Echocardiogram  Please go to the LAB in the Basement (turn left off the elevator) for the tests to be done today  You will be contacted by phone if any changes need to be made immediately.  Otherwise, you will receive a letter about your results with an explanation, but please check with MyChart first.  Please remember to sign up for MyChart if you have not done so, as this will be important to you in the future with finding out test results, communicating by private email, and scheduling acute appointments online when needed.  If you have Medicare related insurance (such as traditional Medicare, Blue [employer] or [employer], or similar), Please make an appointment at the [facility] with Sharee Pimple, the [employer], for your Wellness Visit in this office, which is a benefit with your insurance.  Please return in 6 months, or sooner if needed, with Lab testing done 3-5 days before

## 2016-12-25 NOTE — Progress Notes (Signed)
Subjective:    Patient ID: Brandi Shaffer, female    DOB: [birth year], 71 y.o.   MRN: [id number]  HPI  Here to f/u; overall doing ok,  Pt denies chest pain, increasing sob or doe, wheezing, orthopnea, PND, increased LE swelling, palpitations, dizziness or syncope.  Pt denies new neurological symptoms such as new headache, or facial or extremity weakness or numbness.  Pt denies polydipsia, polyuria, or low sugar episode.  Pt states overall good compliance with meds, mostly trying to follow appropriate diet, with wt overall stable,  but little exercise however. No interval hx BP Readings from Last 3 Encounters:  12/25/16 124/64  11/14/16 126/74  06/15/16 140/90   Wt Readings from Last 3 Encounters:  12/25/16 195 lb 4 oz (88.6 kg)  11/14/16 193 lb (87.5 kg)  06/15/16 196 lb (88.9 kg)   Past Medical History:  Diagnosis Date  . ANEMIA-IRON DEFICIENCY 12/06/2009  . Anxiety state 07/01/2015  . Asthma 12/05/2011  . DIABETES MELLITUS, TYPE II 09/17/2006  . DIVERTICULOSIS, COLON 12/06/2009  . GENITAL HERPES 03/24/2010  . GERD (gastroesophageal reflux disease) 06/28/2011  . HYPERLIPIDEMIA 03/24/2010  . HYPERTENSION 09/17/2006  . HYPOTHYROIDISM 09/17/2006  . LIPOMA 12/06/2009  . PERIPHERAL EDEMA 03/24/2010  . Renal cyst 07/05/2011   1.9 cm minimally complex   Past Surgical History:  Procedure Laterality Date  . ABDOMINAL HYSTERECTOMY     fibroids  . ankle surgury     x 3 left - chronic pain/swelling  . SHOULDER SURGERY Left   . TONSILLECTOMY    . TUBAL LIGATION      reports that she has never smoked. She has never used smokeless tobacco. She reports that she does not drink alcohol or use drugs. family history includes Diabetes in her other; Heart disease in her sister; Hypertension in her father and mother; Joint hypermobility in her other; Prostate cancer in her brother; Stroke in her sister. Allergies  Allergen Reactions  . Januvia [Sitagliptin Phosphate]     dizziness  . Metformin And  Related Nausea Only  . Doxycycline Rash   Current Outpatient Prescriptions on File Prior to Visit  Medication Sig Dispense Refill  . albuterol (PROAIR HFA) 108 (90 Base) MCG/ACT inhaler INHALE 2 PUFFS INTO THE LUNGS EVERY 6 (SIX) HOURS AS NEEDED FOR WHEEZING. 8.5 Inhaler 5  . amLODipine (NORVASC) 10 MG tablet Take 1 tablet (10 mg total) by mouth daily. 90 tablet 3  . aspirin 81 MG EC tablet TAKE 1 TABLET BY MOUTH DAILY. SWALLOW WHOLE. 30 tablet 11  . Coenzyme Q10 (COQ10) 100 MG capsule Take 100 mg by mouth daily.      Marland Kitchen ibuprofen (ADVIL,MOTRIN) 400 MG tablet TAKE 1 TABLET (400 MG TOTAL) BY MOUTH EVERY 6 (SIX) HOURS AS NEEDED FOR PAIN. 60 tablet 5  . ipratropium-albuterol (DUONEB) 0.5-2.5 (3) MG/3ML SOLN USE 1 VIAL VIA NEBULIZER EVERY 6 HOURS AS NEEDED 360 mL 2  . irbesartan-hydrochlorothiazide (AVALIDE) 300-12.5 MG tablet Take 1 tablet by mouth daily. 90 tablet 3  . Misc Natural Products (OSTEO BI-FLEX TRIPLE STRENGTH PO) Take by mouth.    . Multiple Vitamins-Minerals (WOMENS MULTIVITAMIN PO) Take 1 tablet by mouth daily.    Letta Pate DELICA LANCETS 33G MISC 1 each by Does not apply route 2 (two) times daily. Use to help check blood sugars twice a day Dx E11.9 200 each 11  . ONETOUCH VERIO test strip USE 2 (TWO) TIMES DAILY. DX E11.9 *ONE TOUCH PER DR* 100 each 4  .  pioglitazone (ACTOS) 15 MG tablet Take 1 tablet (15 mg total) by mouth daily. 90 tablet 3  . potassium chloride (KLOR-CON 10) 10 MEQ tablet Take 1 tablet (10 mEq total) by mouth daily. 90 tablet 3  . Cholecalciferol (VITAMIN D) 400 UNITS tablet Take 400 Units by mouth daily.      . citalopram (CELEXA) 10 MG tablet TAKE 1 TABLET (10 MG TOTAL) BY MOUTH DAILY. (Patient not taking: Reported on 12/25/2016) 90 tablet 1  . rosuvastatin (CRESTOR) 10 MG tablet TAKE 1 TABLET (10 MG TOTAL) BY MOUTH DAILY. (Patient not taking: Reported on 12/25/2016) 90 tablet 1   No current facility-administered medications on file prior to visit.     Review of Systems  Constitutional: Negative for other unusual diaphoresis or sweats HENT: Negative for ear discharge or swelling Eyes: Negative for other worsening visual disturbances Respiratory: Negative for stridor or other swelling  Gastrointestinal: Negative for worsening distension or other blood Genitourinary: Negative for retention or other urinary change Musculoskeletal: Negative for other MSK pain or swelling Skin: Negative for color change or other new lesions Neurological: Negative for worsening tremors and other numbness  Psychiatric/Behavioral: Negative for worsening agitation or other fatigue All other system neg per pt    Objective:   Physical Exam BP 124/64 (BP Location: Left Arm, Patient Position: Sitting, Cuff Size: Large)   Pulse 81   Temp 97.9 F (36.6 C) (Oral)   Resp 16   Wt 195 lb 4 oz (88.6 kg)   SpO2 96%   BMI 39.44 kg/m  VS noted,  Constitutional: Pt appears in NAD HENT: Head: NCAT.  Right Ear: External ear normal.  Left Ear: External ear normal.  Eyes: . Pupils are equal, round, and reactive to light. Conjunctivae and EOM are normal Nose: without d/c or deformity Neck: Neck supple. Gross normal ROM Cardiovascular: Normal rate and regular rhythm.  with gr 2/6 SYS murmur RUSB Pulmonary/Chest: Effort normal and breath sounds without rales or wheezing.  Abd:  Soft, NT, ND, + BS, no organomegaly Neurological: Pt is alert. At baseline orientation, motor grossly intact Skin: Skin is warm. No rashes, other new lesions, trace ankle LE edema Psychiatric: Pt behavior is normal without agitation  No other exam findings     Lab Results  Component Value Date   WBC 5.5 06/15/2016   HGB 13.2 06/15/2016   HCT 40.1 06/15/2016   PLT 244.0 06/15/2016   GLUCOSE 96 06/15/2016   CHOL 190 06/15/2016   TRIG 89.0 06/15/2016   HDL 58.40 06/15/2016   LDLDIRECT 134.6 05/23/2010   LDLCALC 114 (H) 06/15/2016   ALT 12 06/15/2016   AST 17 06/15/2016   NA 141  06/15/2016   K 3.9 06/15/2016   CL 105 06/15/2016   CREATININE 0.92 06/15/2016   BUN 19 06/15/2016   CO2 29 06/15/2016   TSH 1.95 06/15/2016   HGBA1C 5.9 06/15/2016   MICROALBUR 0.7 06/15/2016       Assessment & Plan:

## 2016-12-28 NOTE — Assessment & Plan Note (Signed)
With ? Worsening murmur - for f/u echo

## 2016-12-28 NOTE — Assessment & Plan Note (Signed)
stable overall by history and exam, recent data reviewed with pt, and pt to continue medical treatment as before,  to f/u any worsening symptoms or concerns Lab Results  Component Value Date   HGBA1C 5.9 12/25/2016

## 2016-12-28 NOTE — Assessment & Plan Note (Signed)
stable overall by history and exam, recent data reviewed with pt, and pt to continue medical treatment as before,  to f/u any worsening symptoms or concerns BP Readings from Last 3 Encounters:  12/25/16 124/64  11/14/16 126/74  06/15/16 140/90

## 2016-12-28 NOTE — Assessment & Plan Note (Signed)
stable overall by history and exam, recent data reviewed with pt, and pt to continue medical treatment as before,  to f/u any worsening symptoms or concerns Lab Results  Component Value Date   LDLCALC 100 (H) 12/25/2016

## 2017-01-01 ENCOUNTER — Ambulatory Visit (HOSPITAL_COMMUNITY): Payer: Medicare Other | Attending: Internal Medicine

## 2017-01-01 ENCOUNTER — Other Ambulatory Visit: Payer: Self-pay

## 2017-01-01 ENCOUNTER — Ambulatory Visit (INDEPENDENT_AMBULATORY_CARE_PROVIDER_SITE_OTHER): Payer: Medicare Other | Admitting: Internal Medicine

## 2017-01-01 ENCOUNTER — Encounter: Payer: Self-pay | Admitting: Internal Medicine

## 2017-01-01 VITALS — BP 126/84 | HR 84 | Temp 97.9°F | Ht 59.0 in | Wt 198.0 lb

## 2017-01-01 DIAGNOSIS — D649 Anemia, unspecified: Secondary | ICD-10-CM | POA: Diagnosis not present

## 2017-01-01 DIAGNOSIS — I10 Essential (primary) hypertension: Secondary | ICD-10-CM | POA: Diagnosis not present

## 2017-01-01 DIAGNOSIS — E039 Hypothyroidism, unspecified: Secondary | ICD-10-CM | POA: Diagnosis not present

## 2017-01-01 DIAGNOSIS — E785 Hyperlipidemia, unspecified: Secondary | ICD-10-CM | POA: Insufficient documentation

## 2017-01-01 DIAGNOSIS — E119 Type 2 diabetes mellitus without complications: Secondary | ICD-10-CM | POA: Diagnosis not present

## 2017-01-01 DIAGNOSIS — I350 Nonrheumatic aortic (valve) stenosis: Secondary | ICD-10-CM | POA: Insufficient documentation

## 2017-01-01 DIAGNOSIS — J45909 Unspecified asthma, uncomplicated: Secondary | ICD-10-CM | POA: Insufficient documentation

## 2017-01-01 DIAGNOSIS — J069 Acute upper respiratory infection, unspecified: Secondary | ICD-10-CM | POA: Diagnosis not present

## 2017-01-01 LAB — ECHOCARDIOGRAM COMPLETE
Height: 59 in
Weight: 3168 oz

## 2017-01-01 MED ORDER — AMOXICILLIN 500 MG PO CAPS: 1000.0000 mg | ORAL_CAPSULE | Freq: Two times a day (BID) | ORAL | 0 refills | Status: DC

## 2017-01-01 NOTE — Patient Instructions (Addendum)
Please take all new medication as prescribed - the antibiotic  Please continue all other medications as before, and refills have been done if requested.  Please have the pharmacy call with any other refills you may need.  Please keep your appointments with your specialists as you may have planned   

## 2017-01-01 NOTE — Progress Notes (Signed)
Subjective:    Patient ID: Brandi Shaffer, female    DOB: 09-11-1945, 71 y.o.   MRN: [id number]  HPI   Here with 2-3 days acute onset fever, facial pain, pressure, headache, general weakness and malaise, and greenish d/c, with mild ST and cough, but pt denies chest pain, wheezing, increased sob or doe, orthopnea, PND, increased LE swelling, palpitations, dizziness or syncope.  Pt denies new neurological symptoms such as new headache, or facial or extremity weakness or numbness   Pt denies polydipsia, polyuria Past Medical History:  Diagnosis Date  . ANEMIA-IRON DEFICIENCY 12/06/2009  . Anxiety state 07/01/2015  . Asthma 12/05/2011  . DIABETES MELLITUS, TYPE II 09/17/2006  . DIVERTICULOSIS, COLON 12/06/2009  . GENITAL HERPES 03/24/2010  . GERD (gastroesophageal reflux disease) 06/28/2011  . HYPERLIPIDEMIA 03/24/2010  . HYPERTENSION 09/17/2006  . HYPOTHYROIDISM 09/17/2006  . LIPOMA 12/06/2009  . PERIPHERAL EDEMA 03/24/2010  . Renal cyst 07/05/2011   1.9 cm minimally complex   Past Surgical History:  Procedure Laterality Date  . ABDOMINAL HYSTERECTOMY     fibroids  . ankle surgury     x 3 left - chronic pain/swelling  . SHOULDER SURGERY Left   . TONSILLECTOMY    . TUBAL LIGATION      reports that  has never smoked. she has never used smokeless tobacco. She reports that she does not drink alcohol or use drugs. family history includes Diabetes in her other; Heart disease in her sister; Hypertension in her father and mother; Joint hypermobility in her other; Prostate cancer in her brother; Stroke in her sister. Allergies  Allergen Reactions  . Januvia [Sitagliptin Phosphate]     dizziness  . Metformin And Related Nausea Only  . Doxycycline Rash   Current Outpatient Medications on File Prior to Visit  Medication Sig Dispense Refill  . albuterol (PROAIR HFA) 108 (90 Base) MCG/ACT inhaler INHALE 2 PUFFS INTO THE LUNGS EVERY 6 (SIX) HOURS AS NEEDED FOR WHEEZING. 8.5 Inhaler 5  . amLODipine  (NORVASC) 10 MG tablet Take 1 tablet (10 mg total) by mouth daily. 90 tablet 3  . aspirin 81 MG EC tablet TAKE 1 TABLET BY MOUTH DAILY. SWALLOW WHOLE. 30 tablet 11  . Cholecalciferol (VITAMIN D) 400 UNITS tablet Take 400 Units by mouth daily.      . citalopram (CELEXA) 10 MG tablet TAKE 1 TABLET (10 MG TOTAL) BY MOUTH DAILY. 90 tablet 1  . Coenzyme Q10 (COQ10) 100 MG capsule Take 100 mg by mouth daily.      Marland Kitchen ibuprofen (ADVIL,MOTRIN) 400 MG tablet TAKE 1 TABLET (400 MG TOTAL) BY MOUTH EVERY 6 (SIX) HOURS AS NEEDED FOR PAIN. 60 tablet 5  . ipratropium-albuterol (DUONEB) 0.5-2.5 (3) MG/3ML SOLN USE 1 VIAL VIA NEBULIZER EVERY 6 HOURS AS NEEDED 360 mL 2  . irbesartan-hydrochlorothiazide (AVALIDE) 300-12.5 MG tablet Take 1 tablet by mouth daily. 90 tablet 3  . Misc Natural Products (OSTEO BI-FLEX TRIPLE STRENGTH PO) Take by mouth.    . Multiple Vitamins-Minerals (WOMENS MULTIVITAMIN PO) Take 1 tablet by mouth daily.    Letta Pate DELICA LANCETS 33G MISC 1 each by Does not apply route 2 (two) times daily. Use to help check blood sugars twice a day Dx E11.9 200 each 11  . ONETOUCH VERIO test strip USE 2 (TWO) TIMES DAILY. DX E11.9 *ONE TOUCH PER DR* 100 each 4  . pioglitazone (ACTOS) 15 MG tablet Take 1 tablet (15 mg total) by mouth daily. 90 tablet 3  .  potassium chloride (KLOR-CON 10) 10 MEQ tablet Take 1 tablet (10 mEq total) by mouth daily. 90 tablet 3  . rosuvastatin (CRESTOR) 10 MG tablet TAKE 1 TABLET (10 MG TOTAL) BY MOUTH DAILY. 90 tablet 1   No current facility-administered medications on file prior to visit.    Review of Systems  Constitutional: Negative for other unusual diaphoresis or sweats HENT: Negative for ear discharge or swelling Eyes: Negative for other worsening visual disturbances Respiratory: Negative for stridor or other swelling  Gastrointestinal: Negative for worsening distension or other blood Genitourinary: Negative for retention or other urinary change Musculoskeletal:  Negative for other MSK pain or swelling Skin: Negative for color change or other new lesions Neurological: Negative for worsening tremors and other numbness  Psychiatric/Behavioral: Negative for worsening agitation or other fatigue All other system neg per pt    Objective:   Physical Exam BP 126/84   Pulse 84   Temp 97.9 F (36.6 C) (Oral)   Ht 4\' 11"  (1.499 m)   Wt 198 lb (89.8 kg)   SpO2 99%   BMI 39.99 kg/m  VS noted,  Mild ill Constitutional: Pt appears in NAD HENT: Head: NCAT.  Right Ear: External ear normal.  Left Ear: External ear normal.  Bilat tm's with mild erythema.  Max sinus areas mild tender.  Pharynx with mild erythema, no exudate Eyes: . Pupils are equal, round, and reactive to light. Conjunctivae and EOM are normal Nose: without d/c or deformity Neck: Neck supple. Gross normal ROM Cardiovascular: Normal rate and regular rhythm.   Pulmonary/Chest: Effort normal and breath sounds without rales or wheezing.  Neurological: Pt is alert. At baseline orientation, motor grossly intact Skin: Skin is warm. No rashes, other new lesions, no LE edema Psychiatric: Pt behavior is normal without agitation  No other exam findings    Assessment & Plan:

## 2017-01-04 DIAGNOSIS — J069 Acute upper respiratory infection, unspecified: Secondary | ICD-10-CM | POA: Insufficient documentation

## 2017-01-04 NOTE — Assessment & Plan Note (Signed)
stable overall by history and exam, recent data reviewed with pt, and pt to continue medical treatment as before,  to f/u any worsening symptoms or concerns Lab Results  Component Value Date   HGBA1C 5.9 12/25/2016

## 2017-01-04 NOTE — Assessment & Plan Note (Signed)
Mild to mod, for antibx course,  to f/u any worsening symptoms or concerns 

## 2017-01-04 NOTE — Assessment & Plan Note (Signed)
stable overall by history and exam, recent data reviewed with pt, and pt to continue medical treatment as before,  to f/u any worsening symptoms or concerns BP Readings from Last 3 Encounters:  01/01/17 126/84  12/25/16 124/64  11/14/16 126/74

## 2017-02-13 DIAGNOSIS — H43812 Vitreous degeneration, left eye: Secondary | ICD-10-CM | POA: Diagnosis not present

## 2017-02-13 DIAGNOSIS — H43811 Vitreous degeneration, right eye: Secondary | ICD-10-CM | POA: Diagnosis not present

## 2017-02-13 DIAGNOSIS — H2511 Age-related nuclear cataract, right eye: Secondary | ICD-10-CM | POA: Diagnosis not present

## 2017-02-13 DIAGNOSIS — E119 Type 2 diabetes mellitus without complications: Secondary | ICD-10-CM | POA: Diagnosis not present

## 2017-02-13 LAB — HM DIABETES EYE EXAM

## 2017-04-01 ENCOUNTER — Telehealth: Payer: Self-pay | Admitting: Internal Medicine

## 2017-04-01 MED ORDER — LOSARTAN POTASSIUM-HCTZ 100-12.5 MG PO TABS: 1.0000 | ORAL_TABLET | Freq: Every day | ORAL | 3 refills | Status: DC

## 2017-04-01 NOTE — Telephone Encounter (Signed)
Copied from [place]. Topic: Quick Communication - See Telephone Encounter >> Apr 01, 2017  2:42 PM Burnis Medin, NT wrote: CRM for notification. See Telephone encounter for: Patient is calling because she received a letter in the mail that irbesartan-hydrochlorothiazide (AVALIDE) 300-12.5 MG tablet have a recall and wanted to see what the doctor wanted her to do. Pt would like a call back  04/01/17.

## 2017-04-01 NOTE — Telephone Encounter (Signed)
Ok to change the irbesartan HCT to losartan HCT which is similar - done erx

## 2017-04-02 NOTE — Telephone Encounter (Signed)
Notified pt w/MD response.../lmb 

## 2017-04-09 ENCOUNTER — Other Ambulatory Visit: Payer: Self-pay | Admitting: Internal Medicine

## 2017-04-09 DIAGNOSIS — Z139 Encounter for screening, unspecified: Secondary | ICD-10-CM

## 2017-04-29 ENCOUNTER — Ambulatory Visit
Admission: RE | Admit: 2017-04-29 | Discharge: 2017-04-29 | Disposition: A | Discharge status: Home / Self Care | Payer: Medicare Other | Source: Ambulatory Visit | Attending: Internal Medicine | Admitting: Internal Medicine

## 2017-04-29 DIAGNOSIS — Z1231 Encounter for screening mammogram for malignant neoplasm of breast: Secondary | ICD-10-CM | POA: Diagnosis not present

## 2017-04-29 DIAGNOSIS — Z139 Encounter for screening, unspecified: Secondary | ICD-10-CM

## 2017-05-14 ENCOUNTER — Telehealth: Payer: Self-pay | Admitting: Internal Medicine

## 2017-05-14 NOTE — Telephone Encounter (Signed)
Copied from [place] [id number]. Topic: Quick Communication - Rx Refill/Question >> May 14, 2017  2:11 PM Margot Ables wrote: Medication: losartan is back on the recall list - received letter from pharmacy - please change med order - please notify pt Has the patient contacted their pharmacy? Yes.   Preferred Pharmacy (with phone number or street name): [id number] - [place], [place] [phone] (Phone) [phone] (Fax)

## 2017-05-14 NOTE — Telephone Encounter (Signed)
MD is out of the office pls advise../lmb 

## 2017-05-15 MED ORDER — OLMESARTAN MEDOXOMIL-HCTZ 20-12.5 MG PO TABS: 1.0000 | ORAL_TABLET | Freq: Every day | ORAL | 3 refills | Status: DC

## 2017-05-15 NOTE — Telephone Encounter (Signed)
Ok Olmesartan HCT - see Rx Thx

## 2017-05-15 NOTE — Telephone Encounter (Signed)
Notified pt w/med change sent to [facility]../lmb

## 2017-06-01 ENCOUNTER — Other Ambulatory Visit: Payer: Self-pay | Admitting: Internal Medicine

## 2017-06-25 ENCOUNTER — Encounter: Payer: Self-pay | Admitting: Internal Medicine

## 2017-06-25 ENCOUNTER — Ambulatory Visit (INDEPENDENT_AMBULATORY_CARE_PROVIDER_SITE_OTHER): Payer: Medicare Other | Admitting: Internal Medicine

## 2017-06-25 ENCOUNTER — Other Ambulatory Visit (INDEPENDENT_AMBULATORY_CARE_PROVIDER_SITE_OTHER): Payer: Medicare Other

## 2017-06-25 VITALS — BP 124/82 | HR 89 | Temp 97.8°F | Ht 59.0 in | Wt 192.0 lb

## 2017-06-25 DIAGNOSIS — E119 Type 2 diabetes mellitus without complications: Secondary | ICD-10-CM

## 2017-06-25 DIAGNOSIS — Z23 Encounter for immunization: Secondary | ICD-10-CM | POA: Diagnosis not present

## 2017-06-25 DIAGNOSIS — Z0000 Encounter for general adult medical examination without abnormal findings: Secondary | ICD-10-CM

## 2017-06-25 LAB — MICROALBUMIN / CREATININE URINE RATIO
Creatinine,U: 109.5 mg/dL
Microalb Creat Ratio: 0.7 mg/g (ref 0.0–30.0)
Microalb, Ur: 0.8 mg/dL (ref 0.0–1.9)

## 2017-06-25 LAB — CBC WITH DIFFERENTIAL/PLATELET
Basophils Absolute: 0 10*3/uL (ref 0.0–0.1)
Basophils Relative: 0.9 % (ref 0.0–3.0)
Eosinophils Absolute: 0.2 10*3/uL (ref 0.0–0.7)
Eosinophils Relative: 3.4 % (ref 0.0–5.0)
HCT: 39.8 % (ref 36.0–46.0)
Hemoglobin: 13.3 g/dL (ref 12.0–15.0)
Lymphocytes Relative: 43.7 % (ref 12.0–46.0)
Lymphs Abs: 2.5 10*3/uL (ref 0.7–4.0)
MCHC: 33.3 g/dL (ref 30.0–36.0)
MCV: 91.3 fl (ref 78.0–100.0)
Monocytes Absolute: 0.5 10*3/uL (ref 0.1–1.0)
Monocytes Relative: 9.7 % (ref 3.0–12.0)
Neutro Abs: 2.4 10*3/uL (ref 1.4–7.7)
Neutrophils Relative %: 42.3 % — ABNORMAL LOW (ref 43.0–77.0)
Platelets: 243 10*3/uL (ref 150.0–400.0)
RBC: 4.36 Mil/uL (ref 3.87–5.11)
RDW: 14.4 % (ref 11.5–15.5)
WBC: 5.6 10*3/uL (ref 4.0–10.5)

## 2017-06-25 LAB — BASIC METABOLIC PANEL
BUN: 19 mg/dL (ref 6–23)
CO2: 28 mEq/L (ref 19–32)
Calcium: 9.4 mg/dL (ref 8.4–10.5)
Chloride: 107 mEq/L (ref 96–112)
Creatinine, Ser: 0.91 mg/dL (ref 0.40–1.20)
GFR: 78.04 mL/min (ref >60.00)
Glucose, Bld: 92 mg/dL (ref 70–99)
Potassium: 4.2 mEq/L (ref 3.5–5.1)
Sodium: 143 mEq/L (ref 135–145)

## 2017-06-25 LAB — HEPATIC FUNCTION PANEL
ALT: 11 U/L (ref 0–35)
AST: 17 U/L (ref 0–37)
Albumin: 4.1 g/dL (ref 3.5–5.2)
Alkaline Phosphatase: 97 U/L (ref 39–117)
Bilirubin, Direct: 0.1 mg/dL (ref 0.0–0.3)
Total Bilirubin: 0.4 mg/dL (ref 0.2–1.2)
Total Protein: 7 g/dL (ref 6.0–8.3)

## 2017-06-25 LAB — URINALYSIS, ROUTINE W REFLEX MICROSCOPIC
Bilirubin Urine: NEGATIVE
Hgb urine dipstick: NEGATIVE
Ketones, ur: NEGATIVE
Nitrite: NEGATIVE
Specific Gravity, Urine: 1.025 (ref 1.000–1.030)
Total Protein, Urine: NEGATIVE
Urine Glucose: NEGATIVE
Urobilinogen, UA: 0.2 (ref 0.0–1.0)
pH: 6 (ref 5.0–8.0)

## 2017-06-25 LAB — LIPID PANEL
Cholesterol: 195 mg/dL (ref 0–200)
HDL: 54 mg/dL (ref >39.00)
LDL Cholesterol: 123 mg/dL — ABNORMAL HIGH (ref 0–99)
NonHDL: 141.26
Total CHOL/HDL Ratio: 4
Triglycerides: 89 mg/dL (ref 0.0–149.0)
VLDL: 17.8 mg/dL (ref 0.0–40.0)

## 2017-06-25 LAB — TSH: TSH: 2.28 u[IU]/mL (ref 0.35–4.50)

## 2017-06-25 LAB — HEMOGLOBIN A1C: Hgb A1c MFr Bld: 5.9 % (ref 4.6–6.5)

## 2017-06-25 MED ORDER — ALBUTEROL SULFATE HFA 108 (90 BASE) MCG/ACT IN AERS
INHALATION_SPRAY | RESPIRATORY_TRACT | 5 refills | Status: DC
Start: 2017-06-25 — End: 2019-06-24

## 2017-06-25 MED ORDER — GLUCOSE BLOOD VI STRP: ORAL_STRIP | 4 refills | Status: DC

## 2017-06-25 NOTE — Patient Instructions (Addendum)
You had the Pneumovax pneumonia shot today  Please continue all other medications as before, and refills have been done if requested.  Please have the pharmacy call with any other refills you may need.  Please continue your efforts at being more active, low cholesterol diet, and weight control.  You are otherwise up to date with prevention measures today.  Please keep your appointments with your specialists as you may have planned  Please go to the LAB in the Basement (turn left off the elevator) for the tests to be done today  You will be contacted by phone if any changes need to be made immediately.  Otherwise, you will receive a letter about your results with an explanation, but please check with MyChart first.  Please remember to sign up for MyChart if you have not done so, as this will be important to you in the future with finding out test results, communicating by private email, and scheduling acute appointments online when needed.  Please return in 6 months, or sooner if needed, with Lab testing done 3-5 days before    

## 2017-06-25 NOTE — Assessment & Plan Note (Signed)

## 2017-06-25 NOTE — Progress Notes (Signed)
Subjective:    Patient ID: Brandi Shaffer, female    DOB: 1945/07/17, 72 y.o.   MRN: [id number]  HPI  Here for wellness and f/u;  Overall doing ok;  Pt denies Chest pain, worsening SOB, DOE, wheezing, orthopnea, PND, worsening LE edema, palpitations, dizziness or syncope.  Pt denies neurological change such as new headache, facial or extremity weakness.  Pt denies polydipsia, polyuria, or low sugar symptoms. Pt states overall good compliance with treatment and medications, good tolerability, and has been trying to follow appropriate diet.  Pt denies worsening depressive symptoms, suicidal ideation or panic. No fever, night sweats, wt loss, loss of appetite, or other constitutional symptoms.  Pt states good ability with ADL's, has low fall risk, home safety reviewed and adequate, no other significant changes in hearing or vision, and occasionally active with exercise.  Did have a1cv with insurance nurse at 5.2 recently.  Lost 6 lbs with better wt.   No other new complaints or interval hx change Wt Readings from Last 3 Encounters:  06/25/17 192 lb (87.1 kg)  01/01/17 198 lb (89.8 kg)  12/25/16 195 lb 4 oz (88.6 kg)   Past Medical History:  Diagnosis Date  . ANEMIA-IRON DEFICIENCY 12/06/2009  . Anxiety state 07/01/2015  . Asthma 12/05/2011  . DIABETES MELLITUS, TYPE II 09/17/2006  . DIVERTICULOSIS, COLON 12/06/2009  . GENITAL HERPES 03/24/2010  . GERD (gastroesophageal reflux disease) 06/28/2011  . HYPERLIPIDEMIA 03/24/2010  . HYPERTENSION 09/17/2006  . HYPOTHYROIDISM 09/17/2006  . LIPOMA 12/06/2009  . PERIPHERAL EDEMA 03/24/2010  . Renal cyst 07/05/2011   1.9 cm minimally complex   Past Surgical History:  Procedure Laterality Date  . ABDOMINAL HYSTERECTOMY     fibroids  . ankle surgury     x 3 left - chronic pain/swelling  . SHOULDER SURGERY Left   . TONSILLECTOMY    . TUBAL LIGATION      reports that she has never smoked. She has never used smokeless tobacco. She reports that she does not  drink alcohol or use drugs. family history includes Diabetes in her other; Heart disease in her sister; Hypertension in her father and mother; Joint hypermobility in her other; Prostate cancer in her brother; Stroke in her sister. Allergies  Allergen Reactions  . Januvia [Sitagliptin Phosphate]     dizziness  . Metformin And Related Nausea Only  . Doxycycline Rash   Current Outpatient Medications on File Prior to Visit  Medication Sig Dispense Refill  . amLODipine (NORVASC) 10 MG tablet Take 1 tablet (10 mg total) by mouth daily. 90 tablet 3  . amoxicillin (AMOXIL) 500 MG capsule Take 2 capsules (1,000 mg total) 2 (two) times daily by mouth. 40 capsule 0  . aspirin 81 MG EC tablet TAKE 1 TABLET BY MOUTH DAILY. SWALLOW WHOLE. 30 tablet 11  . Cholecalciferol (VITAMIN D) 400 UNITS tablet Take 400 Units by mouth daily.      . citalopram (CELEXA) 10 MG tablet TAKE 1 TABLET (10 MG TOTAL) BY MOUTH DAILY. 90 tablet 1  . Coenzyme Q10 (COQ10) 100 MG capsule Take 100 mg by mouth daily.      Marland Kitchen ibuprofen (ADVIL,MOTRIN) 400 MG tablet TAKE 1 TABLET (400 MG TOTAL) BY MOUTH EVERY 6 (SIX) HOURS AS NEEDED FOR PAIN. 60 tablet 5  . ipratropium-albuterol (DUONEB) 0.5-2.5 (3) MG/3ML SOLN USE 1 VIAL VIA NEBULIZER EVERY 6 HOURS AS NEEDED 360 mL 2  . KLOR-CON 10 10 MEQ tablet TAKE 1 TABLET BY MOUTH EVERY DAY 90  tablet 1  . Misc Natural Products (OSTEO BI-FLEX TRIPLE STRENGTH PO) Take by mouth.    . Multiple Vitamins-Minerals (WOMENS MULTIVITAMIN PO) Take 1 tablet by mouth daily.    Marland Kitchen olmesartan-hydrochlorothiazide (BENICAR HCT) 20-12.5 MG tablet Take 1 tablet by mouth daily. 90 tablet 3  . ONETOUCH DELICA LANCETS 33G MISC 1 each by Does not apply route 2 (two) times daily. Use to help check blood sugars twice a day Dx E11.9 200 each 11  . pioglitazone (ACTOS) 15 MG tablet TAKE 1 TABLET BY MOUTH EVERY DAY 90 tablet 1  . rosuvastatin (CRESTOR) 10 MG tablet TAKE 1 TABLET (10 MG TOTAL) BY MOUTH DAILY. 90 tablet 1   No  current facility-administered medications on file prior to visit.    Review of Systems Constitutional: Negative for other unusual diaphoresis, sweats, appetite or weight changes HENT: Negative for other worsening hearing loss, ear pain, facial swelling, mouth sores or neck stiffness.   Eyes: Negative for other worsening pain, redness or other visual disturbance.  Respiratory: Negative for other stridor or swelling Cardiovascular: Negative for other palpitations or other chest pain  Gastrointestinal: Negative for worsening diarrhea or loose stools, blood in stool, distention or other pain Genitourinary: Negative for hematuria, flank pain or other change in urine volume.  Musculoskeletal: Negative for myalgias or other joint swelling.  Skin: Negative for other color change, or other wound or worsening drainage.  Neurological: Negative for other syncope or numbness. Hematological: Negative for other adenopathy or swelling Psychiatric/Behavioral: Negative for hallucinations, other worsening agitation, SI, self-injury, or new decreased concentration All other system neg per pt    Objective:   Physical Exam BP 124/82   Pulse 89   Temp 97.8 F (36.6 C) (Oral)   Ht 4\' 11"  (1.499 m)   Wt 192 lb (87.1 kg)   SpO2 97%   BMI 38.78 kg/m  VS noted,  Constitutional: Pt is oriented to person, place, and time. Appears well-developed and well-nourished, in no significant distress and comfortable Head: Normocephalic and atraumatic  Eyes: Conjunctivae and EOM are normal. Pupils are equal, round, and reactive to light Right Ear: External ear normal without discharge Left Ear: External ear normal without discharge Nose: Nose without discharge or deformity Mouth/Throat: Oropharynx is without other ulcerations and moist  Neck: Normal range of motion. Neck supple. No JVD present. No tracheal deviation present or significant neck LA or mass Cardiovascular: Normal rate, regular rhythm, normal heart sounds  and intact distal pulses.   Pulmonary/Chest: WOB normal and breath sounds without rales or wheezing  Abdominal: Soft. Bowel sounds are normal. NT. No HSM  Musculoskeletal: Normal range of motion. Exhibits no edema Lymphadenopathy: Has no other cervical adenopathy.  Neurological: Pt is alert and oriented to person, place, and time. Pt has normal reflexes. No cranial nerve deficit. Motor grossly intact, Gait intact Skin: Skin is warm and dry. No rash noted or new ulcerations Psychiatric:  Has normal mood and affect. Behavior is normal without agitation No other exam findings Lab Results  Component Value Date   WBC 5.5 06/15/2016   HGB 13.2 06/15/2016   HCT 40.1 06/15/2016   PLT 244.0 06/15/2016   GLUCOSE 88 12/25/2016   CHOL 170 12/25/2016   TRIG 82.0 12/25/2016   HDL 54.10 12/25/2016   LDLDIRECT 134.6 05/23/2010   LDLCALC 100 (H) 12/25/2016   ALT 12 12/25/2016   AST 18 12/25/2016   NA 142 12/25/2016   K 3.9 12/25/2016   CL 104 12/25/2016   CREATININE  0.88 12/25/2016   BUN 19 12/25/2016   CO2 29 12/25/2016   TSH 1.95 06/15/2016   HGBA1C 5.9 12/25/2016   MICROALBUR 0.7 06/15/2016         Assessment & Plan:

## 2017-06-25 NOTE — Assessment & Plan Note (Signed)
stable overall by history and exam, recent data reviewed with pt, and pt to continue medical treatment as before,  to f/u any worsening symptoms or concerns Lab Results  Component Value Date   HGBA1C 5.9 06/25/2017

## 2017-08-18 ENCOUNTER — Other Ambulatory Visit: Payer: Self-pay | Admitting: Internal Medicine

## 2017-11-14 NOTE — Progress Notes (Addendum)
Subjective:   Brandi Shaffer is a 72 y.o. female who presents for Medicare Annual (Subsequent) preventive examination.  Review of Systems:  No ROS.  Medicare Wellness Visit. Additional risk factors are reflected in the social history.  Cardiac Risk Factors include: advanced age (>66men, >83 women);diabetes mellitus;dyslipidemia;hypertension;obesity (BMI >30kg/m2) Sleep patterns: feels rested on waking, gets up 1-2 times nightly to void and sleeps 6-7 hours nightly.    Home Safety/Smoke Alarms: Feels safe in home. Smoke alarms in place.  Living environment; residence and Firearm Safety: 1-story house/ trailer, no firearms Lives with grandson, no needs for DME, good support system. Seat Belt Safety/Bike Helmet: Wears seat belt.    Objective:     Vitals: BP (!) 142/78   Pulse 80   Resp 18   Ht 4\' 11"  (1.499 m)   Wt 196 lb (88.9 kg)   SpO2 98%   BMI 39.59 kg/m   Body mass index is 39.59 kg/m.  Advanced Directives 11/15/2017 11/14/2016 07/31/2015 06/30/2015 06/29/2014  Does Patient Have a Medical Advance Directive? No No No No No  Does patient want to make changes to medical advance directive? - Yes (ED - Information included in AVS) - - -  Would patient like information on creating a medical advance directive? Yes (ED - Information included in AVS) - No - patient declined information - No - patient declined information    Tobacco Social History   Tobacco Use  Smoking Status Never Smoker  Smokeless Tobacco Never Used     Counseling given: Not Answered  Past Medical History:  Diagnosis Date  . ANEMIA-IRON DEFICIENCY 12/06/2009  . Anxiety state 07/01/2015  . Asthma 12/05/2011  . DIABETES MELLITUS, TYPE II 09/17/2006  . DIVERTICULOSIS, COLON 12/06/2009  . GENITAL HERPES 03/24/2010  . GERD (gastroesophageal reflux disease) 06/28/2011  . HYPERLIPIDEMIA 03/24/2010  . HYPERTENSION 09/17/2006  . HYPOTHYROIDISM 09/17/2006  . LIPOMA 12/06/2009  . PERIPHERAL EDEMA 03/24/2010  . Renal cyst  07/05/2011   1.9 cm minimally complex   Past Surgical History:  Procedure Laterality Date  . ABDOMINAL HYSTERECTOMY     fibroids  . ankle surgury     x 3 left - chronic pain/swelling  . SHOULDER SURGERY Left   . TONSILLECTOMY    . TUBAL LIGATION     Family History  Problem Relation Age of Onset  . Hypertension Mother   . Hypertension Father   . Heart disease Sister        sudden death early 20's  . Stroke Sister   . Prostate cancer Brother        cancer  . Diabetes Other   . Joint hypermobility Other        DJD   Social History   Socioeconomic History  . Marital status: Divorced    Spouse name: Not on file  . Number of children: 7  . Years of education: Not on file  . Highest education level: Not on file  Occupational History  . Occupation: Scientist, water quality    Comment: gas station, stands for work  Social Needs  . Financial resource strain: Not hard at all  . Food insecurity:    Worry: Never true    Inability: Never true  . Transportation needs:    Medical: No    Non-medical: No  Tobacco Use  . Smoking status: Never Smoker  . Smokeless tobacco: Never Used  Substance and Sexual Activity  . Alcohol use: No    Alcohol/week: 0.0 standard drinks  .  Drug use: No  . Sexual activity: Not Currently  Lifestyle  . Physical activity:    Days per week: 0 days    Minutes per session: 0 min  . Stress: Not at all  Relationships  . Social connections:    Talks on phone: More than three times a week    Gets together: More than three times a week    Attends religious service: Never    Active member of club or organization: Yes    Attends meetings of clubs or organizations: More than 4 times per year    Relationship status: Divorced  Other Topics Concern  . Not on file  Social History Narrative   Lives with grandson and and pets;    Have 6 sons and one dtr   Have grand- children; running children through out the day and cooking    Outpatient Encounter Medications as of  11/15/2017  Medication Sig  . albuterol (PROAIR HFA) 108 (90 Base) MCG/ACT inhaler INHALE 2 PUFFS INTO THE LUNGS EVERY 6 (SIX) HOURS AS NEEDED FOR WHEEZING.  Marland Kitchen amLODipine (NORVASC) 10 MG tablet TAKE 1 TABLET BY MOUTH EVERY DAY  . aspirin 81 MG EC tablet TAKE 1 TABLET BY MOUTH DAILY. SWALLOW WHOLE.  Marland Kitchen Cholecalciferol (VITAMIN D) 400 UNITS tablet Take 400 Units by mouth daily.    . Coenzyme Q10 (COQ10) 100 MG capsule Take 100 mg by mouth daily.    Marland Kitchen glucose blood (ONETOUCH VERIO) test strip USE 2 (TWO) TIMES DAILY. DX E11.9 *ONE TOUCH PER DR*  . ibuprofen (ADVIL,MOTRIN) 400 MG tablet TAKE 1 TABLET (400 MG TOTAL) BY MOUTH EVERY 6 (SIX) HOURS AS NEEDED FOR PAIN.  Marland Kitchen ipratropium-albuterol (DUONEB) 0.5-2.5 (3) MG/3ML SOLN USE 1 VIAL VIA NEBULIZER EVERY 6 HOURS AS NEEDED  . KLOR-CON 10 10 MEQ tablet TAKE 1 TABLET BY MOUTH EVERY DAY  . Misc Natural Products (OSTEO BI-FLEX TRIPLE STRENGTH PO) Take by mouth.  . Multiple Vitamins-Minerals (WOMENS MULTIVITAMIN PO) Take 1 tablet by mouth daily.  Marland Kitchen olmesartan-hydrochlorothiazide (BENICAR HCT) 20-12.5 MG tablet Take 1 tablet by mouth daily.  Glory Rosebush DELICA LANCETS 61P MISC 1 each by Does not apply route 2 (two) times daily. Use to help check blood sugars twice a day Dx E11.9  . pioglitazone (ACTOS) 15 MG tablet TAKE 1 TABLET BY MOUTH EVERY DAY  . rosuvastatin (CRESTOR) 10 MG tablet TAKE 1 TABLET (10 MG TOTAL) BY MOUTH DAILY.  . [DISCONTINUED] amoxicillin (AMOXIL) 500 MG capsule Take 2 capsules (1,000 mg total) 2 (two) times daily by mouth. (Patient not taking: Reported on 11/15/2017)  . [DISCONTINUED] citalopram (CELEXA) 10 MG tablet TAKE 1 TABLET (10 MG TOTAL) BY MOUTH DAILY. (Patient not taking: Reported on 11/15/2017)   No facility-administered encounter medications on file as of 11/15/2017.     Activities of Daily Living In your present state of health, do you have any difficulty performing the following activities: 11/15/2017  Hearing? N  Vision? N    Difficulty concentrating or making decisions? N  Walking or climbing stairs? N  Dressing or bathing? N  Doing errands, shopping? N  Preparing Food and eating ? N  Using the Toilet? N  In the past six months, have you accidently leaked urine? N  Do you have problems with loss of bowel control? N  Managing your Medications? N  Managing your Finances? N  Housekeeping or managing your Housekeeping? N  Some recent data might be hidden    Patient Care Team: Cathlean Cower  W, MD as PCP - General    Assessment:   This is a routine wellness examination for [place]. Physical assessment deferred to PCP.   Exercise Activities and Dietary recommendations Current Exercise Habits: The patient does not participate in regular exercise at present, Exercise limited by: None identified  Diet (meal preparation, eat out, water intake, caffeinated beverages, dairy products, fruits and vegetables): in general, a "healthy" diet  , well balanced, on average, 1 meals per day   Reviewed heart healthy and diabetic diet. Encouraged patient to increase daily water and healthy fluid intake. Discussed not skipping meals and drinking glucerna, samples and coupons provided.  Goals      Patient Stated   . patient (pt-stated)     De stress and enjoy her life      Other   . I am learning to take tough love measures with my daughter.      Spend time doing things for me, enjoy life, family and be carefree    . Patient Stated    . Patient Stated     I will begin to eat 3 meals per day. Continue to stay busy and love my family.    . Weight < 200 lb (90.719 kg)     Walks a lot and prays./ get on machines;  Silver sneakers and the to the gym Mid-morning; is good/ may try zumba;         Fall Risk Fall Risk  11/15/2017 06/25/2017 11/14/2016 12/16/2015 06/30/2015  Falls in the past year? No No No Yes No  Number falls in past yr: - - - 1 -  Injury with Fall? - - - Yes -  Comment - - - right shoulder dislocation -     Depression Screen PHQ 2/9 Scores 11/15/2017 06/25/2017 12/25/2016 11/14/2016  PHQ - 2 Score 0 0 0 0  PHQ- 9 Score - - 0 0     Cognitive Function MMSE - Mini Mental State Exam 11/14/2016 06/29/2014  Not completed: - Unable to complete  Orientation to time 5 -  Orientation to Place 5 -  Registration 3 -  Attention/ Calculation 4 -  Recall 2 -  Language- name 2 objects 2 -  Language- repeat 1 -  Language- follow 3 step command 3 -  Language- read & follow direction 1 -  Write a sentence 1 -  Copy design 1 -  Total score 28 -       Ad8 score reviewed for issues:  Issues making decisions: no  Less interest in hobbies / activities: no  Repeats questions, stories (family complaining): no  Trouble using ordinary gadgets (microwave, computer, phone):no  Forgets the month or year: no  Mismanaging finances: no  Remembering appts: no  Daily problems with thinking and/or memory: no Ad8 score is= 0  Immunization History  Administered Date(s) Administered  . Influenza Split 11/21/2010, 12/05/2011  . Influenza Whole 12/06/2009  . Influenza, High Dose Seasonal PF 12/04/2012, 12/04/2013, 11/14/2016  . Influenza,inj,Quad PF,6+ Mos 12/15/2014  . Pneumococcal Conjugate-13 12/19/2012  . Pneumococcal Polysaccharide-23 11/27/2004, 12/06/2009, 06/25/2017  . Td 11/27/2004, 06/08/2014   Screening Tests Health Maintenance  Topic Date Due  . INFLUENZA VACCINE  09/26/2017  . HEMOGLOBIN A1C  12/25/2017  . OPHTHALMOLOGY EXAM  02/13/2018  . FOOT EXAM  06/26/2018  . MAMMOGRAM  04/30/2019  . COLONOSCOPY  01/17/2020  . TETANUS/TDAP  06/07/2024  . DEXA SCAN  Completed  . Hepatitis C Screening  Completed  . PNA vac  Low Risk Adult  Completed      Plan:     Continue doing brain stimulating activities (puzzles, reading, adult coloring books, staying active) to keep memory sharp.   Continue to eat heart healthy diet (full of fruits, vegetables, whole grains, lean protein, water--limit  salt, fat, and sugar intake) and increase physical activity as tolerated.  I have personally reviewed and noted the following in the patient's chart:   . Medical and social history . Use of alcohol, tobacco or illicit drugs  . Current medications and supplements . Functional ability and status . Nutritional status . Physical activity . Advanced directives . List of other physicians . Vitals . Screenings to include cognitive, depression, and falls . Referrals and appointments  In addition, I have reviewed and discussed with patient certain preventive protocols, quality metrics, and best practice recommendations. A written personalized care plan for preventive services as well as general preventive health recommendations were provided to patient.     Michiel Cowboy, RN  11/15/2017  Medical screening examination/treatment/procedure(s) were performed by non-physician practitioner and as supervising physician I was immediately available for consultation/collaboration. I agree with above. Cathlean Cower, MD

## 2017-11-15 ENCOUNTER — Ambulatory Visit (INDEPENDENT_AMBULATORY_CARE_PROVIDER_SITE_OTHER): Payer: Medicare Other | Admitting: *Deleted

## 2017-11-15 VITALS — BP 142/78 | HR 80 | Resp 18 | Ht 59.0 in | Wt 196.0 lb

## 2017-11-15 DIAGNOSIS — Z0000 Encounter for general adult medical examination without abnormal findings: Secondary | ICD-10-CM | POA: Diagnosis not present

## 2017-11-15 DIAGNOSIS — Z23 Encounter for immunization: Secondary | ICD-10-CM

## 2017-11-15 NOTE — Patient Instructions (Addendum)
Continue doing brain stimulating activities (puzzles, reading, adult coloring books, staying active) to keep memory sharp.   Continue to eat heart healthy diet (full of fruits, vegetables, whole grains, lean protein, water--limit salt, fat, and sugar intake) and increase physical activity as tolerated.  Brandi Shaffer , Thank you for taking time to come for your Medicare Wellness Visit. I appreciate your ongoing commitment to your health goals. Please review the following plan we discussed and let me know if I can assist you in the future.   These are the goals we discussed: Goals      Patient Stated   . patient (pt-stated)     De stress and enjoy her life      Other   . I am learning to take tough love measures with my daughter.      Spend time doing things for me, enjoy life, family and be carefree    . Patient Stated    . Patient Stated     I will begin to eat 3 meals per day. Continue to stay busy and love my family.    . Weight < 200 lb (90.719 kg)     Walks a lot and prays./ get on machines;  Silver sneakers and the to the gym Mid-morning; is good/ may try zumba;         This is a list of the screening recommended for you and due dates:  Health Maintenance  Topic Date Due  . Flu Shot  09/26/2017  . Hemoglobin A1C  12/25/2017  . Eye exam for diabetics  02/13/2018  . Complete foot exam   06/26/2018  . Mammogram  04/30/2019  . Colon Cancer Screening  01/17/2020  . Tetanus Vaccine  06/07/2024  . DEXA scan (bone density measurement)  Completed  .  Hepatitis C: One time screening is recommended by Center for Disease Control  (CDC) for  adults born from [birth year] through 1966.   Completed  . Pneumonia vaccines  Completed     Influenza Virus Vaccine injection What is this medicine? INFLUENZA VIRUS VACCINE (in floo EN zuh VAHY ruhs vak SEEN) helps to reduce the risk of getting influenza also known as the flu. The vaccine only helps protect you against some strains of the  flu. This medicine may be used for other purposes; ask your health care provider or pharmacist if you have questions. COMMON BRAND NAME(S): Afluria, Agriflu, Alfuria, FLUAD, Fluarix, Fluarix Quadrivalent, Flublok, Flublok Quadrivalent, FLUCELVAX, Flulaval, Fluvirin, Fluzone, Fluzone High-Dose, Fluzone Intradermal What should I tell my health care provider before I take this medicine? They need to know if you have any of these conditions: -bleeding disorder like hemophilia -fever or infection -Guillain-Barre syndrome or other neurological problems -immune system problems -infection with the human immunodeficiency virus (HIV) or AIDS -low blood platelet counts -multiple sclerosis -an unusual or allergic reaction to influenza virus vaccine, latex, other medicines, foods, dyes, or preservatives. Different brands of vaccines contain different allergens. Some may contain latex or eggs. Talk to your doctor about your allergies to make sure that you get the right vaccine. -pregnant or trying to get pregnant -breast-feeding How should I use this medicine? This vaccine is for injection into a muscle or under the skin. It is given by a health care professional. A copy of Vaccine Information Statements will be given before each vaccination. Read this sheet carefully each time. The sheet may change frequently. Talk to your healthcare provider to see which vaccines are right for you.  Some vaccines should not be used in all age groups. Overdosage: If you think you have taken too much of this medicine contact a poison control center or emergency room at once. NOTE: This medicine is only for you. Do not share this medicine with others. What if I miss a dose? This does not apply. What may interact with this medicine? -chemotherapy or radiation therapy -medicines that lower your immune system like etanercept, anakinra, infliximab, and adalimumab -medicines that treat or prevent blood clots like  warfarin -phenytoin -steroid medicines like prednisone or cortisone -theophylline -vaccines This list may not describe all possible interactions. Give your health care provider a list of all the medicines, herbs, non-prescription drugs, or dietary supplements you use. Also tell them if you smoke, drink alcohol, or use illegal drugs. Some items may interact with your medicine. What should I watch for while using this medicine? Report any side effects that do not go away within 3 days to your doctor or health care professional. Call your health care provider if any unusual symptoms occur within 6 weeks of receiving this vaccine. You may still catch the flu, but the illness is not usually as bad. You cannot get the flu from the vaccine. The vaccine will not protect against colds or other illnesses that may cause fever. The vaccine is needed every year. What side effects may I notice from receiving this medicine? Side effects that you should report to your doctor or health care professional as soon as possible: -allergic reactions like skin rash, itching or hives, swelling of the face, lips, or tongue Side effects that usually do not require medical attention (report to your doctor or health care professional if they continue or are bothersome): -fever -headache -muscle aches and pains -pain, tenderness, redness, or swelling at the injection site -tiredness This list may not describe all possible side effects. Call your doctor for medical advice about side effects. You may report side effects to FDA at 1-800-FDA-1088. Where should I keep my medicine? The vaccine will be given by a health care professional in a clinic, pharmacy, doctor's office, or other health care setting. You will not be given vaccine doses to store at home. NOTE: This sheet is a summary. It may not cover all possible information. If you have questions about this medicine, talk to your doctor, pharmacist, or health care  provider.  2018 Elsevier/Gold Standard (2014-09-03 10:07:28)   Health Maintenance, Female Adopting a healthy lifestyle and getting preventive care can go a long way to promote health and wellness. Talk with your health care provider about what schedule of regular examinations is right for you. This is a good chance for you to check in with your provider about disease prevention and staying healthy. In between checkups, there are plenty of things you can do on your own. Experts have done a lot of research about which lifestyle changes and preventive measures are most likely to keep you healthy. Ask your health care provider for more information. Weight and diet Eat a healthy diet  Be sure to include plenty of vegetables, fruits, low-fat dairy products, and lean protein.  Do not eat a lot of foods high in solid fats, added sugars, or salt.  Get regular exercise. This is one of the most important things you can do for your health. ? Most adults should exercise for at least 150 minutes each week. The exercise should increase your heart rate and make you sweat (moderate-intensity exercise). ? Most adults should also do  strengthening exercises at least twice a week. This is in addition to the moderate-intensity exercise.  Maintain a healthy weight  Body mass index (BMI) is a measurement that can be used to identify possible weight problems. It estimates body fat based on height and weight. Your health care provider can help determine your BMI and help you achieve or maintain a healthy weight.  For females 77 years of age and older: ? A BMI below 18.5 is considered underweight. ? A BMI of 18.5 to 24.9 is normal. ? A BMI of 25 to 29.9 is considered overweight. ? A BMI of 30 and above is considered obese.  Watch levels of cholesterol and blood lipids  You should start having your blood tested for lipids and cholesterol at 73 years of age, then have this test every 5 years.  You may need to  have your cholesterol levels checked more often if: ? Your lipid or cholesterol levels are high. ? You are older than 73 years of age. ? You are at high risk for heart disease.  Cancer screening Lung Cancer  Lung cancer screening is recommended for adults 25-38 years old who are at high risk for lung cancer because of a history of smoking.  A yearly low-dose CT scan of the lungs is recommended for people who: ? Currently smoke. ? Have quit within the past 15 years. ? Have at least a 30-pack-year history of smoking. A pack year is smoking an average of one pack of cigarettes a day for 1 year.  Yearly screening should continue until it has been 15 years since you quit.  Yearly screening should stop if you develop a health problem that would prevent you from having lung cancer treatment.  Breast Cancer  Practice breast self-awareness. This means understanding how your breasts normally appear and feel.  It also means doing regular breast self-exams. Let your health care provider know about any changes, no matter how small.  If you are in your 20s or 30s, you should have a clinical breast exam (CBE) by a health care provider every 1-3 years as part of a regular health exam.  If you are 26 or older, have a CBE every year. Also consider having a breast X-ray (mammogram) every year.  If you have a family history of breast cancer, talk to your health care provider about genetic screening.  If you are at high risk for breast cancer, talk to your health care provider about having an MRI and a mammogram every year.  Breast cancer gene (BRCA) assessment is recommended for women who have family members with BRCA-related cancers. BRCA-related cancers include: ? Breast. ? Ovarian. ? Tubal. ? Peritoneal cancers.  Results of the assessment will determine the need for genetic counseling and BRCA1 and BRCA2 testing.  Cervical Cancer Your health care provider may recommend that you be screened  regularly for cancer of the pelvic organs (ovaries, uterus, and vagina). This screening involves a pelvic examination, including checking for microscopic changes to the surface of your cervix (Pap test). You may be encouraged to have this screening done every 3 years, beginning at age 31.  For women ages 61-65, health care providers may recommend pelvic exams and Pap testing every 3 years, or they may recommend the Pap and pelvic exam, combined with testing for human papilloma virus (HPV), every 5 years. Some types of HPV increase your risk of cervical cancer. Testing for HPV may also be done on women of any age with unclear  Pap test results.  Other health care providers may not recommend any screening for nonpregnant women who are considered low risk for pelvic cancer and who do not have symptoms. Ask your health care provider if a screening pelvic exam is right for you.  If you have had past treatment for cervical cancer or a condition that could lead to cancer, you need Pap tests and screening for cancer for at least 20 years after your treatment. If Pap tests have been discontinued, your risk factors (such as having a new sexual partner) need to be reassessed to determine if screening should resume. Some women have medical problems that increase the chance of getting cervical cancer. In these cases, your health care provider may recommend more frequent screening and Pap tests.  Colorectal Cancer  This type of cancer can be detected and often prevented.  Routine colorectal cancer screening usually begins at 73 years of age and continues through 73 years of age.  Your health care provider may recommend screening at an earlier age if you have risk factors for colon cancer.  Your health care provider may also recommend using home test kits to check for hidden blood in the stool.  A small camera at the end of a tube can be used to examine your colon directly (sigmoidoscopy or colonoscopy). This is  done to check for the earliest forms of colorectal cancer.  Routine screening usually begins at age 93.  Direct examination of the colon should be repeated every 5-10 years through 73 years of age. However, you may need to be screened more often if early forms of precancerous polyps or small growths are found.  Skin Cancer  Check your skin from head to toe regularly.  Tell your health care provider about any new moles or changes in moles, especially if there is a change in a mole's shape or color.  Also tell your health care provider if you have a mole that is larger than the size of a pencil eraser.  Always use sunscreen. Apply sunscreen liberally and repeatedly throughout the day.  Protect yourself by wearing long sleeves, pants, a wide-brimmed hat, and sunglasses whenever you are outside.  Heart disease, diabetes, and high blood pressure  High blood pressure causes heart disease and increases the risk of stroke. High blood pressure is more likely to develop in: ? People who have blood pressure in the high end of the normal range (130-139/85-89 mm Hg). ? People who are overweight or obese. ? People who are African American.  If you are 53-34 years of age, have your blood pressure checked every 3-5 years. If you are 24 years of age or older, have your blood pressure checked every year. You should have your blood pressure measured twice-once when you are at a hospital or clinic, and once when you are not at a hospital or clinic. Record the average of the two measurements. To check your blood pressure when you are not at a hospital or clinic, you can use: ? An automated blood pressure machine at a pharmacy. ? A home blood pressure monitor.  If you are between 106 years and 85 years old, ask your health care provider if you should take aspirin to prevent strokes.  Have regular diabetes screenings. This involves taking a blood sample to check your fasting blood sugar level. ? If you are  at a normal weight and have a low risk for diabetes, have this test once every three years after 73 years of  age. ? If you are overweight and have a high risk for diabetes, consider being tested at a younger age or more often. Preventing infection Hepatitis B  If you have a higher risk for hepatitis B, you should be screened for this virus. You are considered at high risk for hepatitis B if: ? You were born in a country where hepatitis B is common. Ask your health care provider which countries are considered high risk. ? Your parents were born in a high-risk country, and you have not been immunized against hepatitis B (hepatitis B vaccine). ? You have HIV or AIDS. ? You use needles to inject street drugs. ? You live with someone who has hepatitis B. ? You have had sex with someone who has hepatitis B. ? You get hemodialysis treatment. ? You take certain medicines for conditions, including cancer, organ transplantation, and autoimmune conditions.  Hepatitis C  Blood testing is recommended for: ? Everyone born from [birth year] through 1966. ? Anyone with known risk factors for hepatitis C.  Sexually transmitted infections (STIs)  You should be screened for sexually transmitted infections (STIs) including gonorrhea and chlamydia if: ? You are sexually active and are younger than 73 years of age. ? You are older than 73 years of age and your health care provider tells you that you are at risk for this type of infection. ? Your sexual activity has changed since you were last screened and you are at an increased risk for chlamydia or gonorrhea. Ask your health care provider if you are at risk.  If you do not have HIV, but are at risk, it may be recommended that you take a prescription medicine daily to prevent HIV infection. This is called pre-exposure prophylaxis (PrEP). You are considered at risk if: ? You are sexually active and do not regularly use condoms or know the HIV status of your  partner(s). ? You take drugs by injection. ? You are sexually active with a partner who has HIV.  Talk with your health care provider about whether you are at high risk of being infected with HIV. If you choose to begin PrEP, you should first be tested for HIV. You should then be tested every 3 months for as long as you are taking PrEP. Pregnancy  If you are premenopausal and you may become pregnant, ask your health care provider about preconception counseling.  If you may become pregnant, take 400 to 800 micrograms (mcg) of folic acid every day.  If you want to prevent pregnancy, talk to your health care provider about birth control (contraception). Osteoporosis and menopause  Osteoporosis is a disease in which the bones lose minerals and strength with aging. This can result in serious bone fractures. Your risk for osteoporosis can be identified using a bone density scan.  If you are 35 years of age or older, or if you are at risk for osteoporosis and fractures, ask your health care provider if you should be screened.  Ask your health care provider whether you should take a calcium or vitamin D supplement to lower your risk for osteoporosis.  Menopause may have certain physical symptoms and risks.  Hormone replacement therapy may reduce some of these symptoms and risks. Talk to your health care provider about whether hormone replacement therapy is right for you. Follow these instructions at home:  Schedule regular health, dental, and eye exams.  Stay current with your immunizations.  Do not use any tobacco products including cigarettes, chewing tobacco, or electronic  cigarettes.  If you are pregnant, do not drink alcohol.  If you are breastfeeding, limit how much and how often you drink alcohol.  Limit alcohol intake to no more than 1 drink per day for nonpregnant women. One drink equals 12 ounces of beer, 5 ounces of Lonnetta Kniskern, or 1 ounces of hard liquor.  Do not use street  drugs.  Do not share needles.  Ask your health care provider for help if you need support or information about quitting drugs.  Tell your health care provider if you often feel depressed.  Tell your health care provider if you have ever been abused or do not feel safe at home. This information is not intended to replace advice given to you by your health care provider. Make sure you discuss any questions you have with your health care provider. Document Released: 08/28/2010 Document Revised: 07/21/2015 Document Reviewed: 11/16/2014 Elsevier Interactive Patient Education  [employer].

## 2017-11-26 ENCOUNTER — Other Ambulatory Visit: Payer: Self-pay | Admitting: Internal Medicine

## 2017-12-04 LAB — HM DIABETES EYE EXAM

## 2017-12-10 DIAGNOSIS — H5213 Myopia, bilateral: Secondary | ICD-10-CM | POA: Diagnosis not present

## 2017-12-17 NOTE — Progress Notes (Signed)
[name] [place], [place] [address] Phone: [phone] Subjective:    I Brandi Shaffer am serving as a Education administrator for Dr. Hulan Saas.  CC: Left foot pain  CHE:[id number]  Brandi Shaffer is a 72 y.o. female coming in with complaint of left foot pain. 2nd toe. Dog ran between her legs when she tripped and fell injuring her 2nd toe. States her toe turned purple. History of ankle surgery. Swelling. Was wearing sneakers at the time. Has ROM. No numbness and tingling noted however the toe is stiff.    Onset- Last Sunday  Character- Sore Aggravating factors- weight bearing  Reliving factors-sitting Therapies tried- epsom salt, topical, elevation Severity-initially 8 out of 10 but now 3 out of 10     Past Medical History:  Diagnosis Date  . ANEMIA-IRON DEFICIENCY 12/06/2009  . Anxiety state 07/01/2015  . Asthma 12/05/2011  . DIABETES MELLITUS, TYPE II 09/17/2006  . DIVERTICULOSIS, COLON 12/06/2009  . GENITAL HERPES 03/24/2010  . GERD (gastroesophageal reflux disease) 06/28/2011  . HYPERLIPIDEMIA 03/24/2010  . HYPERTENSION 09/17/2006  . HYPOTHYROIDISM 09/17/2006  . LIPOMA 12/06/2009  . PERIPHERAL EDEMA 03/24/2010  . Renal cyst 07/05/2011   1.9 cm minimally complex   Past Surgical History:  Procedure Laterality Date  . ABDOMINAL HYSTERECTOMY     fibroids  . ankle surgury     x 3 left - chronic pain/swelling  . SHOULDER SURGERY Left   . TONSILLECTOMY    . TUBAL LIGATION     Social History   Socioeconomic History  . Marital status: Divorced    Spouse name: Not on file  . Number of children: 7  . Years of education: Not on file  . Highest education level: Not on file  Occupational History  . Occupation: Scientist, water quality    Comment: gas station, stands for work  Social Needs  . Financial resource strain: Not hard at all  . Food insecurity:    Worry: Never true    Inability: Never true  . Transportation needs:    Medical: No    Non-medical: No   Tobacco Use  . Smoking status: Never Smoker  . Smokeless tobacco: Never Used  Substance and Sexual Activity  . Alcohol use: No    Alcohol/week: 0.0 standard drinks  . Drug use: No  . Sexual activity: Not Currently  Lifestyle  . Physical activity:    Days per week: 0 days    Minutes per session: 0 min  . Stress: Not at all  Relationships  . Social connections:    Talks on phone: More than three times a week    Gets together: More than three times a week    Attends religious service: Never    Active member of club or organization: Yes    Attends meetings of clubs or organizations: More than 4 times per year    Relationship status: Divorced  Other Topics Concern  . Not on file  Social History Narrative   Lives with grandson and and pets;    Have 6 sons and one dtr   Have grand- children; running children through out the day and cooking   Allergies  Allergen Reactions  . Januvia [Sitagliptin Phosphate]     dizziness  . Metformin And Related Nausea Only  . Doxycycline Rash   Family History  Problem Relation Age of Onset  . Hypertension Mother   . Hypertension Father   . Heart disease Sister  sudden death early 23's  . Stroke Sister   . Prostate cancer Brother        cancer  . Diabetes Other   . Joint hypermobility Other        DJD    Current Outpatient Medications (Endocrine & Metabolic):  .  pioglitazone (ACTOS) 15 MG tablet, TAKE 1 TABLET BY MOUTH EVERY DAY  Current Outpatient Medications (Cardiovascular):  .  amLODipine (NORVASC) 10 MG tablet, TAKE 1 TABLET BY MOUTH EVERY DAY .  olmesartan-hydrochlorothiazide (BENICAR HCT) 20-12.5 MG tablet, Take 1 tablet by mouth daily. .  rosuvastatin (CRESTOR) 10 MG tablet, TAKE 1 TABLET (10 MG TOTAL) BY MOUTH DAILY.  Current Outpatient Medications (Respiratory):  .  albuterol (PROAIR HFA) 108 (90 Base) MCG/ACT inhaler, INHALE 2 PUFFS INTO THE LUNGS EVERY 6 (SIX) HOURS AS NEEDED FOR WHEEZING. Marland Kitchen   ipratropium-albuterol (DUONEB) 0.5-2.5 (3) MG/3ML SOLN, USE 1 VIAL VIA NEBULIZER EVERY 6 HOURS AS NEEDED  Current Outpatient Medications (Analgesics):  .  aspirin 81 MG EC tablet, TAKE 1 TABLET BY MOUTH DAILY. SWALLOW WHOLE. Marland Kitchen  ibuprofen (ADVIL,MOTRIN) 400 MG tablet, TAKE 1 TABLET (400 MG TOTAL) BY MOUTH EVERY 6 (SIX) HOURS AS NEEDED FOR PAIN.   Current Outpatient Medications (Other):  Marland Kitchen  Cholecalciferol (VITAMIN D) 400 UNITS tablet, Take 400 Units by mouth daily.   .  citalopram (CELEXA) 10 MG tablet, TAKE 1 TABLET (10 MG TOTAL) BY MOUTH DAILY. Marland Kitchen  Coenzyme Q10 (COQ10) 100 MG capsule, Take 100 mg by mouth daily.   Marland Kitchen  glucose blood (ONETOUCH VERIO) test strip, USE 2 (TWO) TIMES DAILY. DX E11.9 *ONE TOUCH PER DR* .  Misc Natural Products (OSTEO BI-FLEX TRIPLE STRENGTH PO), Take by mouth. .  Multiple Vitamins-Minerals (WOMENS MULTIVITAMIN PO), Take 1 tablet by mouth daily. Glory Rosebush DELICA LANCETS 18E MISC, 1 each by Does not apply route 2 (two) times daily. Use to help check blood sugars twice a day Dx E11.9 .  potassium chloride (K-DUR) 10 MEQ tablet, TAKE 1 TABLET BY MOUTH EVERY DAY    Past medical history, social, surgical and family history all reviewed in electronic medical record.  No pertanent information unless stated regarding to the chief complaint.   Review of Systems:  No headache, visual changes, nausea, vomiting, diarrhea, constipation, dizziness, abdominal pain, skin rash, fevers, chills, night sweats, weight loss, swollen lymph nodes, body aches, joint swelling, muscle aches, chest pain, shortness of breath, mood changes.   Objective  Blood pressure 130/76, pulse 65, height 4\' 11"  (1.499 m), weight 193 lb (87.5 kg), SpO2 93 %.    General: No apparent distress alert and oriented x3 mood and affect normal, dressed appropriately.  HEENT: Pupils equal, extraocular movements intact  Respiratory: Patient's speak in full sentences and does not appear short of breath    Cardiovascular: 1+ lower extremity edema, non tender, no erythema  Skin: Warm dry intact with no signs of infection or rash on extremities or on axial skeleton.  Abdomen: Soft nontender  Neuro: Cranial nerves II through XII are intact, neurovascularly intact in all extremities with 2+ DTRs and 2+ pulses.  Lymph: No lymphadenopathy of posterior or anterior cervical chain or axillae bilaterally.  Gait antalgic MSK:  Non tender with full range of motion and good stability and symmetric strength and tone of shoulders, elbows, wrist, hip, knee and ankles bilaterally.  Mild arthritic changes of multiple joints   Left foot exam shows the patient does have severe breakdown of the transverse arch.  Mild swelling still noted over the MTP of the second toe.  Patient does have trace edema of the lower extremities bilaterally.  Patient does have some crepitus of the ankle movement.  Minor tenderness over the MTP joint of the second toe.  No bony abnormalities are noted.  Limited musculoskeletal ultrasound was performed and interpreted by Lyndal Pulley  Limited ultrasound shows the patient's second toe does have a capsulitis with a potential hemarthrosis.  No cortical defect in the surrounding area.  He does have overlying soft tissue swelling noted also most of the dorsal foot. Impression: Severe second toe sprain   Impression and Recommendations:     This case required medical decision making of moderate complexity. The above documentation has been reviewed and is accurate and complete Lyndal Pulley, DO       Note: This dictation was prepared with Dragon dictation along with smaller phrase technology. Any transcriptional errors that result from this process are unintentional.

## 2017-12-18 ENCOUNTER — Ambulatory Visit (INDEPENDENT_AMBULATORY_CARE_PROVIDER_SITE_OTHER): Payer: Medicare Other | Admitting: Family Medicine

## 2017-12-18 ENCOUNTER — Encounter: Payer: Self-pay | Admitting: Family Medicine

## 2017-12-18 ENCOUNTER — Ambulatory Visit: Payer: Self-pay

## 2017-12-18 VITALS — BP 130/76 | HR 65 | Ht 59.0 in | Wt 193.0 lb

## 2017-12-18 DIAGNOSIS — S93505A Unspecified sprain of left lesser toe(s), initial encounter: Secondary | ICD-10-CM | POA: Diagnosis not present

## 2017-12-18 DIAGNOSIS — M79672 Pain in left foot: Secondary | ICD-10-CM

## 2017-12-18 DIAGNOSIS — M79675 Pain in left toe(s): Secondary | ICD-10-CM

## 2017-12-18 HISTORY — DX: Unspecified sprain of left lesser toe(s), initial encounter: S93.505A

## 2017-12-18 NOTE — Assessment & Plan Note (Signed)
Sprain noted.  Discussed icing regimen and home exercises.  Discussed which activities to do which wants to avoid.  Increase activity slowly over the course the next several days.  We discussed proper shoewear, topical anti-inflammatories given, discussed that this would take time.  Follow-up again in 3 weeks to make sure it should be resolved or nearly resolved.

## 2017-12-18 NOTE — Patient Instructions (Addendum)
Nice to meet you  Rigid sole shoes even in the house for now.  pennsaid pinkie amount topically 2 times daily as needed.   .Buddy taping the toe would be a good idea.  It should do well but will take another 2-4 weeks See me again in 3 weeks to make sure you are better

## 2017-12-31 ENCOUNTER — Other Ambulatory Visit (INDEPENDENT_AMBULATORY_CARE_PROVIDER_SITE_OTHER): Payer: Medicare Other

## 2017-12-31 ENCOUNTER — Ambulatory Visit (INDEPENDENT_AMBULATORY_CARE_PROVIDER_SITE_OTHER): Payer: Medicare Other | Admitting: Internal Medicine

## 2017-12-31 ENCOUNTER — Encounter: Payer: Self-pay | Admitting: Internal Medicine

## 2017-12-31 VITALS — BP 124/82 | HR 87 | Temp 98.0°F | Ht 59.0 in | Wt 192.0 lb

## 2017-12-31 DIAGNOSIS — E119 Type 2 diabetes mellitus without complications: Secondary | ICD-10-CM

## 2017-12-31 DIAGNOSIS — E785 Hyperlipidemia, unspecified: Secondary | ICD-10-CM

## 2017-12-31 DIAGNOSIS — I10 Essential (primary) hypertension: Secondary | ICD-10-CM

## 2017-12-31 LAB — LIPID PANEL
Cholesterol: 186 mg/dL (ref 0–200)
HDL: 55.8 mg/dL (ref >39.00)
LDL Cholesterol: 112 mg/dL — ABNORMAL HIGH (ref 0–99)
NonHDL: 129.91
Total CHOL/HDL Ratio: 3
Triglycerides: 91 mg/dL (ref 0.0–149.0)
VLDL: 18.2 mg/dL (ref 0.0–40.0)

## 2017-12-31 LAB — BASIC METABOLIC PANEL
BUN: 16 mg/dL (ref 6–23)
CO2: 28 mEq/L (ref 19–32)
Calcium: 9.2 mg/dL (ref 8.4–10.5)
Chloride: 105 mEq/L (ref 96–112)
Creatinine, Ser: 0.98 mg/dL (ref 0.40–1.20)
GFR: 71.54 mL/min (ref >60.00)
Glucose, Bld: 92 mg/dL (ref 70–99)
Potassium: 3.8 mEq/L (ref 3.5–5.1)
Sodium: 142 mEq/L (ref 135–145)

## 2017-12-31 LAB — HEPATIC FUNCTION PANEL
ALT: 12 U/L (ref 0–35)
AST: 18 U/L (ref 0–37)
Albumin: 4.3 g/dL (ref 3.5–5.2)
Alkaline Phosphatase: 101 U/L (ref 39–117)
Bilirubin, Direct: 0.1 mg/dL (ref 0.0–0.3)
Total Bilirubin: 0.5 mg/dL (ref 0.2–1.2)
Total Protein: 7.1 g/dL (ref 6.0–8.3)

## 2017-12-31 LAB — HEMOGLOBIN A1C: Hgb A1c MFr Bld: 5.8 % (ref 4.6–6.5)

## 2017-12-31 NOTE — Progress Notes (Signed)
Subjective:    Patient ID: Brandi Shaffer, female    DOB: 09/15/1945, 72 y.o.   MRN: [id number]  HPI  Here to f/u; overall doing ok,  Pt denies chest pain, increasing sob or doe, wheezing, orthopnea, PND, increased LE swelling, palpitations, dizziness or syncope.  Pt denies new neurological symptoms such as new headache, or facial or extremity weakness or numbness.  Pt denies polydipsia, polyuria, or low sugar episode.  Pt states overall good compliance with meds, mostly trying to follow appropriate diet, with wt overall stable,  but little exercise however. Lost several lbs with better diet, very strict on lower chol as well.   Wt Readings from Last 3 Encounters:  12/31/17 192 lb (87.1 kg)  12/18/17 193 lb (87.5 kg)  11/15/17 196 lb (88.9 kg)  Denies urinary symptoms such as dysuria, frequency, urgency, flank pain, hematuria or n/v, fever, chills or even incontinence.   Past Medical History:  Diagnosis Date  . ANEMIA-IRON DEFICIENCY 12/06/2009  . Anxiety state 07/01/2015  . Asthma 12/05/2011  . DIABETES MELLITUS, TYPE II 09/17/2006  . DIVERTICULOSIS, COLON 12/06/2009  . GENITAL HERPES 03/24/2010  . GERD (gastroesophageal reflux disease) 06/28/2011  . HYPERLIPIDEMIA 03/24/2010  . HYPERTENSION 09/17/2006  . HYPOTHYROIDISM 09/17/2006  . LIPOMA 12/06/2009  . PERIPHERAL EDEMA 03/24/2010  . Renal cyst 07/05/2011   1.9 cm minimally complex   Past Surgical History:  Procedure Laterality Date  . ABDOMINAL HYSTERECTOMY     fibroids  . ankle surgury     x 3 left - chronic pain/swelling  . SHOULDER SURGERY Left   . TONSILLECTOMY    . TUBAL LIGATION      reports that she has never smoked. She has never used smokeless tobacco. She reports that she does not drink alcohol or use drugs. family history includes Diabetes in her other; Heart disease in her sister; Hypertension in her father and mother; Joint hypermobility in her other; Prostate cancer in her brother; Stroke in her sister. Allergies    Allergen Reactions  . Januvia [Sitagliptin Phosphate]     dizziness  . Metformin And Related Nausea Only  . Doxycycline Rash   Current Outpatient Medications on File Prior to Visit  Medication Sig Dispense Refill  . albuterol (PROAIR HFA) 108 (90 Base) MCG/ACT inhaler INHALE 2 PUFFS INTO THE LUNGS EVERY 6 (SIX) HOURS AS NEEDED FOR WHEEZING. 8.5 Inhaler 5  . amLODipine (NORVASC) 10 MG tablet TAKE 1 TABLET BY MOUTH EVERY DAY 90 tablet 2  . aspirin 81 MG EC tablet TAKE 1 TABLET BY MOUTH DAILY. SWALLOW WHOLE. 30 tablet 11  . Cholecalciferol (VITAMIN D) 400 UNITS tablet Take 400 Units by mouth daily.      . citalopram (CELEXA) 10 MG tablet TAKE 1 TABLET (10 MG TOTAL) BY MOUTH DAILY. 90 tablet 1  . Coenzyme Q10 (COQ10) 100 MG capsule Take 100 mg by mouth daily.      Marland Kitchen glucose blood (ONETOUCH VERIO) test strip USE 2 (TWO) TIMES DAILY. DX E11.9 *ONE TOUCH PER DR* 100 each 4  . ibuprofen (ADVIL,MOTRIN) 400 MG tablet TAKE 1 TABLET (400 MG TOTAL) BY MOUTH EVERY 6 (SIX) HOURS AS NEEDED FOR PAIN. 60 tablet 5  . ipratropium-albuterol (DUONEB) 0.5-2.5 (3) MG/3ML SOLN USE 1 VIAL VIA NEBULIZER EVERY 6 HOURS AS NEEDED 360 mL 2  . Misc Natural Products (OSTEO BI-FLEX TRIPLE STRENGTH PO) Take by mouth.    . Multiple Vitamins-Minerals (WOMENS MULTIVITAMIN PO) Take 1 tablet by mouth daily.    Marland Kitchen  olmesartan-hydrochlorothiazide (BENICAR HCT) 20-12.5 MG tablet Take 1 tablet by mouth daily. 90 tablet 3  . ONETOUCH DELICA LANCETS 33G MISC 1 each by Does not apply route 2 (two) times daily. Use to help check blood sugars twice a day Dx E11.9 200 each 11  . pioglitazone (ACTOS) 15 MG tablet TAKE 1 TABLET BY MOUTH EVERY DAY 90 tablet 1  . potassium chloride (K-DUR) 10 MEQ tablet TAKE 1 TABLET BY MOUTH EVERY DAY 90 tablet 1   No current facility-administered medications on file prior to visit.    Review of Systems  Constitutional: Negative for other unusual diaphoresis or sweats HENT: Negative for ear discharge or  swelling Eyes: Negative for other worsening visual disturbances Respiratory: Negative for stridor or other swelling  Gastrointestinal: Negative for worsening distension or other blood Genitourinary: Negative for retention or other urinary change Musculoskeletal: Negative for other MSK pain or swelling Skin: Negative for color change or other new lesions Neurological: Negative for worsening tremors and other numbness  Psychiatric/Behavioral: Negative for worsening agitation or other fatigue All other system neg per pt    Objective:   Physical Exam BP 124/82   Pulse 87   Temp 98 F (36.7 C) (Oral)   Ht 4\' 11"  (1.499 m)   Wt 192 lb (87.1 kg)   SpO2 98%   BMI 38.78 kg/m  VS noted,  Constitutional: Pt appears in NAD HENT: Head: NCAT.  Right Ear: External ear normal.  Left Ear: External ear normal.  Eyes: . Pupils are equal, round, and reactive to light. Conjunctivae and EOM are normal Nose: without d/c or deformity Neck: Neck supple. Gross normal ROM Cardiovascular: Normal rate and regular rhythm.   Pulmonary/Chest: Effort normal and breath sounds without rales or wheezing.  Abd:  Soft, NT, ND, + BS, no organomegaly Neurological: Pt is alert. At baseline orientation, motor grossly intact Skin: Skin is warm. No rashes, other new lesions, trace bilat LE edema Psychiatric: Pt behavior is normal without agitation  No other exam findings  Lab Results  Component Value Date   WBC 5.6 06/25/2017   HGB 13.3 06/25/2017   HCT 39.8 06/25/2017   PLT 243.0 06/25/2017   GLUCOSE 92 06/25/2017   CHOL 195 06/25/2017   TRIG 89.0 06/25/2017   HDL 54.00 06/25/2017   LDLDIRECT 134.6 05/23/2010   LDLCALC 123 (H) 06/25/2017   ALT 11 06/25/2017   AST 17 06/25/2017   NA 143 06/25/2017   K 4.2 06/25/2017   CL 107 06/25/2017   CREATININE 0.91 06/25/2017   BUN 19 06/25/2017   CO2 28 06/25/2017   TSH 2.28 06/25/2017   HGBA1C 5.9 06/25/2017   MICROALBUR 0.8 06/25/2017      Assessment &  Plan:

## 2017-12-31 NOTE — Patient Instructions (Signed)

## 2018-01-01 ENCOUNTER — Other Ambulatory Visit: Payer: Self-pay | Admitting: Internal Medicine

## 2018-01-01 DIAGNOSIS — H524 Presbyopia: Secondary | ICD-10-CM | POA: Diagnosis not present

## 2018-01-01 MED ORDER — ROSUVASTATIN CALCIUM 10 MG PO TABS: 10.0000 mg | ORAL_TABLET | Freq: Every day | ORAL | 3 refills | Status: DC

## 2018-01-02 ENCOUNTER — Telehealth: Payer: Self-pay

## 2018-01-02 NOTE — Telephone Encounter (Signed)
-----   Message from Biagio Borg, MD sent at 01/01/2018  1:18 PM EST ----- Left message on MyChart, pt to cont same tx except  The test results show that your current treatment is OK, except the LDL cholesterol is is mildly elevated.  It is not severe, but as the goal is for the LDL to be less than 70, we should start a statin medication called crestor 10 mg per day.  This will help reduce your future heart disease and stroke risk.  I will send the prescription, and you should hear from the office as well.     Shirron to please inform pt, I will do rx

## 2018-01-02 NOTE — Telephone Encounter (Signed)
Pt has viewed results via MyChart  

## 2018-01-03 NOTE — Assessment & Plan Note (Signed)
stable overall by history and exam, recent data reviewed with pt, and pt to continue medical treatment as before,  to f/u any worsening symptoms or concerns  

## 2018-01-07 NOTE — Progress Notes (Signed)
[facility] [place], [place] [address] Phone: [phone] Subjective:    I'm seeing this patient by the request  of:    CC: Foot pain follow-up  PPI:[id number]  Brandi Shaffer is a 72 y.o. female coming in with complaint of left great toe pain. Toe is getting better. Still a little TTP sometimes. Shoes make it painful. Overall though she would state that she is 60 to 70% better.  Not waking up at night with a significant amount of pain that she was having.     Past Medical History:  Diagnosis Date  . ANEMIA-IRON DEFICIENCY 12/06/2009  . Anxiety state 07/01/2015  . Asthma 12/05/2011  . DIABETES MELLITUS, TYPE II 09/17/2006  . DIVERTICULOSIS, COLON 12/06/2009  . GENITAL HERPES 03/24/2010  . GERD (gastroesophageal reflux disease) 06/28/2011  . HYPERLIPIDEMIA 03/24/2010  . HYPERTENSION 09/17/2006  . HYPOTHYROIDISM 09/17/2006  . LIPOMA 12/06/2009  . PERIPHERAL EDEMA 03/24/2010  . Renal cyst 07/05/2011   1.9 cm minimally complex   Past Surgical History:  Procedure Laterality Date  . ABDOMINAL HYSTERECTOMY     fibroids  . ankle surgury     x 3 left - chronic pain/swelling  . SHOULDER SURGERY Left   . TONSILLECTOMY    . TUBAL LIGATION     Social History   Socioeconomic History  . Marital status: Divorced    Spouse name: Not on file  . Number of children: 7  . Years of education: Not on file  . Highest education level: Not on file  Occupational History  . Occupation: Scientist, water quality    Comment: gas station, stands for work  Social Needs  . Financial resource strain: Not hard at all  . Food insecurity:    Worry: Never true    Inability: Never true  . Transportation needs:    Medical: No    Non-medical: No  Tobacco Use  . Smoking status: Never Smoker  . Smokeless tobacco: Never Used  Substance and Sexual Activity  . Alcohol use: No    Alcohol/week: 0.0 standard drinks  . Drug use: No  . Sexual activity: Not Currently  Lifestyle  .  Physical activity:    Days per week: 0 days    Minutes per session: 0 min  . Stress: Not at all  Relationships  . Social connections:    Talks on phone: More than three times a week    Gets together: More than three times a week    Attends religious service: Never    Active member of club or organization: Yes    Attends meetings of clubs or organizations: More than 4 times per year    Relationship status: Divorced  Other Topics Concern  . Not on file  Social History Narrative   Lives with grandson and and pets;    Have 6 sons and one dtr   Have grand- children; running children through out the day and cooking   Allergies  Allergen Reactions  . Januvia [Sitagliptin Phosphate]     dizziness  . Metformin And Related Nausea Only  . Doxycycline Rash   Family History  Problem Relation Age of Onset  . Hypertension Mother   . Hypertension Father   . Heart disease Sister        sudden death early 78's  . Stroke Sister   . Prostate cancer Brother        cancer  . Diabetes Other   . Joint hypermobility Other  DJD    Current Outpatient Medications (Endocrine & Metabolic):  .  pioglitazone (ACTOS) 15 MG tablet, TAKE 1 TABLET BY MOUTH EVERY DAY  Current Outpatient Medications (Cardiovascular):  .  amLODipine (NORVASC) 10 MG tablet, TAKE 1 TABLET BY MOUTH EVERY DAY .  olmesartan-hydrochlorothiazide (BENICAR HCT) 20-12.5 MG tablet, Take 1 tablet by mouth daily. .  rosuvastatin (CRESTOR) 10 MG tablet, Take 1 tablet (10 mg total) by mouth daily.  Current Outpatient Medications (Respiratory):  .  albuterol (PROAIR HFA) 108 (90 Base) MCG/ACT inhaler, INHALE 2 PUFFS INTO THE LUNGS EVERY 6 (SIX) HOURS AS NEEDED FOR WHEEZING. Marland Kitchen  ipratropium-albuterol (DUONEB) 0.5-2.5 (3) MG/3ML SOLN, USE 1 VIAL VIA NEBULIZER EVERY 6 HOURS AS NEEDED  Current Outpatient Medications (Analgesics):  .  aspirin 81 MG EC tablet, TAKE 1 TABLET BY MOUTH DAILY. SWALLOW WHOLE. Marland Kitchen  ibuprofen (ADVIL,MOTRIN)  400 MG tablet, TAKE 1 TABLET (400 MG TOTAL) BY MOUTH EVERY 6 (SIX) HOURS AS NEEDED FOR PAIN.   Current Outpatient Medications (Other):  Marland Kitchen  Cholecalciferol (VITAMIN D) 400 UNITS tablet, Take 400 Units by mouth daily.   .  citalopram (CELEXA) 10 MG tablet, TAKE 1 TABLET (10 MG TOTAL) BY MOUTH DAILY. Marland Kitchen  Coenzyme Q10 (COQ10) 100 MG capsule, Take 100 mg by mouth daily.   Marland Kitchen  glucose blood (ONETOUCH VERIO) test strip, USE 2 (TWO) TIMES DAILY. DX E11.9 *ONE TOUCH PER DR* .  Misc Natural Products (OSTEO BI-FLEX TRIPLE STRENGTH PO), Take by mouth. .  Multiple Vitamins-Minerals (WOMENS MULTIVITAMIN PO), Take 1 tablet by mouth daily. Glory Rosebush DELICA LANCETS 78L MISC, 1 each by Does not apply route 2 (two) times daily. Use to help check blood sugars twice a day Dx E11.9 .  potassium chloride (K-DUR) 10 MEQ tablet, TAKE 1 TABLET BY MOUTH EVERY DAY    Past medical history, social, surgical and family history all reviewed in electronic medical record.  No pertanent information unless stated regarding to the chief complaint.   Review of Systems:  No headache, visual changes, nausea, vomiting, diarrhea, constipation, dizziness, abdominal pain, skin rash, fevers, chills, night sweats, weight loss, swollen lymph nodes, body aches, joint swelling,, chest pain, shortness of breath, mood changes.  Positive muscle aches  Objective  Blood pressure 140/90, pulse 89, height 4\' 11"  (1.499 m), weight 191 lb (86.6 kg), SpO2 94 %.    General: No apparent distress alert and oriented x3 mood and affect normal, dressed appropriately.  HEENT: Pupils equal, extraocular movements intact  Respiratory: Patient's speak in full sentences and does not appear short of breath  Cardiovascular: No lower extremity edema, non tender, no erythema  Skin: Warm dry intact with no signs of infection or rash on extremities or on axial skeleton.  Abdomen: Soft nontender overweight Neuro: Cranial nerves II through XII are intact,  neurovascularly intact in all extremities with 2+ DTRs and 2+ pulses.  Lymph: No lymphadenopathy of posterior or anterior cervical chain or axillae bilaterally.  Gait antalgic.  MSK:  tender with full range of motion and good stability and symmetric strength and tone of shoulders, elbows, wrist, hip, knee and ankles bilaterally.  Mild to moderate arthritic changes of multiple joints  Foot exam shows the patient still has some mild swelling of the second MTP.  Patient first toe does have hallux rigidus.  No synovitis noted.  Less tenderness than previous exam but still mildly between the first and second toes.  Negative squeeze test.  Ankle does have overpronation of the hindfoot.  Impression and Recommendations:      The above documentation has been reviewed and is accurate and complete Brandi Pulley, DO       Note: This dictation was prepared with Dragon dictation along with smaller phrase technology. Any transcriptional errors that result from this process are unintentional.

## 2018-01-08 ENCOUNTER — Ambulatory Visit (INDEPENDENT_AMBULATORY_CARE_PROVIDER_SITE_OTHER): Payer: Medicare Other | Admitting: Family Medicine

## 2018-01-08 ENCOUNTER — Encounter: Payer: Self-pay | Admitting: Family Medicine

## 2018-01-08 DIAGNOSIS — S93505A Unspecified sprain of left lesser toe(s), initial encounter: Secondary | ICD-10-CM

## 2018-01-08 NOTE — Patient Instructions (Signed)
Good to see you  Brandi Shaffer is your friend Stay active Keep wearing good shoes Will take another 2 months Arnica lotion to the toe can help  See me again in 2 months

## 2018-01-08 NOTE — Assessment & Plan Note (Signed)
Patient is doing relatively well at this time.  Continue the hard sole shoes.  I do think it would continue to improve but is probably about 2 months out.  Follow-up again 4 weeks

## 2018-03-10 NOTE — Progress Notes (Signed)
[name] [place], [place] [address] Phone: [phone] Subjective:   Brandi Shaffer, am serving as a scribe for Dr. Hulan Saas.    CC: Toe pain follow-up,  [id number]  Brandi Shaffer is a 73 y.o. female coming in with complaint of left foot, second toe pain. Still notes a soreness. Has bruising over second toe. Feels like she cannot wear tighter shoes due to the pressure.       Past Medical History:  Diagnosis Date  . ANEMIA-IRON DEFICIENCY 12/06/2009  . Anxiety state 07/01/2015  . Asthma 12/05/2011  . DIABETES MELLITUS, TYPE II 09/17/2006  . DIVERTICULOSIS, COLON 12/06/2009  . GENITAL HERPES 03/24/2010  . GERD (gastroesophageal reflux disease) 06/28/2011  . HYPERLIPIDEMIA 03/24/2010  . HYPERTENSION 09/17/2006  . HYPOTHYROIDISM 09/17/2006  . LIPOMA 12/06/2009  . PERIPHERAL EDEMA 03/24/2010  . Renal cyst 07/05/2011   1.9 cm minimally complex   Past Surgical History:  Procedure Laterality Date  . ABDOMINAL HYSTERECTOMY     fibroids  . ankle surgury     x 3 left - chronic pain/swelling  . SHOULDER SURGERY Left   . TONSILLECTOMY    . TUBAL LIGATION     Social History   Socioeconomic History  . Marital status: Divorced    Spouse name: Not on file  . Number of children: 7  . Years of education: Not on file  . Highest education level: Not on file  Occupational History  . Occupation: Scientist, water quality    Comment: gas station, stands for work  Social Needs  . Financial resource strain: Not hard at all  . Food insecurity:    Worry: Never true    Inability: Never true  . Transportation needs:    Medical: Shaffer    Non-medical: Shaffer  Tobacco Use  . Smoking status: Never Smoker  . Smokeless tobacco: Never Used  Substance and Sexual Activity  . Alcohol use: Shaffer    Alcohol/week: 0.0 standard drinks  . Drug use: Shaffer  . Sexual activity: Not Currently  Lifestyle  . Physical activity:    Days per week: 0 days    Minutes per session: 0 min    . Stress: Not at all  Relationships  . Social connections:    Talks on phone: More than three times a week    Gets together: More than three times a week    Attends religious service: Never    Active member of club or organization: Yes    Attends meetings of clubs or organizations: More than 4 times per year    Relationship status: Divorced  Other Topics Concern  . Not on file  Social History Narrative   Lives with grandson and and pets;    Have 6 sons and one dtr   Have grand- children; running children through out the day and cooking   Allergies  Allergen Reactions  . Januvia [Sitagliptin Phosphate]     dizziness  . Metformin And Related Nausea Only  . Doxycycline Rash   Family History  Problem Relation Age of Onset  . Hypertension Mother   . Hypertension Father   . Heart disease Sister        sudden death early 73's  . Stroke Sister   . Prostate cancer Brother        cancer  . Diabetes Other   . Joint hypermobility Other        DJD    Current Outpatient Medications (Endocrine &  Metabolic):  .  pioglitazone (ACTOS) 15 MG tablet, TAKE 1 TABLET BY MOUTH EVERY DAY  Current Outpatient Medications (Cardiovascular):  .  amLODipine (NORVASC) 10 MG tablet, TAKE 1 TABLET BY MOUTH EVERY DAY .  olmesartan-hydrochlorothiazide (BENICAR HCT) 20-12.5 MG tablet, Take 1 tablet by mouth daily. .  rosuvastatin (CRESTOR) 10 MG tablet, Take 1 tablet (10 mg total) by mouth daily.  Current Outpatient Medications (Respiratory):  .  albuterol (PROAIR HFA) 108 (90 Base) MCG/ACT inhaler, INHALE 2 PUFFS INTO THE LUNGS EVERY 6 (SIX) HOURS AS NEEDED FOR WHEEZING. Marland Kitchen  ipratropium-albuterol (DUONEB) 0.5-2.5 (3) MG/3ML SOLN, USE 1 VIAL VIA NEBULIZER EVERY 6 HOURS AS NEEDED  Current Outpatient Medications (Analgesics):  .  aspirin 81 MG EC tablet, TAKE 1 TABLET BY MOUTH DAILY. SWALLOW WHOLE. Marland Kitchen  ibuprofen (ADVIL,MOTRIN) 400 MG tablet, TAKE 1 TABLET (400 MG TOTAL) BY MOUTH EVERY 6 (SIX) HOURS AS  NEEDED FOR PAIN.   Current Outpatient Medications (Other):  Marland Kitchen  Cholecalciferol (VITAMIN D) 400 UNITS tablet, Take 400 Units by mouth daily.   .  citalopram (CELEXA) 10 MG tablet, TAKE 1 TABLET (10 MG TOTAL) BY MOUTH DAILY. Marland Kitchen  Coenzyme Q10 (COQ10) 100 MG capsule, Take 100 mg by mouth daily.   Marland Kitchen  glucose blood (ONETOUCH VERIO) test strip, USE 2 (TWO) TIMES DAILY. DX E11.9 *ONE TOUCH PER DR* .  Misc Natural Products (OSTEO BI-FLEX TRIPLE STRENGTH PO), Take by mouth. .  Multiple Vitamins-Minerals (WOMENS MULTIVITAMIN PO), Take 1 tablet by mouth daily. Glory Rosebush DELICA LANCETS 10F MISC, 1 each by Does not apply route 2 (two) times daily. Use to help check blood sugars twice a day Dx E11.9 .  potassium chloride (K-DUR) 10 MEQ tablet, TAKE 1 TABLET BY MOUTH EVERY DAY    Past medical history, social, surgical and family history all reviewed in electronic medical record.  Shaffer pertanent information unless stated regarding to the chief complaint.   Review of Systems:  Shaffer headache, visual changes, nausea, vomiting, diarrhea, constipation, dizziness, abdominal pain, skin rash, fevers, chills, night sweats, weight loss, swollen lymph nodes, body aches, joint swelling, muscle aches, chest pain, shortness of breath, mood changes.   Objective  Blood pressure 138/80, pulse 80, height 4\' 11"  (1.499 m), weight 192 lb (87.1 kg), SpO2 98 %.    General: Shaffer apparent distress alert and oriented x3 mood and affect normal, dressed appropriately.  HEENT: Pupils equal, extraocular movements intact  Respiratory: Patient's speak in full sentences and does not appear short of breath  Cardiovascular: Shaffer lower extremity edema, non tender, Shaffer erythema  Skin: Warm dry intact with Shaffer signs of infection or rash on extremities or on axial skeleton.  Abdomen: Soft nontender  Neuro: Cranial nerves II through XII are intact, neurovascularly intact in all extremities with 2+ DTRs  Lymph: Shaffer lymphadenopathy of posterior or  anterior cervical chain or axillae bilaterally.  Gait normal with good balance and coordination.  MSK:  Non tender with full range of motion and good stability and symmetric strength and tone of shoulders, elbows, wrist, hip, knee and ankles bilaterally.  Arthritic changes of multipel joints Foot exam left foot mild breakdown of transverse arch  Discoloration of second toe still exist but Shaffer swelling. Mild TTP  Hx of surgery of left annkle with 1 + post tib pulse   Ltd MSk US shows toe unremarkable, mild synovitis otherwise unremarkable.      Impression and Recommendations:      The above documentation has been  reviewed and is accurate and complete Lyndal Pulley, DO       Note: This dictation was prepared with Dragon dictation along with smaller phrase technology. Any transcriptional errors that result from this process are unintentional.

## 2018-03-11 ENCOUNTER — Ambulatory Visit (INDEPENDENT_AMBULATORY_CARE_PROVIDER_SITE_OTHER)
Admission: RE | Admit: 2018-03-11 | Discharge: 2018-03-11 | Disposition: A | Discharge status: Home / Self Care | Payer: Medicare Other | Source: Ambulatory Visit | Attending: Family Medicine | Admitting: Family Medicine

## 2018-03-11 ENCOUNTER — Ambulatory Visit: Payer: Self-pay

## 2018-03-11 ENCOUNTER — Encounter: Payer: Self-pay | Admitting: Family Medicine

## 2018-03-11 ENCOUNTER — Ambulatory Visit (INDEPENDENT_AMBULATORY_CARE_PROVIDER_SITE_OTHER): Payer: Medicare Other | Admitting: Family Medicine

## 2018-03-11 VITALS — BP 138/80 | HR 80 | Ht 59.0 in | Wt 192.0 lb

## 2018-03-11 DIAGNOSIS — M79672 Pain in left foot: Secondary | ICD-10-CM

## 2018-03-11 DIAGNOSIS — M19072 Primary osteoarthritis, left ankle and foot: Secondary | ICD-10-CM | POA: Diagnosis not present

## 2018-03-11 DIAGNOSIS — M79675 Pain in left toe(s): Secondary | ICD-10-CM | POA: Diagnosis not present

## 2018-03-11 DIAGNOSIS — S93505A Unspecified sprain of left lesser toe(s), initial encounter: Secondary | ICD-10-CM

## 2018-03-11 NOTE — Assessment & Plan Note (Signed)
Improvement noted, mild discoloration  Discussed xray and ABI to make sure it is better  RTC in 3 weeks if not better

## 2018-03-11 NOTE — Patient Instructions (Signed)
Good to see you  Ice is your friend still  We will get xray downstairs Also will get a test to check blood flow of legs and they will call you  We will write you in my chart afterward and if all is well we know you will heal just fine.  Happy New Year!

## 2018-03-14 ENCOUNTER — Other Ambulatory Visit: Payer: Self-pay | Admitting: Family Medicine

## 2018-03-14 DIAGNOSIS — M79672 Pain in left foot: Secondary | ICD-10-CM

## 2018-03-19 ENCOUNTER — Ambulatory Visit (HOSPITAL_COMMUNITY)
Admission: RE | Admit: 2018-03-19 | Discharge: 2018-03-19 | Disposition: A | Discharge status: Home / Self Care | Payer: Medicare Other | Source: Ambulatory Visit | Attending: Cardiology | Admitting: Cardiology

## 2018-03-19 ENCOUNTER — Other Ambulatory Visit: Payer: Self-pay | Admitting: Family Medicine

## 2018-03-19 DIAGNOSIS — M79672 Pain in left foot: Secondary | ICD-10-CM

## 2018-04-14 ENCOUNTER — Other Ambulatory Visit: Payer: Self-pay | Admitting: Internal Medicine

## 2018-05-01 ENCOUNTER — Other Ambulatory Visit: Payer: Self-pay | Admitting: Internal Medicine

## 2018-05-10 ENCOUNTER — Other Ambulatory Visit: Payer: Self-pay | Admitting: Internal Medicine

## 2018-05-19 ENCOUNTER — Other Ambulatory Visit: Payer: Self-pay | Admitting: Internal Medicine

## 2018-06-11 ENCOUNTER — Other Ambulatory Visit: Payer: Self-pay | Admitting: Internal Medicine

## 2018-06-11 DIAGNOSIS — Z1231 Encounter for screening mammogram for malignant neoplasm of breast: Secondary | ICD-10-CM

## 2018-07-03 ENCOUNTER — Ambulatory Visit (INDEPENDENT_AMBULATORY_CARE_PROVIDER_SITE_OTHER): Payer: Medicare Other | Admitting: Internal Medicine

## 2018-07-03 ENCOUNTER — Other Ambulatory Visit (INDEPENDENT_AMBULATORY_CARE_PROVIDER_SITE_OTHER): Payer: Medicare Other

## 2018-07-03 ENCOUNTER — Other Ambulatory Visit: Payer: Self-pay | Admitting: Internal Medicine

## 2018-07-03 ENCOUNTER — Telehealth: Payer: Self-pay

## 2018-07-03 ENCOUNTER — Encounter: Payer: Self-pay | Admitting: Internal Medicine

## 2018-07-03 DIAGNOSIS — I10 Essential (primary) hypertension: Secondary | ICD-10-CM

## 2018-07-03 DIAGNOSIS — E785 Hyperlipidemia, unspecified: Secondary | ICD-10-CM

## 2018-07-03 DIAGNOSIS — E119 Type 2 diabetes mellitus without complications: Secondary | ICD-10-CM | POA: Diagnosis not present

## 2018-07-03 LAB — LIPID PANEL
Cholesterol: 175 mg/dL (ref 0–200)
HDL: 52 mg/dL (ref >39.00)
LDL Cholesterol: 104 mg/dL — ABNORMAL HIGH (ref 0–99)
NonHDL: 123.25
Total CHOL/HDL Ratio: 3
Triglycerides: 94 mg/dL (ref 0.0–149.0)
VLDL: 18.8 mg/dL (ref 0.0–40.0)

## 2018-07-03 LAB — CBC WITH DIFFERENTIAL/PLATELET
Basophils Absolute: 0 10*3/uL (ref 0.0–0.1)
Basophils Relative: 0.8 % (ref 0.0–3.0)
Eosinophils Absolute: 0.2 10*3/uL (ref 0.0–0.7)
Eosinophils Relative: 3.6 % (ref 0.0–5.0)
HCT: 38.2 % (ref 36.0–46.0)
Hemoglobin: 12.8 g/dL (ref 12.0–15.0)
Lymphocytes Relative: 46.8 % — ABNORMAL HIGH (ref 12.0–46.0)
Lymphs Abs: 2.6 10*3/uL (ref 0.7–4.0)
MCHC: 33.4 g/dL (ref 30.0–36.0)
MCV: 92.5 fl (ref 78.0–100.0)
Monocytes Absolute: 0.5 10*3/uL (ref 0.1–1.0)
Monocytes Relative: 9.3 % (ref 3.0–12.0)
Neutro Abs: 2.2 10*3/uL (ref 1.4–7.7)
Neutrophils Relative %: 39.5 % — ABNORMAL LOW (ref 43.0–77.0)
Platelets: 285 10*3/uL (ref 150.0–400.0)
RBC: 4.13 Mil/uL (ref 3.87–5.11)
RDW: 14.1 % (ref 11.5–15.5)
WBC: 5.6 10*3/uL (ref 4.0–10.5)

## 2018-07-03 LAB — BASIC METABOLIC PANEL
BUN: 22 mg/dL (ref 6–23)
CO2: 28 mEq/L (ref 19–32)
Calcium: 8.8 mg/dL (ref 8.4–10.5)
Chloride: 105 mEq/L (ref 96–112)
Creatinine, Ser: 0.9 mg/dL (ref 0.40–1.20)
GFR: 74.15 mL/min (ref >60.00)
Glucose, Bld: 83 mg/dL (ref 70–99)
Potassium: 3.8 mEq/L (ref 3.5–5.1)
Sodium: 141 mEq/L (ref 135–145)

## 2018-07-03 LAB — URINALYSIS, ROUTINE W REFLEX MICROSCOPIC
Bilirubin Urine: NEGATIVE
Hgb urine dipstick: NEGATIVE
Ketones, ur: NEGATIVE
Nitrite: NEGATIVE
RBC / HPF: NONE SEEN (ref 0–?)
Specific Gravity, Urine: 1.02 (ref 1.000–1.030)
Total Protein, Urine: NEGATIVE
Urine Glucose: NEGATIVE
Urobilinogen, UA: 0.2 (ref 0.0–1.0)
pH: 7.5 (ref 5.0–8.0)

## 2018-07-03 LAB — TSH: TSH: 2.21 u[IU]/mL (ref 0.35–4.50)

## 2018-07-03 LAB — MICROALBUMIN / CREATININE URINE RATIO
Creatinine,U: 81.6 mg/dL
Microalb Creat Ratio: 1.3 mg/g (ref 0.0–30.0)
Microalb, Ur: 1.1 mg/dL (ref 0.0–1.9)

## 2018-07-03 LAB — HEMOGLOBIN A1C: Hgb A1c MFr Bld: 5.9 % (ref 4.6–6.5)

## 2018-07-03 LAB — HEPATIC FUNCTION PANEL
ALT: 9 U/L (ref 0–35)
AST: 12 U/L (ref 0–37)
Albumin: 4.1 g/dL (ref 3.5–5.2)
Alkaline Phosphatase: 116 U/L (ref 39–117)
Bilirubin, Direct: 0.1 mg/dL (ref 0.0–0.3)
Total Bilirubin: 0.4 mg/dL (ref 0.2–1.2)
Total Protein: 7 g/dL (ref 6.0–8.3)

## 2018-07-03 MED ORDER — ROSUVASTATIN CALCIUM 20 MG PO TABS: 20.0000 mg | ORAL_TABLET | Freq: Every day | ORAL | 3 refills | Status: DC

## 2018-07-03 NOTE — Assessment & Plan Note (Signed)
stable overall by history, recent data reviewed with pt, and pt to continue medical treatment as before,  to f/u any worsening symptoms or concerns

## 2018-07-03 NOTE — Assessment & Plan Note (Signed)
stable overall by history and exam, recent data reviewed with pt, and pt to continue medical treatment as before,  to f/u any worsening symptoms or concerns. For lipids with labs, goal ldl < 70, consider start statin

## 2018-07-03 NOTE — Patient Instructions (Signed)
Please continue all other medications as before, and refills have been done if requested.  Please have the pharmacy call with any other refills you may need.  Please continue your efforts at being more active, low cholesterol diet, and weight control.  Please keep your appointments with your specialists as you may have planned  Please go to the LAB in the Basement (turn left off the elevator) for the tests to be done at your convenience  You will be contacted by phone if any changes need to be made immediately.  Otherwise, you will receive a letter about your results with an explanation, but please check with MyChart first.  Please remember to sign up for MyChart if you have not done so, as this will be important to you in the future with finding out test results, communicating by private email, and scheduling acute appointments online when needed. 

## 2018-07-03 NOTE — Telephone Encounter (Signed)
Pt has been informed of results and expressed understanding.  °

## 2018-07-03 NOTE — Assessment & Plan Note (Signed)
stable overall by history and exam, recent data reviewed with pt, and pt to continue medical treatment as before,  to f/u any worsening symptoms or concerns, for a1c with labs 

## 2018-07-03 NOTE — Telephone Encounter (Signed)
-----   Message from Biagio Borg, MD sent at 07/03/2018 12:37 PM EDT ----- Left message on MyChart, pt to cont same tx except  The test results show that your current treatment is OK, except the LDL cholesterol is still mildly elevated  We should increase your crestor to 20 mg per day.  I will send a new prescription, and you should hear from the office as well.   Yancy Hascall to please inform pt, I will do rx

## 2018-07-03 NOTE — Progress Notes (Signed)
Patient ID: Brandi Shaffer, female   DOB: 07-Apr-1945, 73 y.o.   MRN: [id number]  Phone Visit:    Cumulative time during 7-day interval 25 min, there was not an associated office visit for this concern within a 7 day period.  Verbal consent for services obtained from patient prior to services given.  Names of all persons present for services: Oliver Barre, MD, patient  Chief complaint: general medical diagnoses  History: Here to f/u; overall doing ok,  Pt denies chest pain, increasing sob or doe, wheezing, orthopnea, PND, increased LE swelling, palpitations, dizziness or syncope.  Pt denies new neurological symptoms such as new headache, or facial or extremity weakness or numbness.  Pt denies polydipsia, polyuria, or low sugar episode.  Pt states overall good compliance with meds, mostly trying to follow appropriate diet, with wt overall stable,  but little exercise however.  BP at home < 140/90.  Has been able to lose 6 lbs recently with better diet. Not taking the statin but thinking about starting if the LDL is elevated with this visit, has been working on low chol diet  No new complaints Past Medical History:  Diagnosis Date  . ANEMIA-IRON DEFICIENCY 12/06/2009  . Anxiety state 07/01/2015  . Asthma 12/05/2011  . DIABETES MELLITUS, TYPE II 09/17/2006  . DIVERTICULOSIS, COLON 12/06/2009  . GENITAL HERPES 03/24/2010  . GERD (gastroesophageal reflux disease) 06/28/2011  . HYPERLIPIDEMIA 03/24/2010  . HYPERTENSION 09/17/2006  . HYPOTHYROIDISM 09/17/2006  . LIPOMA 12/06/2009  . PERIPHERAL EDEMA 03/24/2010  . Renal cyst 07/05/2011   1.9 cm minimally complex   No results found for this or any previous visit (from the past 48 hour(s)). Current Outpatient Medications on File Prior to Visit  Medication Sig Dispense Refill  . albuterol (PROAIR HFA) 108 (90 Base) MCG/ACT inhaler INHALE 2 PUFFS INTO THE LUNGS EVERY 6 (SIX) HOURS AS NEEDED FOR WHEEZING. 8.5 Inhaler 5  . amLODipine (NORVASC) 10 MG tablet TAKE 1  TABLET BY MOUTH EVERY DAY 90 tablet 1  . aspirin 81 MG EC tablet TAKE 1 TABLET BY MOUTH DAILY. SWALLOW WHOLE. 30 tablet 11  . Cholecalciferol (VITAMIN D) 400 UNITS tablet Take 400 Units by mouth daily.      . citalopram (CELEXA) 10 MG tablet TAKE 1 TABLET (10 MG TOTAL) BY MOUTH DAILY. 90 tablet 1  . Coenzyme Q10 (COQ10) 100 MG capsule Take 100 mg by mouth daily.      Marland Kitchen glucose blood (ONETOUCH VERIO) test strip USE 2 (TWO) TIMES DAILY. DX E11.9 *ONE TOUCH PER DR* 100 each 4  . ibuprofen (ADVIL,MOTRIN) 400 MG tablet TAKE 1 TABLET (400 MG TOTAL) BY MOUTH EVERY 6 (SIX) HOURS AS NEEDED FOR PAIN. 60 tablet 5  . ipratropium-albuterol (DUONEB) 0.5-2.5 (3) MG/3ML SOLN USE 1 VIAL VIA NEBULIZER EVERY 6 HOURS AS NEEDED 360 mL 2  . Misc Natural Products (OSTEO BI-FLEX TRIPLE STRENGTH PO) Take by mouth.    . Multiple Vitamins-Minerals (WOMENS MULTIVITAMIN PO) Take 1 tablet by mouth daily.    Marland Kitchen olmesartan-hydrochlorothiazide (BENICAR HCT) 20-12.5 MG tablet TAKE 1 TABLET BY MOUTH EVERY DAY 90 tablet 3  . ONETOUCH DELICA LANCETS 33G MISC 1 each by Does not apply route 2 (two) times daily. Use to help check blood sugars twice a day Dx E11.9 200 each 11  . pioglitazone (ACTOS) 15 MG tablet TAKE 1 TABLET BY MOUTH EVERY DAY 90 tablet 1  . potassium chloride (K-DUR) 10 MEQ tablet TAKE 1 TABLET BY MOUTH EVERY DAY 90  tablet 1  . rosuvastatin (CRESTOR) 10 MG tablet TAKE 1 TABLET BY MOUTH EVERY DAY 90 tablet 1   No current facility-administered medications on file prior to visit.      Lab Results  Component Value Date   WBC 5.6 06/25/2017   HGB 13.3 06/25/2017   HCT 39.8 06/25/2017   PLT 243.0 06/25/2017   GLUCOSE 92 12/31/2017   CHOL 186 12/31/2017   TRIG 91.0 12/31/2017   HDL 55.80 12/31/2017   LDLDIRECT 134.6 05/23/2010   LDLCALC 112 (H) 12/31/2017   ALT 12 12/31/2017   AST 18 12/31/2017   NA 142 12/31/2017   K 3.8 12/31/2017   CL 105 12/31/2017   CREATININE 0.98 12/31/2017   BUN 16 12/31/2017   CO2  28 12/31/2017   TSH 2.28 06/25/2017   HGBA1C 5.8 12/31/2017   MICROALBUR 0.8 06/25/2017   A/P/next steps:  See notes

## 2018-07-09 NOTE — Progress Notes (Signed)
[facility] [place], [place] [address] Phone: [phone] Subjective:   I Brandi Shaffer am serving as a Education administrator for Dr. Hulan Saas.    CC: left heel pain   [id number]   03/11/2018 Improvement noted, mild discoloration  Discussed xray and ABI to make sure it is better  RTC in 3 weeks if not better   07/10/2018 Brandi Shaffer is a 73 y.o. female coming in with complaint of right heel pain. States the heel is painful with walking. States this is a pain she's never had before. States that she is diabetic so she is just being cautious. Changes shoes often because nothing is comfortable.   -     Past Medical History:  Diagnosis Date  . ANEMIA-IRON DEFICIENCY 12/06/2009  . Anxiety state 07/01/2015  . Asthma 12/05/2011  . DIABETES MELLITUS, TYPE II 09/17/2006  . DIVERTICULOSIS, COLON 12/06/2009  . GENITAL HERPES 03/24/2010  . GERD (gastroesophageal reflux disease) 06/28/2011  . HYPERLIPIDEMIA 03/24/2010  . HYPERTENSION 09/17/2006  . HYPOTHYROIDISM 09/17/2006  . LIPOMA 12/06/2009  . PERIPHERAL EDEMA 03/24/2010  . Renal cyst 07/05/2011   1.9 cm minimally complex   Past Surgical History:  Procedure Laterality Date  . ABDOMINAL HYSTERECTOMY     fibroids  . ankle surgury     x 3 left - chronic pain/swelling  . SHOULDER SURGERY Left   . TONSILLECTOMY    . TUBAL LIGATION     Social History   Socioeconomic History  . Marital status: Divorced    Spouse name: Not on file  . Number of children: 7  . Years of education: Not on file  . Highest education level: Not on file  Occupational History  . Occupation: Scientist, water quality    Comment: gas station, stands for work  Social Needs  . Financial resource strain: Not hard at all  . Food insecurity:    Worry: Never true    Inability: Never true  . Transportation needs:    Medical: No    Non-medical: No  Tobacco Use  . Smoking status: Never Smoker  . Smokeless tobacco: Never Used  Substance and  Sexual Activity  . Alcohol use: No    Alcohol/week: 0.0 standard drinks  . Drug use: No  . Sexual activity: Not Currently  Lifestyle  . Physical activity:    Days per week: 0 days    Minutes per session: 0 min  . Stress: Not at all  Relationships  . Social connections:    Talks on phone: More than three times a week    Gets together: More than three times a week    Attends religious service: Never    Active member of club or organization: Yes    Attends meetings of clubs or organizations: More than 4 times per year    Relationship status: Divorced  Other Topics Concern  . Not on file  Social History Narrative   Lives with grandson and and pets;    Have 6 sons and one dtr   Have grand- children; running children through out the day and cooking   Allergies  Allergen Reactions  . Januvia [Sitagliptin Phosphate]     dizziness  . Metformin And Related Nausea Only  . Doxycycline Rash   Family History  Problem Relation Age of Onset  . Hypertension Mother   . Hypertension Father   . Heart disease Sister        sudden death early 98's  .  Stroke Sister   . Prostate cancer Brother        cancer  . Diabetes Other   . Joint hypermobility Other        DJD    Current Outpatient Medications (Endocrine & Metabolic):  .  pioglitazone (ACTOS) 15 MG tablet, TAKE 1 TABLET BY MOUTH EVERY DAY  Current Outpatient Medications (Cardiovascular):  .  amLODipine (NORVASC) 10 MG tablet, TAKE 1 TABLET BY MOUTH EVERY DAY .  olmesartan-hydrochlorothiazide (BENICAR HCT) 20-12.5 MG tablet, TAKE 1 TABLET BY MOUTH EVERY DAY .  rosuvastatin (CRESTOR) 20 MG tablet, Take 1 tablet (20 mg total) by mouth daily. .  furosemide (LASIX) 20 MG tablet, Take 1 tablet (20 mg total) by mouth daily.  Current Outpatient Medications (Respiratory):  .  albuterol (PROAIR HFA) 108 (90 Base) MCG/ACT inhaler, INHALE 2 PUFFS INTO THE LUNGS EVERY 6 (SIX) HOURS AS NEEDED FOR WHEEZING. Marland Kitchen  ipratropium-albuterol (DUONEB)  0.5-2.5 (3) MG/3ML SOLN, USE 1 VIAL VIA NEBULIZER EVERY 6 HOURS AS NEEDED  Current Outpatient Medications (Analgesics):  .  aspirin 81 MG EC tablet, TAKE 1 TABLET BY MOUTH DAILY. SWALLOW WHOLE. Marland Kitchen  ibuprofen (ADVIL,MOTRIN) 400 MG tablet, TAKE 1 TABLET (400 MG TOTAL) BY MOUTH EVERY 6 (SIX) HOURS AS NEEDED FOR PAIN.   Current Outpatient Medications (Other):  Marland Kitchen  Cholecalciferol (VITAMIN D) 400 UNITS tablet, Take 400 Units by mouth daily.   .  citalopram (CELEXA) 10 MG tablet, TAKE 1 TABLET (10 MG TOTAL) BY MOUTH DAILY. Marland Kitchen  Coenzyme Q10 (COQ10) 100 MG capsule, Take 100 mg by mouth daily.   Marland Kitchen  glucose blood (ONETOUCH VERIO) test strip, USE 2 (TWO) TIMES DAILY. DX E11.9 *ONE TOUCH PER DR* .  Misc Natural Products (OSTEO BI-FLEX TRIPLE STRENGTH PO), Take by mouth. .  Multiple Vitamins-Minerals (WOMENS MULTIVITAMIN PO), Take 1 tablet by mouth daily. Glory Rosebush DELICA LANCETS 32R MISC, 1 each by Does not apply route 2 (two) times daily. Use to help check blood sugars twice a day Dx E11.9 .  potassium chloride (K-DUR) 10 MEQ tablet, TAKE 1 TABLET BY MOUTH EVERY DAY    Past medical history, social, surgical and family history all reviewed in electronic medical record.  No pertanent information unless stated regarding to the chief complaint.   Review of Systems:  No headache, visual changes, nausea, vomiting, diarrhea, constipation, dizziness, abdominal pain, skin rash, fevers, chills, night sweats, weight loss, swollen lymph nodes, body aches, joint swelling,  chest pain, shortness of breath, mood changes.  Positive muscle aches  Objective  Blood pressure 130/82, pulse 85, height 4\' 11"  (1.499 m), weight 193 lb (87.5 kg), SpO2 95 %.    General: No apparent distress alert and oriented x3 mood and affect normal, dressed appropriately.  HEENT: Pupils equal, extraocular movements intact  Respiratory: Patient's speak in full sentences and does not appear short of breath  Cardiovascular: 2+ lower  extremity edema, non tender, no erythema  Skin: Warm dry intact with no signs of infection or rash on extremities or on axial skeleton.  Abdomen: Soft nontender  Neuro: Cranial nerves II through XII are intact, neurovascularly intact in all extremities with 2+ DTRs and 2+ pulses.  Lymph: No lymphadenopathy of posterior or anterior cervical chain or axillae bilaterally.  Gait normal with good balance and coordination.  MSK:  Non tender with full range of motion and good stability and symmetric strength and tone of shoulders, elbows, wrist, hip, knee bilaterally.  Ankle: Left ankle He does have a  pes planus.  Overpronation of the hindfoot.  Arthritic changes noted.  Mild crepitus noted with range of motion.  Patient is nontender of the Achilles area.  Minimal tenderness to palpation.  Patient noted does have 2+ edema of the lower extremities.   MSK US performed of: Left foot and ankle This study was ordered, performed, and interpreted by Charlann Boxer D.O.  Foot/Ankle:   Mild arthritic changes.  Mild enlargement of the plantar fascia also noted.  Patient noted does have a decent amount of soft tissue swelling that seems to be more secondary to dependent edema  IMPRESSION: Dependent edema plus mild enlargement of plantar fascia      Impression and Recommendations:     This case required medical decision making of moderate complexity. The above documentation has been reviewed and is accurate and complete Lyndal Pulley, DO       Note: This dictation was prepared with Dragon dictation along with smaller phrase technology. Any transcriptional errors that result from this process are unintentional.

## 2018-07-10 ENCOUNTER — Ambulatory Visit (INDEPENDENT_AMBULATORY_CARE_PROVIDER_SITE_OTHER): Payer: Medicare Other | Admitting: Family Medicine

## 2018-07-10 ENCOUNTER — Encounter: Payer: Self-pay | Admitting: Family Medicine

## 2018-07-10 ENCOUNTER — Ambulatory Visit: Payer: Self-pay

## 2018-07-10 ENCOUNTER — Other Ambulatory Visit: Payer: Self-pay

## 2018-07-10 VITALS — BP 130/82 | HR 85 | Ht 59.0 in | Wt 193.0 lb

## 2018-07-10 DIAGNOSIS — M79672 Pain in left foot: Secondary | ICD-10-CM | POA: Diagnosis not present

## 2018-07-10 DIAGNOSIS — M79604 Pain in right leg: Secondary | ICD-10-CM

## 2018-07-10 DIAGNOSIS — G8929 Other chronic pain: Secondary | ICD-10-CM

## 2018-07-10 MED ORDER — FUROSEMIDE 20 MG PO TABS: 20.0000 mg | ORAL_TABLET | Freq: Every day | ORAL | 0 refills | Status: DC

## 2018-07-10 NOTE — Patient Instructions (Signed)
Good to see you  Small heel lift in shoe may help  Avoid being barefoot Exercises 3 times a week.  Lasix start with half a pill and take daily for 3 days at a time for the swelling.  If swelling continues after that then we will need you to see Dr. Jenny Reichmann  See me again in 4 weeks Be safe

## 2018-07-10 NOTE — Assessment & Plan Note (Signed)
I believe the patient's right leg and heel pain is secondary to dependent edema.  Lasix given today.  We will do it in a short course.  Patient's kidney function is doing well.  We discussed compression socks.  Discussed proper shoes.  Discussed home exercise with range of motion of the ankle.  This will help with some of the edema.  Discussed when sitting attempt to get above heart.  Follow-up again 6 weeks

## 2018-08-01 ENCOUNTER — Other Ambulatory Visit: Payer: Self-pay | Admitting: Family Medicine

## 2018-08-07 ENCOUNTER — Ambulatory Visit: Payer: Medicare Other | Admitting: Family Medicine

## 2018-08-11 ENCOUNTER — Other Ambulatory Visit: Payer: Self-pay

## 2018-08-11 ENCOUNTER — Encounter: Payer: Self-pay | Admitting: Family Medicine

## 2018-08-11 ENCOUNTER — Ambulatory Visit
Admission: RE | Admit: 2018-08-11 | Discharge: 2018-08-11 | Disposition: A | Discharge status: Home / Self Care | Payer: Medicare Other | Source: Ambulatory Visit | Attending: Internal Medicine | Admitting: Internal Medicine

## 2018-08-11 ENCOUNTER — Ambulatory Visit (INDEPENDENT_AMBULATORY_CARE_PROVIDER_SITE_OTHER): Payer: Medicare Other | Admitting: Family Medicine

## 2018-08-11 DIAGNOSIS — M79671 Pain in right foot: Secondary | ICD-10-CM | POA: Diagnosis not present

## 2018-08-11 DIAGNOSIS — Z1231 Encounter for screening mammogram for malignant neoplasm of breast: Secondary | ICD-10-CM | POA: Diagnosis not present

## 2018-08-11 NOTE — Patient Instructions (Addendum)
Good to see you.  Turmeric 500mg  daily  Tart cherry extract 1200mg  at night Keep up with the other vitamins Exercises 2 times a week See me again in 8 weeks if you need me

## 2018-08-11 NOTE — Progress Notes (Signed)
[name] [address], [place] [address] Phone: [phone] Subjective:   I Brandi Shaffer am serving as a Education administrator for Dr. Hulan Saas.   CC: Foot pain follow-up  [id number]   07/10/2018 I believe the patient's right leg and heel pain is secondary to dependent edema.  Lasix given today.  We will do it in a short course.  Patient's kidney function is doing well.  We discussed compression socks.  Discussed proper shoes.  Discussed home exercise with range of motion of the ankle.  This will help with some of the edema.  Discussed when sitting attempt to get above heart.  Follow-up again 6 weeks  08/11/2018 Brandi Shaffer is a 73 y.o. female coming in with complaint of left heel pain. States her heel is doing better.  Patient states that she is feeling 70% better.  Not having significant amount of pain.  New shoes and over-the-counter orthotics have been very beneficial.     Past Medical History:  Diagnosis Date  . ANEMIA-IRON DEFICIENCY 12/06/2009  . Anxiety state 07/01/2015  . Asthma 12/05/2011  . DIABETES MELLITUS, TYPE II 09/17/2006  . DIVERTICULOSIS, COLON 12/06/2009  . GENITAL HERPES 03/24/2010  . GERD (gastroesophageal reflux disease) 06/28/2011  . HYPERLIPIDEMIA 03/24/2010  . HYPERTENSION 09/17/2006  . HYPOTHYROIDISM 09/17/2006  . LIPOMA 12/06/2009  . PERIPHERAL EDEMA 03/24/2010  . Renal cyst 07/05/2011   1.9 cm minimally complex   Past Surgical History:  Procedure Laterality Date  . ABDOMINAL HYSTERECTOMY     fibroids  . ankle surgury     x 3 left - chronic pain/swelling  . SHOULDER SURGERY Left   . TONSILLECTOMY    . TUBAL LIGATION     Social History   Socioeconomic History  . Marital status: Divorced    Spouse name: Not on file  . Number of children: 7  . Years of education: Not on file  . Highest education level: Not on file  Occupational History  . Occupation: Scientist, water quality    Comment: gas station, stands for work  Social Needs   . Financial resource strain: Not hard at all  . Food insecurity    Worry: Never true    Inability: Never true  . Transportation needs    Medical: No    Non-medical: No  Tobacco Use  . Smoking status: Never Smoker  . Smokeless tobacco: Never Used  Substance and Sexual Activity  . Alcohol use: No    Alcohol/week: 0.0 standard drinks  . Drug use: No  . Sexual activity: Not Currently  Lifestyle  . Physical activity    Days per week: 0 days    Minutes per session: 0 min  . Stress: Not at all  Relationships  . Social connections    Talks on phone: More than three times a week    Gets together: More than three times a week    Attends religious service: Never    Active member of club or organization: Yes    Attends meetings of clubs or organizations: More than 4 times per year    Relationship status: Divorced  Other Topics Concern  . Not on file  Social History Narrative   Lives with grandson and and pets;    Have 6 sons and one dtr   Have grand- children; running children through out the day and cooking   Allergies  Allergen Reactions  . Januvia [Sitagliptin Phosphate]     dizziness  . Metformin  And Related Nausea Only  . Doxycycline Rash   Family History  Problem Relation Age of Onset  . Hypertension Mother   . Hypertension Father   . Heart disease Sister        sudden death early 70's  . Stroke Sister   . Prostate cancer Brother        cancer  . Diabetes Other   . Joint hypermobility Other        DJD    Current Outpatient Medications (Endocrine & Metabolic):  .  pioglitazone (ACTOS) 15 MG tablet, TAKE 1 TABLET BY MOUTH EVERY DAY  Current Outpatient Medications (Cardiovascular):  .  amLODipine (NORVASC) 10 MG tablet, TAKE 1 TABLET BY MOUTH EVERY DAY .  furosemide (LASIX) 20 MG tablet, TAKE 1 TABLET BY MOUTH EVERY DAY .  olmesartan-hydrochlorothiazide (BENICAR HCT) 20-12.5 MG tablet, TAKE 1 TABLET BY MOUTH EVERY DAY .  rosuvastatin (CRESTOR) 20 MG tablet,  Take 1 tablet (20 mg total) by mouth daily.  Current Outpatient Medications (Respiratory):  .  albuterol (PROAIR HFA) 108 (90 Base) MCG/ACT inhaler, INHALE 2 PUFFS INTO THE LUNGS EVERY 6 (SIX) HOURS AS NEEDED FOR WHEEZING. Marland Kitchen  ipratropium-albuterol (DUONEB) 0.5-2.5 (3) MG/3ML SOLN, USE 1 VIAL VIA NEBULIZER EVERY 6 HOURS AS NEEDED  Current Outpatient Medications (Analgesics):  .  aspirin 81 MG EC tablet, TAKE 1 TABLET BY MOUTH DAILY. SWALLOW WHOLE. Marland Kitchen  ibuprofen (ADVIL,MOTRIN) 400 MG tablet, TAKE 1 TABLET (400 MG TOTAL) BY MOUTH EVERY 6 (SIX) HOURS AS NEEDED FOR PAIN.   Current Outpatient Medications (Other):  Marland Kitchen  Cholecalciferol (VITAMIN D) 400 UNITS tablet, Take 400 Units by mouth daily.   .  citalopram (CELEXA) 10 MG tablet, TAKE 1 TABLET (10 MG TOTAL) BY MOUTH DAILY. Marland Kitchen  Coenzyme Q10 (COQ10) 100 MG capsule, Take 100 mg by mouth daily.   Marland Kitchen  glucose blood (ONETOUCH VERIO) test strip, USE 2 (TWO) TIMES DAILY. DX E11.9 *ONE TOUCH PER DR* .  Misc Natural Products (OSTEO BI-FLEX TRIPLE STRENGTH PO), Take by mouth. .  Multiple Vitamins-Minerals (WOMENS MULTIVITAMIN PO), Take 1 tablet by mouth daily. Glory Rosebush DELICA LANCETS 62V MISC, 1 each by Does not apply route 2 (two) times daily. Use to help check blood sugars twice a day Dx E11.9 .  potassium chloride (K-DUR) 10 MEQ tablet, TAKE 1 TABLET BY MOUTH EVERY DAY    Past medical history, social, surgical and family history all reviewed in electronic medical record.  No pertanent information unless stated regarding to the chief complaint.   Review of Systems:  No headache, visual changes, nausea, vomiting, diarrhea, constipation, dizziness, abdominal pain, skin rash, fevers, chills, night sweats, weight loss, swollen lymph nodes, body aches, joint swelling, , chest pain, shortness of breath, mood changes.   Positive muscle ache Objective  Blood pressure (!) 142/80, pulse 90, height 4\' 11"  (1.499 m), weight 197 lb (89.4 kg), SpO2 93 %.     General: No apparent distress alert and oriented x3 mood and affect normal, dressed appropriately.  HEENT: Pupils equal, extraocular movements intact  Respiratory: Patient's speak in full sentences and does not appear short of breath  Cardiovascular: No lower extremity edema, non tender, no erythema  Skin: Warm dry intact with no signs of infection or rash on extremities or on axial skeleton.  Abdomen: Soft nontender  Neuro: Cranial nerves II through XII are intact, neurovascularly intact in all extremities with 2+ DTRs and 2+ pulses.  Lymph: No lymphadenopathy of posterior or anterior cervical  chain or axillae bilaterally.  Gait mild antalgic MSK:  tender with limited range of motion and good stability and symmetric strength and tone of shoulders, elbows, wrist, hip, knee bilaterally.  Patient's foot exam shows the patient does have some pes planus with overpronation of the hindfoot.  Splaying between the first and second toes.  Mild pain over the medial calcaneal area on the right side of the foot.  Otherwise fairly unremarkable.  Tightness of the posterior capsule bilaterally of the ankles    Impression and Recommendations:     . The above documentation has been reviewed and is accurate and complete Lyndal Pulley, DO       Note: This dictation was prepared with Dragon dictation along with smaller phrase technology. Any transcriptional errors that result from this process are unintentional.

## 2018-08-11 NOTE — Assessment & Plan Note (Signed)
Patient is making improvement with some mild plantar fasciitis.  I do believe underlying arthritis is also contributing to some of the discomfort and pain.  As long as patient does well she can continue with conservative therapy and follow-up as needed

## 2018-08-13 ENCOUNTER — Other Ambulatory Visit: Payer: Self-pay | Admitting: Internal Medicine

## 2018-08-13 DIAGNOSIS — R928 Other abnormal and inconclusive findings on diagnostic imaging of breast: Secondary | ICD-10-CM

## 2018-08-20 ENCOUNTER — Other Ambulatory Visit: Payer: Self-pay

## 2018-08-20 ENCOUNTER — Ambulatory Visit
Admission: RE | Admit: 2018-08-20 | Discharge: 2018-08-20 | Disposition: A | Discharge status: Home / Self Care | Payer: Medicare Other | Source: Ambulatory Visit | Attending: Internal Medicine | Admitting: Internal Medicine

## 2018-08-20 DIAGNOSIS — R928 Other abnormal and inconclusive findings on diagnostic imaging of breast: Secondary | ICD-10-CM

## 2018-08-20 DIAGNOSIS — N6001 Solitary cyst of right breast: Secondary | ICD-10-CM | POA: Diagnosis not present

## 2018-08-24 ENCOUNTER — Other Ambulatory Visit: Payer: Self-pay | Admitting: Family Medicine

## 2018-09-18 ENCOUNTER — Other Ambulatory Visit: Payer: Self-pay | Admitting: Family Medicine

## 2018-09-22 NOTE — Telephone Encounter (Signed)
Do you want this refilled again?

## 2018-10-16 ENCOUNTER — Other Ambulatory Visit: Payer: Self-pay | Admitting: Family Medicine

## 2018-10-21 ENCOUNTER — Other Ambulatory Visit: Payer: Self-pay

## 2018-10-21 NOTE — Patient Outreach (Signed)
[facility]) Care Management  10/21/2018  Brandi Shaffer 10/05/1945 [id number]   Medication Adherence call to Brandi Shaffer Compliant Voice message left with a call back number. Brandi Shaffer is showing past due on Rosuvastatin 20 mg under [facility].   [facility] Management Direct Dial [phone]  Fax [phone] [email or web address]

## 2018-10-28 ENCOUNTER — Other Ambulatory Visit: Payer: Self-pay

## 2018-10-28 NOTE — Patient Outreach (Signed)
[facility]) Care Management  10/28/2018  Brandi Shaffer 30-Apr-1945 [id number]   Medication Adherence call to Brandi Shaffer Compliant Voice message left with a call back number. Brandi Shaffer is showing past due on Rosuvastatin 20 mg under [facility].   [facility] Management Direct Dial [phone]  Fax [phone] [email or web address]

## 2018-11-05 ENCOUNTER — Other Ambulatory Visit: Payer: Self-pay | Admitting: Internal Medicine

## 2018-11-09 ENCOUNTER — Other Ambulatory Visit: Payer: Self-pay | Admitting: Family Medicine

## 2018-11-11 ENCOUNTER — Other Ambulatory Visit: Payer: Self-pay | Admitting: Internal Medicine

## 2018-12-05 ENCOUNTER — Other Ambulatory Visit: Payer: Self-pay

## 2018-12-05 ENCOUNTER — Other Ambulatory Visit: Payer: Self-pay | Admitting: Family Medicine

## 2018-12-05 MED ORDER — FUROSEMIDE 20 MG PO TABS: 20.0000 mg | ORAL_TABLET | Freq: Every day | ORAL | 2 refills | Status: DC

## 2019-01-05 NOTE — Progress Notes (Addendum)
Subjective:   Brandi Shaffer is a 73 y.o. female who presents for Medicare Annual (Subsequent) preventive examination.  Review of Systems:     Sleep patterns: feels rested on waking, gets up 2 times nightly to void and sleeps 8-9 hours nightly.    Home Safety/Smoke Alarms: Feels safe in home. Smoke alarms in place.  Living environment; residence and Firearm Safety: 1-story house/ trailer. Lives with grandson, no needs for DME, good support system Seat Belt Safety/Bike Helmet: Wears seat belt.    Objective:     Vitals: BP (!) 144/88    Pulse 80    Temp 98.7 F (37.1 C)    Resp 17    Ht 4\' 11"  (1.499 m)    Wt 200 lb (90.7 kg)    SpO2 99%    BMI 40.40 kg/m   Body mass index is 40.4 kg/m.  Advanced Directives 01/06/2019 11/15/2017 11/14/2016 07/31/2015 06/30/2015 06/29/2014  Does Patient Have a Medical Advance Directive? No No No No No No  Does patient want to make changes to medical advance directive? - - Yes (ED - Information included in AVS) - - -  Would patient like information on creating a medical advance directive? No - Patient declined Yes (ED - Information included in AVS) - No - patient declined information - No - patient declined information    Tobacco Social History   Tobacco Use  Smoking Status Never Smoker  Smokeless Tobacco Never Used     Counseling given: Not Answered  Past Medical History:  Diagnosis Date   ANEMIA-IRON DEFICIENCY 12/06/2009   Anxiety state 07/01/2015   Asthma 12/05/2011   DIABETES MELLITUS, TYPE II 09/17/2006   DIVERTICULOSIS, COLON 12/06/2009   GENITAL HERPES 03/24/2010   GERD (gastroesophageal reflux disease) 06/28/2011   HYPERLIPIDEMIA 03/24/2010   HYPERTENSION 09/17/2006   HYPOTHYROIDISM 09/17/2006   LIPOMA 12/06/2009   PERIPHERAL EDEMA 03/24/2010   Renal cyst 07/05/2011   1.9 cm minimally complex   Past Surgical History:  Procedure Laterality Date   ABDOMINAL HYSTERECTOMY     fibroids   ankle surgury     x 3 left - chronic  pain/swelling   SHOULDER SURGERY Left    TONSILLECTOMY     TUBAL LIGATION     Family History  Problem Relation Age of Onset   Hypertension Mother    Hypertension Father    Heart disease Sister        sudden death early 14's   Stroke Sister    Prostate cancer Brother        cancer   Diabetes Other    Joint hypermobility Other        DJD   Social History   Socioeconomic History   Marital status: Divorced    Spouse name: Not on file   Number of children: 7   Years of education: Not on file   Highest education level: Not on file  Occupational History   Occupation: CASHIER/ retired    Comment: gas station, stands for work  Scientist, product/process development strain: Not hard at [employer] insecurity    Worry: Never true    Inability: Never true   Transportation needs    Medical: No    Non-medical: No  Tobacco Use   Smoking status: Never Smoker   Smokeless tobacco: Never Used  Substance and Sexual Activity   Alcohol use: No    Alcohol/week: 0.0 standard drinks   Drug use: No  Sexual activity: Not Currently  Lifestyle   Physical activity    Days per week: 0 days    Minutes per session: 0 min   Stress: Not at all  Relationships   Social connections    Talks on phone: More than three times a week    Gets together: More than three times a week    Attends religious service: Never    Active member of club or organization: Yes    Attends meetings of clubs or organizations: More than 4 times per year    Relationship status: Divorced  Other Topics Concern   Not on file  Social History Narrative   Lives with grandson and and pets;    Have 6 sons and one dtr   Have grand- children; running children through out the day and cooking    Outpatient Encounter Medications as of 01/06/2019  Medication Sig   albuterol (PROAIR HFA) 108 (90 Base) MCG/ACT inhaler INHALE 2 PUFFS INTO THE LUNGS EVERY 6 (SIX) HOURS AS NEEDED FOR WHEEZING.   amLODipine  (NORVASC) 10 MG tablet TAKE 1 TABLET BY MOUTH EVERY DAY   aspirin 81 MG EC tablet TAKE 1 TABLET BY MOUTH DAILY. SWALLOW WHOLE.   Cholecalciferol (VITAMIN D) 400 UNITS tablet Take 400 Units by mouth daily.     Coenzyme Q10 (COQ10) 100 MG capsule Take 100 mg by mouth daily.     furosemide (LASIX) 20 MG tablet Take 1 tablet (20 mg total) by mouth daily.   glucose blood (ONETOUCH VERIO) test strip USE 2 (TWO) TIMES DAILY. DX E11.9 *ONE TOUCH PER DR*   ibuprofen (ADVIL,MOTRIN) 400 MG tablet TAKE 1 TABLET (400 MG TOTAL) BY MOUTH EVERY 6 (SIX) HOURS AS NEEDED FOR PAIN.   ipratropium-albuterol (DUONEB) 0.5-2.5 (3) MG/3ML SOLN USE 1 VIAL VIA NEBULIZER EVERY 6 HOURS AS NEEDED   Misc Natural Products (OSTEO BI-FLEX TRIPLE STRENGTH PO) Take by mouth.   Multiple Vitamins-Minerals (WOMENS MULTIVITAMIN PO) Take 1 tablet by mouth daily.   olmesartan-hydrochlorothiazide (BENICAR HCT) 20-12.5 MG tablet TAKE 1 TABLET BY MOUTH EVERY DAY   ONETOUCH DELICA LANCETS 99991111 MISC 1 each by Does not apply route 2 (two) times daily. Use to help check blood sugars twice a day Dx E11.9   pioglitazone (ACTOS) 15 MG tablet TAKE 1 TABLET BY MOUTH EVERY DAY   potassium chloride (K-DUR) 10 MEQ tablet TAKE 1 TABLET BY MOUTH EVERY DAY   [DISCONTINUED] citalopram (CELEXA) 10 MG tablet TAKE 1 TABLET (10 MG TOTAL) BY MOUTH DAILY. (Patient not taking: Reported on 01/06/2019)   [DISCONTINUED] rosuvastatin (CRESTOR) 20 MG tablet Take 1 tablet (20 mg total) by mouth daily.   No facility-administered encounter medications on file as of 01/06/2019.     Activities of Daily Living No flowsheet data found.  Patient Care Team: Biagio Borg, MD as PCP - General    Assessment:   This is a routine wellness examination for [place]. Physical assessment deferred to PCP.  Exercise Activities and Dietary recommendations   Diet (meal preparation, eat out, water intake, caffeinated beverages, dairy products, fruits and vegetables):  in general, a "healthy" diet     Reviewed heart healthy and diabetic diet. Encouraged patient to increase daily water and healthy fluid intake.  Goals     I am learning to take tough love measures with my daughter.      Spend time doing things for me, enjoy life, family and be carefree     Patient Stated  Fall Risk Fall Risk  01/06/2019 07/03/2018 11/15/2017 06/25/2017 11/14/2016  Falls in the past year? 1 0 No No No  Number falls in past yr: 1 - - - -  Injury with Fall? 0 - - - -  Comment - - - - -  Risk for fall due to : Impaired balance/gait;Impaired mobility - - - -   Is the patient's home free of loose throw rugs in walkways, pet beds, electrical cords, etc?   yes      Grab bars in the bathroom? no, reports she does not need any       Handrails on the stairs?   yes      Adequate lighting?   yes  Depression Screen PHQ 2/9 Scores 01/06/2019 07/03/2018 11/15/2017 06/25/2017  PHQ - 2 Score 1 0 0 0  PHQ- 9 Score 1 - - -     Cognitive Function MMSE - Mini Mental State Exam 11/14/2016 06/29/2014  Not completed: - Unable to complete  Orientation to time 5 -  Orientation to Place 5 -  Registration 3 -  Attention/ Calculation 4 -  Recall 2 -  Language- name 2 objects 2 -  Language- repeat 1 -  Language- follow 3 step command 3 -  Language- read & follow direction 1 -  Write a sentence 1 -  Copy design 1 -  Total score 28 -       Ad8 score reviewed for issues:  Issues making decisions: no  Less interest in hobbies / activities: no  Repeats questions, stories (family complaining): no  Trouble using ordinary gadgets (microwave, computer, phone):no  Forgets the month or year: no  Mismanaging finances: no  Remembering appts: no  Daily problems with thinking and/or memory: no Ad8 score is= 0  Immunization History  Administered Date(s) Administered   Fluad Quad(high Dose 65+) 01/06/2019   Influenza Split 11/21/2010, 12/05/2011   Influenza Whole 12/06/2009    Influenza, High Dose Seasonal PF 12/04/2012, 12/04/2013, 11/14/2016, 11/15/2017   Influenza,inj,Quad PF,6+ Mos 12/15/2014   Pneumococcal Conjugate-13 12/19/2012   Pneumococcal Polysaccharide-23 11/27/2004, 12/06/2009, 06/25/2017   Td 11/27/2004, 06/08/2014   Screening Tests Health Maintenance  Topic Date Due   INFLUENZA VACCINE  09/27/2018   HEMOGLOBIN A1C  01/03/2019   OPHTHALMOLOGY EXAM  01/06/2019 (Originally 12/05/2018)   FOOT EXAM  01/07/2019 (Originally 06/26/2018)   COLONOSCOPY  01/17/2020   MAMMOGRAM  08/10/2020   TETANUS/TDAP  06/07/2024   DEXA SCAN  Completed   Hepatitis C Screening  Completed   PNA vac Low Risk Adult  Completed      Plan:    Reviewed health maintenance screenings with patient today and relevant education, vaccines, and/or referrals were provided.   I have personally reviewed and noted the following in the patients chart:    Medical and social history  Use of alcohol, tobacco or illicit drugs   Current medications and supplements  Functional ability and status  Nutritional status  Physical activity  Advanced directives  List of other physicians  Vitals  Screenings to include cognitive, depression, and falls  Referrals and appointments  In addition, I have reviewed and discussed with patient certain preventive protocols, quality metrics, and best practice recommendations. A written personalized care plan for preventive services as well as general preventive health recommendations were provided to patient.     Michiel Cowboy, RN  01/06/2019   Medical screening examination/treatment/procedure(s) were performed by non-physician practitioner and as supervising physician I was immediately available for  consultation/collaboration. I agree with above. Cathlean Cower, MD

## 2019-01-06 ENCOUNTER — Encounter: Payer: Self-pay | Admitting: Internal Medicine

## 2019-01-06 ENCOUNTER — Ambulatory Visit (INDEPENDENT_AMBULATORY_CARE_PROVIDER_SITE_OTHER): Payer: Medicare Other | Admitting: *Deleted

## 2019-01-06 ENCOUNTER — Ambulatory Visit (INDEPENDENT_AMBULATORY_CARE_PROVIDER_SITE_OTHER): Payer: Medicare Other | Admitting: Internal Medicine

## 2019-01-06 ENCOUNTER — Other Ambulatory Visit (INDEPENDENT_AMBULATORY_CARE_PROVIDER_SITE_OTHER): Payer: Medicare Other

## 2019-01-06 ENCOUNTER — Other Ambulatory Visit: Payer: Self-pay

## 2019-01-06 VITALS — BP 144/88 | HR 80 | Temp 98.7°F | Resp 17 | Ht 59.0 in | Wt 200.0 lb

## 2019-01-06 VITALS — BP 144/88 | HR 80 | Temp 98.7°F | Ht 59.0 in | Wt 200.0 lb

## 2019-01-06 DIAGNOSIS — Z23 Encounter for immunization: Secondary | ICD-10-CM

## 2019-01-06 DIAGNOSIS — E119 Type 2 diabetes mellitus without complications: Secondary | ICD-10-CM

## 2019-01-06 DIAGNOSIS — M79644 Pain in right finger(s): Secondary | ICD-10-CM | POA: Diagnosis not present

## 2019-01-06 DIAGNOSIS — E611 Iron deficiency: Secondary | ICD-10-CM | POA: Diagnosis not present

## 2019-01-06 DIAGNOSIS — Z0000 Encounter for general adult medical examination without abnormal findings: Secondary | ICD-10-CM

## 2019-01-06 DIAGNOSIS — Z0001 Encounter for general adult medical examination with abnormal findings: Secondary | ICD-10-CM

## 2019-01-06 DIAGNOSIS — I10 Essential (primary) hypertension: Secondary | ICD-10-CM

## 2019-01-06 DIAGNOSIS — E559 Vitamin D deficiency, unspecified: Secondary | ICD-10-CM

## 2019-01-06 DIAGNOSIS — E785 Hyperlipidemia, unspecified: Secondary | ICD-10-CM

## 2019-01-06 DIAGNOSIS — E538 Deficiency of other specified B group vitamins: Secondary | ICD-10-CM

## 2019-01-06 HISTORY — DX: Pain in right finger(s): M79.644

## 2019-01-06 LAB — BASIC METABOLIC PANEL
BUN: 15 mg/dL (ref 6–23)
CO2: 27 mEq/L (ref 19–32)
Calcium: 9.4 mg/dL (ref 8.4–10.5)
Chloride: 105 mEq/L (ref 96–112)
Creatinine, Ser: 0.9 mg/dL (ref 0.40–1.20)
GFR: 74.05 mL/min (ref >60.00)
Glucose, Bld: 86 mg/dL (ref 70–99)
Potassium: 3.6 mEq/L (ref 3.5–5.1)
Sodium: 140 mEq/L (ref 135–145)

## 2019-01-06 LAB — HEPATIC FUNCTION PANEL
ALT: 11 U/L (ref 0–35)
AST: 16 U/L (ref 0–37)
Albumin: 4.4 g/dL (ref 3.5–5.2)
Alkaline Phosphatase: 111 U/L (ref 39–117)
Bilirubin, Direct: 0.1 mg/dL (ref 0.0–0.3)
Total Bilirubin: 0.5 mg/dL (ref 0.2–1.2)
Total Protein: 7.3 g/dL (ref 6.0–8.3)

## 2019-01-06 LAB — LIPID PANEL
Cholesterol: 205 mg/dL — ABNORMAL HIGH (ref 0–200)
HDL: 59.5 mg/dL (ref >39.00)
LDL Cholesterol: 129 mg/dL — ABNORMAL HIGH (ref 0–99)
NonHDL: 145.85
Total CHOL/HDL Ratio: 3
Triglycerides: 83 mg/dL (ref 0.0–149.0)
VLDL: 16.6 mg/dL (ref 0.0–40.0)

## 2019-01-06 LAB — VITAMIN B12: Vitamin B-12: 760 pg/mL (ref 211–911)

## 2019-01-06 LAB — VITAMIN D 25 HYDROXY (VIT D DEFICIENCY, FRACTURES): VITD: 18.94 ng/mL — ABNORMAL LOW (ref 30.00–100.00)

## 2019-01-06 LAB — IBC PANEL
Iron: 94 ug/dL (ref 42–145)
Saturation Ratios: 29.4 % (ref 20.0–50.0)
Transferrin: 228 mg/dL (ref 212.0–360.0)

## 2019-01-06 LAB — HEMOGLOBIN A1C: Hgb A1c MFr Bld: 5.9 % (ref 4.6–6.5)

## 2019-01-06 NOTE — Assessment & Plan Note (Signed)

## 2019-01-06 NOTE — Assessment & Plan Note (Addendum)
C/w trigger fingers/OA, declines hand surgury referral for now, for volt gel prn OTC  In addition to the time spent performing CPE, I spent an additional 25 minutes face to face,in which greater than 50% of this time was spent in counseling and coordination of care for patient's acute illness as documented, including the differential dx, treatment, further evaluation and other management of right thumb pain, DM, HTN, HLD

## 2019-01-06 NOTE — Assessment & Plan Note (Signed)
Overall stable overall by history and exam, recent data reviewed with pt, and pt to continue medical treatment as before,  to f/u any worsening symptoms or concerns  

## 2019-01-06 NOTE — Assessment & Plan Note (Signed)
Pt states will try statin if needed if LDL > 70

## 2019-01-06 NOTE — Progress Notes (Signed)
Subjective:    Patient ID: Brandi Shaffer, female    DOB: July 29, 1945, 73 y.o.   MRN: [id number]  HPI   Here for wellness and f/u;  Overall doing ok;  Pt denies Chest pain, worsening SOB, DOE, wheezing, orthopnea, PND, worsening LE edema, palpitations, dizziness or syncope.  Pt denies neurological change such as new headache, facial or extremity weakness.  Pt denies polydipsia, polyuria, or low sugar symptoms. Pt states overall good compliance with treatment and medications, good tolerability, and has been trying to follow appropriate diet.  Pt denies worsening depressive symptoms, suicidal ideation or panic. No fever, night sweats, wt loss, loss of appetite, or other constitutional symptoms.  Pt states good ability with ADL's, has low fall risk, home safety reviewed and adequate, no other significant changes in hearing or vision, and only occasionally active with exercise. Due for flu shot, pt will make optho appt, and podiatry nov 11 for foot exam.  Never did start the statin from last visit, has been trying to work on diet.  No new complaints except2-3 mo gradually wrosening right thumb and index finger trigger fingers, sharp, mod, worse to use the fingers and the joints seem to "catch" and limits her activity.  BP at home has been < 140/90 Past Medical History:  Diagnosis Date  . ANEMIA-IRON DEFICIENCY 12/06/2009  . Anxiety state 07/01/2015  . Asthma 12/05/2011  . DIABETES MELLITUS, TYPE II 09/17/2006  . DIVERTICULOSIS, COLON 12/06/2009  . GENITAL HERPES 03/24/2010  . GERD (gastroesophageal reflux disease) 06/28/2011  . HYPERLIPIDEMIA 03/24/2010  . HYPERTENSION 09/17/2006  . HYPOTHYROIDISM 09/17/2006  . LIPOMA 12/06/2009  . PERIPHERAL EDEMA 03/24/2010  . Renal cyst 07/05/2011   1.9 cm minimally complex   Past Surgical History:  Procedure Laterality Date  . ABDOMINAL HYSTERECTOMY     fibroids  . ankle surgury     x 3 left - chronic pain/swelling  . SHOULDER SURGERY Left   . TONSILLECTOMY    .  TUBAL LIGATION      reports that she has never smoked. She has never used smokeless tobacco. She reports that she does not drink alcohol or use drugs. family history includes Diabetes in an other family member; Heart disease in her sister; Hypertension in her father and mother; Joint hypermobility in an other family member; Prostate cancer in her brother; Stroke in her sister. Allergies  Allergen Reactions  . Januvia [Sitagliptin Phosphate]     dizziness  . Metformin And Related Nausea Only  . Doxycycline Rash    Review of Systems Constitutional: Negative for other unusual diaphoresis, sweats, appetite or weight changes HENT: Negative for other worsening hearing loss, ear pain, facial swelling, mouth sores or neck stiffness.   Eyes: Negative for other worsening pain, redness or other visual disturbance.  Respiratory: Negative for other stridor or swelling Cardiovascular: Negative for other palpitations or other chest pain  Gastrointestinal: Negative for worsening diarrhea or loose stools, blood in stool, distention or other pain Genitourinary: Negative for hematuria, flank pain or other change in urine volume.  Musculoskeletal: Negative for myalgias or other joint swelling.  Skin: Negative for other color change, or other wound or worsening drainage.  Neurological: Negative for other syncope or numbness. Hematological: Negative for other adenopathy or swelling Psychiatric/Behavioral: Negative for hallucinations, other worsening agitation, SI, self-injury, or new decreased concentration All otherwise neg per pt    Objective:   Physical Exam BP (!) 144/88   Pulse 80   Temp 98.7 F (37.1 C) (  Oral)   Ht 4\' 11"  (1.499 m)   Wt 200 lb (90.7 kg)   SpO2 99%   BMI 40.40 kg/m  VS noted,  Constitutional: Pt is oriented to person, place, and time. Appears well-developed and well-nourished, in no significant distress and comfortable Head: Normocephalic and atraumatic  Eyes: Conjunctivae  and EOM are normal. Pupils are equal, round, and reactive to light Right Ear: External ear normal without discharge Left Ear: External ear normal without discharge Nose: Nose without discharge or deformity Mouth/Throat: Oropharynx is without other ulcerations and moist  Neck: Normal range of motion. Neck supple. No JVD present. No tracheal deviation present or significant neck LA or mass Cardiovascular: Normal rate, regular rhythm, normal heart sounds and intact distal pulses.   Pulmonary/Chest: WOB normal and breath sounds without rales or wheezing  Abdominal: Soft. Bowel sounds are normal. NT. No HSM  Musculoskeletal: Normal range of motion. Exhibits no edema Lymphadenopathy: Has no other cervical adenopathy.  Neurological: Pt is alert and oriented to person, place, and time. Pt has normal reflexes. No cranial nerve deficit. Motor grossly intact, Gait intact Skin: Skin is warm and dry. No rash noted or new ulcerations Psychiatric:  Has normal mood and affect. Behavior is normal without agitation All otherwise neg per pt Lab Results  Component Value Date   WBC 5.6 07/03/2018   HGB 12.8 07/03/2018   HCT 38.2 07/03/2018   PLT 285.0 07/03/2018   GLUCOSE 83 07/03/2018   CHOL 175 07/03/2018   TRIG 94.0 07/03/2018   HDL 52.00 07/03/2018   LDLDIRECT 134.6 05/23/2010   LDLCALC 104 (H) 07/03/2018   ALT 9 07/03/2018   AST 12 07/03/2018   NA 141 07/03/2018   K 3.8 07/03/2018   CL 105 07/03/2018   CREATININE 0.90 07/03/2018   BUN 22 07/03/2018   CO2 28 07/03/2018   TSH 2.21 07/03/2018   HGBA1C 5.9 07/03/2018   MICROALBUR 1.1 07/03/2018      Assessment & Plan:

## 2019-01-06 NOTE — Patient Instructions (Signed)
Continue doing brain stimulating activities (puzzles, reading, adult coloring books, staying active) to keep memory sharp.   Continue to eat heart healthy diet (full of fruits, vegetables, whole grains, lean protein, water--limit salt, fat, and sugar intake) and increase physical activity as tolerated.   Brandi Shaffer , Thank you for taking time to come for your Medicare Wellness Visit. I appreciate your ongoing commitment to your health goals. Please review the following plan we discussed and let me know if I can assist you in the future.   These are the goals we discussed: Goals    . I am learning to take tough love measures with my daughter.      Spend time doing things for me, enjoy life, family and be carefree    . Patient Stated       This is a list of the screening recommended for you and due dates:  Health Maintenance  Topic Date Due  . Complete foot exam   06/26/2018  . Flu Shot  09/27/2018  . Eye exam for diabetics  12/05/2018  . Hemoglobin A1C  01/03/2019  . Colon Cancer Screening  01/17/2020  . Mammogram  08/10/2020  . Tetanus Vaccine  06/07/2024  . DEXA scan (bone density measurement)  Completed  .  Hepatitis C: One time screening is recommended by Center for Disease Control  (CDC) for  adults born from [birth year] through 1966.   Completed  . Pneumonia vaccines  Completed    Preventive Care 72 Years and Older, Female Preventive care refers to lifestyle choices and visits with your health care provider that can promote health and wellness. This includes:  A yearly physical exam. This is also called an annual well check.  Regular dental and eye exams.  Immunizations.  Screening for certain conditions.  Healthy lifestyle choices, such as diet and exercise. What can I expect for my preventive care visit? Physical exam Your health care provider will check:  Height and weight. These may be used to calculate body mass index (BMI), which is a measurement that tells if you  are at a healthy weight.  Heart rate and blood pressure.  Your skin for abnormal spots. Counseling Your health care provider may ask you questions about:  Alcohol, tobacco, and drug use.  Emotional well-being.  Home and relationship well-being.  Sexual activity.  Eating habits.  History of falls.  Memory and ability to understand (cognition).  Work and work Statistician.  Pregnancy and menstrual history. What immunizations do I need?  Influenza (flu) vaccine  This is recommended every year. Tetanus, diphtheria, and pertussis (Tdap) vaccine  You may need a Td booster every 10 years. Varicella (chickenpox) vaccine  You may need this vaccine if you have not already been vaccinated. Zoster (shingles) vaccine  You may need this after age 63. Pneumococcal conjugate (PCV13) vaccine  One dose is recommended after age 81. Pneumococcal polysaccharide (PPSV23) vaccine  One dose is recommended after age 40. Measles, mumps, and rubella (MMR) vaccine  You may need at least one dose of MMR if you were born in 1957 or later. You may also need a second dose. Meningococcal conjugate (MenACWY) vaccine  You may need this if you have certain conditions. Hepatitis A vaccine  You may need this if you have certain conditions or if you travel or work in places where you may be exposed to hepatitis A. Hepatitis B vaccine  You may need this if you have certain conditions or if you travel or work  in places where you may be exposed to hepatitis B. Haemophilus influenzae type b (Hib) vaccine  You may need this if you have certain conditions. You may receive vaccines as individual doses or as more than one vaccine together in one shot (combination vaccines). Talk with your health care provider about the risks and benefits of combination vaccines. What tests do I need? Blood tests  Lipid and cholesterol levels. These may be checked every 5 years, or more frequently depending on your  overall health.  Hepatitis C test.  Hepatitis B test. Screening  Lung cancer screening. You may have this screening every year starting at age 55 if you have a 30-pack-year history of smoking and currently smoke or have quit within the past 15 years.  Colorectal cancer screening. All adults should have this screening starting at age 19 and continuing until age 34. Your health care provider may recommend screening at age 48 if you are at increased risk. You will have tests every 1-10 years, depending on your results and the type of screening test.  Diabetes screening. This is done by checking your blood sugar (glucose) after you have not eaten for a while (fasting). You may have this done every 1-3 years.  Mammogram. This may be done every 1-2 years. Talk with your health care provider about how often you should have regular mammograms.  BRCA-related cancer screening. This may be done if you have a family history of breast, ovarian, tubal, or peritoneal cancers. Other tests  Sexually transmitted disease (STD) testing.  Bone density scan. This is done to screen for osteoporosis. You may have this done starting at age 33. Follow these instructions at home: Eating and drinking  Eat a diet that includes fresh fruits and vegetables, whole grains, lean protein, and low-fat dairy products. Limit your intake of foods with high amounts of sugar, saturated fats, and salt.  Take vitamin and mineral supplements as recommended by your health care provider.  Do not drink alcohol if your health care provider tells you not to drink.  If you drink alcohol: ? Limit how much you have to 0-1 drink a day. ? Be aware of how much alcohol is in your drink. In the [place] one drink equals one 12 oz bottle of beer (355 mL), one 5 oz glass of  (148 mL), or one 1 oz glass of hard liquor (44 mL). Lifestyle  Take daily care of your teeth and gums.  Stay active. Exercise for at least 30 minutes on 5 or more  days each week.  Do not use any products that contain nicotine or tobacco, such as cigarettes, e-cigarettes, and chewing tobacco. If you need help quitting, ask your health care provider.  If you are sexually active, practice safe sex. Use a condom or other form of protection in order to prevent STIs (sexually transmitted infections).  Talk with your health care provider about taking a low-dose aspirin or statin. What's next?  Go to your health care provider once a year for a well check visit.  Ask your health care provider how often you should have your eyes and teeth checked.  Stay up to date on all vaccines. This information is not intended to replace advice given to you by your health care provider. Make sure you discuss any questions you have with your health care provider. Document Released: 03/11/2015 Document Revised: 02/06/2018 Document Reviewed: 02/06/2018 Elsevier Patient Education  2020 [employer].

## 2019-01-06 NOTE — Assessment & Plan Note (Signed)
stable overall by history and exam, recent data reviewed with pt, and pt to continue medical treatment as before,  to f/u any worsening symptoms or concerns  

## 2019-01-06 NOTE — Patient Instructions (Signed)
OK to try the OTC Voltaren gel for thumb and finger joint pain (and knees too)  Please be sure to make your eye doctor and keep your foot doctor appointments  Please continue all other medications as before, and refills have been done if requested.  Please have the pharmacy call with any other refills you may need.  Please continue your efforts at being more active, low cholesterol diet, and weight control.  You are otherwise up to date with prevention measures today.  Please keep your appointments with your specialists as you may have planned  Please go to the LAB in the Basement (turn left off the elevator) for the tests to be done today  You will be contacted by phone if any changes need to be made immediately.  Otherwise, you will receive a letter about your results with an explanation, but please check with MyChart first.  Please remember to sign up for MyChart if you have not done so, as this will be important to you in the future with finding out test results, communicating by private email, and scheduling acute appointments online when needed.  Please return in 6 months, or sooner if needed, with Lab testing done 3-5 days before

## 2019-01-07 ENCOUNTER — Ambulatory Visit (INDEPENDENT_AMBULATORY_CARE_PROVIDER_SITE_OTHER): Payer: Medicare Other | Admitting: Podiatry

## 2019-01-07 ENCOUNTER — Ambulatory Visit (INDEPENDENT_AMBULATORY_CARE_PROVIDER_SITE_OTHER): Payer: Medicare Other

## 2019-01-07 ENCOUNTER — Encounter: Payer: Self-pay | Admitting: Podiatry

## 2019-01-07 ENCOUNTER — Encounter

## 2019-01-07 DIAGNOSIS — M79675 Pain in left toe(s): Secondary | ICD-10-CM | POA: Diagnosis not present

## 2019-01-07 DIAGNOSIS — B351 Tinea unguium: Secondary | ICD-10-CM

## 2019-01-07 DIAGNOSIS — M79674 Pain in right toe(s): Secondary | ICD-10-CM

## 2019-01-07 DIAGNOSIS — M722 Plantar fascial fibromatosis: Secondary | ICD-10-CM

## 2019-01-07 NOTE — Patient Instructions (Addendum)
New Balance or Sketcher's sneakers  Plantar Fasciitis (Heel Spur Syndrome) with Rehab The plantar fascia is a fibrous, ligament-like, soft-tissue structure that spans the bottom of the foot. Plantar fasciitis is a condition that causes pain in the foot due to inflammation of the tissue. SYMPTOMS   Pain and tenderness on the underneath side of the foot.  Pain that worsens with standing or walking. CAUSES  Plantar fasciitis is caused by irritation and injury to the plantar fascia on the underneath side of the foot. Common mechanisms of injury include:  Direct trauma to bottom of the foot.  Damage to a small nerve that runs under the foot where the main fascia attaches to the heel bone.  Stress placed on the plantar fascia due to bone spurs. RISK INCREASES WITH:   Activities that place stress on the plantar fascia (running, jumping, pivoting, or cutting).  Poor strength and flexibility.  Improperly fitted shoes.  Tight calf muscles.  Flat feet.  Failure to warm-up properly before activity.  Obesity. PREVENTION  Warm up and stretch properly before activity.  Allow for adequate recovery between workouts.  Maintain physical fitness:  Strength, flexibility, and endurance.  Cardiovascular fitness.  Maintain a health body weight.  Avoid stress on the plantar fascia.  Wear properly fitted shoes, including arch supports for individuals who have flat feet.  PROGNOSIS  If treated properly, then the symptoms of plantar fasciitis usually resolve without surgery. However, occasionally surgery is necessary.  RELATED COMPLICATIONS   Recurrent symptoms that may result in a chronic condition.  Problems of the lower back that are caused by compensating for the injury, such as limping.  Pain or weakness of the foot during push-off following surgery.  Chronic inflammation, scarring, and partial or complete fascia tear, occurring more often from repeated injections.  TREATMENT   Treatment initially involves the use of ice and medication to help reduce pain and inflammation. The use of strengthening and stretching exercises may help reduce pain with activity, especially stretches of the Achilles tendon. These exercises may be performed at home or with a therapist. Your caregiver may recommend that you use heel cups of arch supports to help reduce stress on the plantar fascia. Occasionally, corticosteroid injections are given to reduce inflammation. If symptoms persist for greater than 6 months despite non-surgical (conservative), then surgery may be recommended.   MEDICATION   If pain medication is necessary, then nonsteroidal anti-inflammatory medications, such as aspirin and ibuprofen, or other minor pain relievers, such as acetaminophen, are often recommended.  Do not take pain medication within 7 days before surgery.  Prescription pain relievers may be given if deemed necessary by your caregiver. Use only as directed and only as much as you need.  Corticosteroid injections may be given by your caregiver. These injections should be reserved for the most serious cases, because they may only be given a certain number of times.  HEAT AND COLD  Cold treatment (icing) relieves pain and reduces inflammation. Cold treatment should be applied for 10 to 15 minutes every 2 to 3 hours for inflammation and pain and immediately after any activity that aggravates your symptoms. Use ice packs or massage the area with a piece of ice (ice massage).  Heat treatment may be used prior to performing the stretching and strengthening activities prescribed by your caregiver, physical therapist, or athletic trainer. Use a heat pack or soak the injury in warm water.  SEEK IMMEDIATE MEDICAL CARE IF:  Treatment seems to offer no benefit, or  the condition worsens.  Any medications produce adverse side effects.  EXERCISES- RANGE OF MOTION (ROM) AND STRETCHING EXERCISES - Plantar Fasciitis  (Heel Spur Syndrome) These exercises may help you when beginning to rehabilitate your injury. Your symptoms may resolve with or without further involvement from your physician, physical therapist or athletic trainer. While completing these exercises, remember:   Restoring tissue flexibility helps normal motion to return to the joints. This allows healthier, less painful movement and activity.  An effective stretch should be held for at least 30 seconds.  A stretch should never be painful. You should only feel a gentle lengthening or release in the stretched tissue.  RANGE OF MOTION - Toe Extension, Flexion  Sit with your right / left leg crossed over your opposite knee.  Grasp your toes and gently pull them back toward the top of your foot. You should feel a stretch on the bottom of your toes and/or foot.  Hold this stretch for 10 seconds.  Now, gently pull your toes toward the bottom of your foot. You should feel a stretch on the top of your toes and or foot.  Hold this stretch for 10 seconds. Repeat  times. Complete this stretch 3 times per day.   RANGE OF MOTION - Ankle Dorsiflexion, Active Assisted  Remove shoes and sit on a chair that is preferably not on a carpeted surface.  Place right / left foot under knee. Extend your opposite leg for support.  Keeping your heel down, slide your right / left foot back toward the chair until you feel a stretch at your ankle or calf. If you do not feel a stretch, slide your bottom forward to the edge of the chair, while still keeping your heel down.  Hold this stretch for 10 seconds. Repeat 3 times. Complete this stretch 2 times per day.   STRETCH  Gastroc, Standing  Place hands on wall.  Extend right / left leg, keeping the front knee somewhat bent.  Slightly point your toes inward on your back foot.  Keeping your right / left heel on the floor and your knee straight, shift your weight toward the wall, not allowing your back to arch.   You should feel a gentle stretch in the right / left calf. Hold this position for 10 seconds. Repeat 3 times. Complete this stretch 2 times per day.  STRETCH  Soleus, Standing  Place hands on wall.  Extend right / left leg, keeping the other knee somewhat bent.  Slightly point your toes inward on your back foot.  Keep your right / left heel on the floor, bend your back knee, and slightly shift your weight over the back leg so that you feel a gentle stretch deep in your back calf.  Hold this position for 10 seconds. Repeat 3 times. Complete this stretch 2 times per day.  STRETCH  Gastrocsoleus, Standing  Note: This exercise can place a lot of stress on your foot and ankle. Please complete this exercise only if specifically instructed by your caregiver.   Place the ball of your right / left foot on a step, keeping your other foot firmly on the same step.  Hold on to the wall or a rail for balance.  Slowly lift your other foot, allowing your body weight to press your heel down over the edge of the step.  You should feel a stretch in your right / left calf.  Hold this position for 10 seconds.  Repeat this exercise with a  slight bend in your right / left knee. Repeat 3 times. Complete this stretch 2 times per day.   STRENGTHENING EXERCISES - Plantar Fasciitis (Heel Spur Syndrome)  These exercises may help you when beginning to rehabilitate your injury. They may resolve your symptoms with or without further involvement from your physician, physical therapist or athletic trainer. While completing these exercises, remember:   Muscles can gain both the endurance and the strength needed for everyday activities through controlled exercises.  Complete these exercises as instructed by your physician, physical therapist or athletic trainer. Progress the resistance and repetitions only as guided.  STRENGTH - Towel Curls  Sit in a chair positioned on a non-carpeted surface.  Place your  foot on a towel, keeping your heel on the floor.  Pull the towel toward your heel by only curling your toes. Keep your heel on the floor. Repeat 3 times. Complete this exercise 2 times per day.  STRENGTH - Ankle Inversion  Secure one end of a rubber exercise band/tubing to a fixed object (table, pole). Loop the other end around your foot just before your toes.  Place your fists between your knees. This will focus your strengthening at your ankle.  Slowly, pull your big toe up and in, making sure the band/tubing is positioned to resist the entire motion.  Hold this position for 10 seconds.  Have your muscles resist the band/tubing as it slowly pulls your foot back to the starting position. Repeat 3 times. Complete this exercises 2 times per day.  Document Released: 02/12/2005 Document Revised: 05/07/2011 Document Reviewed: 05/27/2008 [facility] Patient Information 2014 [place], [place].

## 2019-01-08 ENCOUNTER — Other Ambulatory Visit: Payer: Self-pay | Admitting: Family Medicine

## 2019-01-08 ENCOUNTER — Other Ambulatory Visit: Payer: Self-pay | Admitting: Internal Medicine

## 2019-01-08 MED ORDER — VITAMIN D (ERGOCALCIFEROL) 1.25 MG (50000 UNIT) PO CAPS: 50000.0000 [IU] | ORAL_CAPSULE | ORAL | 0 refills | Status: DC

## 2019-01-08 MED ORDER — ATORVASTATIN CALCIUM 20 MG PO TABS: 20.0000 mg | ORAL_TABLET | Freq: Every day | ORAL | 3 refills | Status: DC

## 2019-01-12 ENCOUNTER — Telehealth: Payer: Self-pay | Admitting: *Deleted

## 2019-01-12 NOTE — Telephone Encounter (Signed)
Notified pt w/MD response.../lmb 

## 2019-01-12 NOTE — Telephone Encounter (Signed)
Copied from [place] [phone]. Topic: General - Other >> Jan 12, 2019 10:27 AM Leward Quan A wrote: Reason for CRM: Patient called to say that she have some rosuvastatin (CRESTOR) 20 MG tablet that is not out dated and want to know from Dr Jenny Reichmann can she continue taking that till its all used up and then start the atorvastatin (LIPITOR) 20 MG tablet when she completely run out. She states that it is a waste of money to have one medication and then to change to something else. Please call patient at Ph# [phone] with an answer Thank you

## 2019-01-12 NOTE — Telephone Encounter (Signed)
Ok sure that would be fine to finish the crestor first

## 2019-01-14 NOTE — Progress Notes (Signed)
Subjective: Brandi Shaffer is seen today for follow up painful, elongated, thickened toenails 1-5 b/l feet that she cannot cut. Pain interferes with daily activities. Aggravating factor includes wearing enclosed shoe gear and relieved with periodic debridement.  Pt relates several week h/o heel pain right foot. She relates pain first step in the morning and after periods of rest.  She has also seen Dr. Paulla Dolly in 2017 for PT tendonitis of her left ankle.   Current Outpatient Medications on File Prior to Visit  Medication Sig  . albuterol (PROAIR HFA) 108 (90 Base) MCG/ACT inhaler INHALE 2 PUFFS INTO THE LUNGS EVERY 6 (SIX) HOURS AS NEEDED FOR WHEEZING.  Marland Kitchen amLODipine (NORVASC) 10 MG tablet TAKE 1 TABLET BY MOUTH EVERY DAY  . aspirin 81 MG EC tablet TAKE 1 TABLET BY MOUTH DAILY. SWALLOW WHOLE.  Marland Kitchen Cholecalciferol (VITAMIN D) 400 UNITS tablet Take 400 Units by mouth daily.    . Coenzyme Q10 (COQ10) 100 MG capsule Take 100 mg by mouth daily.    Marland Kitchen ibuprofen (ADVIL,MOTRIN) 400 MG tablet TAKE 1 TABLET (400 MG TOTAL) BY MOUTH EVERY 6 (SIX) HOURS AS NEEDED FOR PAIN.  Marland Kitchen ipratropium-albuterol (DUONEB) 0.5-2.5 (3) MG/3ML SOLN USE 1 VIAL VIA NEBULIZER EVERY 6 HOURS AS NEEDED  . Misc Natural Products (OSTEO BI-FLEX TRIPLE STRENGTH PO) Take by mouth.  . Multiple Vitamins-Minerals (WOMENS MULTIVITAMIN PO) Take 1 tablet by mouth daily.  Marland Kitchen olmesartan-hydrochlorothiazide (BENICAR HCT) 20-12.5 MG tablet TAKE 1 TABLET BY MOUTH EVERY DAY  . ONETOUCH DELICA LANCETS 99991111 MISC 1 each by Does not apply route 2 (two) times daily. Use to help check blood sugars twice a day Dx E11.9  . pioglitazone (ACTOS) 15 MG tablet TAKE 1 TABLET BY MOUTH EVERY DAY  . potassium chloride (K-DUR) 10 MEQ tablet TAKE 1 TABLET BY MOUTH EVERY DAY   No current facility-administered medications on file prior to visit.      Allergies  Allergen Reactions  . Januvia [Sitagliptin Phosphate]     dizziness  . Metformin And Related Nausea Only  .  Doxycycline Rash     Objective:  Vascular Examination: Capillary refill time immediate x 10 digits.  Dorsalis pedis present b/l.  Posterior tibial pulses present b/l.  Digital hair  present x 10 digits.  Skin temperature gradient WNL b/l.   Dermatological Examination: Skin with normal turgor, texture and tone b/l.  Toenails 1-5 b/l discolored, thick, dystrophic with subungual debris and pain with palpation to nailbeds due to thickness of nails.  Musculoskeletal: Muscle strength 5/5 to all LE muscle groups b/l.  POP to medial tubercle right foot.   Limited ROM left ankle.  No crepitus or joint limitation noted with ROM right LE.  Neurological Examination: Protective sensation intact with 10 gram monofilament bilaterally.  Epicritic sensation present bilaterally.  Vibratory sensation intact bilaterally.   Xray right foot: No evidence of fracture Pes planus foot deformity   Assessment: Painful onychomycosis toenails 1-5 b/l  Plantar fasciitis right foot  Plan: 1. Toenails 1-5 b/l were debrided in length and girth without iatrogenic bleeding. 2. Patient examined today. 3. Continue stretching exercises and use of plantar fascial brace. 4. Discussed shoe gear. Recommended New Balance Sneakers or Skechers. 5. Xray of the right foot taken and reviewed with Brandi Shaffer. 6. Dispensed literature on stretching exercises. 7. Follow up 3 months. 8. Patient to continue soft, supportive shoe gear daily. 9. Patient to report any pedal injuries to medical professional immediately. 10. Patient/POA to call should there  be a concern in the interim.

## 2019-02-17 DIAGNOSIS — H43811 Vitreous degeneration, right eye: Secondary | ICD-10-CM | POA: Diagnosis not present

## 2019-02-17 DIAGNOSIS — E119 Type 2 diabetes mellitus without complications: Secondary | ICD-10-CM | POA: Diagnosis not present

## 2019-02-17 DIAGNOSIS — H2511 Age-related nuclear cataract, right eye: Secondary | ICD-10-CM | POA: Diagnosis not present

## 2019-02-17 DIAGNOSIS — H43812 Vitreous degeneration, left eye: Secondary | ICD-10-CM | POA: Diagnosis not present

## 2019-03-26 ENCOUNTER — Other Ambulatory Visit: Payer: Self-pay | Admitting: Internal Medicine

## 2019-03-26 NOTE — Telephone Encounter (Signed)
No need further high dose vit d  Please change to OTC Vitamin D3 at 2000 units per day, indefinitely. 

## 2019-04-08 ENCOUNTER — Encounter: Payer: Self-pay | Admitting: Podiatry

## 2019-04-08 ENCOUNTER — Other Ambulatory Visit: Payer: Self-pay

## 2019-04-08 ENCOUNTER — Ambulatory Visit (INDEPENDENT_AMBULATORY_CARE_PROVIDER_SITE_OTHER): Payer: Medicare Other | Admitting: Podiatry

## 2019-04-08 DIAGNOSIS — M79674 Pain in right toe(s): Secondary | ICD-10-CM | POA: Diagnosis not present

## 2019-04-08 DIAGNOSIS — B351 Tinea unguium: Secondary | ICD-10-CM | POA: Diagnosis not present

## 2019-04-08 DIAGNOSIS — E119 Type 2 diabetes mellitus without complications: Secondary | ICD-10-CM | POA: Diagnosis not present

## 2019-04-08 DIAGNOSIS — M79675 Pain in left toe(s): Secondary | ICD-10-CM | POA: Diagnosis not present

## 2019-04-08 NOTE — Progress Notes (Signed)
Subjective: Brandi Shaffer presents today for follow up of preventative diabetic foot care and painful mycotic nails b/l that are difficult to trim. Pain interferes with ambulation. Aggravating factors include wearing enclosed shoe gear. Pain is relieved with periodic professional debridement.   Pt states she no longer has pain in her right heel. It has resolved.  Allergies  Allergen Reactions  . Januvia [Sitagliptin Phosphate]     dizziness  . Metformin And Related Nausea Only  . Doxycycline Rash     Objective: There were no vitals filed for this visit.  Vascular Examination:  Capillary refill time to digits immediate b/l, palpable DP pulses b/l, palpable PT pulses b/l, pedal hair present b/l and skin temperature gradient within normal limits b/l  Dermatological Examination: Pedal skin with normal turgor, texture and tone bilaterally, no open wounds bilaterally, no interdigital macerations bilaterally and toenails 1-5 b/l elongated, dystrophic, thickened, crumbly with subungual debris  Musculoskeletal: Normal muscle strength 5/5 to all lower extremity muscle groups bilaterally, no gross bony deformities bilaterally and limited ROM left ankle  Neurological: Protective sensation intact 5/5 intact bilaterally with 10g monofilament b/l and vibratory sensation intact b/l   Hemoglobin A1C Latest Ref Rng & Units 01/06/2019 07/03/2018  HGBA1C 4.6 - 6.5 % 5.9 5.9  Some recent data might be hidden   Assessment: Pain due to onychomycosis of toenails of both feet  Controlled type 2 diabetes mellitus without complication, without long-term current use of insulin (Brandi Shaffer)  Encounter for diabetic foot exam (Brandi Shaffer)  Plan: -Continue diabetic foot care principles. Literature dispensed on today.  -Toenails 1-5 b/l were debrided in length and girth without iatrogenic bleeding. -Patient to continue soft, supportive shoe gear daily. -Patient to report any pedal injuries to medical professional  immediately. -Patient/POA to call should there be question/concern in the interim.  Return in about 3 months (around 07/06/2019).

## 2019-05-01 ENCOUNTER — Other Ambulatory Visit: Payer: Self-pay | Admitting: Internal Medicine

## 2019-05-01 MED ORDER — AMLODIPINE BESYLATE 10 MG PO TABS: 10.0000 mg | ORAL_TABLET | Freq: Every day | ORAL | 2 refills | Status: DC

## 2019-05-01 MED ORDER — ATORVASTATIN CALCIUM 20 MG PO TABS: 20.0000 mg | ORAL_TABLET | Freq: Every day | ORAL | 2 refills | Status: DC

## 2019-05-01 MED ORDER — OLMESARTAN MEDOXOMIL-HCTZ 20-12.5 MG PO TABS: 1.0000 | ORAL_TABLET | Freq: Every day | ORAL | 2 refills | Status: DC

## 2019-05-01 MED ORDER — POTASSIUM CHLORIDE ER 10 MEQ PO TBCR: 10.0000 meq | EXTENDED_RELEASE_TABLET | Freq: Every day | ORAL | 2 refills | Status: DC

## 2019-05-01 NOTE — Telephone Encounter (Signed)
Reviewed chart pt is up-to-date sent refills to [facility].../l[facility]  

## 2019-05-01 NOTE — Telephone Encounter (Signed)
   1.Medication Requested: amLODipine (NORVASC) 10 MG tablet atorvastatin (LIPITOR) 20 MG tablet olmesartan-hydrochlorothiazide (BENICAR HCT) 20-12.5 MG tablet potassium chloride (K-DUR) 10 MEQ tablet    2. Pharmacy (Name, Street, City):[id number] - [place], [place] - [address]  3. On Med List: yes  4. Last Visit with PCP: 11/10  5. Next visit date with PCP: 07/08/19   Agent: Please be advised that RX refills may take up to 3 business days. We ask that you follow-up with your pharmacy.

## 2019-05-04 MED ORDER — PIOGLITAZONE HCL 15 MG PO TABS: 15.0000 mg | ORAL_TABLET | Freq: Every day | ORAL | 1 refills | Status: DC

## 2019-05-04 NOTE — Addendum Note (Signed)
Addended by: Karle Barr on: 05/04/2019 03:14 PM   Modules accepted: Orders

## 2019-06-11 DIAGNOSIS — E119 Type 2 diabetes mellitus without complications: Secondary | ICD-10-CM | POA: Diagnosis not present

## 2019-06-11 DIAGNOSIS — H25093 Other age-related incipient cataract, bilateral: Secondary | ICD-10-CM | POA: Diagnosis not present

## 2019-06-11 LAB — HM DIABETES EYE EXAM

## 2019-06-17 ENCOUNTER — Encounter: Payer: Self-pay | Admitting: Internal Medicine

## 2019-06-23 ENCOUNTER — Other Ambulatory Visit: Payer: Self-pay | Admitting: Internal Medicine

## 2019-06-23 NOTE — Telephone Encounter (Signed)
Please refill as per office routine med refill policy (all routine meds refilled for 3 mo or monthly per pt preference up to one year from last visit, then month to month grace period for 3 mo, then further med refills will have to be denied)  

## 2019-07-08 ENCOUNTER — Encounter: Payer: Self-pay | Admitting: Internal Medicine

## 2019-07-08 ENCOUNTER — Other Ambulatory Visit: Payer: Self-pay

## 2019-07-08 ENCOUNTER — Other Ambulatory Visit: Payer: Self-pay | Admitting: Internal Medicine

## 2019-07-08 ENCOUNTER — Ambulatory Visit (INDEPENDENT_AMBULATORY_CARE_PROVIDER_SITE_OTHER): Payer: Medicare Other | Admitting: Internal Medicine

## 2019-07-08 VITALS — BP 180/102 | HR 78 | Temp 98.2°F | Ht 59.0 in | Wt 194.0 lb

## 2019-07-08 DIAGNOSIS — E538 Deficiency of other specified B group vitamins: Secondary | ICD-10-CM

## 2019-07-08 DIAGNOSIS — E119 Type 2 diabetes mellitus without complications: Secondary | ICD-10-CM

## 2019-07-08 DIAGNOSIS — Z0000 Encounter for general adult medical examination without abnormal findings: Secondary | ICD-10-CM

## 2019-07-08 DIAGNOSIS — E559 Vitamin D deficiency, unspecified: Secondary | ICD-10-CM | POA: Diagnosis not present

## 2019-07-08 DIAGNOSIS — I10 Essential (primary) hypertension: Secondary | ICD-10-CM

## 2019-07-08 LAB — HEMOGLOBIN A1C: Hgb A1c MFr Bld: 5.9 % (ref 4.6–6.5)

## 2019-07-08 LAB — URINALYSIS, ROUTINE W REFLEX MICROSCOPIC
Bilirubin Urine: NEGATIVE
Hgb urine dipstick: NEGATIVE
Ketones, ur: NEGATIVE
Nitrite: NEGATIVE
Specific Gravity, Urine: 1.015 (ref 1.000–1.030)
Total Protein, Urine: NEGATIVE
Urine Glucose: NEGATIVE
Urobilinogen, UA: 0.2 (ref 0.0–1.0)
pH: 7 (ref 5.0–8.0)

## 2019-07-08 LAB — CBC WITH DIFFERENTIAL/PLATELET
Basophils Absolute: 0.1 10*3/uL (ref 0.0–0.1)
Basophils Relative: 1 % (ref 0.0–3.0)
Eosinophils Absolute: 0.3 10*3/uL (ref 0.0–0.7)
Eosinophils Relative: 4.2 % (ref 0.0–5.0)
HCT: 40.1 % (ref 36.0–46.0)
Hemoglobin: 13.4 g/dL (ref 12.0–15.0)
Lymphocytes Relative: 47.2 % — ABNORMAL HIGH (ref 12.0–46.0)
Lymphs Abs: 2.8 10*3/uL (ref 0.7–4.0)
MCHC: 33.5 g/dL (ref 30.0–36.0)
MCV: 93.4 fl (ref 78.0–100.0)
Monocytes Absolute: 0.5 10*3/uL (ref 0.1–1.0)
Monocytes Relative: 8.6 % (ref 3.0–12.0)
Neutro Abs: 2.3 10*3/uL (ref 1.4–7.7)
Neutrophils Relative %: 39 % — ABNORMAL LOW (ref 43.0–77.0)
Platelets: 287 10*3/uL (ref 150.0–400.0)
RBC: 4.3 Mil/uL (ref 3.87–5.11)
RDW: 13.9 % (ref 11.5–15.5)
WBC: 6 10*3/uL (ref 4.0–10.5)

## 2019-07-08 LAB — BASIC METABOLIC PANEL
BUN: 18 mg/dL (ref 6–23)
CO2: 29 mEq/L (ref 19–32)
Calcium: 9.5 mg/dL (ref 8.4–10.5)
Chloride: 106 mEq/L (ref 96–112)
Creatinine, Ser: 0.93 mg/dL (ref 0.40–1.20)
GFR: 71.2 mL/min (ref >60.00)
Glucose, Bld: 92 mg/dL (ref 70–99)
Potassium: 4.3 mEq/L (ref 3.5–5.1)
Sodium: 142 mEq/L (ref 135–145)

## 2019-07-08 LAB — HEPATIC FUNCTION PANEL
ALT: 12 U/L (ref 0–35)
AST: 18 U/L (ref 0–37)
Albumin: 4.3 g/dL (ref 3.5–5.2)
Alkaline Phosphatase: 112 U/L (ref 39–117)
Bilirubin, Direct: 0.1 mg/dL (ref 0.0–0.3)
Total Bilirubin: 0.5 mg/dL (ref 0.2–1.2)
Total Protein: 7.2 g/dL (ref 6.0–8.3)

## 2019-07-08 LAB — LIPID PANEL
Cholesterol: 196 mg/dL (ref 0–200)
HDL: 54.1 mg/dL (ref >39.00)
LDL Cholesterol: 129 mg/dL — ABNORMAL HIGH (ref 0–99)
NonHDL: 142.11
Total CHOL/HDL Ratio: 4
Triglycerides: 66 mg/dL (ref 0.0–149.0)
VLDL: 13.2 mg/dL (ref 0.0–40.0)

## 2019-07-08 LAB — MICROALBUMIN / CREATININE URINE RATIO
Creatinine,U: 45.5 mg/dL
Microalb Creat Ratio: 1.5 mg/g (ref 0.0–30.0)
Microalb, Ur: <0.7 mg/dL (ref 0.0–1.9)

## 2019-07-08 LAB — VITAMIN D 25 HYDROXY (VIT D DEFICIENCY, FRACTURES): VITD: 21.38 ng/mL — ABNORMAL LOW (ref 30.00–100.00)

## 2019-07-08 LAB — TSH: TSH: 1.52 u[IU]/mL (ref 0.35–4.50)

## 2019-07-08 LAB — VITAMIN B12: Vitamin B-12: 782 pg/mL (ref 211–911)

## 2019-07-08 MED ORDER — ATORVASTATIN CALCIUM 40 MG PO TABS
40.0000 mg | ORAL_TABLET | Freq: Every day | ORAL | 3 refills | Status: DC
Start: 2019-07-08 — End: 2020-06-20

## 2019-07-08 MED ORDER — VITAMIN D (ERGOCALCIFEROL) 1.25 MG (50000 UNIT) PO CAPS: 50000.0000 [IU] | ORAL_CAPSULE | ORAL | 0 refills | Status: DC

## 2019-07-08 MED ORDER — TELMISARTAN-HCTZ 80-12.5 MG PO TABS: 1.0000 | ORAL_TABLET | Freq: Every day | ORAL | 3 refills | Status: DC

## 2019-07-08 NOTE — Assessment & Plan Note (Signed)
stable overall by history and exam, recent data reviewed with pt, and pt to continue medical treatment as before,  to f/u any worsening symptoms or concerns  

## 2019-07-08 NOTE — Telephone Encounter (Signed)
Please change to OTC Vitamin D3 at 2000 units per day, indefinitely.  

## 2019-07-08 NOTE — Progress Notes (Signed)
Subjective:    Patient ID: Brandi Shaffer, female    DOB: 09/18/1945, 74 y.o.   MRN: [id number]  HPI  Here for wellness and f/u;  Overall doing ok;  Pt denies Chest pain, worsening SOB, DOE, wheezing, orthopnea, PND, worsening LE edema, palpitations, dizziness or syncope.  Pt denies neurological change such as new headache, facial or extremity weakness.  Pt denies polydipsia, polyuria, or low sugar symptoms. Pt states overall good compliance with treatment and medications, good tolerability, and has been trying to follow appropriate diet.  Pt denies worsening depressive symptoms, suicidal ideation or panic. No fever, night sweats, wt loss, loss of appetite, or other constitutional symptoms.  Pt states good ability with ADL's, has low fall risk, home safety reviewed and adequate, no other significant changes in hearing or vision, and only occasionally active with exercise. BP Readings from Last 3 Encounters:  07/08/19 (!) 162/102  01/06/19 (!) 144/88  01/06/19 (!) 144/88  Misses the olmesartan HCT b/c of "flutters" in the chest with taking.  Had her covid shots and seemed to help her arthritis, not taking anything for pain for that now.  Needs handicapped parking for chronic left ankle pain Past Medical History:  Diagnosis Date  . ANEMIA-IRON DEFICIENCY 12/06/2009  . Anxiety state 07/01/2015  . Asthma 12/05/2011  . DIABETES MELLITUS, TYPE II 09/17/2006  . DIVERTICULOSIS, COLON 12/06/2009  . GENITAL HERPES 03/24/2010  . GERD (gastroesophageal reflux disease) 06/28/2011  . HYPERLIPIDEMIA 03/24/2010  . HYPERTENSION 09/17/2006  . HYPOTHYROIDISM 09/17/2006  . LIPOMA 12/06/2009  . PERIPHERAL EDEMA 03/24/2010  . Renal cyst 07/05/2011   1.9 cm minimally complex   Past Surgical History:  Procedure Laterality Date  . ABDOMINAL HYSTERECTOMY     fibroids  . ankle surgury     x 3 left - chronic pain/swelling  . SHOULDER SURGERY Left   . TONSILLECTOMY    . TUBAL LIGATION      reports that she has never  smoked. She has never used smokeless tobacco. She reports that she does not drink alcohol or use drugs. family history includes Diabetes in an other family member; Heart disease in her sister; Hypertension in her father and mother; Joint hypermobility in an other family member; Prostate cancer in her brother; Stroke in her sister. Allergies  Allergen Reactions  . Januvia [Sitagliptin Phosphate]     dizziness  . Metformin And Related Nausea Only  . Doxycycline Rash   Current Outpatient Medications on File Prior to Visit  Medication Sig Dispense Refill  . albuterol (PROAIR HFA) 108 (90 Base) MCG/ACT inhaler TAKE 2 PUFFS BY MOUTH EVERY 6 HOURS AS NEEDED FOR WHEEZE 8.5 g 5  . amLODipine (NORVASC) 10 MG tablet Take 1 tablet (10 mg total) by mouth daily. 90 tablet 2  . aspirin 81 MG EC tablet TAKE 1 TABLET BY MOUTH DAILY. SWALLOW WHOLE. 30 tablet 11  . Cholecalciferol (VITAMIN D) 400 UNITS tablet Take 400 Units by mouth daily.      . Coenzyme Q10 (COQ10) 100 MG capsule Take 100 mg by mouth daily.      Marland Kitchen COVID-19 mRNA vaccine, Pfizer, 30 MCG/0.3ML SUSP Inject into the muscle.    . furosemide (LASIX) 20 MG tablet TAKE 1 TABLET BY MOUTH EVERY DAY 90 tablet 3  . ibuprofen (ADVIL,MOTRIN) 400 MG tablet TAKE 1 TABLET (400 MG TOTAL) BY MOUTH EVERY 6 (SIX) HOURS AS NEEDED FOR PAIN. 60 tablet 5  . ipratropium-albuterol (DUONEB) 0.5-2.5 (3) MG/3ML SOLN USE 1 VIAL  VIA NEBULIZER EVERY 6 HOURS AS NEEDED 360 mL 2  . Misc Natural Products (OSTEO BI-FLEX TRIPLE STRENGTH PO) Take by mouth.    . Multiple Vitamins-Minerals (WOMENS MULTIVITAMIN PO) Take 1 tablet by mouth daily.    Letta Pate DELICA LANCETS 33G MISC 1 each by Does not apply route 2 (two) times daily. Use to help check blood sugars twice a day Dx E11.9 200 each 11  . ONETOUCH VERIO test strip USE 2 (TWO) TIMES DAILY. DX E11.9 100 strip 4  . pioglitazone (ACTOS) 15 MG tablet Take 1 tablet (15 mg total) by mouth daily. 90 tablet 1  . potassium chloride  (KLOR-CON) 10 MEQ tablet Take 1 tablet (10 mEq total) by mouth daily. 90 tablet 2   No current facility-administered medications on file prior to visit.   Review of Systems All otherwise neg per pt     Objective:   Physical Exam BP (!) 180/102   Pulse 78   Temp 98.2 F (36.8 C) (Oral)   Ht 4\' 11"  (1.499 m)   Wt 194 lb (88 kg)   SpO2 99%   BMI 39.18 kg/m  VS noted,  Constitutional: Pt appears in NAD HENT: Head: NCAT.  Right Ear: External ear normal.  Left Ear: External ear normal.  Eyes: . Pupils are equal, round, and reactive to light. Conjunctivae and EOM are normal Nose: without d/c or deformity Neck: Neck supple. Gross normal ROM Cardiovascular: Normal rate and regular rhythm.   Pulmonary/Chest: Effort normal and breath sounds without rales or wheezing.  Abd:  Soft, NT, ND, + BS, no organomegaly Neurological: Pt is alert. At baseline orientation, motor grossly intact Skin: Skin is warm. No rashes, other new lesions, no LE edema Psychiatric: Pt behavior is normal without agitation  All otherwise neg per pt Lab Results  Component Value Date   WBC 6.0 07/08/2019   HGB 13.4 07/08/2019   HCT 40.1 07/08/2019   PLT 287.0 07/08/2019   GLUCOSE 92 07/08/2019   CHOL 196 07/08/2019   TRIG 66.0 07/08/2019   HDL 54.10 07/08/2019   LDLDIRECT 134.6 05/23/2010   LDLCALC 129 (H) 07/08/2019   ALT 12 07/08/2019   AST 18 07/08/2019   NA 142 07/08/2019   K 4.3 07/08/2019   CL 106 07/08/2019   CREATININE 0.93 07/08/2019   BUN 18 07/08/2019   CO2 29 07/08/2019   TSH 1.52 07/08/2019   HGBA1C 5.9 07/08/2019   MICROALBUR <0.7 07/08/2019      Assessment & Plan:

## 2019-07-08 NOTE — Patient Instructions (Signed)
Ok to stop the olmesartan HCT  Please take all new medication as prescribed - the generic for micardis HCT 80/12.5 (which also has some fluid pill in it)  You are given the handicapped parking application today  Please continue all other medications as before, and refills have been done if requested.  Please have the pharmacy call with any other refills you may need.  Please continue your efforts at being more active, low cholesterol diet, and weight control.  You are otherwise up to date with prevention measures today.  Please keep your appointments with your specialists as you may have planned  Please go to the LAB at the blood drawing area for the tests to be done  You will be contacted by phone if any changes need to be made immediately.  Otherwise, you will receive a letter about your results with an explanation, but please check with MyChart first.  Please remember to sign up for MyChart if you have not done so, as this will be important to you in the future with finding out test results, communicating by private email, and scheduling acute appointments online when needed.  Please make an Appointment to return in 6 months, or sooner if needed, also with Lab Appointment for testing done 3-5 days before at the [facility] (so this is for TWO appointments - please see the scheduling desk as you leave)

## 2019-07-08 NOTE — Assessment & Plan Note (Signed)

## 2019-07-08 NOTE — Assessment & Plan Note (Signed)
Ok for micardis hct 80/12.5

## 2019-07-10 ENCOUNTER — Other Ambulatory Visit: Payer: Self-pay

## 2019-07-10 ENCOUNTER — Encounter: Payer: Self-pay | Admitting: Podiatry

## 2019-07-10 ENCOUNTER — Ambulatory Visit (INDEPENDENT_AMBULATORY_CARE_PROVIDER_SITE_OTHER): Payer: Medicare Other | Admitting: Podiatry

## 2019-07-10 VITALS — Temp 97.2°F

## 2019-07-10 DIAGNOSIS — B351 Tinea unguium: Secondary | ICD-10-CM | POA: Diagnosis not present

## 2019-07-10 DIAGNOSIS — E119 Type 2 diabetes mellitus without complications: Secondary | ICD-10-CM

## 2019-07-10 DIAGNOSIS — M79674 Pain in right toe(s): Secondary | ICD-10-CM | POA: Diagnosis not present

## 2019-07-10 DIAGNOSIS — M79675 Pain in left toe(s): Secondary | ICD-10-CM | POA: Diagnosis not present

## 2019-07-10 DIAGNOSIS — B353 Tinea pedis: Secondary | ICD-10-CM

## 2019-07-10 MED ORDER — CLOTRIMAZOLE-BETAMETHASONE 1-0.05 % EX CREA: TOPICAL_CREAM | CUTANEOUS | 1 refills | Status: DC

## 2019-07-10 NOTE — Patient Instructions (Addendum)
Athlete's Foot  Athlete's foot (tinea pedis) is a fungal infection of the skin on your feet. It often occurs on the skin that is between or underneath the toes. It can also occur on the soles of your feet. The infection can spread from person to person (is contagious). It can also spread when a person's bare feet come in contact with the fungus on shower floors or on items such as shoes. What are the causes? This condition is caused by a fungus that grows in warm, moist places. You can get athlete's foot by sharing shoes, shower stalls, towels, and wet floors with someone who is infected. Not washing your feet or changing your socks often enough can also lead to athlete's foot. What increases the risk? This condition is more likely to develop in:  Men.  People who have a weak body defense system (immune system).  People who have diabetes.  People who use public showers, such as at a gym.  People who wear heavy-duty shoes, such as industrial or military shoes.  Seasons with warm, humid weather. What are the signs or symptoms? Symptoms of this condition include:  Itchy areas between your toes or on the soles of your feet.  White, flaky, or scaly areas between your toes or on the soles of your feet.  Very itchy small blisters between your toes or on the soles of your feet.  Small cuts in your skin. These cuts can become infected.  Thick or discolored toenails. How is this diagnosed? This condition may be diagnosed with a physical exam and a review of your medical history. Your health care provider may also take a skin or toenail sample to examine under a microscope. How is this treated? This condition is treated with antifungal medicines. These may be applied as powders, ointments, or creams. In severe cases, an oral antifungal medicine may be given. Follow these instructions at home: Medicines  Apply or take over-the-counter and prescription medicines only as told by your health  care provider.  Apply your antifungal medicine as told by your health care provider. Do not stop using the antifungal even if your condition improves. Foot care  Do not scratch your feet.  Keep your feet dry: ? Wear cotton or wool socks. Change your socks every day or if they become wet. ? Wear shoes that allow air to flow, such as sandals or canvas tennis shoes.  Wash and dry your feet, including the area between your toes. Also, wash and dry your feet: ? Every day or as told by your health care provider. ? After exercising. General instructions  Do not let others use towels, shoes, nail clippers, or other personal items that touch your feet.  Protect your feet by wearing sandals in wet areas, such as locker rooms and shared showers.  Keep all follow-up visits as told by your health care provider. This is important.  If you have diabetes, keep your blood sugar under control. Contact a health care provider if:  You have a fever.  You have swelling, soreness, warmth, or redness in your foot.  Your feet are not getting better with treatment.  Your symptoms get worse.  You have new symptoms. Summary  Athlete's foot (tinea pedis) is a fungal infection of the skin on your feet. It often occurs on skin that is between or underneath the toes.  This condition is caused by a fungus that grows in warm, moist places.  Symptoms include white, flaky, or scaly areas between   your toes or on the soles of your feet.  This condition is treated with antifungal medicines.  Keep your feet clean. Always dry them thoroughly. This information is not intended to replace advice given to you by your health care provider. Make sure you discuss any questions you have with your health care provider. Document Revised: 02/07/2017 Document Reviewed: 12/03/2016 Elsevier Patient Education  2020 [employer]  Diabetes Mellitus and Foot Care Foot care is an important part of your health, especially  when you have diabetes. Diabetes may cause you to have problems because of poor blood flow (circulation) to your feet and legs, which can cause your skin to:  Become thinner and drier.  Break more easily.  Heal more slowly.  Peel and crack. You may also have nerve damage (neuropathy) in your legs and feet, causing decreased feeling in them. This means that you may not notice minor injuries to your feet that could lead to more serious problems. Noticing and addressing any potential problems early is the best way to prevent future foot problems. How to care for your feet Foot hygiene  Wash your feet daily with warm water and mild soap. Do not use hot water. Then, pat your feet and the areas between your toes until they are completely dry. Do not soak your feet as this can dry your skin.  Trim your toenails straight across. Do not dig under them or around the cuticle. File the edges of your nails with an emery board or nail file.  Apply a moisturizing lotion or petroleum jelly to the skin on your feet and to dry, brittle toenails. Use lotion that does not contain alcohol and is unscented. Do not apply lotion between your toes. Shoes and socks  Wear clean socks or stockings every day. Make sure they are not too tight. Do not wear knee-high stockings since they may decrease blood flow to your legs.  Wear shoes that fit properly and have enough cushioning. Always look in your shoes before you put them on to be sure there are no objects inside.  To break in new shoes, wear them for just a few hours a day. This prevents injuries on your feet. Wounds, scrapes, corns, and calluses  Check your feet daily for blisters, cuts, bruises, sores, and redness. If you cannot see the bottom of your feet, use a mirror or ask someone for help.  Do not cut corns or calluses or try to remove them with medicine.  If you find a minor scrape, cut, or break in the skin on your feet, keep it and the skin around it  clean and dry. You may clean these areas with mild soap and water. Do not clean the area with peroxide, alcohol, or iodine.  If you have a wound, scrape, corn, or callus on your foot, look at it several times a day to make sure it is healing and not infected. Check for: ? Redness, swelling, or pain. ? Fluid or blood. ? Warmth. ? Pus or a bad smell. General instructions  Do not cross your legs. This may decrease blood flow to your feet.  Do not use heating pads or hot water bottles on your feet. They may burn your skin. If you have lost feeling in your feet or legs, you may not know this is happening until it is too late.  Protect your feet from hot and cold by wearing shoes, such as at the beach or on hot pavement.  Schedule a complete   foot exam at least once a year (annually) or more often if you have foot problems. If you have foot problems, report any cuts, sores, or bruises to your health care provider immediately. Contact a health care provider if:  You have a medical condition that increases your risk of infection and you have any cuts, sores, or bruises on your feet.  You have an injury that is not healing.  You have redness on your legs or feet.  You feel burning or tingling in your legs or feet.  You have pain or cramps in your legs and feet.  Your legs or feet are numb.  Your feet always feel cold.  You have pain around a toenail. Get help right away if:  You have a wound, scrape, corn, or callus on your foot and: ? You have pain, swelling, or redness that gets worse. ? You have fluid or blood coming from the wound, scrape, corn, or callus. ? Your wound, scrape, corn, or callus feels warm to the touch. ? You have pus or a bad smell coming from the wound, scrape, corn, or callus. ? You have a fever. ? You have a red line going up your leg. Summary  Check your feet every day for cuts, sores, red spots, swelling, and blisters.  Moisturize feet and legs  daily.  Wear shoes that fit properly and have enough cushioning.  If you have foot problems, report any cuts, sores, or bruises to your health care provider immediately.  Schedule a complete foot exam at least once a year (annually) or more often if you have foot problems. This information is not intended to replace advice given to you by your health care provider. Make sure you discuss any questions you have with your health care provider. Document Revised: 11/05/2018 Document Reviewed: 03/16/2016 Elsevier Patient Education  [place].  Peripheral Vascular Disease Peripheral vascular disease (PVD) is a disease of the blood vessels. A simple term for PVD is poor circulation. In most cases, PVD narrows the blood vessels that carry blood from your heart to the rest of your body. This can result in a decreased supply of blood to your arms, legs, and internal organs, like your stomach or kidneys. However, it most often affects a person's lower legs and feet. There are two types of PVD.  Organic PVD. This is the more common type. It is caused by damage to the structure of blood vessels.  Functional PVD. This is caused by conditions that make blood vessels contract and tighten (spasm). Without treatment, PVD tends to get worse over time. PVD can also lead to acute limb ischemia. This is when an arm or leg suddenly has trouble getting enough blood. This is a medical emergency. What are the causes?  Each type of PVD has many different causes. The most common cause of PVD is buildup of a fatty material (plaque) inside your arteries (atherosclerosis). Small amounts of plaque can break off from the walls of the blood vessels and become lodged in a smaller artery. This blocks blood flow and can cause acute limb ischemia. Other common causes of PVD include:  Blood clots that form inside of blood vessels.  Injuries to blood vessels.  Diseases that cause inflammation of blood vessels or cause  blood vessel spasms.  Health behaviors and health history that increase your risk of developing PVD. What increases the risk? You are more likely to develop this condition if:  You have a family history of PVD.  You have certain medical conditions, including: ? High cholesterol. ? Diabetes. ? High blood pressure (hypertension). ? Coronary heart disease. ? Past problems with blood clots. ? Past injury, such as burns or a broken bone. These may have damaged blood vessels in your limbs. ? Buerger disease. This is caused by inflamed blood vessels in your hands and feet. ? Some forms of arthritis. ? Rare birth defects that affect the arteries in your legs. ? Kidney disease.  You use tobacco or smoke.  You do not get enough exercise.  You are obese.  You are age 2 or older. What are the signs or symptoms? This condition may cause different symptoms. Your symptoms depend on what part of your body is not getting enough blood. Some common signs and symptoms include:  Cramps in your lower legs. This may be a symptom of poor leg circulation (claudication).  Pain and weakness in your legs. This happens while you are physically active but goes away when you rest (intermittent claudication).  Leg pain when at rest.  Leg numbness, tingling, or weakness.  Coldness in a leg or foot, especially when compared with the other leg.  Skin or hair changes. These can include: ? Hair loss. ? Shiny skin. ? Pale or bluish skin. ? Thick toenails.  Inability to get or maintain an erection (erectile dysfunction).  Fatigue. People with PVD are more likely to develop ulcers and sores on their toes, feet, or legs. These may take longer than normal to heal. How is this diagnosed? This condition is diagnosed based on:  Your signs and symptoms.  A physical exam and your medical history.  Other tests to find out what is causing your PVD and to determine its severity. Tests may include: ? Blood  pressure recordings from your arms and legs and measurements of the strength of your pulses (pulse volume recordings). ? Imaging studies using sound waves to take pictures of the blood flow through your blood vessels (Doppler ultrasound). ? Injecting a dye into your blood vessels before having imaging studies using:  X-rays (angiogram or arteriogram).  Computer-generated X-rays (CT angiogram).  A powerful electromagnetic field and a computer (magnetic resonance angiogram or MRA). How is this treated? Treatment for PVD depends on the cause of your condition and how severe your symptoms are. It also depends on your age. Underlying causes need to be treated and controlled. These include long-term (chronic) conditions, such as diabetes, high cholesterol, and high blood pressure. Treatment includes:  Lifestyle changes, such as: ? Quitting smoking. ? Exercising regularly. ? Following a low-fat, low-cholesterol diet.  Taking medicines, such as: ? Blood thinners to prevent blood clots. ? Medicines to improve blood flow. ? Medicines to improve your blood cholesterol levels.  Surgical procedures, such as: ? A procedure that uses an inflated balloon to open a blocked artery and improve blood flow (angioplasty). ? A procedure to put in a wire mesh tube to keep a blocked artery open (stent implant). ? Surgery to reroute blood flow around a blocked artery (peripheral bypass surgery). ? Surgery to remove dead tissue from an infected wound on the affected limb. ? Amputation. This is surgical removal of the affected limb. It may be necessary in cases of acute limb ischemia where there has been no improvement through medical or surgical treatments. Follow these instructions at home: Lifestyle  Do not use any products that contain nicotine or tobacco, such as cigarettes and e-cigarettes. If you need help quitting, ask your health care provider.  Lose weight if you are overweight, and maintain a healthy  weight as discussed by your health care provider.  Eat a diet that is low in fat and cholesterol. If you need help, ask your health care provider.  Exercise regularly. Ask your health care provider to suggest some good activities for you. General instructions  Take over-the-counter and prescription medicines only as told by your health care provider.  Take good care of your feet: ? Wear comfortable shoes that fit well. ? Check your feet often for any cuts or sores.  Keep all follow-up visits as told by your health care provider. This is important. Contact a health care provider if:  You have cramps in your legs while walking.  You have leg pain when you are at rest.  You have coldness in a leg or foot.  Your skin changes.  You have erectile dysfunction.  You have cuts or sores on your feet that are not healing. Get help right away if:  Your arm or leg turns cold, numb, and blue.  Your arms or legs become red, warm, swollen, painful, or numb.  You have chest pain or trouble breathing.  You suddenly have weakness in your face, arm, or leg.  You become very confused or lose the ability to speak.  You suddenly have a very bad headache or lose your vision. Summary  Peripheral vascular disease (PVD) is a disease of the blood vessels.  In most cases, PVD narrows the blood vessels that carry blood from your heart to the rest of your body.  PVD may cause different symptoms. Your symptoms depend on what part of your body is not getting enough blood.  Treatment for PVD depends on the cause of your condition and how severe your symptoms are. This information is not intended to replace advice given to you by your health care provider. Make sure you discuss any questions you have with your health care provider. Document Revised: 01/25/2017 Document Reviewed: 03/22/2016 Elsevier Patient Education  2020 [employer].

## 2019-07-14 ENCOUNTER — Telehealth: Payer: Self-pay | Admitting: Internal Medicine

## 2019-07-14 NOTE — Telephone Encounter (Signed)
    Pt c/o medication issue:  1. Name of Medication: telmisartan-hydrochlorothiazide (MICARDIS HCT) 80-12.5 MG tablet  2. How are you currently taking this medication (dosage and times per day)? Stopped taking   3. Are you having a reaction (difficulty breathing--STAT)? no  4. What is your medication issue? Patient states Telmisartan gives her a headache, no energy. Patient went back to taking Olmesartan on her own

## 2019-07-15 MED ORDER — OLMESARTAN MEDOXOMIL-HCTZ 20-12.5 MG PO TABS: 1.0000 | ORAL_TABLET | Freq: Every day | ORAL | 3 refills | Status: DC

## 2019-07-15 NOTE — Telephone Encounter (Signed)
Ok I updated the rx

## 2019-07-18 NOTE — Progress Notes (Signed)
Subjective: Brandi Shaffer presents today preventative diabetic foot care and painful mycotic nails b/l that are difficult to trim. Pain interferes with ambulation. Aggravating factors include wearing enclosed shoe gear. Pain is relieved with periodic professional debridement  Biagio Borg, MD is patient's PCP. Last visit was: 01/06/2019.  Past Medical History:  Diagnosis Date  . ANEMIA-IRON DEFICIENCY 12/06/2009  . Anxiety state 07/01/2015  . Asthma 12/05/2011  . DIABETES MELLITUS, TYPE II 09/17/2006  . DIVERTICULOSIS, COLON 12/06/2009  . GENITAL HERPES 03/24/2010  . GERD (gastroesophageal reflux disease) 06/28/2011  . HYPERLIPIDEMIA 03/24/2010  . HYPERTENSION 09/17/2006  . HYPOTHYROIDISM 09/17/2006  . LIPOMA 12/06/2009  . PERIPHERAL EDEMA 03/24/2010  . Renal cyst 07/05/2011   1.9 cm minimally complex     Current Outpatient Medications on File Prior to Visit  Medication Sig Dispense Refill  . albuterol (PROAIR HFA) 108 (90 Base) MCG/ACT inhaler TAKE 2 PUFFS BY MOUTH EVERY 6 HOURS AS NEEDED FOR WHEEZE 8.5 g 5  . amLODipine (NORVASC) 10 MG tablet Take 1 tablet (10 mg total) by mouth daily. 90 tablet 2  . aspirin 81 MG EC tablet TAKE 1 TABLET BY MOUTH DAILY. SWALLOW WHOLE. 30 tablet 11  . atorvastatin (LIPITOR) 40 MG tablet Take 1 tablet (40 mg total) by mouth daily. 90 tablet 3  . Cholecalciferol (VITAMIN D) 400 UNITS tablet Take 400 Units by mouth daily.      . Coenzyme Q10 (COQ10) 100 MG capsule Take 100 mg by mouth daily.      Marland Kitchen COVID-19 mRNA vaccine, Pfizer, 30 MCG/0.3ML SUSP Inject into the muscle.    . furosemide (LASIX) 20 MG tablet TAKE 1 TABLET BY MOUTH EVERY DAY 90 tablet 3  . ibuprofen (ADVIL,MOTRIN) 400 MG tablet TAKE 1 TABLET (400 MG TOTAL) BY MOUTH EVERY 6 (SIX) HOURS AS NEEDED FOR PAIN. 60 tablet 5  . ipratropium-albuterol (DUONEB) 0.5-2.5 (3) MG/3ML SOLN USE 1 VIAL VIA NEBULIZER EVERY 6 HOURS AS NEEDED 360 mL 2  . Misc Natural Products (OSTEO BI-FLEX TRIPLE STRENGTH PO) Take by  mouth.    . Multiple Vitamins-Minerals (WOMENS MULTIVITAMIN PO) Take 1 tablet by mouth daily.    Glory Rosebush DELICA LANCETS 99991111 MISC 1 each by Does not apply route 2 (two) times daily. Use to help check blood sugars twice a day Dx E11.9 200 each 11  . ONETOUCH VERIO test strip USE 2 (TWO) TIMES DAILY. DX E11.9 100 strip 4  . pioglitazone (ACTOS) 15 MG tablet Take 1 tablet (15 mg total) by mouth daily. 90 tablet 1  . potassium chloride (KLOR-CON) 10 MEQ tablet Take 1 tablet (10 mEq total) by mouth daily. 90 tablet 2  . Vitamin D, Ergocalciferol, (DRISDOL) 1.25 MG (50000 UNIT) CAPS capsule Take 1 capsule (50,000 Units total) by mouth every 7 (seven) days. 12 capsule 0   No current facility-administered medications on file prior to visit.     Allergies  Allergen Reactions  . Januvia [Sitagliptin Phosphate]     dizziness  . Metformin And Related Nausea Only  . Doxycycline Rash    Objective: Brandi Shaffer is a pleasant 74 y.o. y.o. Patient Race: Black or African American [2]  female in NAD. AAO x 3.  Vitals:   07/10/19 1003  Temp: (!) 97.2 F (36.2 C)    Vascular Examination: Neurovascular status unchanged b/l. Capillary refill time to digits immediate b/l. Palpable DP pulses b/l. Palpable PT pulses b/l. Pedal hair present b/l. Skin temperature gradient within normal limits b/l.  Dermatological Examination: Pedal skin with normal turgor, texture and tone bilaterally. No open wounds bilaterally. No interdigital macerations bilaterally. Toenails 1-5 b/l elongated, dystrophic, thickened, crumbly with subungual debris and tenderness to dorsal palpation. Diffuse scaling noted peripherally and plantarly b/l feet with mild foot odor.  No interdigital macerations.  No blisters, no weeping. No signs of secondary bacterial infection noted.  Musculoskeletal: Normal muscle strength 5/5 to all lower extremity muscle groups bilaterally. No gross bony deformities bilaterally. No pain crepitus or  joint limitation noted with ROM b/l. Limited joint ROM to the left ankle.  Neurological Examination: Protective sensation intact 5/5 intact bilaterally with 10g monofilament b/l. Vibratory sensation intact b/l.  Assessment: 1. Pain due to onychomycosis of toenails of both feet   2. Tinea pedis of both feet   3. Controlled type 2 diabetes mellitus without complication, without long-term current use of insulin (HCC)   Plan: -Examined patient. -Toenails 1-5 b/l were debrided in length and girth with sterile nail nippers and dremel without iatrogenic bleeding.  -Patient to continue soft, supportive shoe gear daily. -Patient to report any pedal injuries to medical professional immediately. --For tinea pedis, prescription written for Lotrisone Cream 1%/0.05% to be applied to both feet twice daily. -Patient/POA to call should there be question/concern in the interim.  Return in about 3 months (around 10/10/2019) for diabetic nail trim.  Marzetta Board, DPM

## 2019-07-31 ENCOUNTER — Other Ambulatory Visit: Payer: Self-pay | Admitting: Internal Medicine

## 2019-07-31 NOTE — Telephone Encounter (Signed)
Please let pt know to change to OTC Vitamin D3 at 2000 units per day, indefinitely.  

## 2019-08-05 NOTE — Telephone Encounter (Signed)
Pt was informed to start taking otc vit d3 at 2000 units once a day.

## 2019-08-05 NOTE — Telephone Encounter (Signed)
Error

## 2019-09-03 ENCOUNTER — Other Ambulatory Visit: Payer: Self-pay

## 2019-09-03 ENCOUNTER — Encounter: Payer: Self-pay | Admitting: Internal Medicine

## 2019-09-03 ENCOUNTER — Ambulatory Visit (INDEPENDENT_AMBULATORY_CARE_PROVIDER_SITE_OTHER): Payer: Medicare Other | Admitting: Internal Medicine

## 2019-09-03 VITALS — BP 140/80 | HR 87 | Temp 98.6°F | Ht 59.0 in | Wt 194.0 lb

## 2019-09-03 DIAGNOSIS — I809 Phlebitis and thrombophlebitis of unspecified site: Secondary | ICD-10-CM

## 2019-09-03 DIAGNOSIS — E119 Type 2 diabetes mellitus without complications: Secondary | ICD-10-CM

## 2019-09-03 DIAGNOSIS — I10 Essential (primary) hypertension: Secondary | ICD-10-CM

## 2019-09-03 DIAGNOSIS — E785 Hyperlipidemia, unspecified: Secondary | ICD-10-CM | POA: Diagnosis not present

## 2019-09-03 MED ORDER — LOSARTAN POTASSIUM 50 MG PO TABS
50.0000 mg | ORAL_TABLET | Freq: Every day | ORAL | 3 refills | Status: DC
Start: 2019-09-03 — End: 2020-07-12

## 2019-09-03 NOTE — Patient Instructions (Signed)
Ok to stop the telmisartan as you have  Ok to also stop the amlodipine 10 mg  Please take all new medication as prescribed - the losartan 50 mg per day  Please continue all other medications as before, including the lasix 20 mg per day, and the aspirin 81 mg per day  Ok to take tylenol 500 mg - 1 -2 tab every 8 hours as needed  Ok to also start the generic lipitor for the cholesterol  Please have the pharmacy call with any other refills you may need.  Please continue your efforts at being more active, low cholesterol diet, and weight control.  Please keep your appointments with your specialists as you may have planned

## 2019-09-03 NOTE — Progress Notes (Signed)
Subjective:    Patient ID: Brandi Shaffer, female    DOB: 12/24/1945, 74 y.o.   MRN: [id number]  HPI  Here to f/u; overall doing ok,  Pt denies chest pain, increasing sob or doe, wheezing, orthopnea, PND, palpitations, dizziness or syncope.  Pt denies new neurological symptoms such as new headache, or facial or extremity weakness or numbness.  Pt denies polydipsia, polyuria, or low sugar episode.  Pt states overall good compliance with meds except Could not tolerate telmisartan 80 mg, so instead took amlodipine 10 mg x TWO pills, in addition to the lasix.  BP at home < 140/90, does not want more changes if possible.  Also has an area of tender firmness just under the skin to the left medial mid leg x 3 wks, now much improved  Has not yet started the statin Past Medical History:  Diagnosis Date  . ANEMIA-IRON DEFICIENCY 12/06/2009  . Anxiety state 07/01/2015  . Asthma 12/05/2011  . DIABETES MELLITUS, TYPE II 09/17/2006  . DIVERTICULOSIS, COLON 12/06/2009  . GENITAL HERPES 03/24/2010  . GERD (gastroesophageal reflux disease) 06/28/2011  . HYPERLIPIDEMIA 03/24/2010  . HYPERTENSION 09/17/2006  . HYPOTHYROIDISM 09/17/2006  . LIPOMA 12/06/2009  . PERIPHERAL EDEMA 03/24/2010  . Renal cyst 07/05/2011   1.9 cm minimally complex   Past Surgical History:  Procedure Laterality Date  . ABDOMINAL HYSTERECTOMY     fibroids  . ankle surgury     x 3 left - chronic pain/swelling  . SHOULDER SURGERY Left   . TONSILLECTOMY    . TUBAL LIGATION      reports that she has never smoked. She has never used smokeless tobacco. She reports that she does not drink alcohol and does not use drugs. family history includes Diabetes in an other family member; Heart disease in her sister; Hypertension in her father and mother; Joint hypermobility in an other family member; Prostate cancer in her brother; Stroke in her sister. Allergies  Allergen Reactions  . Januvia [Sitagliptin Phosphate]     dizziness  . Metformin And  Related Nausea Only  . Doxycycline Rash   Current Outpatient Medications on File Prior to Visit  Medication Sig Dispense Refill  . albuterol (PROAIR HFA) 108 (90 Base) MCG/ACT inhaler TAKE 2 PUFFS BY MOUTH EVERY 6 HOURS AS NEEDED FOR WHEEZE 8.5 g 5  . aspirin 81 MG EC tablet TAKE 1 TABLET BY MOUTH DAILY. SWALLOW WHOLE. 30 tablet 11  . atorvastatin (LIPITOR) 40 MG tablet Take 1 tablet (40 mg total) by mouth daily. 90 tablet 3  . Cholecalciferol (VITAMIN D) 400 UNITS tablet Take 400 Units by mouth daily.      . clotrimazole-betamethasone (LOTRISONE) cream Apply to both feet and between toes bid x 4 weeks. 45 g 1  . Coenzyme Q10 (COQ10) 100 MG capsule Take 100 mg by mouth daily.      Marland Kitchen COVID-19 mRNA vaccine, Pfizer, 30 MCG/0.3ML SUSP Inject into the muscle.    . furosemide (LASIX) 20 MG tablet TAKE 1 TABLET BY MOUTH EVERY DAY 90 tablet 3  . ibuprofen (ADVIL,MOTRIN) 400 MG tablet TAKE 1 TABLET (400 MG TOTAL) BY MOUTH EVERY 6 (SIX) HOURS AS NEEDED FOR PAIN. 60 tablet 5  . ipratropium-albuterol (DUONEB) 0.5-2.5 (3) MG/3ML SOLN USE 1 VIAL VIA NEBULIZER EVERY 6 HOURS AS NEEDED 360 mL 2  . Misc Natural Products (OSTEO BI-FLEX TRIPLE STRENGTH PO) Take by mouth.    . Multiple Vitamins-Minerals (WOMENS MULTIVITAMIN PO) Take 1 tablet by mouth daily.    Marland Kitchen  ONETOUCH DELICA LANCETS 33G MISC 1 each by Does not apply route 2 (two) times daily. Use to help check blood sugars twice a day Dx E11.9 200 each 11  . ONETOUCH VERIO test strip USE 2 (TWO) TIMES DAILY. DX E11.9 100 strip 4  . pioglitazone (ACTOS) 15 MG tablet Take 1 tablet (15 mg total) by mouth daily. 90 tablet 1  . potassium chloride (KLOR-CON) 10 MEQ tablet Take 1 tablet (10 mEq total) by mouth daily. 90 tablet 2  . Vitamin D, Ergocalciferol, (DRISDOL) 1.25 MG (50000 UNIT) CAPS capsule Take 1 capsule (50,000 Units total) by mouth every 7 (seven) days. 12 capsule 0   No current facility-administered medications on file prior to visit.   Review of  Systems All otherwise neg per pt     Objective:   Physical Exam BP 140/80 (BP Location: Left Arm, Patient Position: Sitting, Cuff Size: Large)   Pulse 87   Temp 98.6 F (37 C) (Oral)   Ht 4\' 11"  (1.499 m)   Wt 194 lb (88 kg)   SpO2 98%   BMI 39.18 kg/m  VS noted,  Constitutional: Pt appears in NAD HENT: Head: NCAT.  Right Ear: External ear normal.  Left Ear: External ear normal.  Eyes: . Pupils are equal, round, and reactive to light. Conjunctivae and EOM are normal Nose: without d/c or deformity Neck: Neck supple. Gross normal ROM Cardiovascular: Normal rate and regular rhythm.   Pulmonary/Chest: Effort normal and breath sounds without rales or wheezing.  Abd:  Soft, NT, ND, + BS, no organomegaly Neurological: Pt is alert. At baseline orientation, motor grossly intact Skin: Skin is warm and mild tender to1 cm area of superfic phlebitis to mild left medial leg, also bilat trace LE edema Psychiatric: Pt behavior is normal without agitation  All otherwise neg per pt Lab Results  Component Value Date   WBC 6.0 07/08/2019   HGB 13.4 07/08/2019   HCT 40.1 07/08/2019   PLT 287.0 07/08/2019   GLUCOSE 92 07/08/2019   CHOL 196 07/08/2019   TRIG 66.0 07/08/2019   HDL 54.10 07/08/2019   LDLDIRECT 134.6 05/23/2010   LDLCALC 129 (H) 07/08/2019   ALT 12 07/08/2019   AST 18 07/08/2019   NA 142 07/08/2019   K 4.3 07/08/2019   CL 106 07/08/2019   CREATININE 0.93 07/08/2019   BUN 18 07/08/2019   CO2 29 07/08/2019   TSH 1.52 07/08/2019   HGBA1C 5.9 07/08/2019   MICROALBUR <0.7 07/08/2019      Assessment & Plan:

## 2019-09-06 ENCOUNTER — Encounter: Payer: Self-pay | Admitting: Internal Medicine

## 2019-09-06 NOTE — Assessment & Plan Note (Signed)
Ok to [employer] due to swelling, and d/c telmisartan as she has,, and add losartan 50 qd, cont lasix as rx

## 2019-09-06 NOTE — Assessment & Plan Note (Addendum)
Mild, improving, d/w pt natural hx, no other specific tx needed, cont asa 81 qd  I spent 31 minutes in preparing to see the patient by review of recent labs, imaging and procedures, obtaining and reviewing separately obtained history, communicating with the patient and family or caregiver, ordering medications, tests or procedures, and documenting clinical information in the EHR including the differential Dx, treatment, and any further evaluation and other management of suprficial phlebitis, hld, htn with leg swelling, and dm

## 2019-09-06 NOTE — Assessment & Plan Note (Signed)
Pt reassured, and asked to start the statin as rx, follow low chol diet

## 2019-09-06 NOTE — Assessment & Plan Note (Signed)
stable overall by history and exam, recent data reviewed with pt, and pt to continue medical treatment as before,  to f/u any worsening symptoms or concerns  

## 2019-09-25 ENCOUNTER — Other Ambulatory Visit: Payer: Self-pay

## 2019-09-25 ENCOUNTER — Ambulatory Visit (HOSPITAL_COMMUNITY)
Admission: EM | Admit: 2019-09-25 | Discharge: 2019-09-25 | Disposition: A | Discharge status: Home / Self Care | Payer: Medicare Other

## 2019-09-25 ENCOUNTER — Encounter (HOSPITAL_COMMUNITY): Payer: Self-pay

## 2019-09-25 DIAGNOSIS — R030 Elevated blood-pressure reading, without diagnosis of hypertension: Secondary | ICD-10-CM

## 2019-09-25 DIAGNOSIS — I10 Essential (primary) hypertension: Secondary | ICD-10-CM

## 2019-09-25 MED ORDER — AMLODIPINE BESYLATE 5 MG PO TABS
5.0000 mg | ORAL_TABLET | Freq: Every day | ORAL | 0 refills | Status: DC
Start: 2019-09-25 — End: 2019-09-29

## 2019-09-25 NOTE — ED Provider Notes (Signed)
[facility]   MRN: [id number] DOB: 1946-01-15  Subjective:   Brandi Shaffer is a 74 y.o. female presenting for recheck on her blood pressure.  Patient states that she has gotten persistently elevated blood pressure readings in the 190s and 200s systolic at home.  Reports that she had a recent office visit about 3 weeks ago and was taken off amlodipine, switch to losartan.  Per the instructions on chart review patient was also on telmisartan and stopped this medication.  She was advised to continue Lasix.  Since then, patient states that her blood pressure has been significantly elevated and losartan is not helping.  No current facility-administered medications for this encounter.  Current Outpatient Medications:  .  pioglitazone (ACTOS) 15 MG tablet, Take 15 mg by mouth daily., Disp: , Rfl:  .  albuterol (PROAIR HFA) 108 (90 Base) MCG/ACT inhaler, TAKE 2 PUFFS BY MOUTH EVERY 6 HOURS AS NEEDED FOR WHEEZE, Disp: 8.5 g, Rfl: 5 .  aspirin 81 MG EC tablet, TAKE 1 TABLET BY MOUTH DAILY. SWALLOW WHOLE., Disp: 30 tablet, Rfl: 11 .  atorvastatin (LIPITOR) 40 MG tablet, Take 1 tablet (40 mg total) by mouth daily., Disp: 90 tablet, Rfl: 3 .  Cholecalciferol (VITAMIN D) 400 UNITS tablet, Take 400 Units by mouth daily.  , Disp: , Rfl:  .  clotrimazole-betamethasone (LOTRISONE) cream, Apply to both feet and between toes bid x 4 weeks., Disp: 45 g, Rfl: 1 .  COVID-19 mRNA vaccine, Pfizer, 30 MCG/0.3ML SUSP, Inject into the muscle., Disp: , Rfl:  .  furosemide (LASIX) 20 MG tablet, TAKE 1 TABLET BY MOUTH EVERY DAY, Disp: 90 tablet, Rfl: 3 .  ibuprofen (ADVIL,MOTRIN) 400 MG tablet, TAKE 1 TABLET (400 MG TOTAL) BY MOUTH EVERY 6 (SIX) HOURS AS NEEDED FOR PAIN., Disp: 60 tablet, Rfl: 5 .  ipratropium-albuterol (DUONEB) 0.5-2.5 (3) MG/3ML SOLN, USE 1 VIAL VIA NEBULIZER EVERY 6 HOURS AS NEEDED, Disp: 360 mL, Rfl: 2 .  losartan (COZAAR) 50 MG tablet, Take 1 tablet (50 mg total) by mouth daily., Disp: 90  tablet, Rfl: 3 .  Misc Natural Products (OSTEO BI-FLEX TRIPLE STRENGTH PO), Take by mouth., Disp: , Rfl:  .  Multiple Vitamins-Minerals (WOMENS MULTIVITAMIN PO), Take 1 tablet by mouth daily., Disp: , Rfl:  .  ONETOUCH DELICA LANCETS 33G MISC, 1 each by Does not apply route 2 (two) times daily. Use to help check blood sugars twice a day Dx E11.9, Disp: 200 each, Rfl: 11 .  ONETOUCH VERIO test strip, USE 2 (TWO) TIMES DAILY. DX E11.9, Disp: 100 strip, Rfl: 4 .  potassium chloride (KLOR-CON) 10 MEQ tablet, Take 1 tablet (10 mEq total) by mouth daily., Disp: 90 tablet, Rfl: 2 .  Vitamin D, Ergocalciferol, (DRISDOL) 1.25 MG (50000 UNIT) CAPS capsule, Take 1 capsule (50,000 Units total) by mouth every 7 (seven) days., Disp: 12 capsule, Rfl: 0   Allergies  Allergen Reactions  . Januvia [Sitagliptin Phosphate]     dizziness  . Metformin And Related Nausea Only  . Doxycycline Rash    Past Medical History:  Diagnosis Date  . ANEMIA-IRON DEFICIENCY 12/06/2009  . Anxiety state 07/01/2015  . Asthma 12/05/2011  . DIABETES MELLITUS, TYPE II 09/17/2006  . DIVERTICULOSIS, COLON 12/06/2009  . GENITAL HERPES 03/24/2010  . GERD (gastroesophageal reflux disease) 06/28/2011  . HYPERLIPIDEMIA 03/24/2010  . HYPERTENSION 09/17/2006  . HYPOTHYROIDISM 09/17/2006  . LIPOMA 12/06/2009  . PERIPHERAL EDEMA 03/24/2010  . Renal cyst 07/05/2011   1.9 cm minimally  complex     Past Surgical History:  Procedure Laterality Date  . ABDOMINAL HYSTERECTOMY     fibroids  . ankle surgury     x 3 left - chronic pain/swelling  . SHOULDER SURGERY Left   . TONSILLECTOMY    . TUBAL LIGATION      Family History  Problem Relation Age of Onset  . Hypertension Mother   . Hypertension Father   . Heart disease Sister        sudden death early 45's  . Stroke Sister   . Prostate cancer Brother        cancer  . Diabetes Other   . Joint hypermobility Other        DJD    Social History   Tobacco Use  . Smoking status: Never  Smoker  . Smokeless tobacco: Never Used  Vaping Use  . Vaping Use: Never used  Substance Use Topics  . Alcohol use: No    Alcohol/week: 0.0 standard drinks  . Drug use: No    Review of Systems  Constitutional: Negative for fever and malaise/fatigue.  HENT: Negative for congestion, ear pain, sinus pain and sore throat.   Eyes: Negative for blurred vision, double vision, discharge and redness.  Respiratory: Negative for cough, hemoptysis, shortness of breath and wheezing.   Cardiovascular: Negative for chest pain.  Gastrointestinal: Negative for abdominal pain, diarrhea, nausea and vomiting.  Genitourinary: Negative for dysuria, flank pain and hematuria.  Musculoskeletal: Negative for myalgias.  Skin: Negative for rash.  Neurological: Negative for dizziness, tingling, sensory change, speech change, focal weakness, weakness and headaches.  Psychiatric/Behavioral: Negative for depression and substance abuse.     Objective:   Vitals: BP (!) 219/101   Pulse 87   Temp 98 F (36.7 C)   Resp 16   SpO2 95%   BP recheck 199/65.   Wt Readings from Last 3 Encounters:  09/03/19 194 lb (88 kg)  07/08/19 194 lb (88 kg)  01/06/19 200 lb (90.7 kg)   Temp Readings from Last 3 Encounters:  09/25/19 98 F (36.7 C)  09/03/19 98.6 F (37 C) (Oral)  07/10/19 (!) 97.2 F (36.2 C)   BP Readings from Last 3 Encounters:  09/25/19 (!) 219/101  09/03/19 140/80  07/08/19 (!) 180/102   Pulse Readings from Last 3 Encounters:  09/25/19 87  09/03/19 87  07/08/19 78   Physical Exam Constitutional:      General: She is not in acute distress.    Appearance: Normal appearance. She is well-developed. She is not ill-appearing, toxic-appearing or diaphoretic.  HENT:     Head: Normocephalic and atraumatic.     Nose: Nose normal.     Mouth/Throat:     Mouth: Mucous membranes are moist.  Eyes:     General: No scleral icterus.       Right eye: No discharge.        Left eye: No discharge.      Extraocular Movements: Extraocular movements intact.     Conjunctiva/sclera: Conjunctivae normal.     Pupils: Pupils are equal, round, and reactive to light.  Cardiovascular:     Rate and Rhythm: Normal rate and regular rhythm.     Pulses: Normal pulses.     Heart sounds: Normal heart sounds. No murmur heard.  No friction rub. No gallop.   Pulmonary:     Effort: Pulmonary effort is normal. No respiratory distress.     Breath sounds: Normal breath sounds. No stridor. No  wheezing, rhonchi or rales.  Musculoskeletal:        General: Normal range of motion.     Right lower leg: No edema.     Left lower leg: No edema.     Comments: Strength 5/5 for upper and lower extremities.  Full range of motion.  Skin:    General: Skin is warm and dry.     Findings: No rash.  Neurological:     Mental Status: She is alert and oriented to person, place, and time.     Cranial Nerves: No cranial nerve deficit.     Motor: No weakness.     Coordination: Coordination normal.     Gait: Gait normal.     Deep Tendon Reflexes: Reflexes normal.     Comments: Negative Romberg and pronator drift.  Speech is intact.  Memory is intact.  Psychiatric:        Mood and Affect: Mood normal.        Behavior: Behavior normal.        Thought Content: Thought content normal.        Judgment: Judgment normal.      Assessment and Plan :   PDMP not reviewed this encounter.  1. Essential hypertension   2. Elevated blood pressure reading     Patient has significantly elevated blood pressure readings but no signs of an acute process and is asymptomatic.  She has had a recent change in her blood pressure medications and I recommended continuing with losartan as was prescribed, furosemide as well.  We will restart her amlodipine at 5 mg.  Maintain strict ER precautions.  Aloe up with PCP ASAP. Counseled patient on potential for adverse effects with medications prescribed/recommended today, ER and return-to-clinic  precautions discussed, patient verbalized understanding.    Wallis Bamberg, PA-C 09/25/19 1133

## 2019-09-25 NOTE — ED Notes (Addendum)
Pt states her BP was "295M"  systollic then 841L at home. Denies slurred/changed speech, HA, changes to vision, dizziness, extremity weakness. Pt alert, smile symmetrical, PERRLA, grips equal/strong, legs equal/strong, speech clear.  Pt states her SBP usually around 112. States she "did jumping jacks hoping to lower my BP". Pt has been concerned about large lump/hardness to lateral left lower leg. Pt denies CP, jaw/left arm/back pain or SOB. Per Rondel Oh, NP pt may return to waiting room and be seen according to time of arrival.

## 2019-09-25 NOTE — ED Triage Notes (Addendum)
Pt here with complaints of high blood pressure readings at home. Pt reports recent change in BP meds

## 2019-09-25 NOTE — Discharge Instructions (Addendum)
I am switching you back to amlodipine. Keep taking losartan as prescribed by your PCP and make an appointment for urgent follow up. If you develop chest pain, confusion, severe headache, belly pain, bloody urine then please report to the emergency by EMS or a calm person transporting you there.   For diabetes or elevated blood sugar, please make sure you are avoiding starchy, carbohydrate foods like pasta, breads, pastry, rice, potatoes, desserts. These foods can elevated your blood sugar. Also, avoid sodas, sweet teas, sugary beverages, fruit juices.  Drinking plain water will be much more helpful, try 64 ounces of water daily.  It is okay to flavor your water naturally by cutting cucumber, lemon, mint or lime, placing it in a picture with water and drinking it over a period of 2 to 3 days as long as it remains refrigerated.    For elevated blood pressure, make sure you are monitoring salt in your diet.  Do not eat restaurant foods and limit processed foods at home, prepare/cook your own foods at home.  Processed foods include things like frozen meals preseasoned meats and dinners, deli meats, canned foods as they are high in sodium/salt.  Make sure your pain attention to sodium labels on foods you by at the grocery store.  For seasoning you can use a brand called Mrs. Dash which includes a lot of salt free seasonings.  Salads - kale, spinach, cabbage, spring mix; use seeds like pumpkin seeds or sunflower seeds, almonds, walnuts or pecans; you can also use 1-2 hard boiled eggs in your salads Fruits - avocadoes, berries (blueberries, raspberries, blackberries), apples, oranges, pomegranate, pear; avoid eating bananas, grapes regularly Vegetables - aspargus, cauliflower, broccoli, green beans, brussel spouts, bell peppers; stay away from starchy vegetables like potatoes, carrots, peas  Regarding meat it is better to eat lean meats and limit your red meat including pork to once a week.  Wild caught fish,  chicken breast are good options as they tend to be leaner sources of good protein.   DO NOT EAT ANY FOODS ON THIS LIST THAT YOU ARE ALLERGIC TO.

## 2019-09-28 ENCOUNTER — Ambulatory Visit: Payer: Medicare Other | Admitting: Internal Medicine

## 2019-09-29 ENCOUNTER — Other Ambulatory Visit: Payer: Self-pay

## 2019-09-29 ENCOUNTER — Ambulatory Visit (INDEPENDENT_AMBULATORY_CARE_PROVIDER_SITE_OTHER): Payer: Medicare Other | Admitting: Internal Medicine

## 2019-09-29 ENCOUNTER — Encounter: Payer: Self-pay | Admitting: Internal Medicine

## 2019-09-29 VITALS — BP 160/90 | HR 84 | Temp 98.5°F | Ht 59.0 in | Wt 185.0 lb

## 2019-09-29 DIAGNOSIS — I10 Essential (primary) hypertension: Secondary | ICD-10-CM | POA: Diagnosis not present

## 2019-09-29 DIAGNOSIS — E119 Type 2 diabetes mellitus without complications: Secondary | ICD-10-CM | POA: Diagnosis not present

## 2019-09-29 DIAGNOSIS — E785 Hyperlipidemia, unspecified: Secondary | ICD-10-CM | POA: Diagnosis not present

## 2019-09-29 DIAGNOSIS — R609 Edema, unspecified: Secondary | ICD-10-CM

## 2019-09-29 MED ORDER — AMLODIPINE BESYLATE 10 MG PO TABS: 10.0000 mg | ORAL_TABLET | Freq: Every day | ORAL | 3 refills | Status: DC

## 2019-09-29 NOTE — Progress Notes (Signed)
Subjective:    Patient ID: Brandi Shaffer, female    DOB: 07-Aug-1945, 74 y.o.   MRN: [id number]  HPI  Here to f/u; overall doing ok,  Pt denies chest pain, increasing sob or doe, wheezing, orthopnea, PND, increased LE swelling, palpitations, dizziness or syncope.  Pt denies new neurological symptoms such as new headache, or facial or extremity weakness or numbness.  Pt denies polydipsia, polyuria, or low sugar episode.  Pt states overall good compliance with meds, mostly trying to follow appropriate diet, with wt overall stable,  Has been really diligent about diet and trying to lose wt to help the BP.  BP Readings from Last 3 Encounters:  09/29/19 (!) 160/90  09/25/19 (!) 219/101  09/03/19 140/80   Wt Readings from Last 3 Encounters:  09/29/19 185 lb (83.9 kg)  09/03/19 194 lb (88 kg)  07/08/19 194 lb (88 kg)  Recently seen at [facility] and started 5 mg amlodipine.   Past Medical History:  Diagnosis Date  . ANEMIA-IRON DEFICIENCY 12/06/2009  . Anxiety state 07/01/2015  . Asthma 12/05/2011  . DIABETES MELLITUS, TYPE II 09/17/2006  . DIVERTICULOSIS, COLON 12/06/2009  . GENITAL HERPES 03/24/2010  . GERD (gastroesophageal reflux disease) 06/28/2011  . HYPERLIPIDEMIA 03/24/2010  . HYPERTENSION 09/17/2006  . HYPOTHYROIDISM 09/17/2006  . LIPOMA 12/06/2009  . PERIPHERAL EDEMA 03/24/2010  . Renal cyst 07/05/2011   1.9 cm minimally complex   Past Surgical History:  Procedure Laterality Date  . ABDOMINAL HYSTERECTOMY     fibroids  . ankle surgury     x 3 left - chronic pain/swelling  . SHOULDER SURGERY Left   . TONSILLECTOMY    . TUBAL LIGATION      reports that she has never smoked. She has never used smokeless tobacco. She reports that she does not drink alcohol and does not use drugs. family history includes Diabetes in an other family member; Heart disease in her sister; Hypertension in her father and mother; Joint hypermobility in an other family member; Prostate cancer in her brother; Stroke in  her sister. Allergies  Allergen Reactions  . Januvia [Sitagliptin Phosphate]     dizziness  . Metformin And Related Nausea Only  . Doxycycline Rash   Current Outpatient Medications on File Prior to Visit  Medication Sig Dispense Refill  . albuterol (PROAIR HFA) 108 (90 Base) MCG/ACT inhaler TAKE 2 PUFFS BY MOUTH EVERY 6 HOURS AS NEEDED FOR WHEEZE 8.5 g 5  . aspirin 81 MG EC tablet TAKE 1 TABLET BY MOUTH DAILY. SWALLOW WHOLE. 30 tablet 11  . atorvastatin (LIPITOR) 40 MG tablet Take 1 tablet (40 mg total) by mouth daily. 90 tablet 3  . Cholecalciferol (VITAMIN D) 400 UNITS tablet Take 400 Units by mouth daily.      . clotrimazole-betamethasone (LOTRISONE) cream Apply to both feet and between toes bid x 4 weeks. 45 g 1  . COVID-19 mRNA vaccine, Pfizer, 30 MCG/0.3ML SUSP Inject into the muscle.    . furosemide (LASIX) 20 MG tablet TAKE 1 TABLET BY MOUTH EVERY DAY 90 tablet 3  . ibuprofen (ADVIL,MOTRIN) 400 MG tablet TAKE 1 TABLET (400 MG TOTAL) BY MOUTH EVERY 6 (SIX) HOURS AS NEEDED FOR PAIN. 60 tablet 5  . ipratropium-albuterol (DUONEB) 0.5-2.5 (3) MG/3ML SOLN USE 1 VIAL VIA NEBULIZER EVERY 6 HOURS AS NEEDED 360 mL 2  . losartan (COZAAR) 50 MG tablet Take 1 tablet (50 mg total) by mouth daily. 90 tablet 3  . Misc Natural Products (OSTEO BI-FLEX  TRIPLE STRENGTH PO) Take by mouth.    . Multiple Vitamins-Minerals (WOMENS MULTIVITAMIN PO) Take 1 tablet by mouth daily.    Letta Pate DELICA LANCETS 33G MISC 1 each by Does not apply route 2 (two) times daily. Use to help check blood sugars twice a day Dx E11.9 200 each 11  . ONETOUCH VERIO test strip USE 2 (TWO) TIMES DAILY. DX E11.9 100 strip 4  . pioglitazone (ACTOS) 15 MG tablet Take 15 mg by mouth daily.    . potassium chloride (KLOR-CON) 10 MEQ tablet Take 1 tablet (10 mEq total) by mouth daily. 90 tablet 2   No current facility-administered medications on file prior to visit.   Review of Systems All otherwise neg per pt     Objective:     Physical Exam BP (!) 160/90 (BP Location: Left Arm, Patient Position: Sitting, Cuff Size: Large)   Pulse 84   Temp 98.5 F (36.9 C) (Oral)   Ht 4\' 11"  (1.499 m)   Wt 185 lb (83.9 kg)   SpO2 99%   BMI 37.37 kg/m  VS noted,  Constitutional: Pt appears in NAD HENT: Head: NCAT.  Right Ear: External ear normal.  Left Ear: External ear normal.  Eyes: . Pupils are equal, round, and reactive to light. Conjunctivae and EOM are normal Nose: without d/c or deformity Neck: Neck supple. Gross normal ROM Cardiovascular: Normal rate and regular rhythm.   Pulmonary/Chest: Effort normal and breath sounds without rales or wheezing.  Abd:  Soft, NT, ND, + BS, no organomegaly Neurological: Pt is alert. At baseline orientation, motor grossly intact Skin: Skin is warm. No rashes, other new lesions, no LE edema Psychiatric: Pt behavior is normal without agitation  All otherwise neg per pt Lab Results  Component Value Date   WBC 6.0 07/08/2019   HGB 13.4 07/08/2019   HCT 40.1 07/08/2019   PLT 287.0 07/08/2019   GLUCOSE 92 07/08/2019   CHOL 196 07/08/2019   TRIG 66.0 07/08/2019   HDL 54.10 07/08/2019   LDLDIRECT 134.6 05/23/2010   LDLCALC 129 (H) 07/08/2019   ALT 12 07/08/2019   AST 18 07/08/2019   NA 142 07/08/2019   K 4.3 07/08/2019   CL 106 07/08/2019   CREATININE 0.93 07/08/2019   BUN 18 07/08/2019   CO2 29 07/08/2019   TSH 1.52 07/08/2019   HGBA1C 5.9 07/08/2019   MICROALBUR <0.7 07/08/2019       Assessment & Plan:

## 2019-09-29 NOTE — Patient Instructions (Addendum)
Ok to increase the amlodipine to 10 mg per day  Please take OTC Vitamin D3 at 2000 units per day, indefinitely.  You will be contacted regarding the referral for: ultrasound to check the arteries to the kidneys  Please continue all other medications as before, and refills have been done if requested.  Please have the pharmacy call with any other refills you may need.  Please continue your efforts at being more active, low cholesterol diet, and weight control.  Please keep your appointments with your specialists as you may have planned

## 2019-09-30 ENCOUNTER — Encounter: Payer: Self-pay | Admitting: Internal Medicine

## 2019-09-30 NOTE — Assessment & Plan Note (Signed)
stable overall by history and exam, recent data reviewed with pt, and pt to continue medical treatment as before,  to f/u any worsening symptoms or concerns  

## 2019-09-30 NOTE — Assessment & Plan Note (Addendum)
Uncontrolled, to incresae the amlodipine to 10 qd, check renal artery u/s, o/w stable overall by history and exam, recent data reviewed with pt, and pt to continue medical treatment as before,  to f/u any worsening symptoms or concerns, cont to monitor at home  I spent 31 minutes in preparing to see the patient by review of recent labs, imaging and procedures, obtaining and reviewing separately obtained history, communicating with the patient and family or caregiver, ordering medications, tests or procedures, and documenting clinical information in the EHR including the differential Dx, treatment, and any further evaluation and other management of htn, hld, edema, DM

## 2019-10-08 ENCOUNTER — Other Ambulatory Visit: Payer: Medicare Other

## 2019-10-12 ENCOUNTER — Encounter: Payer: Self-pay | Admitting: Podiatry

## 2019-10-12 ENCOUNTER — Ambulatory Visit (INDEPENDENT_AMBULATORY_CARE_PROVIDER_SITE_OTHER): Payer: Medicare Other | Admitting: Podiatry

## 2019-10-12 ENCOUNTER — Other Ambulatory Visit: Payer: Self-pay

## 2019-10-12 DIAGNOSIS — M79675 Pain in left toe(s): Secondary | ICD-10-CM | POA: Diagnosis not present

## 2019-10-12 DIAGNOSIS — B351 Tinea unguium: Secondary | ICD-10-CM | POA: Diagnosis not present

## 2019-10-12 DIAGNOSIS — M79674 Pain in right toe(s): Secondary | ICD-10-CM

## 2019-10-12 DIAGNOSIS — E119 Type 2 diabetes mellitus without complications: Secondary | ICD-10-CM

## 2019-10-12 NOTE — Progress Notes (Signed)
Subjective: Brandi Shaffer presents today preventative diabetic foot care and painful mycotic nails b/l that are difficult to trim. Pain interferes with ambulation. Aggravating factors include wearing enclosed shoe gear. Pain is relieved with periodic professional debridement.  She voices no new pedal concerns on today's visit.  Brandi Borg, MD is patient's PCP. Last visit was: 09/03/2019.  Past Medical History:  Diagnosis Date  . ANEMIA-IRON DEFICIENCY 12/06/2009  . Anxiety state 07/01/2015  . Asthma 12/05/2011  . DIABETES MELLITUS, TYPE II 09/17/2006  . DIVERTICULOSIS, COLON 12/06/2009  . GENITAL HERPES 03/24/2010  . GERD (gastroesophageal reflux disease) 06/28/2011  . HYPERLIPIDEMIA 03/24/2010  . HYPERTENSION 09/17/2006  . HYPOTHYROIDISM 09/17/2006  . LIPOMA 12/06/2009  . PERIPHERAL EDEMA 03/24/2010  . Renal cyst 07/05/2011   1.9 cm minimally complex     Current Outpatient Medications on File Prior to Visit  Medication Sig Dispense Refill  . albuterol (PROAIR HFA) 108 (90 Base) MCG/ACT inhaler TAKE 2 PUFFS BY MOUTH EVERY 6 HOURS AS NEEDED FOR WHEEZE 8.5 g 5  . amLODipine (NORVASC) 10 MG tablet Take 1 tablet (10 mg total) by mouth daily. 90 tablet 3  . aspirin 81 MG EC tablet TAKE 1 TABLET BY MOUTH DAILY. SWALLOW WHOLE. 30 tablet 11  . atorvastatin (LIPITOR) 40 MG tablet Take 1 tablet (40 mg total) by mouth daily. 90 tablet 3  . Cholecalciferol (VITAMIN D) 400 UNITS tablet Take 400 Units by mouth daily.      . clotrimazole-betamethasone (LOTRISONE) cream Apply to both feet and between toes bid x 4 weeks. 45 g 1  . COVID-19 mRNA vaccine, Pfizer, 30 MCG/0.3ML SUSP Inject into the muscle.    . furosemide (LASIX) 20 MG tablet TAKE 1 TABLET BY MOUTH EVERY DAY 90 tablet 3  . ipratropium-albuterol (DUONEB) 0.5-2.5 (3) MG/3ML SOLN USE 1 VIAL VIA NEBULIZER EVERY 6 HOURS AS NEEDED 360 mL 2  . losartan (COZAAR) 50 MG tablet Take 1 tablet (50 mg total) by mouth daily. 90 tablet 3  . Misc Natural  Products (OSTEO BI-FLEX TRIPLE STRENGTH PO) Take by mouth.    . Multiple Vitamins-Minerals (WOMENS MULTIVITAMIN PO) Take 1 tablet by mouth daily.    Glory Rosebush DELICA LANCETS 09O MISC 1 each by Does not apply route 2 (two) times daily. Use to help check blood sugars twice a day Dx E11.9 200 each 11  . ONETOUCH VERIO test strip USE 2 (TWO) TIMES DAILY. DX E11.9 100 strip 4  . pioglitazone (ACTOS) 15 MG tablet Take 15 mg by mouth daily.    . potassium chloride (KLOR-CON) 10 MEQ tablet Take 1 tablet (10 mEq total) by mouth daily. 90 tablet 2   No current facility-administered medications on file prior to visit.     Allergies  Allergen Reactions  . Januvia [Sitagliptin Phosphate]     dizziness  . Metformin And Related Nausea Only  . Doxycycline Rash    Objective: Brandi Shaffer is a pleasant 74 y.o. African American female in NAD.   There were no vitals filed for this visit.  Vascular Examination: Neurovascular status unchanged b/l. Capillary refill time to digits immediate b/l. Palpable DP pulses b/l. Palpable PT pulses b/l. Pedal hair present b/l. Skin temperature gradient within normal limits b/l.  Dermatological Examination: Pedal skin with normal turgor, texture and tone bilaterally. No open wounds bilaterally. No interdigital macerations bilaterally. Toenails 1-5 right, L 2nd toe, L 3rd toe and L 4th toe elongated, discolored, dystrophic, thickened, and crumbly with subungual debris  and tenderness to dorsal palpation. Anonychia noted L hallux and L 5th toe. Nailbed(s) epithelialized.   Musculoskeletal: Normal muscle strength 5/5 to all lower extremity muscle groups bilaterally. No gross bony deformities bilaterally. No pain crepitus or joint limitation noted with ROM b/l. Limited joint ROM to the left ankle.  Neurological Examination: Protective sensation intact 5/5 intact bilaterally with 10g monofilament b/l. Vibratory sensation intact b/l.  Assessment: 1. Pain due to  onychomycosis of toenails of both feet   2. Controlled type 2 diabetes mellitus without complication, without long-term current use of insulin ([place])      Plan: -Examined patient. -Continue diabetic foot care principles. -Toenails 1-5 right, L 2nd toe, L 3rd toe and L 4th toe debrided in length and girth without iatrogenic bleeding with sterile nail nipper and dremel.  -Patient to report any pedal injuries to medical professional immediately. -Patient to continue soft, supportive shoe gear daily. -Patient/POA to call should there be question/concern in the interim.  Return in about 3 months (around 01/12/2020) for diabetic nail trim.  Marzetta Board, DPM

## 2019-10-19 ENCOUNTER — Other Ambulatory Visit: Payer: Medicare Other

## 2019-10-20 ENCOUNTER — Ambulatory Visit
Admission: RE | Admit: 2019-10-20 | Discharge: 2019-10-20 | Disposition: A | Discharge status: Home / Self Care | Payer: Medicare Other | Source: Ambulatory Visit | Attending: Internal Medicine | Admitting: Internal Medicine

## 2019-10-20 DIAGNOSIS — I10 Essential (primary) hypertension: Secondary | ICD-10-CM

## 2019-10-20 DIAGNOSIS — N281 Cyst of kidney, acquired: Secondary | ICD-10-CM | POA: Diagnosis not present

## 2019-10-22 ENCOUNTER — Encounter: Payer: Self-pay | Admitting: Internal Medicine

## 2019-10-22 ENCOUNTER — Other Ambulatory Visit: Payer: Self-pay | Admitting: Internal Medicine

## 2019-10-25 ENCOUNTER — Other Ambulatory Visit: Payer: Self-pay | Admitting: Internal Medicine

## 2019-10-25 NOTE — Telephone Encounter (Signed)
Please refill as per office routine med refill policy (all routine meds refilled for 3 mo or monthly per pt preference up to one year from last visit, then month to month grace period for 3 mo, then further med refills will have to be denied)  

## 2019-11-18 ENCOUNTER — Encounter: Payer: Self-pay | Admitting: Gastroenterology

## 2019-12-28 ENCOUNTER — Telehealth: Payer: Self-pay | Admitting: Internal Medicine

## 2019-12-28 NOTE — Telephone Encounter (Signed)
amLODipine (NORVASC) 10 MG tablet  [id number] - [place], [place] - [address] AT [address] Phone:  [phone]  Fax:  [phone]     Patient needs a refill

## 2019-12-29 ENCOUNTER — Other Ambulatory Visit: Payer: Self-pay

## 2019-12-29 MED ORDER — AMLODIPINE BESYLATE 10 MG PO TABS
10.0000 mg | ORAL_TABLET | Freq: Every day | ORAL | 3 refills | Status: DC
Start: 2019-12-29 — End: 2021-03-07

## 2020-01-04 ENCOUNTER — Other Ambulatory Visit (INDEPENDENT_AMBULATORY_CARE_PROVIDER_SITE_OTHER): Payer: Medicare Other

## 2020-01-04 DIAGNOSIS — E119 Type 2 diabetes mellitus without complications: Secondary | ICD-10-CM | POA: Diagnosis not present

## 2020-01-04 DIAGNOSIS — E611 Iron deficiency: Secondary | ICD-10-CM

## 2020-01-04 DIAGNOSIS — E559 Vitamin D deficiency, unspecified: Secondary | ICD-10-CM

## 2020-01-04 DIAGNOSIS — I10 Essential (primary) hypertension: Secondary | ICD-10-CM

## 2020-01-04 DIAGNOSIS — E538 Deficiency of other specified B group vitamins: Secondary | ICD-10-CM

## 2020-01-04 DIAGNOSIS — E785 Hyperlipidemia, unspecified: Secondary | ICD-10-CM | POA: Diagnosis not present

## 2020-01-04 LAB — BASIC METABOLIC PANEL
BUN: 18 mg/dL (ref 6–23)
CO2: 29 mEq/L (ref 19–32)
Calcium: 9 mg/dL (ref 8.4–10.5)
Chloride: 108 mEq/L (ref 96–112)
Creatinine, Ser: 0.96 mg/dL (ref 0.40–1.20)
GFR: 58.06 mL/min — ABNORMAL LOW (ref >60.00)
Glucose, Bld: 83 mg/dL (ref 70–99)
Potassium: 4.2 mEq/L (ref 3.5–5.1)
Sodium: 143 mEq/L (ref 135–145)

## 2020-01-04 LAB — CBC WITH DIFFERENTIAL/PLATELET
Basophils Absolute: 0.1 10*3/uL (ref 0.0–0.1)
Basophils Relative: 0.9 % (ref 0.0–3.0)
Eosinophils Absolute: 0.3 10*3/uL (ref 0.0–0.7)
Eosinophils Relative: 5.1 % — ABNORMAL HIGH (ref 0.0–5.0)
HCT: 39.3 % (ref 36.0–46.0)
Hemoglobin: 13.2 g/dL (ref 12.0–15.0)
Lymphocytes Relative: 45.3 % (ref 12.0–46.0)
Lymphs Abs: 2.5 10*3/uL (ref 0.7–4.0)
MCHC: 33.5 g/dL (ref 30.0–36.0)
MCV: 91.7 fl (ref 78.0–100.0)
Monocytes Absolute: 0.6 10*3/uL (ref 0.1–1.0)
Monocytes Relative: 10.3 % (ref 3.0–12.0)
Neutro Abs: 2.1 10*3/uL (ref 1.4–7.7)
Neutrophils Relative %: 38.4 % — ABNORMAL LOW (ref 43.0–77.0)
Platelets: 258 10*3/uL (ref 150.0–400.0)
RBC: 4.28 Mil/uL (ref 3.87–5.11)
RDW: 15 % (ref 11.5–15.5)
WBC: 5.5 10*3/uL (ref 4.0–10.5)

## 2020-01-04 LAB — HEPATIC FUNCTION PANEL
ALT: 11 U/L (ref 0–35)
AST: 17 U/L (ref 0–37)
Albumin: 4.2 g/dL (ref 3.5–5.2)
Alkaline Phosphatase: 107 U/L (ref 39–117)
Bilirubin, Direct: 0.1 mg/dL (ref 0.0–0.3)
Total Bilirubin: 0.5 mg/dL (ref 0.2–1.2)
Total Protein: 6.7 g/dL (ref 6.0–8.3)

## 2020-01-04 LAB — URINALYSIS, ROUTINE W REFLEX MICROSCOPIC
Bilirubin Urine: NEGATIVE
Hgb urine dipstick: NEGATIVE
Ketones, ur: NEGATIVE
Nitrite: NEGATIVE
RBC / HPF: NONE SEEN (ref 0–?)
Specific Gravity, Urine: 1.025 (ref 1.000–1.030)
Total Protein, Urine: NEGATIVE
Urine Glucose: NEGATIVE
Urobilinogen, UA: 0.2 (ref 0.0–1.0)
pH: 6.5 (ref 5.0–8.0)

## 2020-01-04 LAB — HEMOGLOBIN A1C: Hgb A1c MFr Bld: 5.9 % (ref 4.6–6.5)

## 2020-01-04 LAB — LIPID PANEL
Cholesterol: 176 mg/dL (ref 0–200)
HDL: 58.6 mg/dL (ref >39.00)
LDL Cholesterol: 102 mg/dL — ABNORMAL HIGH (ref 0–99)
NonHDL: 117.45
Total CHOL/HDL Ratio: 3
Triglycerides: 76 mg/dL (ref 0.0–149.0)
VLDL: 15.2 mg/dL (ref 0.0–40.0)

## 2020-01-04 LAB — MICROALBUMIN / CREATININE URINE RATIO
Creatinine,U: 137.5 mg/dL
Microalb Creat Ratio: 0.8 mg/g (ref 0.0–30.0)
Microalb, Ur: 1 mg/dL (ref 0.0–1.9)

## 2020-01-04 LAB — TSH: TSH: 2.14 u[IU]/mL (ref 0.35–4.50)

## 2020-01-04 LAB — VITAMIN B12: Vitamin B-12: 974 pg/mL — ABNORMAL HIGH (ref 211–911)

## 2020-01-04 LAB — VITAMIN D 25 HYDROXY (VIT D DEFICIENCY, FRACTURES): VITD: 18.53 ng/mL — ABNORMAL LOW (ref 30.00–100.00)

## 2020-01-08 ENCOUNTER — Ambulatory Visit: Payer: Medicare Other | Admitting: Internal Medicine

## 2020-01-11 ENCOUNTER — Ambulatory Visit (INDEPENDENT_AMBULATORY_CARE_PROVIDER_SITE_OTHER): Payer: Medicare Other | Admitting: Internal Medicine

## 2020-01-11 ENCOUNTER — Other Ambulatory Visit: Payer: Self-pay | Admitting: Internal Medicine

## 2020-01-11 ENCOUNTER — Other Ambulatory Visit: Payer: Self-pay

## 2020-01-11 ENCOUNTER — Encounter: Payer: Self-pay | Admitting: Internal Medicine

## 2020-01-11 VITALS — BP 112/67 | HR 105 | Temp 98.1°F | Ht 59.0 in | Wt 185.0 lb

## 2020-01-11 DIAGNOSIS — R350 Frequency of micturition: Secondary | ICD-10-CM | POA: Diagnosis not present

## 2020-01-11 DIAGNOSIS — E1165 Type 2 diabetes mellitus with hyperglycemia: Secondary | ICD-10-CM

## 2020-01-11 DIAGNOSIS — R102 Pelvic and perineal pain: Secondary | ICD-10-CM | POA: Diagnosis not present

## 2020-01-11 DIAGNOSIS — E559 Vitamin D deficiency, unspecified: Secondary | ICD-10-CM | POA: Insufficient documentation

## 2020-01-11 DIAGNOSIS — I10 Essential (primary) hypertension: Secondary | ICD-10-CM

## 2020-01-11 DIAGNOSIS — E538 Deficiency of other specified B group vitamins: Secondary | ICD-10-CM

## 2020-01-11 DIAGNOSIS — Z0001 Encounter for general adult medical examination with abnormal findings: Secondary | ICD-10-CM

## 2020-01-11 DIAGNOSIS — Z1211 Encounter for screening for malignant neoplasm of colon: Secondary | ICD-10-CM

## 2020-01-11 DIAGNOSIS — Z0000 Encounter for general adult medical examination without abnormal findings: Secondary | ICD-10-CM | POA: Diagnosis not present

## 2020-01-11 HISTORY — DX: Frequency of micturition: R35.0

## 2020-01-11 LAB — URINALYSIS, ROUTINE W REFLEX MICROSCOPIC
Bilirubin Urine: NEGATIVE
Hgb urine dipstick: NEGATIVE
Ketones, ur: NEGATIVE
Leukocytes,Ua: NEGATIVE
Nitrite: NEGATIVE
Specific Gravity, Urine: 1.025 (ref 1.000–1.030)
Total Protein, Urine: NEGATIVE
Urine Glucose: NEGATIVE
Urobilinogen, UA: 0.2 (ref 0.0–1.0)
pH: 6 (ref 5.0–8.0)

## 2020-01-11 NOTE — Patient Instructions (Addendum)
You had the flu shot today  You will be contacted regarding the referral for: colonoscopy  Ok to continue the Vit D3 at 4000 u per day, and the B12 as you are  Please continue all other medications as before, and refills have been done if requested.  Please have the pharmacy call with any other refills you may need.  Please continue your efforts at being more active, low cholesterol diet, and weight control.  You are otherwise up to date with prevention measures today.  You will be contacted regarding the referral for: GYN  Please keep your appointments with your specialists as you may have planned  Please go to the LAB at the blood drawing area for the tests to be done - just the urine testing today, but if the testing is negative you may want to try a medication for Overactive bladder  You will be contacted by phone if any changes need to be made immediately.  Otherwise, you will receive a letter about your results with an explanation, but please check with MyChart first.  Please remember to sign up for MyChart if you have not done so, as this will be important to you in the future with finding out test results, communicating by private email, and scheduling acute appointments online when needed.  Please make an Appointment to return in 6 months, or sooner if needed, also with Lab Appointment for testing done 3-5 days before at the [facility] (so this is for TWO appointments - please see the scheduling desk as you leave)

## 2020-01-11 NOTE — Progress Notes (Signed)
Subjective:    Patient ID: Brandi Shaffer, female    DOB: Aug 19, 1945, 74 y.o.   MRN: [id number]  HPI  Here for wellness and f/u;  Overall doing ok;  Pt denies Chest pain, worsening SOB, DOE, wheezing, orthopnea, PND, worsening LE edema, palpitations, dizziness or syncope.  Pt denies neurological change such as new headache, facial or extremity weakness.  Pt denies polydipsia, polyuria, or low sugar symptoms. Pt states overall good compliance with treatment and medications, good tolerability, and has been trying to follow appropriate diet.  Pt denies worsening depressive symptoms, suicidal ideation or panic. No fever, night sweats, wt loss, loss of appetite, or other constitutional symptoms.  Pt states good ability with ADL's, has low fall risk, home safety reviewed and adequate, no other significant changes in hearing or vision, and only occasionally active with exercise. Due for flu shot, colonoscopy Also Sometimes misses her lasix as she tends to be up all night even if takes in the AM., seems worse when she takes the losartan it seems, but has not been tx for OAB in psat. Lost wt with better diet.  Wt Readings from Last 3 Encounters:  01/11/20 185 lb (83.9 kg)  09/29/19 185 lb (83.9 kg)  09/03/19 194 lb (88 kg)  BP better at home, 112/67.  Denies urinary symptoms such as dysuria, urgency, flank pain, hematuria or n/v, fever, chills, but has worsening urinary frequency in the past wk.  Taking vit d 2000 qd.  Also has ongoing . 1 yr recurring pain to right pelvic area, Denies worsening reflux, abd pain, dysphagia, n/v, bowel change or blood.   Past Medical History:  Diagnosis Date  . ANEMIA-IRON DEFICIENCY 12/06/2009  . Anxiety state 07/01/2015  . Asthma 12/05/2011  . DIABETES MELLITUS, TYPE II 09/17/2006  . DIVERTICULOSIS, COLON 12/06/2009  . GENITAL HERPES 03/24/2010  . GERD (gastroesophageal reflux disease) 06/28/2011  . HYPERLIPIDEMIA 03/24/2010  . HYPERTENSION 09/17/2006  . HYPOTHYROIDISM  09/17/2006  . LIPOMA 12/06/2009  . PERIPHERAL EDEMA 03/24/2010  . Renal cyst 07/05/2011   1.9 cm minimally complex   Past Surgical History:  Procedure Laterality Date  . ABDOMINAL HYSTERECTOMY     fibroids  . ankle surgury     x 3 left - chronic pain/swelling  . SHOULDER SURGERY Left   . TONSILLECTOMY    . TUBAL LIGATION      reports that she has never smoked. She has never used smokeless tobacco. She reports that she does not drink alcohol and does not use drugs. family history includes Diabetes in an other family member; Heart disease in her sister; Hypertension in her father and mother; Joint hypermobility in an other family member; Prostate cancer in her brother; Stroke in her sister. Allergies  Allergen Reactions  . Januvia [Sitagliptin Phosphate]     dizziness  . Metformin And Related Nausea Only  . Doxycycline Rash   Current Outpatient Medications on File Prior to Visit  Medication Sig Dispense Refill  . albuterol (VENTOLIN HFA) 108 (90 Base) MCG/ACT inhaler INHALE 2 PUFFS BY MOUTH EVERY 6 HOURS AS NEEDED FOR WHEEZE 8.5 g 5  . amLODipine (NORVASC) 10 MG tablet Take 1 tablet (10 mg total) by mouth daily. 90 tablet 3  . aspirin 81 MG EC tablet TAKE 1 TABLET BY MOUTH DAILY. SWALLOW WHOLE. 30 tablet 11  . atorvastatin (LIPITOR) 40 MG tablet Take 1 tablet (40 mg total) by mouth daily. 90 tablet 3  . Cholecalciferol (VITAMIN D) 400 UNITS tablet Take  400 Units by mouth daily.      . clotrimazole-betamethasone (LOTRISONE) cream Apply to both feet and between toes bid x 4 weeks. 45 g 1  . COVID-19 mRNA vaccine, Pfizer, 30 MCG/0.3ML SUSP Inject into the muscle.    . furosemide (LASIX) 20 MG tablet TAKE 1 TABLET BY MOUTH EVERY DAY 90 tablet 3  . ipratropium-albuterol (DUONEB) 0.5-2.5 (3) MG/3ML SOLN USE 1 VIAL VIA NEBULIZER EVERY 6 HOURS AS NEEDED 360 mL 2  . losartan (COZAAR) 50 MG tablet Take 1 tablet (50 mg total) by mouth daily. 90 tablet 3  . Misc Natural Products (OSTEO BI-FLEX  TRIPLE STRENGTH PO) Take by mouth.    . Multiple Vitamins-Minerals (WOMENS MULTIVITAMIN PO) Take 1 tablet by mouth daily.    Letta Pate DELICA LANCETS 33G MISC 1 each by Does not apply route 2 (two) times daily. Use to help check blood sugars twice a day Dx E11.9 200 each 11  . pioglitazone (ACTOS) 15 MG tablet TAKE 1 TABLET BY MOUTH EVERY DAY 90 tablet 1   No current facility-administered medications on file prior to visit.   Review of Systems All otherwise neg per pt    Objective:   Physical Exam BP (!) 150/80 (BP Location: Left Arm, Patient Position: Sitting, Cuff Size: Large)   Pulse (!) 105   Temp 98.1 F (36.7 C) (Oral)   Ht 4\' 11"  (1.499 m)   Wt 185 lb (83.9 kg)   SpO2 98%   BMI 37.37 kg/m  VS noted,  Constitutional: Pt appears in NAD HENT: Head: NCAT.  Right Ear: External ear normal.  Left Ear: External ear normal.  Eyes: . Pupils are equal, round, and reactive to light. Conjunctivae and EOM are normal Nose: without d/c or deformity Neck: Neck supple. Gross normal ROM Cardiovascular: Normal rate and regular rhythm.   Pulmonary/Chest: Effort normal and breath sounds without rales or wheezing.  Abd:  Soft, NT, ND, + BS, no organomegaly Neurological: Pt is alert. At baseline orientation, motor grossly intact Skin: Skin is warm. No rashes, other new lesions, no LE edema Psychiatric: Pt behavior is normal without agitation  All otherwise neg per pt Lab Results  Component Value Date   WBC 5.5 01/04/2020   HGB 13.2 01/04/2020   HCT 39.3 01/04/2020   PLT 258.0 01/04/2020   GLUCOSE 83 01/04/2020   CHOL 176 01/04/2020   TRIG 76.0 01/04/2020   HDL 58.60 01/04/2020   LDLDIRECT 134.6 05/23/2010   LDLCALC 102 (H) 01/04/2020   ALT 11 01/04/2020   AST 17 01/04/2020   NA 143 01/04/2020   K 4.2 01/04/2020   CL 108 01/04/2020   CREATININE 0.96 01/04/2020   BUN 18 01/04/2020   CO2 29 01/04/2020   TSH 2.14 01/04/2020   HGBA1C 5.9 01/04/2020   MICROALBUR 1.0 01/04/2020       Assessment & Plan:

## 2020-01-12 ENCOUNTER — Ambulatory Visit (INDEPENDENT_AMBULATORY_CARE_PROVIDER_SITE_OTHER): Payer: Medicare Other

## 2020-01-12 ENCOUNTER — Encounter: Payer: Self-pay | Admitting: Internal Medicine

## 2020-01-12 ENCOUNTER — Other Ambulatory Visit: Payer: Self-pay

## 2020-01-12 VITALS — Temp 98.1°F | Ht 59.0 in | Wt 185.0 lb

## 2020-01-12 DIAGNOSIS — Z0000 Encounter for general adult medical examination without abnormal findings: Secondary | ICD-10-CM | POA: Diagnosis not present

## 2020-01-12 LAB — URINE CULTURE: Result:: NO GROWTH

## 2020-01-12 NOTE — Patient Instructions (Addendum)
Brandi Shaffer , Thank you for taking time to come for your Medicare Wellness Visit. I appreciate your ongoing commitment to your health goals. Please review the following plan we discussed and let me know if I can assist you in the future.   Screening recommendations/referrals: Colonoscopy: 01/16/2010; due every 10 years (referral placed) Mammogram: 08/20/2018; (patient will call to schedule) Bone Density: 12/16/2015; due every 2 years (referral placed to ob/gyn) Recommended yearly ophthalmology/optometry visit for glaucoma screening and checkup Recommended yearly dental visit for hygiene and checkup  Vaccinations: Influenza vaccine: 01/11/2020 Pneumococcal vaccine: up to date Tdap vaccine: 06/08/2014 Shingles vaccine: never done Covid-19: up to date  Advanced directives: Advance directive discussed with you today. Even though you declined this today please call our office should you change your mind and we can give you the proper paperwork for you to fill out.  Conditions/risks identified: Yes; Reviewed health maintenance screenings with patient today and relevant education, vaccines, and/or referrals were provided. Please continue to do your personal lifestyle choices by: daily care of teeth and gums, regular physical activity (goal should be 5 days a week for 30 minutes), eat a healthy diet, avoid tobacco and drug use, limiting any alcohol intake, taking a low-dose aspirin (if not allergic or have been advised by your provider otherwise) and taking vitamins and minerals as recommended by your provider. Continue doing brain stimulating activities (puzzles, reading, adult coloring books, staying active) to keep memory sharp. Continue to eat heart healthy diet (full of fruits, vegetables, whole grains, lean protein, water--limit salt, fat, and sugar intake) and increase physical activity as tolerated.  Next appointment: Please schedule your next Medicare Wellness Visit with your Nurse Health Advisor  in 1 year by calling [phone].   Preventive Care 30 Years and Older, Female Preventive care refers to lifestyle choices and visits with your health care provider that can promote health and wellness. What does preventive care include?  A yearly physical exam. This is also called an annual well check.  Dental exams once or twice a year.  Routine eye exams. Ask your health care provider how often you should have your eyes checked.  Personal lifestyle choices, including:  Daily care of your teeth and gums.  Regular physical activity.  Eating a healthy diet.  Avoiding tobacco and drug use.  Limiting alcohol use.  Practicing safe sex.  Taking low-dose aspirin every day.  Taking vitamin and mineral supplements as recommended by your health care provider. What happens during an annual well check? The services and screenings done by your health care provider during your annual well check will depend on your age, overall health, lifestyle risk factors, and family history of disease. Counseling  Your health care provider may ask you questions about your:  Alcohol use.  Tobacco use.  Drug use.  Emotional well-being.  Home and relationship well-being.  Sexual activity.  Eating habits.  History of falls.  Memory and ability to understand (cognition).  Work and work Statistician.  Reproductive health. Screening  You may have the following tests or measurements:  Height, weight, and BMI.  Blood pressure.  Lipid and cholesterol levels. These may be checked every 5 years, or more frequently if you are over 34 years old.  Skin check.  Lung cancer screening. You may have this screening every year starting at age 61 if you have a 30-pack-year history of smoking and currently smoke or have quit within the past 15 years.  Fecal occult blood test (FOBT) of the stool.  You may have this test every year starting at age 75.  Flexible sigmoidoscopy or colonoscopy. You  may have a sigmoidoscopy every 5 years or a colonoscopy every 10 years starting at age 23.  Hepatitis C blood test.  Hepatitis B blood test.  Sexually transmitted disease (STD) testing.  Diabetes screening. This is done by checking your blood sugar (glucose) after you have not eaten for a while (fasting). You may have this done every 1-3 years.  Bone density scan. This is done to screen for osteoporosis. You may have this done starting at age 23.  Mammogram. This may be done every 1-2 years. Talk to your health care provider about how often you should have regular mammograms. Talk with your health care provider about your test results, treatment options, and if necessary, the need for more tests. Vaccines  Your health care provider may recommend certain vaccines, such as:  Influenza vaccine. This is recommended every year.  Tetanus, diphtheria, and acellular pertussis (Tdap, Td) vaccine. You may need a Td booster every 10 years.  Zoster vaccine. You may need this after age 1.  Pneumococcal 13-valent conjugate (PCV13) vaccine. One dose is recommended after age 66.  Pneumococcal polysaccharide (PPSV23) vaccine. One dose is recommended after age 28. Talk to your health care provider about which screenings and vaccines you need and how often you need them. This information is not intended to replace advice given to you by your health care provider. Make sure you discuss any questions you have with your health care provider. Document Released: 03/11/2015 Document Revised: 11/02/2015 Document Reviewed: 12/14/2014 Elsevier Interactive Patient Education  2017 [place] Prevention in the Home Falls can cause injuries. They can happen to people of all ages. There are many things you can do to make your home safe and to help prevent falls. What can I do on the outside of my home?  Regularly fix the edges of walkways and driveways and fix any cracks.  Remove anything that might  make you trip as you walk through a door, such as a raised step or threshold.  Trim any bushes or trees on the path to your home.  Use bright outdoor lighting.  Clear any walking paths of anything that might make someone trip, such as rocks or tools.  Regularly check to see if handrails are loose or broken. Make sure that both sides of any steps have handrails.  Any raised decks and porches should have guardrails on the edges.  Have any leaves, snow, or ice cleared regularly.  Use sand or salt on walking paths during winter.  Clean up any spills in your garage right away. This includes oil or grease spills. What can I do in the bathroom?  Use night lights.  Install grab bars by the toilet and in the tub and shower. Do not use towel bars as grab bars.  Use non-skid mats or decals in the tub or shower.  If you need to sit down in the shower, use a plastic, non-slip stool.  Keep the floor dry. Clean up any water that spills on the floor as soon as it happens.  Remove soap buildup in the tub or shower regularly.  Attach bath mats securely with double-sided non-slip rug tape.  Do not have throw rugs and other things on the floor that can make you trip. What can I do in the bedroom?  Use night lights.  Make sure that you have a light by your bed that  is easy to reach.  Do not use any sheets or blankets that are too big for your bed. They should not hang down onto the floor.  Have a firm chair that has side arms. You can use this for support while you get dressed.  Do not have throw rugs and other things on the floor that can make you trip. What can I do in the kitchen?  Clean up any spills right away.  Avoid walking on wet floors.  Keep items that you use a lot in easy-to-reach places.  If you need to reach something above you, use a strong step stool that has a grab bar.  Keep electrical cords out of the way.  Do not use floor polish or wax that makes floors  slippery. If you must use wax, use non-skid floor wax.  Do not have throw rugs and other things on the floor that can make you trip. What can I do with my stairs?  Do not leave any items on the stairs.  Make sure that there are handrails on both sides of the stairs and use them. Fix handrails that are broken or loose. Make sure that handrails are as long as the stairways.  Check any carpeting to make sure that it is firmly attached to the stairs. Fix any carpet that is loose or worn.  Avoid having throw rugs at the top or bottom of the stairs. If you do have throw rugs, attach them to the floor with carpet tape.  Make sure that you have a light switch at the top of the stairs and the bottom of the stairs. If you do not have them, ask someone to add them for you. What else can I do to help prevent falls?  Wear shoes that:  Do not have high heels.  Have rubber bottoms.  Are comfortable and fit you well.  Are closed at the toe. Do not wear sandals.  If you use a stepladder:  Make sure that it is fully opened. Do not climb a closed stepladder.  Make sure that both sides of the stepladder are locked into place.  Ask someone to hold it for you, if possible.  Clearly mark and make sure that you can see:  Any grab bars or handrails.  First and last steps.  Where the edge of each step is.  Use tools that help you move around (mobility aids) if they are needed. These include:  Canes.  Walkers.  Scooters.  Crutches.  Turn on the lights when you go into a dark area. Replace any light bulbs as soon as they burn out.  Set up your furniture so you have a clear path. Avoid moving your furniture around.  If any of your floors are uneven, fix them.  If there are any pets around you, be aware of where they are.  Review your medicines with your doctor. Some medicines can make you feel dizzy. This can increase your chance of falling. Ask your doctor what other things that you  can do to help prevent falls. This information is not intended to replace advice given to you by your health care provider. Make sure you discuss any questions you have with your health care provider. Document Released: 12/09/2008 Document Revised: 07/21/2015 Document Reviewed: 03/19/2014 Elsevier Interactive Patient Education  2017 [employer].

## 2020-01-12 NOTE — Progress Notes (Signed)
Subjective:   Brandi Shaffer is a 74 y.o. female who presents for Medicare Annual (Subsequent) preventive examination.  Review of Systems    No ROS. Medicare Wellness Visit. Additional risk factors are reflected in social history. Cardiac Risk Factors include: advanced age (>75men, >62 women);diabetes mellitus;dyslipidemia;hypertension;obesity (BMI >30kg/m2);family history of premature cardiovascular disease     Objective:    Today's Vitals   01/12/20 1258  Temp: 98.1 F (36.7 C)  SpO2: 98%  Weight: 185 lb (83.9 kg)  Height: 4\' 11"  (1.499 m)  PainSc: 0-No pain   Body mass index is 37.37 kg/m.  Advanced Directives 01/12/2020 01/06/2019 11/15/2017 11/14/2016 07/31/2015 06/30/2015 06/29/2014  Does Patient Have a Medical Advance Directive? No No No No No No No  Does patient want to make changes to medical advance directive? - - - Yes (ED - Information included in AVS) - - -  Would patient like information on creating a medical advance directive? No - Patient declined No - Patient declined Yes (ED - Information included in AVS) - No - patient declined information - No - patient declined information    Current Medications (verified) Outpatient Encounter Medications as of 01/12/2020  Medication Sig  . albuterol (VENTOLIN HFA) 108 (90 Base) MCG/ACT inhaler INHALE 2 PUFFS BY MOUTH EVERY 6 HOURS AS NEEDED FOR WHEEZE  . amLODipine (NORVASC) 10 MG tablet Take 1 tablet (10 mg total) by mouth daily.  Marland Kitchen aspirin 81 MG EC tablet TAKE 1 TABLET BY MOUTH DAILY. SWALLOW WHOLE.  Marland Kitchen atorvastatin (LIPITOR) 40 MG tablet Take 1 tablet (40 mg total) by mouth daily.  . Cholecalciferol (VITAMIN D) 400 UNITS tablet Take 400 Units by mouth daily.    . clotrimazole-betamethasone (LOTRISONE) cream Apply to both feet and between toes bid x 4 weeks.  Marland Kitchen COVID-19 mRNA vaccine, Pfizer, 30 MCG/0.3ML SUSP Inject into the muscle.  . furosemide (LASIX) 20 MG tablet TAKE 1 TABLET BY MOUTH EVERY DAY  .  ipratropium-albuterol (DUONEB) 0.5-2.5 (3) MG/3ML SOLN USE 1 VIAL VIA NEBULIZER EVERY 6 HOURS AS NEEDED  . losartan (COZAAR) 50 MG tablet Take 1 tablet (50 mg total) by mouth daily.  . Misc Natural Products (OSTEO BI-FLEX TRIPLE STRENGTH PO) Take by mouth.  . Multiple Vitamins-Minerals (WOMENS MULTIVITAMIN PO) Take 1 tablet by mouth daily.  Glory Rosebush DELICA LANCETS 18A MISC 1 each by Does not apply route 2 (two) times daily. Use to help check blood sugars twice a day Dx E11.9  . ONETOUCH VERIO test strip USE 2 (TWO) TIMES DAILY. DX E11.9  . pioglitazone (ACTOS) 15 MG tablet TAKE 1 TABLET BY MOUTH EVERY DAY  . potassium chloride (KLOR-CON) 10 MEQ tablet Take 1 tablet (10 mEq total) by mouth daily.   No facility-administered encounter medications on file as of 01/12/2020.    Allergies (verified) Januvia [sitagliptin phosphate], Metformin and related, and Doxycycline   History: Past Medical History:  Diagnosis Date  . ANEMIA-IRON DEFICIENCY 12/06/2009  . Anxiety state 07/01/2015  . Asthma 12/05/2011  . DIABETES MELLITUS, TYPE II 09/17/2006  . DIVERTICULOSIS, COLON 12/06/2009  . GENITAL HERPES 03/24/2010  . GERD (gastroesophageal reflux disease) 06/28/2011  . HYPERLIPIDEMIA 03/24/2010  . HYPERTENSION 09/17/2006  . HYPOTHYROIDISM 09/17/2006  . LIPOMA 12/06/2009  . PERIPHERAL EDEMA 03/24/2010  . Renal cyst 07/05/2011   1.9 cm minimally complex   Past Surgical History:  Procedure Laterality Date  . ABDOMINAL HYSTERECTOMY     fibroids  . ankle surgury     x 3  left - chronic pain/swelling  . SHOULDER SURGERY Left   . TONSILLECTOMY    . TUBAL LIGATION     Family History  Problem Relation Age of Onset  . Hypertension Mother   . Hypertension Father   . Heart disease Sister        sudden death early 76's  . Stroke Sister   . Prostate cancer Brother   . Diabetes Other   . Joint hypermobility Other        DJD   Social History   Socioeconomic History  . Marital status: Divorced     Spouse name: Not on file  . Number of children: 7  . Years of education: Not on file  . Highest education level: Not on file  Occupational History  . Occupation: CASHIER/ retired    Comment: gas station, stands for work  Tobacco Use  . Smoking status: Never Smoker  . Smokeless tobacco: Never Used  Vaping Use  . Vaping Use: Never used  Substance and Sexual Activity  . Alcohol use: No    Alcohol/week: 0.0 standard drinks  . Drug use: No  . Sexual activity: Not Currently  Other Topics Concern  . Not on file  Social History Narrative   Lives with grandson and and pets;    Have 6 sons and one dtr   Have grand- children; running children through out the day and cooking   Social Determinants of Health   Financial Resource Strain: Low Risk   . Difficulty of Paying Living Expenses: Not hard at all  Food Insecurity: No Food Insecurity  . Worried About Charity fundraiser in the Last Year: Never true  . Ran Out of Food in the Last Year: Never true  Transportation Needs: No Transportation Needs  . Lack of Transportation (Medical): No  . Lack of Transportation (Non-Medical): No  Physical Activity: Sufficiently Active  . Days of Exercise per Week: 5 days  . Minutes of Exercise per Session: 30 min  Stress: No Stress Concern Present  . Feeling of Stress : Not at all  Social Connections: Moderately Integrated  . Frequency of Communication with Friends and Family: More than three times a week  . Frequency of Social Gatherings with Friends and Family: More than three times a week  . Attends Religious Services: More than 4 times per year  . Active Member of Clubs or Organizations: No  . Attends Archivist Meetings: More than 4 times per year  . Marital Status: Widowed    Tobacco Counseling Counseling given: Not Answered   Clinical Intake:  Pre-visit preparation completed: Yes  Pain : No/denies pain Pain Score: 0-No pain     BMI - recorded: 37.37 Nutritional Status:  BMI > 30  Obese Nutritional Risks: None Diabetes: No  How often do you need to have someone help you when you read instructions, pamphlets, or other written materials from your doctor or pharmacy?: 1 - Never What is the last grade level you completed in school?: HSG  Diabetic? yes  Interpreter Needed?: No  Information entered by :: Lisette Abu, LPN   Activities of Daily Living In your present state of health, do you have any difficulty performing the following activities: 01/12/2020  Hearing? N  Vision? N  Difficulty concentrating or making decisions? N  Walking or climbing stairs? N  Dressing or bathing? N  Doing errands, shopping? N  Preparing Food and eating ? N  Using the Toilet? N  In the past  six months, have you accidently leaked urine? N  Do you have problems with loss of bowel control? N  Managing your Medications? N  Managing your Finances? N  Housekeeping or managing your Housekeeping? N  Some recent data might be hidden    Patient Care Team: Biagio Borg, MD as PCP - General Lyndal Pulley, DO as Consulting Physician (Family Medicine) Marzetta Board, DPM as Consulting Physician (Podiatry)  Indicate any recent Medical Services you may have received from other than [facility] providers in the past year (date may be approximate).     Assessment:   This is a routine wellness examination for [place].  Hearing/Vision screen No exam data present  Dietary issues and exercise activities discussed: Current Exercise Habits: Home exercise routine, Type of exercise: walking;Other - see comments (work out with dogs everyday), Time (Minutes): 30, Frequency (Times/Week): 5, Weekly Exercise (Minutes/Week): 150, Intensity: Moderate, Exercise limited by: respiratory conditions(s)  Goals    .  I am learning to take tough love measures with my daughter.       Spend time doing things for me, enjoy life, family and be carefree    .  Patient Stated    .  Patient Stated  (pt-stated)      To continue to be independent, active and work on losing weight.      Depression Screen PHQ 2/9 Scores 01/12/2020 01/11/2020 07/08/2019 01/06/2019 07/03/2018 11/15/2017 06/25/2017  PHQ - 2 Score 0 0 0 1 0 0 0  PHQ- 9 Score - - - 1 - - -    Fall Risk Fall Risk  01/12/2020 01/11/2020 07/08/2019 01/06/2019 07/03/2018  Falls in the past year? 0 0 0 1 0  Number falls in past yr: 0 - 0 1 -  Injury with Fall? 0 - 0 0 -  Comment - - - - -  Risk for fall due to : No Fall Risks - No Fall Risks Impaired balance/gait;Impaired mobility -  Follow up Falls evaluation completed - Falls evaluation completed Falls prevention discussed -    Any stairs in or around the home? No  If so, are there any without handrails? No  Home free of loose throw rugs in walkways, pet beds, electrical cords, etc? Yes  Adequate lighting in your home to reduce risk of falls? Yes   ASSISTIVE DEVICES UTILIZED TO PREVENT FALLS:  Life alert? Yes  Use of a cane, walker or w/c? No  Grab bars in the bathroom? No  Shower chair or bench in shower? Yes  Elevated toilet seat or a handicapped toilet? No   TIMED UP AND GO:  Was the test performed? No .  Length of time to ambulate 10 feet: 0 sec.   Gait steady and fast without use of assistive device  Cognitive Function: MMSE - Mini Mental State Exam 11/14/2016 06/29/2014  Not completed: - Unable to complete  Orientation to time 5 -  Orientation to Place 5 -  Registration 3 -  Attention/ Calculation 4 -  Recall 2 -  Language- name 2 objects 2 -  Language- repeat 1 -  Language- follow 3 step command 3 -  Language- read & follow direction 1 -  Write a sentence 1 -  Copy design 1 -  Total score 28 -     6CIT Screen 01/12/2020  What Year? 0 points  What month? 0 points  What time? 0 points  Count back from 20 0 points  Months in reverse 0 points  Repeat phrase 0 points  Total Score 0    Immunizations Immunization History  Administered Date(s)  Administered  . Fluad Quad(high Dose 65+) 01/06/2019  . Influenza Split 11/21/2010, 12/05/2011  . Influenza Whole 12/06/2009  . Influenza, High Dose Seasonal PF 12/04/2012, 12/04/2013, 11/14/2016, 11/15/2017  . Influenza,inj,Quad PF,6+ Mos 12/15/2014  . PFIZER SARS-COV-2 Vaccination 05/23/2019, 05/27/2019, 06/13/2019  . Pneumococcal Conjugate-13 12/19/2012  . Pneumococcal Polysaccharide-23 11/27/2004, 12/06/2009, 06/25/2017  . Td 11/27/2004, 06/08/2014    TDAP status: Up to date Flu Vaccine status: Up to date Pneumococcal vaccine status: Up to date Covid-19 vaccine status: Completed vaccines  Qualifies for Shingles Vaccine? Yes   Zostavax completed No   Shingrix Completed?: No.    Education has been provided regarding the importance of this vaccine. Patient has been advised to call insurance company to determine out of pocket expense if they have not yet received this vaccine. Advised may also receive vaccine at local pharmacy or Health Dept. Verbalized acceptance and understanding.  Screening Tests Health Maintenance  Topic Date Due  . INFLUENZA VACCINE  09/27/2019  . COLONOSCOPY  01/17/2020  . FOOT EXAM  04/07/2020  . OPHTHALMOLOGY EXAM  06/10/2020  . HEMOGLOBIN A1C  07/03/2020  . TETANUS/TDAP  06/07/2024  . DEXA SCAN  Completed  . COVID-19 Vaccine  Completed  . Hepatitis C Screening  Completed  . PNA vac Low Risk Adult  Completed    Health Maintenance  Health Maintenance Due  Topic Date Due  . INFLUENZA VACCINE  09/27/2019    Colorectal cancer screening: Completed 01/16/2010. Repeat every 10 years Mammogram status: Completed 08/20/2018. Repeat every year Bone Density status: Completed 12/16/2015. Results reflect: Bone density results: OSTEOPENIA. Repeat every 2 years.  Lung Cancer Screening: (Low Dose CT Chest recommended if Age 29-80 years, 30 pack-year currently smoking OR have quit w/in 15years.) does not qualify.   Lung Cancer Screening Referral:  no  Additional Screening:  Hepatitis C Screening: does qualify; Completed yes  Vision Screening: Recommended annual ophthalmology exams for early detection of glaucoma and other disorders of the eye. Is the patient up to date with their annual eye exam?  Yes  Who is the provider or what is the name of the office in which the patient attends annual eye exams? Deloria Lair, MD If pt is not established with a provider, would they like to be referred to a provider to establish care? No .   Dental Screening: Recommended annual dental exams for proper oral hygiene  Community Resource Referral / Chronic Care Management: CRR required this visit?  No   CCM required this visit?  No      Plan:     I have personally reviewed and noted the following in the patient's chart:   . Medical and social history . Use of alcohol, tobacco or illicit drugs  . Current medications and supplements . Functional ability and status . Nutritional status . Physical activity . Advanced directives . List of other physicians . Hospitalizations, surgeries, and ER visits in previous 12 months . Vitals . Screenings to include cognitive, depression, and falls . Referrals and appointments  In addition, I have reviewed and discussed with patient certain preventive protocols, quality metrics, and best practice recommendations. A written personalized care plan for preventive services as well as general preventive health recommendations were provided to patient.     Sheral Flow, LPN   29/92/[id number]   Nurse Notes:  There were no vitals filed for this visit.

## 2020-01-15 ENCOUNTER — Other Ambulatory Visit: Payer: Self-pay | Admitting: Internal Medicine

## 2020-01-15 DIAGNOSIS — Z1231 Encounter for screening mammogram for malignant neoplasm of breast: Secondary | ICD-10-CM

## 2020-01-17 ENCOUNTER — Encounter: Payer: Self-pay | Admitting: Internal Medicine

## 2020-01-17 NOTE — Assessment & Plan Note (Signed)
stable overall by history and exam, recent data reviewed with pt, and pt to continue medical treatment as before,  to f/u any worsening symptoms or concerns  

## 2020-01-17 NOTE — Assessment & Plan Note (Signed)
[place] for increased vit d 4000 u qd

## 2020-01-17 NOTE — Assessment & Plan Note (Addendum)
For urine cx,  to f/u any worsening symptoms or concerns  I spent 31 minutes in addition to time for CPX wellness examination in preparing to see the patient by review of recent labs, imaging and procedures, obtaining and reviewing separately obtained history, communicating with the patient and family or caregiver, ordering medications, tests or procedures, and documenting clinical information in the EHR including the differential Dx, treatment, and any further evaluation and other management of urinary freq, pelvic pain, b12 and D deficiency, htn,

## 2020-01-17 NOTE — Assessment & Plan Note (Signed)
Encourage also to fu with gyn

## 2020-01-17 NOTE — Assessment & Plan Note (Signed)
Cont oral replacement 

## 2020-01-17 NOTE — Assessment & Plan Note (Signed)

## 2020-01-18 ENCOUNTER — Ambulatory Visit (INDEPENDENT_AMBULATORY_CARE_PROVIDER_SITE_OTHER): Payer: Medicare Other | Admitting: Obstetrics and Gynecology

## 2020-01-18 ENCOUNTER — Ambulatory Visit: Payer: Medicare Other | Admitting: Podiatry

## 2020-01-18 ENCOUNTER — Other Ambulatory Visit: Payer: Self-pay

## 2020-01-18 ENCOUNTER — Encounter: Payer: Self-pay | Admitting: Obstetrics and Gynecology

## 2020-01-18 ENCOUNTER — Ambulatory Visit
Admission: RE | Admit: 2020-01-18 | Discharge: 2020-01-18 | Disposition: A | Discharge status: Home / Self Care | Payer: Medicare Other | Source: Ambulatory Visit | Attending: Internal Medicine | Admitting: Internal Medicine

## 2020-01-18 VITALS — BP 150/82 | HR 85 | Ht 59.0 in | Wt 187.2 lb

## 2020-01-18 DIAGNOSIS — Z1231 Encounter for screening mammogram for malignant neoplasm of breast: Secondary | ICD-10-CM

## 2020-01-18 DIAGNOSIS — R109 Unspecified abdominal pain: Secondary | ICD-10-CM

## 2020-01-18 DIAGNOSIS — R102 Pelvic and perineal pain: Secondary | ICD-10-CM | POA: Diagnosis not present

## 2020-01-18 NOTE — Progress Notes (Signed)
74 y.o. [id number] Divorced Black or African American Not Hispanic or Latino female here for lower right abdominal pain that radiates to her back.   She c/o a one month h/o intermittent RLQ abdominal pain. More comfortable to wear a gertle. Sometimes hurts when she gets up. Not worse with walking. The pain is a pushing type pain, throbbing, lasts for a couple of seconds. No bowel or bladder changes. No fevers. Normal BM, 1 x a day.   She has a h/o a TAH, still has her ovaries.  Not currently sexually active.     No LMP recorded. Patient has had a hysterectomy.          Sexually active: No.  The current method of family planning is status post hysterectomy.    Exercising: Yes.    very active Smoker:  no  Health Maintenance: Pap:  Years  History of abnormal Pap:  no MMG:  01/18/20 not yet resulted  BMD:   2017 Colonoscopy: due  TDaP:  06/08/14 Gardasil: none    reports that she has never smoked. She has never used smokeless tobacco. She reports that she does not drink alcohol and does not use drugs. 7 kids, 28 grand children, 6 great grandchildren.   Past Medical History:  Diagnosis Date  . ANEMIA-IRON DEFICIENCY 12/06/2009  . Anxiety state 07/01/2015  . Asthma 12/05/2011  . DIABETES MELLITUS, TYPE II 09/17/2006  . DIVERTICULOSIS, COLON 12/06/2009  . GENITAL HERPES 03/24/2010  . GERD (gastroesophageal reflux disease) 06/28/2011  . HYPERLIPIDEMIA 03/24/2010  . HYPERTENSION 09/17/2006  . HYPOTHYROIDISM 09/17/2006  . LIPOMA 12/06/2009  . PERIPHERAL EDEMA 03/24/2010  . Renal cyst 07/05/2011   1.9 cm minimally complex    Past Surgical History:  Procedure Laterality Date  . ABDOMINAL HYSTERECTOMY     fibroids  . ankle surgury     x 3 left - chronic pain/swelling  . SHOULDER SURGERY Left   . TONSILLECTOMY    . TUBAL LIGATION      Current Outpatient Medications  Medication Sig Dispense Refill  . albuterol (VENTOLIN HFA) 108 (90 Base) MCG/ACT inhaler INHALE 2 PUFFS BY MOUTH EVERY 6 HOURS  AS NEEDED FOR WHEEZE 8.5 g 5  . amLODipine (NORVASC) 10 MG tablet Take 1 tablet (10 mg total) by mouth daily. 90 tablet 3  . aspirin 81 MG EC tablet TAKE 1 TABLET BY MOUTH DAILY. SWALLOW WHOLE. 30 tablet 11  . atorvastatin (LIPITOR) 40 MG tablet Take 1 tablet (40 mg total) by mouth daily. 90 tablet 3  . Cholecalciferol (VITAMIN D) 400 UNITS tablet Take 400 Units by mouth daily.      . clotrimazole-betamethasone (LOTRISONE) cream Apply to both feet and between toes bid x 4 weeks. 45 g 1  . furosemide (LASIX) 20 MG tablet TAKE 1 TABLET BY MOUTH EVERY DAY 90 tablet 3  . losartan (COZAAR) 50 MG tablet Take 1 tablet (50 mg total) by mouth daily. 90 tablet 3  . Misc Natural Products (OSTEO BI-FLEX TRIPLE STRENGTH PO) Take by mouth.    . Multiple Vitamins-Minerals (WOMENS MULTIVITAMIN PO) Take 1 tablet by mouth daily.    Glory Rosebush DELICA LANCETS 18A MISC 1 each by Does not apply route 2 (two) times daily. Use to help check blood sugars twice a day Dx E11.9 200 each 11  . ONETOUCH VERIO test strip USE 2 (TWO) TIMES DAILY. DX E11.9 100 strip 4  . pioglitazone (ACTOS) 15 MG tablet TAKE 1 TABLET BY MOUTH EVERY DAY 90 tablet  1  . potassium chloride (KLOR-CON) 10 MEQ tablet TAKE 1 TABLET BY MOUTH EVERY DAY 90 tablet 2   No current facility-administered medications for this visit.    Family History  Problem Relation Age of Onset  . Hypertension Mother   . Hypertension Father   . Heart disease Sister        sudden death early 15's  . Stroke Sister   . Breast cancer Sister 18  . Prostate cancer Brother   . Diabetes Other   . Joint hypermobility Other        DJD    Review of Systems  Gastrointestinal: Positive for abdominal pain.  All other systems reviewed and are negative.   Exam:   BP (!) 150/82 (BP Location: Left Arm, Patient Position: Sitting, Cuff Size: Large)   Pulse 85   Ht 4\' 11"  (1.499 m)   Wt 187 lb 3.2 oz (84.9 kg)   SpO2 99%   BMI 37.81 kg/m   Weight change: @WEIGHTCHANGE @  Height:   Height: 4\' 11"  (149.9 cm)  Ht Readings from Last 3 Encounters:  01/18/20 4\' 11"  (1.499 m)  01/12/20 4\' 11"  (1.499 m)  01/11/20 4\' 11"  (1.499 m)    General appearance: alert, cooperative and appears stated age Abdomen: soft, non-tender; non distended,  no masses,  no organomegaly Lower back: not tender   Pelvic: External genitalia:  no lesions              Urethra:  normal appearing urethra with no masses, tenderness or lesions              Bartholins and Skenes: normal                 Vagina: atrophic appearing vagina with normal color and discharge, no lesions              Cervix: absent               Bimanual Exam:  Uterus:  uterus absent              Adnexa: no mass, fullness, tenderness               Rectovaginal: Confirms               Anus:  normal sphincter tone, no lesions  Bladder: not tender  Pelvic floor: not tender  Gae Dry chaperoned for the exam.  A:  RLQ abdominal/pelvic pain, normal exam  P:   Will set her up for a pelvic ultrasound. If negative will have her f/u with her primary  CC: Dr Jenny Reichmann

## 2020-01-20 ENCOUNTER — Telehealth: Payer: Self-pay

## 2020-01-20 NOTE — Telephone Encounter (Signed)
Spoke with patient regarding benefits for recommended ultrasound. Patient acknowledges understanding of information presented. Patient is aware of cancellation policy. Patient scheduled appointment for 02/02/2020 at 0200PM with Sumner Boast, MD. Encounter closed.

## 2020-02-02 ENCOUNTER — Other Ambulatory Visit: Payer: Self-pay

## 2020-02-02 ENCOUNTER — Encounter: Payer: Self-pay | Admitting: Obstetrics and Gynecology

## 2020-02-02 ENCOUNTER — Ambulatory Visit (INDEPENDENT_AMBULATORY_CARE_PROVIDER_SITE_OTHER): Payer: Medicare Other

## 2020-02-02 ENCOUNTER — Ambulatory Visit (INDEPENDENT_AMBULATORY_CARE_PROVIDER_SITE_OTHER): Payer: Medicare Other | Admitting: Obstetrics and Gynecology

## 2020-02-02 VITALS — BP 130/80 | HR 95 | Ht 59.0 in | Wt 187.0 lb

## 2020-02-02 DIAGNOSIS — K5900 Constipation, unspecified: Secondary | ICD-10-CM | POA: Diagnosis not present

## 2020-02-02 DIAGNOSIS — Z113 Encounter for screening for infections with a predominantly sexual mode of transmission: Secondary | ICD-10-CM | POA: Diagnosis not present

## 2020-02-02 DIAGNOSIS — R109 Unspecified abdominal pain: Secondary | ICD-10-CM | POA: Diagnosis not present

## 2020-02-02 DIAGNOSIS — R102 Pelvic and perineal pain: Secondary | ICD-10-CM | POA: Diagnosis not present

## 2020-02-02 NOTE — Progress Notes (Signed)
GYNECOLOGY  VISIT   HPI: 74 y.o.   Divorced Black or [place] American Not Hispanic or Latino  female   [phone] with No LMP recorded. Patient has had a hysterectomy.   here for evaluation of RLQ abdominal/pelvic pain. H/O TAH.   BM every 1-3 days. Upper abdomen gets upset with greasy foods or dry food (ie nuts, crackers).   She reports being sexually active off and on with the same man for 9-10 years. He is a Administrator and she doesn't know if he is sexually active with anyone else. They don't use condoms. She hasn't had STD testing.   GYNECOLOGIC HISTORY: No LMP recorded. Patient has had a hysterectomy. Contraception:PMP Menopausal hormone therapy: none        OB History    Gravida  7   Para  7   Term  6   Preterm  1   AB      Living  7     SAB      TAB      Ectopic      Multiple      Live Births  7              Patient Active Problem List   Diagnosis Date Noted  . Urinary frequency 01/11/2020  . Pelvic pain 01/11/2020  . Vitamin D deficiency 01/11/2020  . B12 deficiency 01/11/2020  . Superficial phlebitis 09/03/2019  . Pain of right thumb 01/06/2019  . Sprain of second toe, left, initial encounter 12/18/2017  . Acute upper respiratory infection 01/04/2017  . Greater trochanteric bursitis of left hip 12/19/2015  . Right otitis media 12/16/2015  . Shoulder dislocation 08/03/2015  . Anxiety state 07/01/2015  . Cellulitis 04/01/2015  . Osteoarthritis of left ankle 04/01/2015  . Bilateral leg pain 02/15/2015  . PVD (peripheral vascular disease) ([place]) 12/22/2014  . Cellulitis of right lower leg 12/13/2014  . Right leg pain 11/10/2014  . Hx of colonic polyps 09/08/2013  . Obesity (BMI 30-39.9) 09/08/2013  . Impingement syndrome of left shoulder 06/04/2012  . Mild aortic stenosis 12/05/2011  . Asthma 12/05/2011  . Renal cyst 07/05/2011  . Low back pain 06/28/2011  . GERD (gastroesophageal reflux disease) 06/28/2011  . Carpal tunnel syndrome of right  wrist 04/22/2011  . Right foot pain 05/23/2010  . Encounter for well adult exam with abnormal findings 05/23/2010  . GENITAL HERPES 03/24/2010  . Hyperlipidemia 03/24/2010  . PERIPHERAL EDEMA 03/24/2010  . Nocturia 03/24/2010  . LIPOMA 12/06/2009  . ANEMIA-IRON DEFICIENCY 12/06/2009  . CONJUNCTIVITIS, BILATERAL 12/06/2009  . DIVERTICULOSIS, COLON 12/06/2009  . HYPOTHYROIDISM 09/17/2006  . Diabetes ([place]) 09/17/2006  . Essential hypertension 09/17/2006    Past Medical History:  Diagnosis Date  . ANEMIA-IRON DEFICIENCY 12/06/2009  . Anxiety state 07/01/2015  . Asthma 12/05/2011  . DIABETES MELLITUS, TYPE II 09/17/2006  . DIVERTICULOSIS, COLON 12/06/2009  . GENITAL HERPES 03/24/2010  . GERD (gastroesophageal reflux disease) 06/28/2011  . HYPERLIPIDEMIA 03/24/2010  . HYPERTENSION 09/17/2006  . HYPOTHYROIDISM 09/17/2006  . LIPOMA 12/06/2009  . PERIPHERAL EDEMA 03/24/2010  . Renal cyst 07/05/2011   1.9 cm minimally complex    Past Surgical History:  Procedure Laterality Date  . ABDOMINAL HYSTERECTOMY     fibroids  . ankle surgury     x 3 left - chronic pain/swelling  . SHOULDER SURGERY Left   . TONSILLECTOMY    . TUBAL LIGATION      Current Outpatient Medications  Medication Sig Dispense Refill  .  albuterol (VENTOLIN HFA) 108 (90 Base) MCG/ACT inhaler INHALE 2 PUFFS BY MOUTH EVERY 6 HOURS AS NEEDED FOR WHEEZE 8.5 g 5  . amLODipine (NORVASC) 10 MG tablet Take 1 tablet (10 mg total) by mouth daily. 90 tablet 3  . aspirin 81 MG EC tablet TAKE 1 TABLET BY MOUTH DAILY. SWALLOW WHOLE. 30 tablet 11  . atorvastatin (LIPITOR) 40 MG tablet Take 1 tablet (40 mg total) by mouth daily. 90 tablet 3  . Cholecalciferol (VITAMIN D) 400 UNITS tablet Take 400 Units by mouth daily.      . clotrimazole-betamethasone (LOTRISONE) cream Apply to both feet and between toes bid x 4 weeks. 45 g 1  . furosemide (LASIX) 20 MG tablet TAKE 1 TABLET BY MOUTH EVERY DAY 90 tablet 3  . losartan (COZAAR) 50 MG tablet  Take 1 tablet (50 mg total) by mouth daily. 90 tablet 3  . Misc Natural Products (OSTEO BI-FLEX TRIPLE STRENGTH PO) Take by mouth.    . Multiple Vitamins-Minerals (WOMENS MULTIVITAMIN PO) Take 1 tablet by mouth daily.    Glory Rosebush DELICA LANCETS 97L MISC 1 each by Does not apply route 2 (two) times daily. Use to help check blood sugars twice a day Dx E11.9 200 each 11  . ONETOUCH VERIO test strip USE 2 (TWO) TIMES DAILY. DX E11.9 100 strip 4  . pioglitazone (ACTOS) 15 MG tablet TAKE 1 TABLET BY MOUTH EVERY DAY 90 tablet 1  . potassium chloride (KLOR-CON) 10 MEQ tablet TAKE 1 TABLET BY MOUTH EVERY DAY 90 tablet 2   No current facility-administered medications for this visit.     ALLERGIES: Januvia [sitagliptin phosphate], Metformin and related, and Doxycycline  Family History  Problem Relation Age of Onset  . Hypertension Mother   . Hypertension Father   . Heart disease Sister        sudden death early 72's  . Stroke Sister   . Breast cancer Sister 12  . Prostate cancer Brother   . Diabetes Other   . Joint hypermobility Other        DJD    Social History   Socioeconomic History  . Marital status: Divorced    Spouse name: Not on file  . Number of children: 7  . Years of education: Not on file  . Highest education level: Not on file  Occupational History  . Occupation: CASHIER/ retired    Comment: gas station, stands for work  Tobacco Use  . Smoking status: Never Smoker  . Smokeless tobacco: Never Used  Vaping Use  . Vaping Use: Never used  Substance and Sexual Activity  . Alcohol use: No    Alcohol/week: 0.0 standard drinks  . Drug use: No  . Sexual activity: Not Currently  Other Topics Concern  . Not on file  Social History Narrative   Lives with grandson and and pets;    Have 6 sons and one dtr   Have grand- children; running children through out the day and cooking   Social Determinants of Health   Financial Resource Strain: Low Risk   . Difficulty of  Paying Living Expenses: Not hard at all  Food Insecurity: No Food Insecurity  . Worried About Charity fundraiser in the Last Year: Never true  . Ran Out of Food in the Last Year: Never true  Transportation Needs: No Transportation Needs  . Lack of Transportation (Medical): No  . Lack of Transportation (Non-Medical): No  Physical Activity: Sufficiently Active  . Days  of Exercise per Week: 5 days  . Minutes of Exercise per Session: 30 min  Stress: No Stress Concern Present  . Feeling of Stress : Not at all  Social Connections: Moderately Integrated  . Frequency of Communication with Friends and Family: More than three times a week  . Frequency of Social Gatherings with Friends and Family: More than three times a week  . Attends Religious Services: More than 4 times per year  . Active Member of Clubs or Organizations: No  . Attends Archivist Meetings: More than 4 times per year  . Marital Status: Widowed  Intimate Partner Violence:   . Fear of Current or Ex-Partner: Not on file  . Emotionally Abused: Not on file  . Physically Abused: Not on file  . Sexually Abused: Not on file    ROS  PHYSICAL EXAMINATION:    BP 130/80   Pulse 95   Ht 4\' 11"  (1.499 m)   Wt 187 lb (84.8 kg)   SpO2 98%   BMI 37.77 kg/m     General appearance: alert, cooperative and appears stated age   Pelvic: External genitalia:  no lesions              Urethra:  normal appearing urethra with no masses, tenderness or lesions              Bartholins and Skenes: normal                 Vagina: mildly atrophic appearing vagina with normal color and discharge, no lesions              Cervix:absent  Chaperone was present for exam.  ASSESSMENT Abdominal/pelvic pain. Negative pelvic ultrasound Mild constipation Screening for STD's    PLAN She is setting up a colonoscopy with her primary Will f/u with her primary for evaluation of her pain. STD screening done Recommended she use condoms,  condoms given  In addition to reviewing her ultrasound, over 20 minutes was spent in counseling about constipation and risk for STD and condom use.

## 2020-02-02 NOTE — Patient Instructions (Signed)

## 2020-02-03 ENCOUNTER — Telehealth: Payer: Self-pay | Admitting: Internal Medicine

## 2020-02-03 LAB — HEP, RPR, HIV PANEL
HIV Screen 4th Generation wRfx: NONREACTIVE
Hepatitis B Surface Ag: NEGATIVE
RPR Ser Ql: NONREACTIVE

## 2020-02-03 LAB — HEPATITIS C ANTIBODY: Hep C Virus Ab: 0.2 s/co ratio (ref 0.0–0.9)

## 2020-02-03 NOTE — Telephone Encounter (Signed)
    Patient requesting medication for pelvic/ back pain be called to [id number] - [place], [place] [address] Patient states she recently had testing done by GYN, normal results Still having some pain since last visit 11/15

## 2020-02-05 LAB — CHLAMYDIA/GONOCOCCUS/TRICHOMONAS, NAA
Chlamydia by NAA: NEGATIVE
Gonococcus by NAA: NEGATIVE
Trich vag by NAA: NEGATIVE

## 2020-02-05 NOTE — Telephone Encounter (Signed)
I dont have any non narcotic to offer as the kidney have been slightly slower at the last visit, and should not tx with prescription nsaid

## 2020-02-08 NOTE — Telephone Encounter (Signed)
Tried reaching pt to advise her on Dr. Gwynn Burly note. Pts phone rang busy.

## 2020-02-15 ENCOUNTER — Encounter: Payer: Self-pay | Admitting: Podiatry

## 2020-02-15 ENCOUNTER — Other Ambulatory Visit: Payer: Self-pay

## 2020-02-15 ENCOUNTER — Ambulatory Visit (INDEPENDENT_AMBULATORY_CARE_PROVIDER_SITE_OTHER): Payer: Medicare Other | Admitting: Podiatry

## 2020-02-15 DIAGNOSIS — B351 Tinea unguium: Secondary | ICD-10-CM | POA: Diagnosis not present

## 2020-02-15 DIAGNOSIS — M79674 Pain in right toe(s): Secondary | ICD-10-CM | POA: Diagnosis not present

## 2020-02-15 DIAGNOSIS — E119 Type 2 diabetes mellitus without complications: Secondary | ICD-10-CM | POA: Diagnosis not present

## 2020-02-15 DIAGNOSIS — M79675 Pain in left toe(s): Secondary | ICD-10-CM

## 2020-02-18 ENCOUNTER — Other Ambulatory Visit: Payer: Self-pay

## 2020-02-18 ENCOUNTER — Encounter (INDEPENDENT_AMBULATORY_CARE_PROVIDER_SITE_OTHER): Payer: Self-pay | Admitting: Ophthalmology

## 2020-02-18 ENCOUNTER — Ambulatory Visit (INDEPENDENT_AMBULATORY_CARE_PROVIDER_SITE_OTHER): Payer: Medicare Other | Admitting: Ophthalmology

## 2020-02-18 ENCOUNTER — Encounter (INDEPENDENT_AMBULATORY_CARE_PROVIDER_SITE_OTHER): Payer: Medicare Other | Admitting: Ophthalmology

## 2020-02-18 DIAGNOSIS — E119 Type 2 diabetes mellitus without complications: Secondary | ICD-10-CM | POA: Insufficient documentation

## 2020-02-18 DIAGNOSIS — H2513 Age-related nuclear cataract, bilateral: Secondary | ICD-10-CM | POA: Diagnosis not present

## 2020-02-18 DIAGNOSIS — H43811 Vitreous degeneration, right eye: Secondary | ICD-10-CM | POA: Diagnosis not present

## 2020-02-18 DIAGNOSIS — H43812 Vitreous degeneration, left eye: Secondary | ICD-10-CM

## 2020-02-18 NOTE — Progress Notes (Signed)
02/18/2020     CHIEF COMPLAINT Patient presents for Retina Follow Up (1 Year Diabetic F/U OU//Pt denies noticeable changes to [place] OU since last visit. Pt denies ocular pain, flashes of light, or floaters OU. //A1c: 5.1, 12/2019/LBS: 113 this AM)   HISTORY OF PRESENT ILLNESS: Brandi Shaffer is a 74 y.o. female who presents to the clinic today for:   HPI    Retina Follow Up    Patient presents with  Diabetic Retinopathy.  In both eyes.  This started 1 year ago.  Severity is mild.  Duration of 1 year.  Since onset it is stable. Additional comments: 1 Year Diabetic F/U OU  Pt denies noticeable changes to [place] OU since last visit. Pt denies ocular pain, flashes of light, or floaters OU.   A1c: 5.1, 12/2019 LBS: 113 this AM       Last edited by Ileana RoupKennerly, Paige, COA on 02/18/2020  9:52 AM. (History)      Referring physician: Corwin LevinsJohn, James W, MD [address] [place] [id number]  HISTORICAL INFORMATION:   Selected notes from the MEDICAL RECORD NUMBER    Lab Results  Component Value Date   HGBA1C 5.9 01/04/2020     CURRENT MEDICATIONS: No current outpatient medications on file. (Ophthalmic Drugs)   No current facility-administered medications for this visit. (Ophthalmic Drugs)   Current Outpatient Medications (Other)  Medication Sig  . albuterol (VENTOLIN HFA) 108 (90 Base) MCG/ACT inhaler INHALE 2 PUFFS BY MOUTH EVERY 6 HOURS AS NEEDED FOR WHEEZE  . amLODipine (NORVASC) 10 MG tablet Take 1 tablet (10 mg total) by mouth daily.  Marland Kitchen. aspirin 81 MG EC tablet TAKE 1 TABLET BY MOUTH DAILY. SWALLOW WHOLE.  Marland Kitchen. atorvastatin (LIPITOR) 40 MG tablet Take 1 tablet (40 mg total) by mouth daily.  . Cholecalciferol (VITAMIN D) 400 UNITS tablet Take 400 Units by mouth daily.    . clotrimazole-betamethasone (LOTRISONE) cream Apply to both feet and between toes bid x 4 weeks.  . furosemide (LASIX) 20 MG tablet TAKE 1 TABLET BY MOUTH EVERY DAY  . losartan (COZAAR) 50 MG tablet Take 1  tablet (50 mg total) by mouth daily.  . Misc Natural Products (OSTEO BI-FLEX TRIPLE STRENGTH PO) Take by mouth.  . Multiple Vitamins-Minerals (WOMENS MULTIVITAMIN PO) Take 1 tablet by mouth daily.  Letta Pate. ONETOUCH DELICA LANCETS 33G MISC 1 each by Does not apply route 2 (two) times daily. Use to help check blood sugars twice a day Dx E11.9  . ONETOUCH VERIO test strip USE 2 (TWO) TIMES DAILY. DX E11.9  . pioglitazone (ACTOS) 15 MG tablet TAKE 1 TABLET BY MOUTH EVERY DAY  . potassium chloride (KLOR-CON) 10 MEQ tablet TAKE 1 TABLET BY MOUTH EVERY DAY   No current facility-administered medications for this visit. (Other)      REVIEW OF SYSTEMS:    ALLERGIES Allergies  Allergen Reactions  . Januvia [Sitagliptin Phosphate]     dizziness  . Metformin And Related Nausea Only  . Doxycycline Rash    PAST MEDICAL HISTORY Past Medical History:  Diagnosis Date  . ANEMIA-IRON DEFICIENCY 12/06/2009  . Anxiety state 07/01/2015  . Asthma 12/05/2011  . DIABETES MELLITUS, TYPE II 09/17/2006  . DIVERTICULOSIS, COLON 12/06/2009  . GENITAL HERPES 03/24/2010  . GERD (gastroesophageal reflux disease) 06/28/2011  . HYPERLIPIDEMIA 03/24/2010  . HYPERTENSION 09/17/2006  . HYPOTHYROIDISM 09/17/2006  . LIPOMA 12/06/2009  . PERIPHERAL EDEMA 03/24/2010  . Renal cyst 07/05/2011   1.9 cm minimally  complex   Past Surgical History:  Procedure Laterality Date  . ABDOMINAL HYSTERECTOMY     fibroids  . ankle surgury     x 3 left - chronic pain/swelling  . SHOULDER SURGERY Left   . TONSILLECTOMY    . TUBAL LIGATION      FAMILY HISTORY Family History  Problem Relation Age of Onset  . Hypertension Mother   . Hypertension Father   . Heart disease Sister        sudden death early 20's  . Stroke Sister   . Breast cancer Sister 42  . Prostate cancer Brother   . Diabetes Other   . Joint hypermobility Other        DJD    SOCIAL HISTORY Social History   Tobacco Use  . Smoking status: Never Smoker  .  Smokeless tobacco: Never Used  Vaping Use  . Vaping Use: Never used  Substance Use Topics  . Alcohol use: No    Alcohol/week: 0.0 standard drinks  . Drug use: No         OPHTHALMIC EXAM: Base Eye Exam    Visual Acuity (ETDRS)      Right Left   Dist cc 20/20 -2 20/20 -1   Correction: Glasses       Tonometry (Tonopen, 9:50 AM)      Right Left   Pressure 11 15       Pupils      Pupils Dark Light Shape React APD   Right PERRL 5 4 Round Brisk None   Left PERRL 5 4 Round Brisk None       Visual Fields (Counting fingers)      Left Right    Full Full       Extraocular Movement      Right Left    Full Full       Neuro/Psych    Oriented x3: Yes   Mood/Affect: Normal       Dilation    Both eyes: 1.0% Mydriacyl, 2.5% Phenylephrine @ 9:56 AM        Slit Lamp and Fundus Exam    External Exam      Right Left   External Normal Normal       Slit Lamp Exam      Right Left   Lids/Lashes Normal Normal   Conjunctiva/Sclera White and quiet White and quiet   Cornea Clear Clear   Anterior Chamber Deep and quiet Deep and quiet   Iris Round and reactive Round and reactive   Lens 2+ Nuclear sclerosis 2+ Nuclear sclerosis   Anterior Vitreous Normal Normal       Fundus Exam      Right Left   Posterior Vitreous Posterior vitreous detachment Posterior vitreous detachment   Disc Normal Normal   C/D Ratio 0.6 0.6   Macula Normal Normal   Vessels no DR no DR   Periphery Normal Normal          IMAGING AND PROCEDURES  Imaging and Procedures for 02/18/20  OCT, Retina - OU - Both Eyes       Right Eye Quality was good. Scan locations included subfoveal. Central Foveal Thickness: 247. Progression has been stable.   Left Eye Quality was good. Scan locations included subfoveal. Central Foveal Thickness: 238. Progression has been stable.   Notes PVD OU                ASSESSMENT/PLAN:  Nuclear sclerotic cataract of both eyes The  nature of cataract was  discussed with the patient as well as the elective nature of surgery. The patient was reassured that surgery at a later date does not put the patient at risk for a worse outcome. It was emphasized that the need for surgery is dictated by the patient's quality of life as influenced by the cataract. Patient was instructed to maintain close follow up with their general eye care doctor.  Diabetes mellitus without complication ([place]) The patient has diabetes without any evidence of retinopathy. The patient advised to maintain good blood glucose control, excellent blood pressure control, and favorable levels of cholesterol, low density lipoprotein, and high density lipoproteins. Follow up in 1 year was recommended. Explained that fluctuations in visual acuity , or "out of focus", may result from large variations of blood sugar control.  Posterior vitreous detachment of left eye   The nature of posterior vitreous detachment was discussed with the patient as well as its physiology, its age prevalence, and its possible implication regarding retinal breaks and detachment.  An informational brochure was given to the patient.  All the patient's questions were answered.  The patient was asked to return if new or different flashes or floaters develops.   Patient was instructed to contact office immediately if any changes were noticed. I explained to the patient that vitreous inside the eye is similar to jello inside a bowl. As the jello melts it can start to pull away from the bowl, similarly the vitreous throughout our lives can begin to pull away from the retina. That process is called a posterior vitreous detachment. In some cases, the vitreous can tug hard enough on the retina to form a retinal tear. I discussed with the patient the signs and symptoms of a retinal detachment.  Do not rub the eye.      ICD-10-CM   1. Posterior vitreous detachment of right eye  H43.811 OCT, Retina - OU - Both Eyes  2. Posterior  vitreous detachment of left eye  H43.812 OCT, Retina - OU - Both Eyes  3. Diabetes mellitus without complication ([place])  U20.2   4. Nuclear sclerotic cataract of both eyes  H25.13     1.  2.  3.  Ophthalmic Meds Ordered this visit:  No orders of the defined types were placed in this encounter.      Return in about 1 year (around 02/17/2021) for DILATE OU, OCT.  There are no Patient Instructions on file for this visit.   Explained the diagnoses, plan, and follow up with the patient and they expressed understanding.  Patient expressed understanding of the importance of proper follow up care.   Clent Demark Seymore Brodowski M.D. Diseases & Surgery of the Retina and Vitreous Retina & Diabetic [place] 02/18/20     Abbreviations: M myopia (nearsighted); A astigmatism; H hyperopia (farsighted); P presbyopia; Mrx spectacle prescription;  CTL contact lenses; OD right eye; OS left eye; OU both eyes  XT exotropia; ET esotropia; PEK punctate epithelial keratitis; PEE punctate epithelial erosions; DES dry eye syndrome; MGD meibomian gland dysfunction; ATs artificial tears; PFAT's preservative free artificial tears; [place] nuclear sclerotic cataract; PSC posterior subcapsular cataract; ERM epi-retinal membrane; PVD posterior vitreous detachment; RD retinal detachment; DM diabetes mellitus; DR diabetic retinopathy; NPDR non-proliferative diabetic retinopathy; PDR proliferative diabetic retinopathy; CSME clinically significant macular edema; DME diabetic macular edema; dbh dot blot hemorrhages; CWS cotton wool spot; POAG primary open angle glaucoma; C/D cup-to-disc ratio; HVF humphrey visual field; GVF goldmann visual field; OCT  optical coherence tomography; IOP intraocular pressure; BRVO Branch retinal vein occlusion; CRVO central retinal vein occlusion; CRAO central retinal artery occlusion; BRAO branch retinal artery occlusion; RT retinal tear; SB scleral buckle; PPV pars plana vitrectomy; VH Vitreous  hemorrhage; PRP panretinal laser photocoagulation; IVK intravitreal kenalog; VMT vitreomacular traction; MH Macular hole;  NVD neovascularization of the disc; NVE neovascularization elsewhere; AREDS age related eye disease study; ARMD age related macular degeneration; POAG primary open angle glaucoma; EBMD epithelial/anterior basement membrane dystrophy; ACIOL anterior chamber intraocular lens; IOL intraocular lens; PCIOL posterior chamber intraocular lens; Phaco/IOL phacoemulsification with intraocular lens placement; [place] photorefractive keratectomy; LASIK laser assisted in situ keratomileusis; HTN hypertension; DM diabetes mellitus; COPD chronic obstructive pulmonary disease

## 2020-02-18 NOTE — Assessment & Plan Note (Signed)

## 2020-02-18 NOTE — Assessment & Plan Note (Signed)

## 2020-02-18 NOTE — Assessment & Plan Note (Signed)

## 2020-02-21 NOTE — Progress Notes (Signed)
Subjective:  Patient ID: Brandi Shaffer, female    DOB: [birth year],  MRN: [id number]  74 y.o. female presents with preventative diabetic foot care and painful thick toenails that are difficult to trim. Pain interferes with ambulation. Aggravating factors include wearing enclosed shoe gear. Pain is relieved with periodic professional debridement..    Patient's blood sugar was 99 mg/dl yesterday morning.  Patient did not check blood glucose this morning.  PCP: Corwin Levins, MD and last visit was: 01/11/2020.  Review of Systems: Negative except as noted in the HPI.  Past Medical History:  Diagnosis Date  . ANEMIA-IRON DEFICIENCY 12/06/2009  . Anxiety state 07/01/2015  . Asthma 12/05/2011  . DIABETES MELLITUS, TYPE II 09/17/2006  . DIVERTICULOSIS, COLON 12/06/2009  . GENITAL HERPES 03/24/2010  . GERD (gastroesophageal reflux disease) 06/28/2011  . HYPERLIPIDEMIA 03/24/2010  . HYPERTENSION 09/17/2006  . HYPOTHYROIDISM 09/17/2006  . LIPOMA 12/06/2009  . PERIPHERAL EDEMA 03/24/2010  . Renal cyst 07/05/2011   1.9 cm minimally complex   Past Surgical History:  Procedure Laterality Date  . ABDOMINAL HYSTERECTOMY     fibroids  . ankle surgury     x 3 left - chronic pain/swelling  . SHOULDER SURGERY Left   . TONSILLECTOMY    . TUBAL LIGATION     Patient Active Problem List   Diagnosis Date Noted  . Posterior vitreous detachment of right eye 02/18/2020  . Posterior vitreous detachment of left eye 02/18/2020  . Diabetes mellitus without complication (HCC) 02/18/2020  . Nuclear sclerotic cataract of both eyes 02/18/2020  . Urinary frequency 01/11/2020  . Pelvic pain 01/11/2020  . Vitamin D deficiency 01/11/2020  . B12 deficiency 01/11/2020  . Superficial phlebitis 09/03/2019  . Pain of right thumb 01/06/2019  . Sprain of second toe, left, initial encounter 12/18/2017  . Acute upper respiratory infection 01/04/2017  . Greater trochanteric bursitis of left hip 12/19/2015  . Right otitis  media 12/16/2015  . Shoulder dislocation 08/03/2015  . Anxiety state 07/01/2015  . Cellulitis 04/01/2015  . Osteoarthritis of left ankle 04/01/2015  . Bilateral leg pain 02/15/2015  . PVD (peripheral vascular disease) (HCC) 12/22/2014  . Cellulitis of right lower leg 12/13/2014  . Right leg pain 11/10/2014  . Hx of colonic polyps 09/08/2013  . Obesity (BMI 30-39.9) 09/08/2013  . Impingement syndrome of left shoulder 06/04/2012  . Mild aortic stenosis 12/05/2011  . Asthma 12/05/2011  . Renal cyst 07/05/2011  . Low back pain 06/28/2011  . GERD (gastroesophageal reflux disease) 06/28/2011  . Carpal tunnel syndrome of right wrist 04/22/2011  . Right foot pain 05/23/2010  . Encounter for well adult exam with abnormal findings 05/23/2010  . GENITAL HERPES 03/24/2010  . Hyperlipidemia 03/24/2010  . PERIPHERAL EDEMA 03/24/2010  . Nocturia 03/24/2010  . LIPOMA 12/06/2009  . ANEMIA-IRON DEFICIENCY 12/06/2009  . CONJUNCTIVITIS, BILATERAL 12/06/2009  . DIVERTICULOSIS, COLON 12/06/2009  . HYPOTHYROIDISM 09/17/2006  . Diabetes (HCC) 09/17/2006  . Essential hypertension 09/17/2006    Current Outpatient Medications:  .  albuterol (VENTOLIN HFA) 108 (90 Base) MCG/ACT inhaler, INHALE 2 PUFFS BY MOUTH EVERY 6 HOURS AS NEEDED FOR WHEEZE, Disp: 8.5 g, Rfl: 5 .  amLODipine (NORVASC) 10 MG tablet, Take 1 tablet (10 mg total) by mouth daily., Disp: 90 tablet, Rfl: 3 .  aspirin 81 MG EC tablet, TAKE 1 TABLET BY MOUTH DAILY. SWALLOW WHOLE., Disp: 30 tablet, Rfl: 11 .  atorvastatin (LIPITOR) 40 MG tablet, Take 1 tablet (40 mg total) by mouth daily., Disp:  90 tablet, Rfl: 3 .  Cholecalciferol (VITAMIN D) 400 UNITS tablet, Take 400 Units by mouth daily.  , Disp: , Rfl:  .  clotrimazole-betamethasone (LOTRISONE) cream, Apply to both feet and between toes bid x 4 weeks., Disp: 45 g, Rfl: 1 .  furosemide (LASIX) 20 MG tablet, TAKE 1 TABLET BY MOUTH EVERY DAY, Disp: 90 tablet, Rfl: 3 .  losartan (COZAAR) 50  MG tablet, Take 1 tablet (50 mg total) by mouth daily., Disp: 90 tablet, Rfl: 3 .  Misc Natural Products (OSTEO BI-FLEX TRIPLE STRENGTH PO), Take by mouth., Disp: , Rfl:  .  Multiple Vitamins-Minerals (WOMENS MULTIVITAMIN PO), Take 1 tablet by mouth daily., Disp: , Rfl:  .  ONETOUCH DELICA LANCETS 33G MISC, 1 each by Does not apply route 2 (two) times daily. Use to help check blood sugars twice a day Dx E11.9, Disp: 200 each, Rfl: 11 .  ONETOUCH VERIO test strip, USE 2 (TWO) TIMES DAILY. DX E11.9, Disp: 100 strip, Rfl: 4 .  pioglitazone (ACTOS) 15 MG tablet, TAKE 1 TABLET BY MOUTH EVERY DAY, Disp: 90 tablet, Rfl: 1 .  potassium chloride (KLOR-CON) 10 MEQ tablet, TAKE 1 TABLET BY MOUTH EVERY DAY, Disp: 90 tablet, Rfl: 2 Allergies  Allergen Reactions  . Januvia [Sitagliptin Phosphate]     dizziness  . Metformin And Related Nausea Only  . Doxycycline Rash   Social History   Tobacco Use  Smoking Status Never Smoker  Smokeless Tobacco Never Used    Objective:  There were no vitals filed for this visit. Constitutional Patient is a pleasant 74 y.o. African American female in NAD. AAO x 3.  Vascular Capillary refill time to digits immediate b/l. Palpable pedal pulses b/l LE. Pedal hair present. Lower extremity skin temperature gradient within normal limits. No cyanosis or clubbing noted.  Neurologic Normal speech. Protective sensation intact 5/5 intact bilaterally with 10g monofilament b/l. Vibratory sensation intact b/l.  Dermatologic Pedal skin with normal turgor, texture and tone bilaterally. No open wounds bilaterally. No interdigital macerations bilaterally. Toenails 1-5 right, L 2nd toe, L 3rd toe and L 4th toe elongated, discolored, dystrophic, thickened, and crumbly with subungual debris and tenderness to dorsal palpation. Anonychia noted L hallux and L 5th toe. Nailbed(s) epithelialized.   Orthopedic: Normal muscle strength 5/5 to all lower extremity muscle groups bilaterally. No pain  crepitus or joint limitation noted with ROM b/l. Limited joint ROM to the left ankle.   Hemoglobin A1C Latest Ref Rng & Units 01/04/2020 07/08/2019  HGBA1C 4.6 - 6.5 % 5.9 5.9  Some recent data might be hidden       Assessment:   1. Pain due to onychomycosis of toenails of both feet   2. Controlled type 2 diabetes mellitus without complication, without long-term current use of insulin (HCC)    Plan:  Patient was evaluated and treated and all questions answered.  Onychomycosis with pain -Nails palliatively debridement as below. -Educated on self-care  Procedure: Nail Debridement Rationale: Pain Type of Debridement: manual, sharp debridement. Instrumentation: Nail nipper, rotary burr. Number of Nails: 8  -Examined patient. -No new findings. No new orders. -Continue diabetic foot care principles. -Patient to continue soft, supportive shoe gear daily. -Toenails 1-5 right, L 2nd toe, L 3rd toe and L 4th toe debrided in length and girth without iatrogenic bleeding with sterile nail nipper and dremel.  -Patient to report any pedal injuries to medical professional immediately. -Patient/POA to call should there be question/concern in the interim.  Return in  about 3 months (around 05/15/2020) for diabetic nail trim.  [name], DPM

## 2020-03-23 ENCOUNTER — Other Ambulatory Visit: Payer: Self-pay | Admitting: Internal Medicine

## 2020-03-23 NOTE — Telephone Encounter (Signed)
Please refill as per office routine med refill policy (all routine meds refilled for 3 mo or monthly per pt preference up to one year from last visit, then month to month grace period for 3 mo, then further med refills will have to be denied)  

## 2020-04-07 ENCOUNTER — Other Ambulatory Visit: Payer: Self-pay

## 2020-04-07 ENCOUNTER — Ambulatory Visit (AMBULATORY_SURGERY_CENTER): Payer: Self-pay

## 2020-04-07 VITALS — Ht 59.0 in | Wt 184.0 lb

## 2020-04-07 DIAGNOSIS — Z1211 Encounter for screening for malignant neoplasm of colon: Secondary | ICD-10-CM

## 2020-04-07 MED ORDER — NA SULFATE-K SULFATE-MG SULF 17.5-3.13-1.6 GM/177ML PO SOLN: 1.0000 | Freq: Once | ORAL | 0 refills | Status: AC

## 2020-04-07 NOTE — Progress Notes (Signed)
No allergies to soy or egg Pt is not on blood thinners or diet pills Denies issues with sedation/intubation Denies atrial flutter/fib Denies constipation   Emmi instructions given to pt  Pt is aware of Covid safety and care partner requirements.  

## 2020-04-18 ENCOUNTER — Other Ambulatory Visit: Payer: Self-pay | Admitting: Internal Medicine

## 2020-04-18 NOTE — Telephone Encounter (Signed)
Please refill as per office routine med refill policy (all routine meds refilled for 3 mo or monthly per pt preference up to one year from last visit, then month to month grace period for 3 mo, then further med refills will have to be denied)  

## 2020-04-21 ENCOUNTER — Encounter: Payer: Self-pay | Admitting: Gastroenterology

## 2020-04-21 ENCOUNTER — Ambulatory Visit (AMBULATORY_SURGERY_CENTER): Payer: Medicare Other | Admitting: Gastroenterology

## 2020-04-21 ENCOUNTER — Other Ambulatory Visit: Payer: Self-pay

## 2020-04-21 VITALS — BP 150/67 | HR 81 | Temp 97.9°F | Resp 27 | Ht 59.0 in | Wt 184.0 lb

## 2020-04-21 DIAGNOSIS — Z1211 Encounter for screening for malignant neoplasm of colon: Secondary | ICD-10-CM | POA: Diagnosis not present

## 2020-04-21 MED ORDER — SODIUM CHLORIDE 0.9 % IV SOLN
500.0000 mL | Freq: Once | INTRAVENOUS | Status: DC
Start: 2020-04-21 — End: 2020-07-12

## 2020-04-21 NOTE — Progress Notes (Signed)
Pt's states no medical or surgical changes since previsit or office visit.   VS taken by CW 

## 2020-04-21 NOTE — Op Note (Signed)
[facility] Patient Name: Brandi Shaffer Procedure Date: 04/21/2020 10:31 AM MRN: [id number] Endoscopist: Meryl Dare , MD Age: 75 Referring MD:  Date of Birth: 1946/01/13 Gender: Female Account #: [id number] Procedure:                Colonoscopy Indications:              Screening for colorectal malignant neoplasm Medicines:                Monitored Anesthesia Care Procedure:                Pre-Anesthesia Assessment:                           - Prior to the procedure, a History and Physical                            was performed, and patient medications and                            allergies were reviewed. The patient's tolerance of                            previous anesthesia was also reviewed. The risks                            and benefits of the procedure and the sedation                            options and risks were discussed with the patient.                            All questions were answered, and informed consent                            was obtained. Prior Anticoagulants: The patient has                            taken no previous anticoagulant or antiplatelet                            agents. ASA Grade Assessment: II - A patient with                            mild systemic disease. After reviewing the risks                            and benefits, the patient was deemed in                            satisfactory condition to undergo the procedure.                           After obtaining informed consent, the colonoscope  was passed under direct vision. Throughout the                            procedure, the patient's blood pressure, pulse, and                            oxygen saturations were monitored continuously. The                            Olympus [id number]) [id number] was introduced                            through the anus and advanced to the the cecum,                            identified by  appendiceal orifice and ileocecal                            valve. The ileocecal valve, appendiceal orifice,                            and rectum were photographed. The quality of the                            bowel preparation was good. The colonoscopy was                            performed without difficulty. The patient tolerated                            the procedure well. Scope In: 10:39:00 AM Scope Out: 10:52:21 AM Scope Withdrawal Time: 0 hours 10 minutes 41 seconds  Total Procedure Duration: 0 hours 13 minutes 21 seconds  Findings:                 The perianal and digital rectal examinations were                            normal.                           Multiple medium-mouthed diverticula were found in                            the sigmoid colon, descending colon and transverse                            colon. There was no evidence of diverticular                            bleeding.                           Internal hemorrhoids were found during  retroflexion. The hemorrhoids were small and Grade                            I (internal hemorrhoids that do not prolapse).                           The exam was otherwise without abnormality on                            direct and retroflexion views. Complications:            No immediate complications. Estimated blood loss:                            None. Estimated Blood Loss:     Estimated blood loss: none. Impression:               - Moderate diverticulosis in the sigmoid colon, in                            the descending colon and in the transverse colon.                           - Internal hemorrhoids.                           - The examination was otherwise normal on direct                            and retroflexion views.                           - No specimens collected. Recommendation:           - Patient has a contact number available for                             emergencies. The signs and symptoms of potential                            delayed complications were discussed with the                            patient. Return to normal activities tomorrow.                            Written discharge instructions were provided to the                            patient.                           - High fiber diet.                           - Continue present medications.                           -  No repeat colonoscopy due to age and the absence                            of colonic polyps. Meryl Dare, MD 04/21/2020 10:56:42 AM This report has been signed electronically.

## 2020-04-21 NOTE — Patient Instructions (Signed)
Handout given:  High fiber diet, diverticulosis Start high fiber diet Continue current medications  YOU HAD AN ENDOSCOPIC PROCEDURE TODAY AT [facility]:   Refer to the procedure report that was given to you for any specific questions about what was found during the examination.  If the procedure report does not answer your questions, please call your gastroenterologist to clarify.  If you requested that your care partner not be given the details of your procedure findings, then the procedure report has been included in a sealed envelope for you to review at your convenience later.  YOU SHOULD EXPECT: Some feelings of bloating in the abdomen. Passage of more gas than usual.  Walking can help get rid of the air that was put into your GI tract during the procedure and reduce the bloating. If you had a lower endoscopy (such as a colonoscopy or flexible sigmoidoscopy) you may notice spotting of blood in your stool or on the toilet paper. If you underwent a bowel prep for your procedure, you may not have a normal bowel movement for a few days.  Please Note:  You might notice some irritation and congestion in your nose or some drainage.  This is from the oxygen used during your procedure.  There is no need for concern and it should clear up in a day or so.  SYMPTOMS TO REPORT IMMEDIATELY:   Following lower endoscopy (colonoscopy or flexible sigmoidoscopy):  Excessive amounts of blood in the stool  Significant tenderness or worsening of abdominal pains  Swelling of the abdomen that is new, acute  Fever of 100F or higher  For urgent or emergent issues, a gastroenterologist can be reached at any hour by calling [phone]. Do not use MyChart messaging for urgent concerns.   DIET:  We do recommend a small meal at first, but then you may proceed to your regular diet.  Drink plenty of fluids but you should avoid alcoholic beverages for 24 hours.  ACTIVITY:  You should plan to take  it easy for the rest of today and you should NOT DRIVE or use heavy machinery until tomorrow (because of the sedation medicines used during the test).    FOLLOW UP: Our staff will call the number listed on your records 48-72 hours following your procedure to check on you and address any questions or concerns that you may have regarding the information given to you following your procedure. If we do not reach you, we will leave a message.  We will attempt to reach you two times.  During this call, we will ask if you have developed any symptoms of COVID 19. If you develop any symptoms (ie: fever, flu-like symptoms, shortness of breath, cough etc.) before then, please call [phone].  If you test positive for Covid 19 in the 2 weeks post procedure, please call and report this information to [place].    If any biopsies were taken you will be contacted by phone or by letter within the next 1-3 weeks.  Please call us at [phone] if you have not heard about the biopsies in 3 weeks.   SIGNATURES/CONFIDENTIALITY: You and/or your care partner have signed paperwork which will be entered into your electronic medical record.  These signatures attest to the fact that that the information above on your After Visit Summary has been reviewed and is understood.  Full responsibility of the confidentiality of this discharge information lies with you and/or your care-partner.

## 2020-04-21 NOTE — Progress Notes (Signed)
Report given to PACU, vss 

## 2020-04-25 ENCOUNTER — Telehealth: Payer: Self-pay

## 2020-04-25 NOTE — Telephone Encounter (Signed)
LVM

## 2020-05-16 ENCOUNTER — Encounter: Payer: Self-pay | Admitting: Podiatry

## 2020-05-16 ENCOUNTER — Other Ambulatory Visit: Payer: Self-pay

## 2020-05-16 ENCOUNTER — Ambulatory Visit (INDEPENDENT_AMBULATORY_CARE_PROVIDER_SITE_OTHER): Payer: Medicare Other | Admitting: Podiatry

## 2020-05-16 DIAGNOSIS — M79675 Pain in left toe(s): Secondary | ICD-10-CM | POA: Diagnosis not present

## 2020-05-16 DIAGNOSIS — E119 Type 2 diabetes mellitus without complications: Secondary | ICD-10-CM

## 2020-05-16 DIAGNOSIS — M79674 Pain in right toe(s): Secondary | ICD-10-CM | POA: Diagnosis not present

## 2020-05-16 DIAGNOSIS — B351 Tinea unguium: Secondary | ICD-10-CM

## 2020-05-21 NOTE — Progress Notes (Signed)
Subjective:  Patient ID: Brandi Shaffer, female    DOB: 1946/01/17,  MRN: [id number]  75 y.o. female presents with preventative diabetic foot care and painful thick toenails that are difficult to trim. Pain interferes with ambulation. Aggravating factors include wearing enclosed shoe gear. Pain is relieved with periodic professional debridement..    Patient's blood sugar was 94 mg/dl yesterday morning.  PCP: Corwin Levins, MD and last visit was: 01/11/2020.  Review of Systems: Negative except as noted in the HPI.   She voices no new problems on today's visit.  Allergies  Allergen Reactions  . Januvia [Sitagliptin Phosphate]     dizziness  . Metformin And Related Nausea Only  . Doxycycline Rash   Objective:  There were no vitals filed for this visit. Constitutional Patient is a pleasant 75 y.o. African American female in NAD. AAO x 3.  Vascular Capillary refill time to digits immediate b/l. Palpable pedal pulses b/l LE. Pedal hair present. Lower extremity skin temperature gradient within normal limits. No cyanosis or clubbing noted.  Neurologic Normal speech. Protective sensation intact 5/5 intact bilaterally with 10g monofilament b/l. Vibratory sensation intact b/l.  Dermatologic Pedal skin with normal turgor, texture and tone bilaterally. No open wounds bilaterally. No interdigital macerations bilaterally. Toenails 1-5 right, L 2nd toe, L 3rd toe and L 4th toe elongated, discolored, dystrophic, thickened, and crumbly with subungual debris and tenderness to dorsal palpation. Anonychia noted L hallux and L 5th toe. Nailbed(s) epithelialized.   Orthopedic: Normal muscle strength 5/5 to all lower extremity muscle groups bilaterally. No pain crepitus or joint limitation noted with ROM b/l. Limited joint ROM to the left ankle.   Hemoglobin A1C Latest Ref Rng & Units 01/04/2020 07/08/2019  HGBA1C 4.6 - 6.5 % 5.9 5.9  Some recent data might be hidden   Assessment:   1. Pain due to  onychomycosis of toenails of both feet   2. Controlled type 2 diabetes mellitus without complication, without long-term current use of insulin (HCC)    Plan:  Patient was evaluated and treated and all questions answered.  Onychomycosis with pain -Nails palliatively debridement as below. -Educated on self-care  Procedure: Nail Debridement Rationale: Pain Type of Debridement: manual, sharp debridement. Instrumentation: Nail nipper, rotary burr. Number of Nails: 8  -Examined patient. -No new findings. No new orders. -Continue diabetic foot care principles. -Patient to continue soft, supportive shoe gear daily. -Toenails 1-5 right, L 2nd toe, L 3rd toe and L 4th toe debrided in length and girth without iatrogenic bleeding with sterile nail nipper and dremel.  -Patient to report any pedal injuries to medical professional immediately. -Patient/POA to call should there be question/concern in the interim.  Return in about 3 months (around 08/16/2020).  Brandi Shaffer, DPM

## 2020-06-20 ENCOUNTER — Other Ambulatory Visit: Payer: Self-pay | Admitting: Internal Medicine

## 2020-07-11 ENCOUNTER — Ambulatory Visit: Payer: Medicare Other | Admitting: Internal Medicine

## 2020-07-12 ENCOUNTER — Encounter: Payer: Self-pay | Admitting: Internal Medicine

## 2020-07-12 ENCOUNTER — Other Ambulatory Visit: Payer: Self-pay

## 2020-07-12 ENCOUNTER — Ambulatory Visit (INDEPENDENT_AMBULATORY_CARE_PROVIDER_SITE_OTHER): Payer: Medicare Other | Admitting: Internal Medicine

## 2020-07-12 ENCOUNTER — Other Ambulatory Visit: Payer: Self-pay | Admitting: Internal Medicine

## 2020-07-12 VITALS — BP 128/80 | HR 74 | Temp 98.1°F | Ht 59.0 in | Wt 185.0 lb

## 2020-07-12 DIAGNOSIS — E1165 Type 2 diabetes mellitus with hyperglycemia: Secondary | ICD-10-CM | POA: Diagnosis not present

## 2020-07-12 DIAGNOSIS — I10 Essential (primary) hypertension: Secondary | ICD-10-CM | POA: Diagnosis not present

## 2020-07-12 DIAGNOSIS — Z0001 Encounter for general adult medical examination with abnormal findings: Secondary | ICD-10-CM

## 2020-07-12 DIAGNOSIS — E538 Deficiency of other specified B group vitamins: Secondary | ICD-10-CM | POA: Diagnosis not present

## 2020-07-12 DIAGNOSIS — R350 Frequency of micturition: Secondary | ICD-10-CM

## 2020-07-12 DIAGNOSIS — E559 Vitamin D deficiency, unspecified: Secondary | ICD-10-CM

## 2020-07-12 LAB — URINALYSIS, ROUTINE W REFLEX MICROSCOPIC
Bilirubin Urine: NEGATIVE
Hgb urine dipstick: NEGATIVE
Ketones, ur: NEGATIVE
Leukocytes,Ua: NEGATIVE
Nitrite: NEGATIVE
Specific Gravity, Urine: 1.01 (ref 1.000–1.030)
Total Protein, Urine: NEGATIVE
Urine Glucose: NEGATIVE
Urobilinogen, UA: 0.2 (ref 0.0–1.0)
pH: 7 (ref 5.0–8.0)

## 2020-07-12 LAB — BASIC METABOLIC PANEL
BUN: 14 mg/dL (ref 6–23)
CO2: 30 mEq/L (ref 19–32)
Calcium: 9.6 mg/dL (ref 8.4–10.5)
Chloride: 106 mEq/L (ref 96–112)
Creatinine, Ser: 0.85 mg/dL (ref 0.40–1.20)
GFR: 66.95 mL/min (ref >60.00)
Glucose, Bld: 93 mg/dL (ref 70–99)
Potassium: 4.2 mEq/L (ref 3.5–5.1)
Sodium: 143 mEq/L (ref 135–145)

## 2020-07-12 LAB — LIPID PANEL
Cholesterol: 211 mg/dL — ABNORMAL HIGH (ref 0–200)
HDL: 63.9 mg/dL (ref >39.00)
LDL Cholesterol: 133 mg/dL — ABNORMAL HIGH (ref 0–99)
NonHDL: 147.18
Total CHOL/HDL Ratio: 3
Triglycerides: 72 mg/dL (ref 0.0–149.0)
VLDL: 14.4 mg/dL (ref 0.0–40.0)

## 2020-07-12 LAB — CBC WITH DIFFERENTIAL/PLATELET
Basophils Absolute: 0 10*3/uL (ref 0.0–0.1)
Basophils Relative: 0.7 % (ref 0.0–3.0)
Eosinophils Absolute: 0.2 10*3/uL (ref 0.0–0.7)
Eosinophils Relative: 3.2 % (ref 0.0–5.0)
HCT: 42.2 % (ref 36.0–46.0)
Hemoglobin: 14.2 g/dL (ref 12.0–15.0)
Lymphocytes Relative: 47.7 % — ABNORMAL HIGH (ref 12.0–46.0)
Lymphs Abs: 3.1 10*3/uL (ref 0.7–4.0)
MCHC: 33.6 g/dL (ref 30.0–36.0)
MCV: 92.3 fl (ref 78.0–100.0)
Monocytes Absolute: 0.6 10*3/uL (ref 0.1–1.0)
Monocytes Relative: 9.3 % (ref 3.0–12.0)
Neutro Abs: 2.6 10*3/uL (ref 1.4–7.7)
Neutrophils Relative %: 39.1 % — ABNORMAL LOW (ref 43.0–77.0)
Platelets: 279 10*3/uL (ref 150.0–400.0)
RBC: 4.58 Mil/uL (ref 3.87–5.11)
RDW: 14.7 % (ref 11.5–15.5)
WBC: 6.6 10*3/uL (ref 4.0–10.5)

## 2020-07-12 LAB — HEPATIC FUNCTION PANEL
ALT: 12 U/L (ref 0–35)
AST: 15 U/L (ref 0–37)
Albumin: 4.4 g/dL (ref 3.5–5.2)
Alkaline Phosphatase: 120 U/L — ABNORMAL HIGH (ref 39–117)
Bilirubin, Direct: 0.1 mg/dL (ref 0.0–0.3)
Total Bilirubin: 0.6 mg/dL (ref 0.2–1.2)
Total Protein: 7.3 g/dL (ref 6.0–8.3)

## 2020-07-12 LAB — MICROALBUMIN / CREATININE URINE RATIO
Creatinine,U: 24.2 mg/dL
Microalb Creat Ratio: 2.9 mg/g (ref 0.0–30.0)
Microalb, Ur: <0.7 mg/dL (ref 0.0–1.9)

## 2020-07-12 LAB — VITAMIN D 25 HYDROXY (VIT D DEFICIENCY, FRACTURES): VITD: 20.22 ng/mL — ABNORMAL LOW (ref 30.00–100.00)

## 2020-07-12 LAB — HEMOGLOBIN A1C: Hgb A1c MFr Bld: 5.9 % (ref 4.6–6.5)

## 2020-07-12 LAB — VITAMIN B12: Vitamin B-12: 1092 pg/mL — ABNORMAL HIGH (ref 211–911)

## 2020-07-12 LAB — TSH: TSH: 2.01 u[IU]/mL (ref 0.35–4.50)

## 2020-07-12 MED ORDER — ATORVASTATIN CALCIUM 80 MG PO TABS: 80.0000 mg | ORAL_TABLET | Freq: Every day | ORAL | 3 refills | Status: DC

## 2020-07-12 MED ORDER — LOSARTAN POTASSIUM 25 MG PO TABS: 25.0000 mg | ORAL_TABLET | Freq: Every day | ORAL | 3 refills | Status: DC

## 2020-07-12 NOTE — Progress Notes (Signed)
Patient ID: Brandi Shaffer, female   DOB: Jun 24, 1945, 75 y.o.   MRN: [id number]         Chief Complaint:: wellness exam and Follow-up (6 month f/u)  urinary freq, dizziness, dm, htn, b12 deficiency       HPI:  Brandi Shaffer is a 75 y.o. female here for wellness exam; due for yearly eye exam, o/w up to date with preventive referrals and immunizations.                          Also taking B12 supplement.  Pt denies chest pain, increased sob or doe, wheezing, orthopnea, PND, increased LE swelling, palpitations, or syncope, but has had dizziness, and is asking for less losartan dose.   Pt denies polydipsia, polyuria, or new focal neuro s/s.  Does have some urinary frequency in the past week but Denies urinary symptoms such as dysuria, frequency, urgency, flank pain, hematuria or n/v, fever, chills.   Pt denies fever, wt loss, night sweats, loss of appetite, or other constitutional symptoms  No other new complaints Wt Readings from Last 3 Encounters:  07/12/20 185 lb (83.9 kg)  04/21/20 184 lb (83.5 kg)  04/07/20 184 lb (83.5 kg)   BP Readings from Last 3 Encounters:  07/12/20 128/80  04/21/20 (!) 150/67  02/02/20 130/80   Immunization History  Administered Date(s) Administered  . Fluad Quad(high Dose 65+) 01/06/2019  . Influenza Split 11/21/2010, 12/05/2011  . Influenza Whole 12/06/2009  . Influenza, High Dose Seasonal PF 12/04/2012, 12/04/2013, 11/14/2016, 11/15/2017  . Influenza,inj,Quad PF,6+ Mos 12/15/2014  . PFIZER(Purple Top)SARS-COV-2 Vaccination 05/23/2019, 05/27/2019, 06/13/2019  . Pneumococcal Conjugate-13 12/19/2012  . Pneumococcal Polysaccharide-23 11/27/2004, 12/06/2009, 06/25/2017  . Td 11/27/2004, 06/08/2014   Health Maintenance Due  Topic Date Due  . OPHTHALMOLOGY EXAM  06/10/2020      Past Medical History:  Diagnosis Date  . ANEMIA-IRON DEFICIENCY 12/06/2009  . Anxiety state 07/01/2015  . Asthma 12/05/2011  . DIABETES MELLITUS, TYPE II 09/17/2006  .  DIVERTICULOSIS, COLON 12/06/2009  . GENITAL HERPES 03/24/2010  . GERD (gastroesophageal reflux disease) 06/28/2011  . HYPERLIPIDEMIA 03/24/2010  . HYPERTENSION 09/17/2006  . HYPOTHYROIDISM 09/17/2006  . LIPOMA 12/06/2009  . PERIPHERAL EDEMA 03/24/2010  . Renal cyst 07/05/2011   1.9 cm minimally complex   Past Surgical History:  Procedure Laterality Date  . ABDOMINAL HYSTERECTOMY     fibroids  . ankle surgury     x 3 left - chronic pain/swelling  . COLONOSCOPY  2011  . SHOULDER SURGERY Left   . TONSILLECTOMY    . TUBAL LIGATION      reports that she has never smoked. She has never used smokeless tobacco. She reports that she does not drink alcohol and does not use drugs. family history includes Breast cancer (age of onset: 70) in her sister; Diabetes in an other family member; Heart disease in her sister; Hypertension in her father and mother; Joint hypermobility in an other family member; Prostate cancer in her brother; Stroke in her sister. Allergies  Allergen Reactions  . Januvia [Sitagliptin Phosphate]     dizziness  . Metformin And Related Nausea Only  . Doxycycline Rash   Current Outpatient Medications on File Prior to Visit  Medication Sig Dispense Refill  . albuterol (VENTOLIN HFA) 108 (90 Base) MCG/ACT inhaler INHALE 2 PUFFS BY MOUTH EVERY 6 HOURS AS NEEDED FOR WHEEZING 8.5 each 5  . amLODipine (NORVASC) 10 MG tablet Take 1 tablet (  10 mg total) by mouth daily. 90 tablet 3  . aspirin 81 MG EC tablet TAKE 1 TABLET BY MOUTH DAILY. SWALLOW WHOLE. 30 tablet 11  . Cholecalciferol (VITAMIN D) 400 UNITS tablet Take 400 Units by mouth daily.    . clotrimazole-betamethasone (LOTRISONE) cream Apply to both feet and between toes bid x 4 weeks. 45 g 1  . furosemide (LASIX) 20 MG tablet TAKE 1 TABLET BY MOUTH EVERY DAY 90 tablet 3  . Misc Natural Products (OSTEO BI-FLEX TRIPLE STRENGTH PO) Take by mouth.    . Multiple Vitamins-Minerals (WOMENS MULTIVITAMIN PO) Take 1 tablet by mouth daily.     Letta Pate DELICA LANCETS 33G MISC 1 each by Does not apply route 2 (two) times daily. Use to help check blood sugars twice a day Dx E11.9 200 each 11  . ONETOUCH VERIO test strip USE 2 (TWO) TIMES DAILY. DX E11.9 100 strip 4  . pioglitazone (ACTOS) 15 MG tablet TAKE 1 TABLET BY MOUTH EVERY DAY 90 tablet 1  . potassium chloride (KLOR-CON) 10 MEQ tablet TAKE 1 TABLET BY MOUTH EVERY DAY 90 tablet 2   No current facility-administered medications on file prior to visit.        ROS:  All others reviewed and negative.  Objective        PE:  BP 128/80 (BP Location: Right Arm, Patient Position: Sitting, Cuff Size: Large)   Pulse 74   Temp 98.1 F (36.7 C) (Oral)   Ht 4\' 11"  (1.499 m)   Wt 185 lb (83.9 kg)   SpO2 98%   BMI 37.37 kg/m                 Constitutional: Pt appears in NAD               HENT: Head: NCAT.                Right Ear: External ear normal.                 Left Ear: External ear normal.                Eyes: . Pupils are equal, round, and reactive to light. Conjunctivae and EOM are normal               Nose: without d/c or deformity               Neck: Neck supple. Gross normal ROM               Cardiovascular: Normal rate and regular rhythm.                 Pulmonary/Chest: Effort normal and breath sounds without rales or wheezing.                Abd:  Soft, NT, ND, + BS, no organomegaly               Neurological: Pt is alert. At baseline orientation, motor grossly intact               Skin: Skin is warm. No rashes, no other new lesions, LE edema - none               Psychiatric: Pt behavior is normal without agitation   Micro: none  Cardiac tracings I have personally interpreted today:  none  Pertinent Radiological findings (summarize): none   Lab Results  Component Value Date   WBC 6.6 07/12/2020  HGB 14.2 07/12/2020   HCT 42.2 07/12/2020   PLT 279.0 07/12/2020   GLUCOSE 93 07/12/2020   CHOL 211 (H) 07/12/2020   TRIG 72.0 07/12/2020   HDL 63.90  07/12/2020   LDLDIRECT 134.6 05/23/2010   LDLCALC 133 (H) 07/12/2020   ALT 12 07/12/2020   AST 15 07/12/2020   NA 143 07/12/2020   K 4.2 07/12/2020   CL 106 07/12/2020   CREATININE 0.85 07/12/2020   BUN 14 07/12/2020   CO2 30 07/12/2020   TSH 2.01 07/12/2020   HGBA1C 5.9 07/12/2020   MICROALBUR <0.7 07/12/2020   Assessment/Plan:  Brandi Shaffer is a 75 y.o. Black or African American [2] female with  has a past medical history of ANEMIA-IRON DEFICIENCY (12/06/2009), Anxiety state (07/01/2015), Asthma (12/05/2011), DIABETES MELLITUS, TYPE II (09/17/2006), DIVERTICULOSIS, COLON (12/06/2009), GENITAL HERPES (03/24/2010), GERD (gastroesophageal reflux disease) (06/28/2011), HYPERLIPIDEMIA (03/24/2010), HYPERTENSION (09/17/2006), HYPOTHYROIDISM (09/17/2006), LIPOMA (12/06/2009), PERIPHERAL EDEMA (03/24/2010), and Renal cyst (07/05/2011).  Encounter for well adult exam with abnormal findings Age and sex appropriate education and counseling updated with regular exercise and diet Referrals for preventative services - for eye exam referral Immunizations addressed - none needed Smoking counseling  - none needed Evidence for depression or other mood disorder - none significant Most recent labs reviewed. I have personally reviewed and have noted: 1) the patient's medical and social history 2) The patient's current medications and supplements 3) The patient's height, weight, and BMI have been recorded in the chart   Vitamin D deficiency Last vitamin D Lab Results  Component Value Date   VD25OH 20.22 (L) 07/12/2020   Low, to start oral replacement  Urinary frequency Etiology unclear, for urinary studies,  to f/u any worsening symptoms or concerns  Essential hypertension BP Readings from Last 3 Encounters:  07/12/20 128/80  04/21/20 (!) 150/67  02/02/20 130/80   Stable, pt to continue medical treatment except with recent dizziness ok to decrease the losartan to 25 mg, cont all other tx - norvasc,  lasix   Current Outpatient Medications (Endocrine & Metabolic):  .  pioglitazone (ACTOS) 15 MG tablet, TAKE 1 TABLET BY MOUTH EVERY DAY  Current Outpatient Medications (Cardiovascular):  .  amLODipine (NORVASC) 10 MG tablet, Take 1 tablet (10 mg total) by mouth daily. .  furosemide (LASIX) 20 MG tablet, TAKE 1 TABLET BY MOUTH EVERY DAY .  losartan (COZAAR) 25 MG tablet, Take 1 tablet (25 mg total) by mouth daily. Marland Kitchen  atorvastatin (LIPITOR) 80 MG tablet, Take 1 tablet (80 mg total) by mouth daily.  Current Outpatient Medications (Respiratory):  .  albuterol (VENTOLIN HFA) 108 (90 Base) MCG/ACT inhaler, INHALE 2 PUFFS BY MOUTH EVERY 6 HOURS AS NEEDED FOR WHEEZING  Current Outpatient Medications (Analgesics):  .  aspirin 81 MG EC tablet, TAKE 1 TABLET BY MOUTH DAILY. SWALLOW WHOLE.   Current Outpatient Medications (Other):  Marland Kitchen  Cholecalciferol (VITAMIN D) 400 UNITS tablet, Take 400 Units by mouth daily. .  clotrimazole-betamethasone (LOTRISONE) cream, Apply to both feet and between toes bid x 4 weeks. .  Misc Natural Products (OSTEO BI-FLEX TRIPLE STRENGTH PO), Take by mouth. .  Multiple Vitamins-Minerals (WOMENS MULTIVITAMIN PO), Take 1 tablet by mouth daily. Letta Pate DELICA LANCETS 33G MISC, 1 each by Does not apply route 2 (two) times daily. Use to help check blood sugars twice a day Dx E11.9 .  ONETOUCH VERIO test strip, USE 2 (TWO) TIMES DAILY. DX E11.9 .  potassium chloride (KLOR-CON) 10 MEQ tablet, TAKE 1  TABLET BY MOUTH EVERY DAY    Diabetes Lab Results  Component Value Date   HGBA1C 5.9 07/12/2020   Stable, pt to continue current medical treatment  - actos   B12 deficiency Lab Results  Component Value Date   VITAMINB12 1,092 (H) 07/12/2020   Stable, cont oral replacement - b12 1000 mcg qd   Followup: Return in about 6 months (around 01/12/2021).  Oliver Barre, MD 07/16/2020 5:31 PM [facility]

## 2020-07-12 NOTE — Patient Instructions (Signed)
Ok to continue the losartan at 25 mg per day  Please continue all other medications as before, and refills have been done if requested.  Please have the pharmacy call with any other refills you may need.  Please continue your efforts at being more active, low cholesterol diet, and weight control.  You are otherwise up to date with prevention measures today.  Please keep your appointments with your specialists as you may have planned  You will be contacted regarding the referral for: eye doctor  Please go to the LAB at the blood drawing area for the tests to be done  You will be contacted by phone if any changes need to be made immediately.  Otherwise, you will receive a letter about your results with an explanation, but please check with MyChart first.  Please remember to sign up for MyChart if you have not done so, as this will be important to you in the future with finding out test results, communicating by private email, and scheduling acute appointments online when needed.  Please make an Appointment to return in 6 months, or sooner if needed

## 2020-07-13 ENCOUNTER — Encounter: Payer: Self-pay | Admitting: Internal Medicine

## 2020-07-16 ENCOUNTER — Encounter: Payer: Self-pay | Admitting: Internal Medicine

## 2020-07-16 NOTE — Assessment & Plan Note (Signed)
Last vitamin D Lab Results  Component Value Date   VD25OH 20.22 (L) 07/12/2020   Low, to start oral replacement

## 2020-07-16 NOTE — Assessment & Plan Note (Signed)
Lab Results  Component Value Date   HGBA1C 5.9 07/12/2020   Stable, pt to continue current medical treatment  - actos

## 2020-07-16 NOTE — Assessment & Plan Note (Signed)
Etiology unclear, for urinary studies,  to f/u any worsening symptoms or concerns

## 2020-07-16 NOTE — Assessment & Plan Note (Signed)
Age and sex appropriate education and counseling updated with regular exercise and diet Referrals for preventative services - for eye exam referral Immunizations addressed - none needed Smoking counseling  - none needed Evidence for depression or other mood disorder - none significant Most recent labs reviewed. I have personally reviewed and have noted: 1) the patient's medical and social history 2) The patient's current medications and supplements 3) The patient's height, weight, and BMI have been recorded in the chart  

## 2020-07-16 NOTE — Assessment & Plan Note (Signed)
BP Readings from Last 3 Encounters:  07/12/20 128/80  04/21/20 (!) 150/67  02/02/20 130/80   Stable, pt to continue medical treatment except with recent dizziness ok to decrease the losartan to 25 mg, cont all other tx - norvasc, lasix   Current Outpatient Medications (Endocrine & Metabolic):  .  pioglitazone (ACTOS) 15 MG tablet, TAKE 1 TABLET BY MOUTH EVERY DAY  Current Outpatient Medications (Cardiovascular):  .  amLODipine (NORVASC) 10 MG tablet, Take 1 tablet (10 mg total) by mouth daily. .  furosemide (LASIX) 20 MG tablet, TAKE 1 TABLET BY MOUTH EVERY DAY .  losartan (COZAAR) 25 MG tablet, Take 1 tablet (25 mg total) by mouth daily. Marland Kitchen  atorvastatin (LIPITOR) 80 MG tablet, Take 1 tablet (80 mg total) by mouth daily.  Current Outpatient Medications (Respiratory):  .  albuterol (VENTOLIN HFA) 108 (90 Base) MCG/ACT inhaler, INHALE 2 PUFFS BY MOUTH EVERY 6 HOURS AS NEEDED FOR WHEEZING  Current Outpatient Medications (Analgesics):  .  aspirin 81 MG EC tablet, TAKE 1 TABLET BY MOUTH DAILY. SWALLOW WHOLE.   Current Outpatient Medications (Other):  Marland Kitchen  Cholecalciferol (VITAMIN D) 400 UNITS tablet, Take 400 Units by mouth daily. .  clotrimazole-betamethasone (LOTRISONE) cream, Apply to both feet and between toes bid x 4 weeks. .  Misc Natural Products (OSTEO BI-FLEX TRIPLE STRENGTH PO), Take by mouth. .  Multiple Vitamins-Minerals (WOMENS MULTIVITAMIN PO), Take 1 tablet by mouth daily. Glory Rosebush DELICA LANCETS 56L MISC, 1 each by Does not apply route 2 (two) times daily. Use to help check blood sugars twice a day Dx E11.9 .  ONETOUCH VERIO test strip, USE 2 (TWO) TIMES DAILY. DX E11.9 .  potassium chloride (KLOR-CON) 10 MEQ tablet, TAKE 1 TABLET BY MOUTH EVERY DAY

## 2020-07-16 NOTE — Assessment & Plan Note (Signed)
Lab Results  ?Component Value Date  ? VITAMINB12 1,092 (H) 07/12/2020  ? ?Stable, cont oral replacement - b12 1000 mcg qd ? ?

## 2020-07-18 NOTE — Telephone Encounter (Signed)
° ° °  Please return call to patient °

## 2020-08-04 ENCOUNTER — Telehealth: Payer: Self-pay | Admitting: Internal Medicine

## 2020-08-04 DIAGNOSIS — E1165 Type 2 diabetes mellitus with hyperglycemia: Secondary | ICD-10-CM

## 2020-08-04 NOTE — Telephone Encounter (Signed)
Patient is requesting a referral to an eye doctor. Please advise

## 2020-08-05 NOTE — Telephone Encounter (Signed)
Patient notified

## 2020-08-08 ENCOUNTER — Ambulatory Visit: Payer: Medicare Other | Admitting: Internal Medicine

## 2020-08-08 ENCOUNTER — Other Ambulatory Visit: Payer: Self-pay

## 2020-08-08 NOTE — Progress Notes (Signed)
[facility] [address] [place] Phone: [phone] Subjective:   I Brandi Shaffer am serving as a Education administrator for Dr. Hulan Saas.  This visit occurred during the SARS-CoV-2 public health emergency.  Safety protocols were in place, including screening questions prior to the visit, additional usage of staff PPE, and extensive cleaning of exam room while observing appropriate contact time as indicated for disinfecting solutions.   I'm seeing this patient by the request  of:  Biagio Borg, MD  CC: hip pain   [id number]  Brandi Shaffer is a 75 y.o. female coming in with complaint of hip pain. Last seen in 2020 for foot pain. History of greater trochanteric bursitis, L. Patient states her right hip is painful and that she does a lot. Pain is lateral and medial. Posterior hip/lower back pain. States she walks a lot and does a lot on her feet. States she also tries to exercise on her treadmill. Has tried multiple modalities and they have not worked. States the pain causes her to limp sometimes.      Past Medical History:  Diagnosis Date   ANEMIA-IRON DEFICIENCY 12/06/2009   Anxiety state 07/01/2015   Asthma 12/05/2011   DIABETES MELLITUS, TYPE II 09/17/2006   DIVERTICULOSIS, COLON 12/06/2009   GENITAL HERPES 03/24/2010   GERD (gastroesophageal reflux disease) 06/28/2011   HYPERLIPIDEMIA 03/24/2010   HYPERTENSION 09/17/2006   HYPOTHYROIDISM 09/17/2006   LIPOMA 12/06/2009   PERIPHERAL EDEMA 03/24/2010   Renal cyst 07/05/2011   1.9 cm minimally complex   Past Surgical History:  Procedure Laterality Date   ABDOMINAL HYSTERECTOMY     fibroids   ankle surgury     x 3 left - chronic pain/swelling   COLONOSCOPY  2011   SHOULDER SURGERY Left    TONSILLECTOMY     TUBAL LIGATION     Social History   Socioeconomic History   Marital status: Divorced    Spouse name: Not on file   Number of children: 7   Years of education: Not on file   Highest  education level: Not on file  Occupational History   Occupation: CASHIER/ retired    Comment: gas station, stands for work  Tobacco Use   Smoking status: Never   Smokeless tobacco: Never  Vaping Use   Vaping Use: Never used  Substance and Sexual Activity   Alcohol use: No    Alcohol/week: 0.0 standard drinks   Drug use: No   Sexual activity: Not Currently  Other Topics Concern   Not on file  Social History Narrative   Lives with grandson and and pets;    Have 6 sons and one dtr   Have grand- children; running children through out the day and cooking   Social Determinants of Radio broadcast assistant Strain: Low Risk    Difficulty of Paying Living Expenses: Not hard at all  Food Insecurity: No Food Insecurity   Worried About Charity fundraiser in the Last Year: Never true   Arboriculturist in the Last Year: Never true  Transportation Needs: No Transportation Needs   Lack of Transportation (Medical): No   Lack of Transportation (Non-Medical): No  Physical Activity: Sufficiently Active   Days of Exercise per Week: 5 days   Minutes of Exercise per Session: 30 min  Stress: No Stress Concern Present   Feeling of Stress : Not at all  Social Connections: Moderately Integrated   Frequency of Communication  with Friends and Family: More than three times a week   Frequency of Social Gatherings with Friends and Family: More than three times a week   Attends Religious Services: More than 4 times per year   Active Member of Clubs or Organizations: No   Attends Music therapist: More than 4 times per year   Marital Status: Widowed   Allergies  Allergen Reactions   Januvia [Sitagliptin Phosphate]     dizziness   Metformin And Related Nausea Only   Doxycycline Rash   Family History  Problem Relation Age of Onset   Hypertension Mother    Hypertension Father    Heart disease Sister        sudden death early 76's   Stroke Sister    Breast cancer Sister 48    Prostate cancer Brother    Diabetes Other    Joint hypermobility Other        DJD   Colon cancer Neg Hx    Colon polyps Neg Hx    Esophageal cancer Neg Hx    Stomach cancer Neg Hx    Rectal cancer Neg Hx     Current Outpatient Medications (Endocrine & Metabolic):    pioglitazone (ACTOS) 15 MG tablet, TAKE 1 TABLET BY MOUTH EVERY DAY  Current Outpatient Medications (Cardiovascular):    amLODipine (NORVASC) 10 MG tablet, Take 1 tablet (10 mg total) by mouth daily.   atorvastatin (LIPITOR) 80 MG tablet, Take 1 tablet (80 mg total) by mouth daily.   furosemide (LASIX) 20 MG tablet, TAKE 1 TABLET BY MOUTH EVERY DAY   losartan (COZAAR) 25 MG tablet, Take 1 tablet (25 mg total) by mouth daily.  Current Outpatient Medications (Respiratory):    albuterol (VENTOLIN HFA) 108 (90 Base) MCG/ACT inhaler, INHALE 2 PUFFS BY MOUTH EVERY 6 HOURS AS NEEDED FOR WHEEZING  Current Outpatient Medications (Analgesics):    aspirin 81 MG EC tablet, TAKE 1 TABLET BY MOUTH DAILY. SWALLOW WHOLE.   Current Outpatient Medications (Other):    Cholecalciferol (VITAMIN D) 400 UNITS tablet, Take 400 Units by mouth daily.   clotrimazole-betamethasone (LOTRISONE) cream, Apply to both feet and between toes bid x 4 weeks.   Misc Natural Products (OSTEO BI-FLEX TRIPLE STRENGTH PO), Take by mouth.   Multiple Vitamins-Minerals (WOMENS MULTIVITAMIN PO), Take 1 tablet by mouth daily.   ONETOUCH DELICA LANCETS 34J MISC, 1 each by Does not apply route 2 (two) times daily. Use to help check blood sugars twice a day Dx E11.9   ONETOUCH VERIO test strip, USE 2 (TWO) TIMES DAILY. DX E11.9   potassium chloride (KLOR-CON) 10 MEQ tablet, TAKE 1 TABLET BY MOUTH EVERY DAY   Reviewed prior external information including notes and imaging from  primary care provider As well as notes that were available from care everywhere and other healthcare systems.  Past medical history, social, surgical and family history all reviewed in  electronic medical record.  No pertanent information unless stated regarding to the chief complaint.   Review of Systems:  No headache, visual changes, nausea, vomiting, diarrhea, constipation, dizziness, abdominal pain, skin rash, fevers, chills, night sweats, weight loss, swollen lymph nodes joint swelling, chest pain, shortness of breath, mood changes. POSITIVE muscle aches, body aches  Objective  There were no vitals taken for this visit.   General: No apparent distress alert and oriented x3 mood and affect normal, dressed appropriately.  HEENT: Pupils equal, extraocular movements intact  Respiratory: Patient's speak in full sentences and does  not appear short of breath  Cardiovascular: No lower extremity edema, non tender, no erythema  Gait mild antalgic favoring the right hip Tender to palpation over the greater trochanteric area on the right side.  Patient does have fairly good range of motion of the hip still noted.  Negative straight leg test but does have some mild pain in the paraspinal musculature of the lumbar spine.   Procedure: Real-time Ultrasound Guided Injection of right greater trochanteric bursitis secondary to patient's body habitus Device: GE Logiq Q7 Ultrasound guided injection is preferred based studies that show increased duration, increased effect, greater accuracy, decreased procedural pain, increased response rate, and decreased cost with ultrasound guided versus blind injection.  Verbal informed consent obtained.  Time-out conducted.  Noted no overlying erythema, induration, or other signs of local infection.  Skin prepped in a sterile fashion.  Local anesthesia: Topical Ethyl chloride.  With sterile technique and under real time ultrasound guidance:  Greater trochanteric area was visualized and patient's bursa was noted. A 22-gauge 3 inch needle was inserted and 4 cc of 0.5% Marcaine and 1 cc of Kenalog 40 mg/dL was injected. Pictures taken Completed without  difficulty  Pain immediately resolved suggesting accurate placement of the medication.  Advised to call if fevers/chills, erythema, induration, drainage, or persistent bleeding.  Impression: Technically successful ultrasound guided injection.  [id number]; 15 additional minutes spent for Therapeutic exercises as stated in above notes.  This included exercises focusing on stretching, strengthening, with significant focus on eccentric aspects.   Long term goals include an improvement in range of motion, strength, endurance as well as avoiding reinjury. Patient's frequency would include in 1-2 times a day, 3-5 times a week for a duration of 6-12 weeks.  Hip strengthening exercises which included:  Pelvic tilt/bracing to help with proper recruitment of the lower abs and pelvic floor muscles  Glute strengthening to properly contract glutes without over-engaging low back and hamstrings - prone hip extension and glute bridge exercises Proper stretching techniques to increase effectiveness for the hip flexors, groin, quads, piriformic and low back when appropriate  Proper technique shown and discussed handout in great detail with ATC.  All questions were discussed and answered.       Impression and Recommendations:     The above documentation has been reviewed and is accurate and complete Lyndal Pulley, DO

## 2020-08-09 ENCOUNTER — Ambulatory Visit: Payer: Self-pay

## 2020-08-09 ENCOUNTER — Encounter: Payer: Self-pay | Admitting: Family Medicine

## 2020-08-09 ENCOUNTER — Ambulatory Visit (INDEPENDENT_AMBULATORY_CARE_PROVIDER_SITE_OTHER): Payer: Medicare Other

## 2020-08-09 ENCOUNTER — Ambulatory Visit (INDEPENDENT_AMBULATORY_CARE_PROVIDER_SITE_OTHER): Payer: Medicare Other | Admitting: Family Medicine

## 2020-08-09 ENCOUNTER — Encounter: Payer: Self-pay | Admitting: Internal Medicine

## 2020-08-09 VITALS — BP 150/94 | HR 90 | Ht 59.0 in | Wt 189.0 lb

## 2020-08-09 DIAGNOSIS — M7061 Trochanteric bursitis, right hip: Secondary | ICD-10-CM | POA: Diagnosis not present

## 2020-08-09 DIAGNOSIS — M545 Low back pain, unspecified: Secondary | ICD-10-CM | POA: Diagnosis not present

## 2020-08-09 DIAGNOSIS — M25551 Pain in right hip: Secondary | ICD-10-CM

## 2020-08-09 HISTORY — DX: Trochanteric bursitis, right hip: M70.61

## 2020-08-09 NOTE — Patient Instructions (Addendum)
Good to see you Xray today Injection in the hip today Exercise 3 times a week See me again in 6-8 weeks

## 2020-08-09 NOTE — Assessment & Plan Note (Signed)
Patient given injection today and tolerated the procedure well, discussed icing regimen and home exercises.  Work with Product/process development scientist.  We will get x-rays to further evaluate but do not feel the bony abnormalities would be likely contributing.  We will get a lumbar x-ray as well to ensure no lumbar radiculopathy.  Patient has been doing very well and we will have her follow-up again within 2 months

## 2020-08-14 ENCOUNTER — Other Ambulatory Visit: Payer: Self-pay | Admitting: Internal Medicine

## 2020-08-30 ENCOUNTER — Other Ambulatory Visit: Payer: Self-pay

## 2020-08-30 ENCOUNTER — Encounter: Payer: Self-pay | Admitting: Podiatry

## 2020-08-30 ENCOUNTER — Ambulatory Visit (INDEPENDENT_AMBULATORY_CARE_PROVIDER_SITE_OTHER): Payer: Medicare Other | Admitting: Podiatry

## 2020-08-30 DIAGNOSIS — E119 Type 2 diabetes mellitus without complications: Secondary | ICD-10-CM | POA: Diagnosis not present

## 2020-08-30 DIAGNOSIS — M79675 Pain in left toe(s): Secondary | ICD-10-CM

## 2020-08-30 DIAGNOSIS — B351 Tinea unguium: Secondary | ICD-10-CM | POA: Diagnosis not present

## 2020-08-30 DIAGNOSIS — M79674 Pain in right toe(s): Secondary | ICD-10-CM | POA: Diagnosis not present

## 2020-08-30 NOTE — Progress Notes (Signed)
Subjective: Brandi Shaffer is a pleasant 75 y.o. female patient seen today painful thick toenails that are difficult to trim. Pain interferes with ambulation. Aggravating factors include wearing enclosed shoe gear. Pain is relieved with periodic professional debridement.  She is daibetic and states her blood glucose was 97 mg/dl on yesterday.   PCP is Biagio Borg, MD. Last visit was: 07/12/2020.  Allergies  Allergen Reactions   Januvia [Sitagliptin Phosphate]     dizziness   Metformin And Related Nausea Only   Doxycycline Rash   Objective: Physical Exam  General: Brandi Shaffer is a pleasant 75 y.o. African American female, morbidly obese in NAD. AAO x 3.   Vascular:  Capillary refill time to digits immediate b/l. Palpable pedal pulses b/l LE. Pedal hair present. Lower extremity skin temperature gradient within normal limits. No pain with calf compression b/l.  Dermatological:  Pedal skin with normal turgor, texture and tone b/l lower extremities Toenails 1-5 right, L 2nd toe, L 3rd toe, and L 4th toe elongated, discolored, dystrophic, thickened, and crumbly with subungual debris and tenderness to dorsal palpation. Anonychia noted L hallux and L 5th toe. Nailbed(s) epithelialized.  No hyperkeratotic nor porokeratotic lesions present on today's visit.  Musculoskeletal:  Normal muscle strength 5/5 to all lower extremity muscle groups bilaterally. OA noted with dorsal prominent eminence left midfoot. Limited joint ROM to the left ankle.  Neurological:  Protective sensation intact 5/5 intact bilaterally with 10g monofilament b/l. Vibratory sensation intact b/l. Proprioception intact bilaterally.  Assessment and Plan:  1. Pain due to onychomycosis of toenails of both feet   2. Controlled type 2 diabetes mellitus without complication, without long-term current use of insulin ([place])      -Continue diabetic foot care principles. -Patient to continue soft, supportive shoe gear  daily. -Toenails 1-5 right, L 2nd toe, L 3rd toe, and L 4th toe debrided in length and girth without iatrogenic bleeding with sterile nail nipper and dremel.  -Patient to report any pedal injuries to medical professional immediately. -Patient/POA to call should there be question/concern in the interim.  Return in about 3 months (around 11/30/2020).  Marzetta Board, DPM

## 2020-09-15 DIAGNOSIS — R011 Cardiac murmur, unspecified: Secondary | ICD-10-CM | POA: Diagnosis not present

## 2020-09-15 DIAGNOSIS — Z008 Encounter for other general examination: Secondary | ICD-10-CM | POA: Diagnosis not present

## 2020-09-15 DIAGNOSIS — Z0000 Encounter for general adult medical examination without abnormal findings: Secondary | ICD-10-CM | POA: Diagnosis not present

## 2020-09-15 DIAGNOSIS — I10 Essential (primary) hypertension: Secondary | ICD-10-CM | POA: Diagnosis not present

## 2020-09-15 DIAGNOSIS — E785 Hyperlipidemia, unspecified: Secondary | ICD-10-CM | POA: Diagnosis not present

## 2020-09-15 DIAGNOSIS — E119 Type 2 diabetes mellitus without complications: Secondary | ICD-10-CM | POA: Diagnosis not present

## 2020-09-28 ENCOUNTER — Encounter: Payer: Self-pay | Admitting: Family Medicine

## 2020-09-28 ENCOUNTER — Ambulatory Visit (INDEPENDENT_AMBULATORY_CARE_PROVIDER_SITE_OTHER): Payer: Medicare Other | Admitting: Family Medicine

## 2020-09-28 ENCOUNTER — Other Ambulatory Visit: Payer: Self-pay

## 2020-09-28 DIAGNOSIS — M19172 Post-traumatic osteoarthritis, left ankle and foot: Secondary | ICD-10-CM

## 2020-09-28 DIAGNOSIS — M2170 Unequal limb length (acquired), unspecified site: Secondary | ICD-10-CM

## 2020-09-28 NOTE — Progress Notes (Signed)
[facility] [place] Phone: [phone] Subjective:   Brandi Shaffer, am serving as a scribe for Dr. Hulan Saas.  I'm seeing this patient by the request  of:  Biagio Borg, MD  CC: Right hip pain  [id number]  08/09/2020 Patient given injection today and tolerated the procedure well, discussed icing regimen and home exercises.  Work with Product/process development scientist.  We will get x-rays to further evaluate but do not feel the bony abnormalities would be likely contributing.  We will get a lumbar x-ray as well to ensure no lumbar radiculopathy.  Patient has been doing very well and we will have her follow-up again within 2 months  Update 09/28/2020 Brandi Shaffer is a 75 y.o. female coming in with complaint of R hip pain. Patient states that she is doing better but some days will get some pain and think it may be do to the shoes she is wearing and wants to know if there is a suggestion on some shoes that could help her.     Past Medical History:  Diagnosis Date   ANEMIA-IRON DEFICIENCY 12/06/2009   Anxiety state 07/01/2015   Asthma 12/05/2011   DIABETES MELLITUS, TYPE II 09/17/2006   DIVERTICULOSIS, COLON 12/06/2009   GENITAL HERPES 03/24/2010   GERD (gastroesophageal reflux disease) 06/28/2011   HYPERLIPIDEMIA 03/24/2010   HYPERTENSION 09/17/2006   HYPOTHYROIDISM 09/17/2006   LIPOMA 12/06/2009   PERIPHERAL EDEMA 03/24/2010   Renal cyst 07/05/2011   1.9 cm minimally complex   Past Surgical History:  Procedure Laterality Date   ABDOMINAL HYSTERECTOMY     fibroids   ankle surgury     x 3 left - chronic pain/swelling   COLONOSCOPY  2011   SHOULDER SURGERY Left    TONSILLECTOMY     TUBAL LIGATION     Social History   Socioeconomic History   Marital status: Divorced    Spouse name: Not on file   Number of children: 7   Years of education: Not on file   Highest education level: Not on file  Occupational History   Occupation:  CASHIER/ retired    Comment: gas station, stands for work  Tobacco Use   Smoking status: Never   Smokeless tobacco: Never  Vaping Use   Vaping Use: Never used  Substance and Sexual Activity   Alcohol use: No    Alcohol/week: 0.0 standard drinks   Drug use: No   Sexual activity: Not Currently  Other Topics Concern   Not on file  Social History Narrative   Lives with grandson and and pets;    Have 6 sons and one dtr   Have grand- children; running children through out the day and cooking   Social Determinants of Radio broadcast assistant Strain: Low Risk    Difficulty of Paying Living Expenses: Not hard at all  Food Insecurity: No Food Insecurity   Worried About Charity fundraiser in the Last Year: Never true   Arboriculturist in the Last Year: Never true  Transportation Needs: No Transportation Needs   Lack of Transportation (Medical): No   Lack of Transportation (Non-Medical): No  Physical Activity: Sufficiently Active   Days of Exercise per Week: 5 days   Minutes of Exercise per Session: 30 min  Stress: No Stress Concern Present   Feeling of Stress : Not at all  Social Connections: Moderately Integrated   Frequency of Communication with Friends  and Family: More than three times a week   Frequency of Social Gatherings with Friends and Family: More than three times a week   Attends Religious Services: More than 4 times per year   Active Member of Clubs or Organizations: No   Attends Music therapist: More than 4 times per year   Marital Status: Widowed   Allergies  Allergen Reactions   Januvia [Sitagliptin Phosphate]     dizziness   Metformin And Related Nausea Only   Doxycycline Rash   Family History  Problem Relation Age of Onset   Hypertension Mother    Hypertension Father    Heart disease Sister        sudden death early 11's   Stroke Sister    Breast cancer Sister 79   Prostate cancer Brother    Diabetes Other    Joint hypermobility Other         DJD   Colon cancer Neg Hx    Colon polyps Neg Hx    Esophageal cancer Neg Hx    Stomach cancer Neg Hx    Rectal cancer Neg Hx     Current Outpatient Medications (Endocrine & Metabolic):    pioglitazone (ACTOS) 15 MG tablet, TAKE 1 TABLET BY MOUTH EVERY DAY  Current Outpatient Medications (Cardiovascular):    amLODipine (NORVASC) 10 MG tablet, Take 1 tablet (10 mg total) by mouth daily.   atorvastatin (LIPITOR) 80 MG tablet, Take 1 tablet (80 mg total) by mouth daily.   furosemide (LASIX) 20 MG tablet, TAKE 1 TABLET BY MOUTH EVERY DAY   losartan (COZAAR) 25 MG tablet, Take 1 tablet (25 mg total) by mouth daily.  Current Outpatient Medications (Respiratory):    albuterol (VENTOLIN HFA) 108 (90 Base) MCG/ACT inhaler, INHALE 2 PUFFS BY MOUTH EVERY 6 HOURS AS NEEDED FOR WHEEZING  Current Outpatient Medications (Analgesics):    aspirin 81 MG EC tablet, TAKE 1 TABLET BY MOUTH DAILY. SWALLOW WHOLE.   Current Outpatient Medications (Other):    Cholecalciferol (VITAMIN D) 400 UNITS tablet, Take 400 Units by mouth daily.   clotrimazole-betamethasone (LOTRISONE) cream, Apply to both feet and between toes bid x 4 weeks.   Misc Natural Products (OSTEO BI-FLEX TRIPLE STRENGTH PO), Take by mouth.   Multiple Vitamins-Minerals (WOMENS MULTIVITAMIN PO), Take 1 tablet by mouth daily.   ONETOUCH DELICA LANCETS 99991111 MISC, 1 each by Does not apply route 2 (two) times daily. Use to help check blood sugars twice a day Dx E11.9   ONETOUCH VERIO test strip, USE 2 (TWO) TIMES DAILY. DX E11.9   potassium chloride (KLOR-CON) 10 MEQ tablet, TAKE 1 TABLET BY MOUTH EVERY DAY   Reviewed prior external information including notes and imaging from  primary care provider As well as notes that were available from care everywhere and other healthcare systems.  Past medical history, social, surgical and family history all reviewed in electronic medical record.  No pertanent information unless stated regarding  to the chief complaint.   Review of Systems:  No headache, visual changes, nausea, vomiting, diarrhea, constipation, dizziness, abdominal pain, skin rash, fevers, chills, night sweats, weight loss, swollen lymph nodes, body aches, joint swelling, chest pain, shortness of breath, mood changes. POSITIVE muscle aches  Objective  Blood pressure 130/86, pulse 95, height '4\' 11"'$  (1.499 m), weight 184 lb (83.5 kg), SpO2 97 %.   General: No apparent distress alert and oriented x3 mood and affect normal, dressed appropriately.  HEENT: Pupils equal, extraocular movements intact  Respiratory: Patient's speak in full sentences and does not appear short of breath  Cardiovascular: No lower extremity edema, non tender, no erythema  Gait normal with good balance and coordination.  MSK: Right hip exam shows the patient does have some improvement in range of motion.  Very mild discomfort over the greater trochanteric area.  Negative straight leg test but some mild tightness noted in the paraspinal musculature of the lumbar spine. Foot exam does show the patient does have some breakdown of the transverse arch bilaterally. .  Patient does have what appears to be a leg length discrepancy with some mild shortness of the right side by approximately quarter of an inch   Impression and Recommendations:     The above documentation has been reviewed and is accurate and complete Lyndal Pulley, DO

## 2020-09-28 NOTE — Patient Instructions (Addendum)
Good to see you  Heel lift 1/8 inch Gravity [employer] Sandals in house See me again in 2 months

## 2020-09-28 NOTE — Assessment & Plan Note (Signed)
Mild leg length discrepancy noted.  Seems to be right shoulder.  Discussed heel lift, changes.  Patient is feeling great and pain is not stopping her from any activity.  Patient does not feel any injection is necessary.  Follow-up again in 2 to 3 months

## 2020-09-28 NOTE — Assessment & Plan Note (Signed)
Patient does have arthritic changes of the left ankle.  I do believe the patient has more of the leg length discrepancy that likely more physiologic with patient having a shorter one on the right side.  We discussed heel lift, better home exercises and icing regimen as well.  I do feel that a heel lift, proper shoes will be beneficial.  Follow-up again in 2 months.

## 2020-09-30 ENCOUNTER — Other Ambulatory Visit: Payer: Self-pay | Admitting: Internal Medicine

## 2020-09-30 NOTE — Telephone Encounter (Signed)
Please refill as per office routine med refill policy (all routine meds refilled for 3 mo or monthly per pt preference up to one year from last visit, then month to month grace period for 3 mo, then further med refills will have to be denied)  

## 2020-10-09 ENCOUNTER — Other Ambulatory Visit: Payer: Self-pay | Admitting: Internal Medicine

## 2020-10-10 NOTE — Telephone Encounter (Signed)
Please refill as per office routine med refill policy (all routine meds refilled for 3 mo or monthly per pt preference up to one year from last visit, then month to month grace period for 3 mo, then further med refills will have to be denied)  

## 2020-11-24 NOTE — Progress Notes (Signed)
[facility] [address] [place] Phone: [phone] Subjective:   Brandi Shaffer, am serving as a scribe for Dr. Hulan Saas. This visit occurred during the SARS-CoV-2 public health emergency.  Safety protocols were in place, including screening questions prior to the visit, additional usage of staff PPE, and extensive cleaning of exam room while observing appropriate contact time as indicated for disinfecting solutions.   I'm seeing this patient by the request  of:  Biagio Borg, MD  CC: Hip and ankle pain follow-up  [id number]  09/28/2020 Mild leg length discrepancy noted.  Seems to be right shoulder.  Discussed heel lift, changes.  Patient is feeling great and pain is not stopping her from any activity.  Patient does not feel any injection is necessary.  Follow-up again in 2 to 3 months  Patient does have arthritic changes of the left ankle.  I do believe the patient has more of the leg length discrepancy that likely more physiologic with patient having a shorter one on the right side.  We discussed heel lift, better home exercises and icing regimen as well.  I do feel that a heel lift, proper shoes will be beneficial.  Follow-up again in 2 months.  Update 11/29/2020 Brandi Shaffer is a 75 y.o. female coming in with complaint of R hip and L ankle pain. Injected in June 2022.  This was for more of a greater trochanteric bursitis. States that when she wears flat shoes or is barefoot she gets a shooting pain down the anterior side of her right thigh.  Patient did have x-rays taken of the lumbar spine showing the patient does have facet arthropathy noted.  These were independently visualized by me again today. Patient is also had x-rays of the ankle done in January 2020 showing the patient did have posttraumatic arthritic changes noted.      Past Medical History:  Diagnosis Date   ANEMIA-IRON DEFICIENCY 12/06/2009   Anxiety state  07/01/2015   Asthma 12/05/2011   DIABETES MELLITUS, TYPE II 09/17/2006   DIVERTICULOSIS, COLON 12/06/2009   GENITAL HERPES 03/24/2010   GERD (gastroesophageal reflux disease) 06/28/2011   HYPERLIPIDEMIA 03/24/2010   HYPERTENSION 09/17/2006   HYPOTHYROIDISM 09/17/2006   LIPOMA 12/06/2009   PERIPHERAL EDEMA 03/24/2010   Renal cyst 07/05/2011   1.9 cm minimally complex   Past Surgical History:  Procedure Laterality Date   ABDOMINAL HYSTERECTOMY     fibroids   ankle surgury     x 3 left - chronic pain/swelling   COLONOSCOPY  2011   SHOULDER SURGERY Left    TONSILLECTOMY     TUBAL LIGATION     Social History   Socioeconomic History   Marital status: Divorced    Spouse name: Not on file   Number of children: 7   Years of education: Not on file   Highest education level: Not on file  Occupational History   Occupation: CASHIER/ retired    Comment: gas station, stands for work  Tobacco Use   Smoking status: Never   Smokeless tobacco: Never  Vaping Use   Vaping Use: Never used  Substance and Sexual Activity   Alcohol use: No    Alcohol/week: 0.0 standard drinks   Drug use: No   Sexual activity: Not Currently  Other Topics Concern   Not on file  Social History Narrative   Lives with grandson and and pets;    Have 6 sons and one dtr  Have grand- children; running children through out the day and cooking   Social Determinants of Health   Financial Resource Strain: Low Risk    Difficulty of Paying Living Expenses: Not hard at all  Food Insecurity: No Food Insecurity   Worried About Charity fundraiser in the Last Year: Never true   Arboriculturist in the Last Year: Never true  Transportation Needs: No Transportation Needs   Lack of Transportation (Medical): No   Lack of Transportation (Non-Medical): No  Physical Activity: Sufficiently Active   Days of Exercise per Week: 5 days   Minutes of Exercise per Session: 30 min  Stress: No Stress Concern Present   Feeling of Stress :  Not at all  Social Connections: Moderately Integrated   Frequency of Communication with Friends and Family: More than three times a week   Frequency of Social Gatherings with Friends and Family: More than three times a week   Attends Religious Services: More than 4 times per year   Active Member of Clubs or Organizations: No   Attends Music therapist: More than 4 times per year   Marital Status: Widowed   Allergies  Allergen Reactions   Januvia [Sitagliptin Phosphate]     dizziness   Metformin And Related Nausea Only   Doxycycline Rash   Family History  Problem Relation Age of Onset   Hypertension Mother    Hypertension Father    Heart disease Sister        sudden death early 65's   Stroke Sister    Breast cancer Sister 68   Prostate cancer Brother    Diabetes Other    Joint hypermobility Other        DJD   Colon cancer Neg Hx    Colon polyps Neg Hx    Esophageal cancer Neg Hx    Stomach cancer Neg Hx    Rectal cancer Neg Hx     Current Outpatient Medications (Endocrine & Metabolic):    pioglitazone (ACTOS) 15 MG tablet, TAKE 1 TABLET BY MOUTH EVERY DAY  Current Outpatient Medications (Cardiovascular):    amLODipine (NORVASC) 10 MG tablet, Take 1 tablet (10 mg total) by mouth daily.   atorvastatin (LIPITOR) 80 MG tablet, Take 1 tablet (80 mg total) by mouth daily.   furosemide (LASIX) 20 MG tablet, TAKE 1 TABLET BY MOUTH EVERY DAY   losartan (COZAAR) 25 MG tablet, Take 1 tablet (25 mg total) by mouth daily.  Current Outpatient Medications (Respiratory):    albuterol (VENTOLIN HFA) 108 (90 Base) MCG/ACT inhaler, INHALE 2 PUFFS BY MOUTH EVERY 6 HOURS AS NEEDED FOR WHEEZING  Current Outpatient Medications (Analgesics):    aspirin 81 MG EC tablet, TAKE 1 TABLET BY MOUTH DAILY. SWALLOW WHOLE.   Current Outpatient Medications (Other):    Cholecalciferol (VITAMIN D) 400 UNITS tablet, Take 400 Units by mouth daily.   clotrimazole-betamethasone (LOTRISONE)  cream, Apply to both feet and between toes bid x 4 weeks.   Misc Natural Products (OSTEO BI-FLEX TRIPLE STRENGTH PO), Take by mouth.   Multiple Vitamins-Minerals (WOMENS MULTIVITAMIN PO), Take 1 tablet by mouth daily.   ONETOUCH DELICA LANCETS 41D MISC, 1 each by Does not apply route 2 (two) times daily. Use to help check blood sugars twice a day Dx E11.9   ONETOUCH VERIO test strip, USE 2 (TWO) TIMES DAILY. DX E11.9   potassium chloride (KLOR-CON) 10 MEQ tablet, TAKE 1 TABLET BY MOUTH EVERY DAY   Reviewed prior  external information including notes and imaging from  primary care provider As well as notes that were available from care everywhere and other healthcare systems.  Past medical history, social, surgical and family history all reviewed in electronic medical record.  No pertanent information unless stated regarding to the chief complaint.   Review of Systems:  No headache, visual changes, nausea, vomiting, diarrhea, constipation, dizziness, abdominal pain, skin rash, fevers, chills, night sweats, weight loss, swollen lymph nodes,  joint swelling, chest pain, shortness of breath, mood changes. POSITIVE muscle aches, Body aches  Objective  Blood pressure (!) 160/92, pulse 85, height 4\' 11"  (1.499 m), weight 183 lb (83 kg), SpO2 95 %.   General: No apparent distress alert and oriented x3 mood and affect normal, dressed appropriately.  HEENT: Pupils equal, extraocular movements intact  Respiratory: Patient's speak in full sentences and does not appear short of breath  Cardiovascular: No lower extremity edema, non tender, no erythema  Gait very minorly antalgic Patient does have some tightness noted on the right side of the paraspinal musculature.  Mild tenderness to palpation noted.  Tightness with FABER test noted.  Knee exam has some instability but very minimal tenderness noted today.    Impression and Recommendations:    The above documentation has been reviewed and is accurate  and complete Lyndal Pulley, DO

## 2020-11-29 ENCOUNTER — Ambulatory Visit (INDEPENDENT_AMBULATORY_CARE_PROVIDER_SITE_OTHER): Payer: Medicare Other | Admitting: Family Medicine

## 2020-11-29 ENCOUNTER — Other Ambulatory Visit: Payer: Self-pay

## 2020-11-29 ENCOUNTER — Ambulatory Visit: Payer: Self-pay

## 2020-11-29 VITALS — BP 160/92 | HR 85 | Ht 59.0 in | Wt 183.0 lb

## 2020-11-29 DIAGNOSIS — M19172 Post-traumatic osteoarthritis, left ankle and foot: Secondary | ICD-10-CM | POA: Diagnosis not present

## 2020-11-29 DIAGNOSIS — M7061 Trochanteric bursitis, right hip: Secondary | ICD-10-CM | POA: Diagnosis not present

## 2020-11-29 NOTE — Patient Instructions (Signed)
Great to see you Keep up the weight loss Do prescribed exercises at least 3x a week Oofos of [place] sandals in the house See you again in 2 months

## 2020-11-30 ENCOUNTER — Encounter: Payer: Self-pay | Admitting: Podiatry

## 2020-11-30 ENCOUNTER — Ambulatory Visit (INDEPENDENT_AMBULATORY_CARE_PROVIDER_SITE_OTHER): Payer: Medicare Other | Admitting: Podiatry

## 2020-11-30 DIAGNOSIS — B353 Tinea pedis: Secondary | ICD-10-CM

## 2020-11-30 DIAGNOSIS — M79675 Pain in left toe(s): Secondary | ICD-10-CM

## 2020-11-30 DIAGNOSIS — B351 Tinea unguium: Secondary | ICD-10-CM | POA: Diagnosis not present

## 2020-11-30 DIAGNOSIS — E119 Type 2 diabetes mellitus without complications: Secondary | ICD-10-CM

## 2020-11-30 DIAGNOSIS — M79674 Pain in right toe(s): Secondary | ICD-10-CM

## 2020-11-30 DIAGNOSIS — L853 Xerosis cutis: Secondary | ICD-10-CM | POA: Diagnosis not present

## 2020-11-30 MED ORDER — AMMONIUM LACTATE 12 % EX LOTN: 1.0000 "application " | TOPICAL_LOTION | CUTANEOUS | 5 refills | Status: DC | PRN

## 2020-11-30 MED ORDER — CLOTRIMAZOLE 1 % EX CREA: TOPICAL_CREAM | CUTANEOUS | 1 refills | Status: AC

## 2020-11-30 NOTE — Patient Instructions (Signed)
Athlete's Foot Athlete's foot (tinea pedis) is a fungal infection of the skin on your feet. It often occurs on the skin that is between or underneath the toes. It can also occur on the soles of your feet. The infection can spread from person to person (is contagious). It can also spread when a person's bare feet come in contact with the fungus on shower floors or on items such as shoes. What are the causes? This condition is caused by a fungus that grows in warm, moist places. You can get athlete's foot by sharing shoes, shower stalls, towels, and wet floors with someone who is infected. Not washing your feet or changing your socks often enough can also lead to athlete's foot. What increases the risk? This condition is more likely to develop in: Men. People who have a weak body defense system (immune system). People who have diabetes. People who use public showers, such as at a gym. People who wear heavy-duty shoes, such as Environmental manager. Seasons with warm, humid weather. What are the signs or symptoms? Symptoms of this condition include: Itchy areas between your toes or on the soles of your feet. White, flaky, or scaly areas between your toes or on the soles of your feet. Very itchy small blisters between your toes or on the soles of your feet. Small cuts in your skin. These cuts can become infected. Thick or discolored toenails. How is this diagnosed? This condition may be diagnosed with a physical exam and a review of your medical history. Your health care provider may also take a skin or toenail sample to examine under a microscope. How is this treated? This condition is treated with antifungal medicines. These may be applied as powders, ointments, or creams. In severe cases, an oral antifungal medicine may be given. Follow these instructions at home: Medicines Apply or take over-the-counter and prescription medicines only as told by your health care provider. Apply your  antifungal medicine as told by your health care provider. Do not stop using the antifungal even if your condition improves. Foot care Do not scratch your feet. Keep your feet dry: Wear cotton or wool socks. Change your socks every day or if they become wet. Wear shoes that allow air to flow, such as sandals or canvas tennis shoes. Wash and dry your feet, including the area between your toes. Also, wash and dry your feet: Every day or as told by your health care provider. After exercising. General instructions Do not let others use towels, shoes, nail clippers, or other personal items that touch your feet. Protect your feet by wearing sandals in wet areas, such as locker rooms and shared showers. Keep all follow-up visits as told by your health care provider. This is important. If you have diabetes, keep your blood sugar under control. Contact a health care provider if: You have a fever. You have swelling, soreness, warmth, or redness in your foot. Your feet are not getting better with treatment. Your symptoms get worse. You have new symptoms. Summary Athlete's foot (tinea pedis) is a fungal infection of the skin on your feet. It often occurs on skin that is between or underneath the toes. This condition is caused by a fungus that grows in warm, moist places. Symptoms include white, flaky, or scaly areas between your toes or on the soles of your feet. This condition is treated with antifungal medicines. Keep your feet clean. Always dry them thoroughly. This information is not intended to replace advice given  to you by your health care provider. Make sure you discuss any questions you have with your health care provider. Document Revised: 10/01/2019 Document Reviewed: 10/01/2019 Elsevier Patient Education  2022 [employer].

## 2020-12-02 NOTE — Assessment & Plan Note (Signed)
Patient does have arthritic changes of the ankle noted.  Discussed with patient about the rocker-bottom shoes, exercises, and continuing with the weight loss which I think is significantly beneficial.  Patient will follow up with me again in 3 months if not better.

## 2020-12-02 NOTE — Assessment & Plan Note (Signed)
Patient is better after the injection.  Discussed home exercises and icing regimen.  We discussed that we can continue to monitor.  Would be more secondary to more of a lumbar radiculopathy if this continues.  Encourage patient to continue to work on the weight loss.  Follow-up again in 3 months

## 2020-12-04 NOTE — Progress Notes (Signed)
Subjective:  Patient ID: Brandi Shaffer, female    DOB: 1945/10/05,  MRN: [id number]  Brandi Shaffer presents to clinic today for preventative diabetic foot care and painful thick toenails that are difficult to trim. Pain interferes with ambulation. Aggravating factors include wearing enclosed shoe gear. Pain is relieved with periodic professional debridement.  Patient did not check blood glucose today.  She states she has been applying Vitamin A&D Ointment to her feet daily, but skin still remains dry at times. and her left foot has remained dry and scaly.  PCP is Corwin Levins, MD , and last visit was 07/12/2020.  Allergies  Allergen Reactions   Januvia [Sitagliptin Phosphate]     dizziness   Metformin And Related Nausea Only   Doxycycline Rash    Review of Systems: Negative except as noted in the HPI. Objective:   Constitutional Brandi Shaffer is a pleasant 75 y.o. African American female, in NAD. AAO x 3.   Vascular Capillary refill time to digits immediate b/l. Palpable DP pulse(s) b/l lower extremities Palpable PT pulse(s) b/l lower extremities Pedal hair present. Lower extremity skin temperature gradient within normal limits. No pain with calf compression b/l. No edema noted b/l lower extremities. No cyanosis or clubbing noted.  Neurologic Normal speech. Oriented to person, place, and time. Protective sensation intact 5/5 intact bilaterally with 10g monofilament b/l. Vibratory sensation intact b/l.  Dermatologic Pedal skin with normal turgor, texture and tone b/l lower extremities. Toenails 1-5 right, left 2-4 elongated, discolored, dystrophic, thickened, and crumbly with subungual debris and tenderness to dorsal palpation. Anonychia noted L hallux and L 5th toe. Nailbed(s) epithelialized.  Diffuse scaling noted peripherally and plantarly left foot with mild foot odor.  No interdigital macerations.  No blisters, no weeping. No signs of secondary bacterial infection noted.   Orthopedic: Normal muscle strength 5/5 to all lower extremity muscle groups bilaterally. Bony prominence dorsal aspect of midfoot LLE. Limited joint ROM to the left ankle.   Radiographs: None Assessment:   1. Pain due to onychomycosis of toenails of both feet   2. Tinea pedis of left foot   3. Controlled type 2 diabetes mellitus without complication, without long-term current use of insulin (HCC)    Plan:  Patient was evaluated and treated and all questions answered. Consent given for treatment as described below: -Examined patient. -Patient to continue soft, supportive shoe gear daily. -Toenails 1-5 right, L 2nd toe, L 3rd toe, and L 4th toe debrided in length and girth without iatrogenic bleeding with sterile nail nipper and dremel.  -Patient to report any pedal injuries to medical professional immediately. --For tinea pedis, Rx sent to pharmacy for Clotrimazole Cream 1% to be applied tob/l feet twice daily for 6 weeks. -Rx sent to pharmacy for AmLactin Lotion 12% to be applied to feet daily for xerosis -Patient/POA to call should there be question/concern in the interim.  Return in about 3 months (around 03/02/2021).  [name], DPM

## 2020-12-20 ENCOUNTER — Other Ambulatory Visit: Payer: Self-pay | Admitting: Internal Medicine

## 2020-12-20 DIAGNOSIS — Z1231 Encounter for screening mammogram for malignant neoplasm of breast: Secondary | ICD-10-CM

## 2021-01-11 ENCOUNTER — Encounter: Payer: Self-pay | Admitting: Internal Medicine

## 2021-01-11 ENCOUNTER — Ambulatory Visit (INDEPENDENT_AMBULATORY_CARE_PROVIDER_SITE_OTHER): Payer: Medicare Other | Admitting: Internal Medicine

## 2021-01-11 ENCOUNTER — Other Ambulatory Visit: Payer: Self-pay

## 2021-01-11 VITALS — BP 146/88 | HR 93 | Temp 98.4°F | Ht 59.0 in | Wt 182.0 lb

## 2021-01-11 DIAGNOSIS — I10 Essential (primary) hypertension: Secondary | ICD-10-CM

## 2021-01-11 DIAGNOSIS — E7800 Pure hypercholesterolemia, unspecified: Secondary | ICD-10-CM | POA: Diagnosis not present

## 2021-01-11 DIAGNOSIS — E559 Vitamin D deficiency, unspecified: Secondary | ICD-10-CM | POA: Diagnosis not present

## 2021-01-11 DIAGNOSIS — E1165 Type 2 diabetes mellitus with hyperglycemia: Secondary | ICD-10-CM

## 2021-01-11 DIAGNOSIS — Z23 Encounter for immunization: Secondary | ICD-10-CM

## 2021-01-11 LAB — BASIC METABOLIC PANEL
BUN: 14 mg/dL (ref 6–23)
CO2: 29 mEq/L (ref 19–32)
Calcium: 9.2 mg/dL (ref 8.4–10.5)
Chloride: 108 mEq/L (ref 96–112)
Creatinine, Ser: 0.85 mg/dL (ref 0.40–1.20)
GFR: 66.71 mL/min (ref >60.00)
Glucose, Bld: 84 mg/dL (ref 70–99)
Potassium: 4.2 mEq/L (ref 3.5–5.1)
Sodium: 144 mEq/L (ref 135–145)

## 2021-01-11 LAB — HEMOGLOBIN A1C: Hgb A1c MFr Bld: 5.8 % (ref 4.6–6.5)

## 2021-01-11 LAB — HEPATIC FUNCTION PANEL
ALT: 12 U/L (ref 0–35)
AST: 19 U/L (ref 0–37)
Albumin: 4.1 g/dL (ref 3.5–5.2)
Alkaline Phosphatase: 104 U/L (ref 39–117)
Bilirubin, Direct: 0.1 mg/dL (ref 0.0–0.3)
Total Bilirubin: 0.5 mg/dL (ref 0.2–1.2)
Total Protein: 6.8 g/dL (ref 6.0–8.3)

## 2021-01-11 LAB — LIPID PANEL
Cholesterol: 201 mg/dL — ABNORMAL HIGH (ref 0–200)
HDL: 56.3 mg/dL (ref >39.00)
LDL Cholesterol: 127 mg/dL — ABNORMAL HIGH (ref 0–99)
NonHDL: 144.31
Total CHOL/HDL Ratio: 4
Triglycerides: 89 mg/dL (ref 0.0–149.0)
VLDL: 17.8 mg/dL (ref 0.0–40.0)

## 2021-01-11 LAB — VITAMIN D 25 HYDROXY (VIT D DEFICIENCY, FRACTURES): VITD: 30.11 ng/mL (ref 30.00–100.00)

## 2021-01-11 MED ORDER — VITAMIN B-12 1000 MCG PO TABS: ORAL_TABLET | ORAL | 3 refills | Status: AC

## 2021-01-11 MED ORDER — CHOLECALCIFEROL 50 MCG (2000 UT) PO TABS: ORAL_TABLET | ORAL | 99 refills | Status: AC

## 2021-01-11 NOTE — Progress Notes (Signed)
Patient ID: Brandi Shaffer, female   DOB: 06-27-1945, 75 y.o.   MRN: [id number]        Chief Complaint: follow up HTN, HLD and hyperglycemia, low vit d       HPI:  Brandi Shaffer is a 75 y.o. female here overall doing well but admits to not taking her statin or vit d well.  BP at home < 140/90.  CBGs 92 in the AM usually, but no lower sugars later in the day.  Pt denies chest pain, increased sob or doe, wheezing, orthopnea, PND, increased LE swelling, palpitations, dizziness or syncope.   Pt denies polydipsia, polyuria, or new focal neuro s/s.   Pt denies fever, wt loss, night sweats, loss of appetite, or other constitutional symptoms   No other new complaints   due for flu shot Wt Readings from Last 3 Encounters:  01/11/21 182 lb (82.6 kg)  11/29/20 183 lb (83 kg)  09/28/20 184 lb (83.5 kg)   BP Readings from Last 3 Encounters:  01/11/21 (!) 146/88  11/29/20 (!) 160/92  09/28/20 130/86         Past Medical History:  Diagnosis Date   ANEMIA-IRON DEFICIENCY 12/06/2009   Anxiety state 07/01/2015   Asthma 12/05/2011   DIABETES MELLITUS, TYPE II 09/17/2006   DIVERTICULOSIS, COLON 12/06/2009   GENITAL HERPES 03/24/2010   GERD (gastroesophageal reflux disease) 06/28/2011   HYPERLIPIDEMIA 03/24/2010   HYPERTENSION 09/17/2006   HYPOTHYROIDISM 09/17/2006   LIPOMA 12/06/2009   PERIPHERAL EDEMA 03/24/2010   Renal cyst 07/05/2011   1.9 cm minimally complex   Past Surgical History:  Procedure Laterality Date   ABDOMINAL HYSTERECTOMY     fibroids   ankle surgury     x 3 left - chronic pain/swelling   COLONOSCOPY  2011   SHOULDER SURGERY Left    TONSILLECTOMY     TUBAL LIGATION      reports that she has never smoked. She has never used smokeless tobacco. She reports that she does not drink alcohol and does not use drugs. family history includes Breast cancer (age of onset: 35) in her sister; Diabetes in an other family member; Heart disease in her sister; Hypertension in her father and mother;  Joint hypermobility in an other family member; Prostate cancer in her brother; Stroke in her sister. Allergies  Allergen Reactions   Januvia [Sitagliptin Phosphate]     dizziness   Metformin And Related Nausea Only   Doxycycline Rash   Current Outpatient Medications on File Prior to Visit  Medication Sig Dispense Refill   albuterol (VENTOLIN HFA) 108 (90 Base) MCG/ACT inhaler INHALE 2 PUFFS BY MOUTH EVERY 6 HOURS AS NEEDED FOR WHEEZING 8.5 each 5   amLODipine (NORVASC) 10 MG tablet Take 1 tablet (10 mg total) by mouth daily. 90 tablet 3   ammonium lactate (LAC-HYDRIN) 12 % lotion Apply 1 application topically as needed for dry skin. 400 g 5   aspirin 81 MG EC tablet TAKE 1 TABLET BY MOUTH DAILY. SWALLOW WHOLE. 30 tablet 11   atorvastatin (LIPITOR) 80 MG tablet Take 1 tablet (80 mg total) by mouth daily. 90 tablet 3   clotrimazole (LOTRIMIN) 1 % cream Apply to affected feet and between toes twice daily for 6 weeks for athlete's feet 60 g 1   clotrimazole-betamethasone (LOTRISONE) cream Apply to both feet and between toes bid x 4 weeks. 45 g 1   losartan (COZAAR) 25 MG tablet Take 1 tablet (25 mg total) by mouth daily.  90 tablet 3   Multiple Vitamins-Minerals (WOMENS MULTIVITAMIN PO) Take 1 tablet by mouth daily.     ONETOUCH DELICA LANCETS 33G MISC 1 each by Does not apply route 2 (two) times daily. Use to help check blood sugars twice a day Dx E11.9 200 each 11   ONETOUCH VERIO test strip USE 2 (TWO) TIMES DAILY. DX E11.9 100 strip 4   pioglitazone (ACTOS) 15 MG tablet TAKE 1 TABLET BY MOUTH EVERY DAY 90 tablet 1   potassium chloride (KLOR-CON) 10 MEQ tablet TAKE 1 TABLET BY MOUTH EVERY DAY 90 tablet 1   No current facility-administered medications on file prior to visit.        ROS:  All others reviewed and negative.  Objective        PE:  BP (!) 146/88 (BP Location: Left Arm, Patient Position: Sitting, Cuff Size: Large)   Pulse 93   Temp 98.4 F (36.9 C) (Oral)   Ht 4\' 11"  (1.499  m)   Wt 182 lb (82.6 kg)   SpO2 98%   BMI 36.76 kg/m                 Constitutional: Pt appears in NAD               HENT: Head: NCAT.                Right Ear: External ear normal.                 Left Ear: External ear normal.                Eyes: . Pupils are equal, round, and reactive to light. Conjunctivae and EOM are normal               Nose: without d/c or deformity               Neck: Neck supple. Gross normal ROM               Cardiovascular: Normal rate and regular rhythm.  Gr 2/6 sys murmur RUSB               Pulmonary/Chest: Effort normal and breath sounds without rales or wheezing.                Abd:  Soft, NT, ND, + BS, no organomegaly               Neurological: Pt is alert. At baseline orientation, motor grossly intact               Skin: Skin is warm. No rashes, no other new lesions, LE edema - none               Psychiatric: Pt behavior is normal without agitation   Micro: none  Cardiac tracings I have personally interpreted today:  none  Pertinent Radiological findings (summarize): none   Lab Results  Component Value Date   WBC 6.6 07/12/2020   HGB 14.2 07/12/2020   HCT 42.2 07/12/2020   PLT 279.0 07/12/2020   GLUCOSE 84 01/11/2021   CHOL 201 (H) 01/11/2021   TRIG 89.0 01/11/2021   HDL 56.30 01/11/2021   LDLDIRECT 134.6 05/23/2010   LDLCALC 127 (H) 01/11/2021   ALT 12 01/11/2021   AST 19 01/11/2021   NA 144 01/11/2021   K 4.2 01/11/2021   CL 108 01/11/2021   CREATININE 0.85 01/11/2021   BUN 14 01/11/2021   CO2 29  01/11/2021   TSH 2.01 07/12/2020   HGBA1C 5.8 01/11/2021   MICROALBUR <0.7 07/12/2020   Assessment/Plan:  Brandi Shaffer is a 75 y.o. Black or African American [2] female with  has a past medical history of ANEMIA-IRON DEFICIENCY (12/06/2009), Anxiety state (07/01/2015), Asthma (12/05/2011), DIABETES MELLITUS, TYPE II (09/17/2006), DIVERTICULOSIS, COLON (12/06/2009), GENITAL HERPES (03/24/2010), GERD (gastroesophageal reflux disease)  (06/28/2011), HYPERLIPIDEMIA (03/24/2010), HYPERTENSION (09/17/2006), HYPOTHYROIDISM (09/17/2006), LIPOMA (12/06/2009), PERIPHERAL EDEMA (03/24/2010), and Renal cyst (07/05/2011).  Diabetes Lab Results  Component Value Date   HGBA1C 5.8 01/11/2021   Stable, pt to continue current medical treatment actos 15   Essential hypertension BP Readings from Last 3 Encounters:  01/11/21 (!) 146/88  11/29/20 (!) 160/92  09/28/20 130/86   Uncontrolled, admits to some non compliance with med, pt to continue medical treatment norvasc losartan with better compliance   Hyperlipidemia Lab Results  Component Value Date   LDLCALC 127 (H) 01/11/2021   Uncontrolled, with some admitted noncompliance, pt to continue current statin lipitor 80 with better compliance   Vitamin D deficiency Last vitamin D Lab Results  Component Value Date   VD25OH 30.11 01/11/2021   Low, to start oral replacement  Followup: Return in about 6 months (around 07/11/2021).  Oliver Barre, MD 01/15/2021 2:52 PM [facility]

## 2021-01-11 NOTE — Patient Instructions (Signed)
You had the flu shot today  Please continue all other medications as before, including the Vit D and the cholesterol medication every day  Please have the pharmacy call with any other refills you may need.  Please continue your efforts at being more active, low cholesterol diet, and weight control.  Please keep your appointments with your specialists as you may have planned  Please go to the LAB at the blood drawing area for the tests to be done  You will be contacted by phone if any changes need to be made immediately.  Otherwise, you will receive a letter about your results with an explanation, but please check with MyChart first.  Please remember to sign up for MyChart if you have not done so, as this will be important to you in the future with finding out test results, communicating by private email, and scheduling acute appointments online when needed.  Please make an Appointment to return in 6 months, or sooner if needed

## 2021-01-15 ENCOUNTER — Encounter: Payer: Self-pay | Admitting: Internal Medicine

## 2021-01-15 NOTE — Assessment & Plan Note (Signed)
Lab Results  Component Value Date   HGBA1C 5.8 01/11/2021   Stable, pt to continue current medical treatment actos 15

## 2021-01-15 NOTE — Assessment & Plan Note (Signed)
Last vitamin D Lab Results  Component Value Date   VD25OH 30.11 01/11/2021   Low, to start oral replacement

## 2021-01-15 NOTE — Assessment & Plan Note (Signed)
Lab Results  Component Value Date   LDLCALC 127 (H) 01/11/2021   Uncontrolled, with some admitted noncompliance, pt to continue current statin lipitor 80 with better compliance

## 2021-01-15 NOTE — Assessment & Plan Note (Signed)
BP Readings from Last 3 Encounters:  01/11/21 (!) 146/88  11/29/20 (!) 160/92  09/28/20 130/86   Uncontrolled, admits to some non compliance with med, pt to continue medical treatment norvasc losartan with better compliance

## 2021-01-24 ENCOUNTER — Other Ambulatory Visit: Payer: Self-pay

## 2021-01-24 ENCOUNTER — Ambulatory Visit
Admission: RE | Admit: 2021-01-24 | Discharge: 2021-01-24 | Disposition: A | Discharge status: Home / Self Care | Payer: Medicare Other | Source: Ambulatory Visit | Attending: Internal Medicine | Admitting: Internal Medicine

## 2021-01-24 DIAGNOSIS — Z1231 Encounter for screening mammogram for malignant neoplasm of breast: Secondary | ICD-10-CM

## 2021-01-28 ENCOUNTER — Other Ambulatory Visit: Payer: Self-pay | Admitting: Internal Medicine

## 2021-01-29 NOTE — Telephone Encounter (Signed)
Please refill as per office routine med refill policy (all routine meds to be refilled for 3 mo or monthly (per pt preference) up to one year from last visit, then month to month grace period for 3 mo, then further med refills will have to be denied) ? ?

## 2021-01-30 NOTE — Progress Notes (Deleted)
Brandi Shaffer Phone: [phone] Subjective:    I'm seeing this patient by the request  of:  Biagio Borg, MD  CC:   [id number]  11/29/2020 Patient is better after the injection.  Discussed home exercises and icing regimen.  We discussed that we can continue to monitor.  Would be more secondary to more of a lumbar radiculopathy if this continues.  Encourage patient to continue to work on the weight loss.  Follow-up again in 3 months  Patient does have arthritic changes of the ankle noted.  Discussed with patient about the rocker-bottom shoes, exercises, and continuing with the weight loss which I think is significantly beneficial.  Patient will follow up with me again in 3 months if not better.  Update 01/31/2021 Brandi Shaffer is a 75 y.o. female coming in with complaint of R hip and L ankle pain. Patient states        Past Medical History:  Diagnosis Date   ANEMIA-IRON DEFICIENCY 12/06/2009   Anxiety state 07/01/2015   Asthma 12/05/2011   DIABETES MELLITUS, TYPE II 09/17/2006   DIVERTICULOSIS, COLON 12/06/2009   GENITAL HERPES 03/24/2010   GERD (gastroesophageal reflux disease) 06/28/2011   HYPERLIPIDEMIA 03/24/2010   HYPERTENSION 09/17/2006   HYPOTHYROIDISM 09/17/2006   LIPOMA 12/06/2009   PERIPHERAL EDEMA 03/24/2010   Renal cyst 07/05/2011   1.9 cm minimally complex   Past Surgical History:  Procedure Laterality Date   ABDOMINAL HYSTERECTOMY     fibroids   ankle surgury     x 3 left - chronic pain/swelling   COLONOSCOPY  2011   SHOULDER SURGERY Left    TONSILLECTOMY     TUBAL LIGATION     Social History   Socioeconomic History   Marital status: Divorced    Spouse name: Not on file   Number of children: 7   Years of education: Not on file   Highest education level: Not on file  Occupational History   Occupation: CASHIER/ retired    Comment: gas station, stands for work  Tobacco Use   Smoking  status: Never   Smokeless tobacco: Never  Vaping Use   Vaping Use: Never used  Substance and Sexual Activity   Alcohol use: No    Alcohol/week: 0.0 standard drinks   Drug use: No   Sexual activity: Not Currently  Other Topics Concern   Not on file  Social History Narrative   Lives with grandson and and pets;    Have 6 sons and one dtr   Have grand- children; running children through out the day and cooking   Social Determinants of Radio broadcast assistant Strain: Not on file  Food Insecurity: Not on file  Transportation Needs: Not on file  Physical Activity: Not on file  Stress: Not on file  Social Connections: Not on file   Allergies  Allergen Reactions   Januvia [Sitagliptin Phosphate]     dizziness   Metformin And Related Nausea Only   Doxycycline Rash   Family History  Problem Relation Age of Onset   Hypertension Mother    Hypertension Father    Heart disease Sister        sudden death early 58's   Stroke Sister    Breast cancer Sister 48   Prostate cancer Brother    Diabetes Other    Joint hypermobility Other        DJD   Colon cancer Neg Hx  Colon polyps Neg Hx    Esophageal cancer Neg Hx    Stomach cancer Neg Hx    Rectal cancer Neg Hx     Current Outpatient Medications (Endocrine & Metabolic):    pioglitazone (ACTOS) 15 MG tablet, TAKE 1 TABLET BY MOUTH EVERY DAY  Current Outpatient Medications (Cardiovascular):    amLODipine (NORVASC) 10 MG tablet, Take 1 tablet (10 mg total) by mouth daily.   atorvastatin (LIPITOR) 80 MG tablet, Take 1 tablet (80 mg total) by mouth daily.   losartan (COZAAR) 25 MG tablet, Take 1 tablet (25 mg total) by mouth daily.  Current Outpatient Medications (Respiratory):    albuterol (VENTOLIN HFA) 108 (90 Base) MCG/ACT inhaler, TAKE 2 PUFFS BY MOUTH EVERY 6 HOURS AS NEEDED FOR WHEEZE  Current Outpatient Medications (Analgesics):    aspirin 81 MG EC tablet, TAKE 1 TABLET BY MOUTH DAILY. SWALLOW WHOLE.  Current  Outpatient Medications (Hematological):    vitamin B-12 (CYANOCOBALAMIN) 1000 MCG tablet, 1 tab by mouth mon - wed - thur  Current Outpatient Medications (Other):    ammonium lactate (LAC-HYDRIN) 12 % lotion, Apply 1 application topically as needed for dry skin.   Cholecalciferol 50 MCG (2000 UT) TABS, 1 tab by mouth once daily   clotrimazole (LOTRIMIN) 1 % cream, Apply to affected feet and between toes twice daily for 6 weeks for athlete's feet   clotrimazole-betamethasone (LOTRISONE) cream, Apply to both feet and between toes bid x 4 weeks.   Multiple Vitamins-Minerals (WOMENS MULTIVITAMIN PO), Take 1 tablet by mouth daily.   ONETOUCH DELICA LANCETS 10C MISC, 1 each by Does not apply route 2 (two) times daily. Use to help check blood sugars twice a day Dx E11.9   ONETOUCH VERIO test strip, USE 2 (TWO) TIMES DAILY. DX E11.9   potassium chloride (KLOR-CON) 10 MEQ tablet, TAKE 1 TABLET BY MOUTH EVERY DAY   Reviewed prior external information including notes and imaging from  primary care provider As well as notes that were available from care everywhere and other healthcare systems.  Past medical history, social, surgical and family history all reviewed in electronic medical record.  No pertanent information unless stated regarding to the chief complaint.   Review of Systems:  No headache, visual changes, nausea, vomiting, diarrhea, constipation, dizziness, abdominal pain, skin rash, fevers, chills, night sweats, weight loss, swollen lymph nodes, body aches, joint swelling, chest pain, shortness of breath, mood changes. POSITIVE muscle aches  Objective  There were no vitals taken for this visit.   General: No apparent distress alert and oriented x3 mood and affect normal, dressed appropriately.  HEENT: Pupils equal, extraocular movements intact  Respiratory: Patient's speak in full sentences and does not appear short of breath  Cardiovascular: No lower extremity edema, non tender, no  erythema  Gait normal with good balance and coordination.  MSK:  Non tender with full range of motion and good stability and symmetric strength and tone of shoulders, elbows, wrist, hip, knee and ankles bilaterally.     Impression and Recommendations:     The above documentation has been reviewed and is accurate and complete Jacqualin Combes

## 2021-01-31 ENCOUNTER — Ambulatory Visit: Payer: Medicare Other | Admitting: Family Medicine

## 2021-02-21 ENCOUNTER — Encounter (INDEPENDENT_AMBULATORY_CARE_PROVIDER_SITE_OTHER): Payer: Medicare Other | Admitting: Ophthalmology

## 2021-02-22 ENCOUNTER — Ambulatory Visit: Payer: Medicare Other

## 2021-03-02 ENCOUNTER — Encounter (INDEPENDENT_AMBULATORY_CARE_PROVIDER_SITE_OTHER): Payer: Commercial Managed Care - HMO | Admitting: Ophthalmology

## 2021-03-03 ENCOUNTER — Ambulatory Visit (INDEPENDENT_AMBULATORY_CARE_PROVIDER_SITE_OTHER): Payer: Commercial Managed Care - HMO

## 2021-03-03 DIAGNOSIS — Z0000 Encounter for general adult medical examination without abnormal findings: Secondary | ICD-10-CM

## 2021-03-03 NOTE — Progress Notes (Signed)
I connected with Brandi Shaffer today by telephone and verified that I am speaking with the correct person using two identifiers. Location patient: home Location provider: work Persons participating in the virtual visit: patient, provider.   I discussed the limitations, risks, security and privacy concerns of performing an evaluation and management service by telephone and the availability of in person appointments. I also discussed with the patient that there may be a patient responsible charge related to this service. The patient expressed understanding and verbally consented to this telephonic visit.    Interactive audio and video telecommunications were attempted between this provider and patient, however failed, due to patient having technical difficulties OR patient did not have access to video capability.  We continued and completed visit with audio only.  Some vital signs may be absent or patient reported.   Time Spent with patient on telephone encounter: 40 minutes  Subjective:   Brandi Shaffer is a 76 y.o. female who presents for Medicare Annual (Subsequent) preventive examination.  Review of Systems     Cardiac Risk Factors include: advanced age (>88men, >85 women);dyslipidemia;family history of premature cardiovascular disease;hypertension     Objective:    There were no vitals filed for this visit. There is no height or weight on file to calculate BMI.  Advanced Directives 03/03/2021 01/12/2020 01/06/2019 11/15/2017 11/14/2016 07/31/2015 06/30/2015  Does Patient Have a Medical Advance Directive? No No No No No No No  Does patient want to make changes to medical advance directive? - - - - Yes (ED - Information included in AVS) - -  Would patient like information on creating a medical advance directive? No - Patient declined No - Patient declined No - Patient declined Yes (ED - Information included in AVS) - No - patient declined information -    Current Medications  (verified) Outpatient Encounter Medications as of 03/03/2021  Medication Sig   albuterol (VENTOLIN HFA) 108 (90 Base) MCG/ACT inhaler TAKE 2 PUFFS BY MOUTH EVERY 6 HOURS AS NEEDED FOR WHEEZE   amLODipine (NORVASC) 10 MG tablet Take 1 tablet (10 mg total) by mouth daily.   ammonium lactate (LAC-HYDRIN) 12 % lotion Apply 1 application topically as needed for dry skin.   aspirin 81 MG EC tablet TAKE 1 TABLET BY MOUTH DAILY. SWALLOW WHOLE.   atorvastatin (LIPITOR) 80 MG tablet Take 1 tablet (80 mg total) by mouth daily.   Cholecalciferol 50 MCG (2000 UT) TABS 1 tab by mouth once daily   clotrimazole (LOTRIMIN) 1 % cream Apply to affected feet and between toes twice daily for 6 weeks for athlete's feet   clotrimazole-betamethasone (LOTRISONE) cream Apply to both feet and between toes bid x 4 weeks.   losartan (COZAAR) 25 MG tablet Take 1 tablet (25 mg total) by mouth daily.   Multiple Vitamins-Minerals (WOMENS MULTIVITAMIN PO) Take 1 tablet by mouth daily.   ONETOUCH DELICA LANCETS 12X MISC 1 each by Does not apply route 2 (two) times daily. Use to help check blood sugars twice a day Dx E11.9   ONETOUCH VERIO test strip USE 2 (TWO) TIMES DAILY. DX E11.9   pioglitazone (ACTOS) 15 MG tablet TAKE 1 TABLET BY MOUTH EVERY DAY   potassium chloride (KLOR-CON) 10 MEQ tablet TAKE 1 TABLET BY MOUTH EVERY DAY   vitamin B-12 (CYANOCOBALAMIN) 1000 MCG tablet 1 tab by mouth mon - wed - thur   No facility-administered encounter medications on file as of 03/03/2021.    Allergies (verified) Januvia [sitagliptin phosphate], Metformin and  related, and Doxycycline   History: Past Medical History:  Diagnosis Date   ANEMIA-IRON DEFICIENCY 12/06/2009   Anxiety state 07/01/2015   Asthma 12/05/2011   DIABETES MELLITUS, TYPE II 09/17/2006   DIVERTICULOSIS, COLON 12/06/2009   GENITAL HERPES 03/24/2010   GERD (gastroesophageal reflux disease) 06/28/2011   HYPERLIPIDEMIA 03/24/2010   HYPERTENSION 09/17/2006   HYPOTHYROIDISM  09/17/2006   LIPOMA 12/06/2009   PERIPHERAL EDEMA 03/24/2010   Renal cyst 07/05/2011   1.9 cm minimally complex   Past Surgical History:  Procedure Laterality Date   ABDOMINAL HYSTERECTOMY     fibroids   ankle surgury     x 3 left - chronic pain/swelling   COLONOSCOPY  2011   SHOULDER SURGERY Left    TONSILLECTOMY     TUBAL LIGATION     Family History  Problem Relation Age of Onset   Hypertension Mother    Hypertension Father    Heart disease Sister        sudden death early 2's   Stroke Sister    Breast cancer Sister 72   Prostate cancer Brother    Diabetes Other    Joint hypermobility Other        DJD   Colon cancer Neg Hx    Colon polyps Neg Hx    Esophageal cancer Neg Hx    Stomach cancer Neg Hx    Rectal cancer Neg Hx    Social History   Socioeconomic History   Marital status: Divorced    Spouse name: Not on file   Number of children: 7   Years of education: Not on file   Highest education level: Not on file  Occupational History   Occupation: CASHIER/ retired    Comment: gas station, stands for work  Tobacco Use   Smoking status: Never   Smokeless tobacco: Never  Vaping Use   Vaping Use: Never used  Substance and Sexual Activity   Alcohol use: No    Alcohol/week: 0.0 standard drinks   Drug use: No   Sexual activity: Not Currently  Other Topics Concern   Not on file  Social History Narrative   Lives with grandson and and pets;    Have 6 sons and one dtr   Have grand- children; running children through out the day and cooking   Social Determinants of Radio broadcast assistant Strain: Not on file  Food Insecurity: Not on file  Transportation Needs: No Transportation Needs   Lack of Transportation (Medical): No   Lack of Transportation (Non-Medical): No  Physical Activity: Sufficiently Active   Days of Exercise per Week: 5 days   Minutes of Exercise per Session: 30 min  Stress: Not on file  Social Connections: Moderately Integrated    Frequency of Communication with Friends and Family: More than three times a week   Frequency of Social Gatherings with Friends and Family: More than three times a week   Attends Religious Services: More than 4 times per year   Active Member of [employer] or Organizations: No   Attends Music therapist: More than 4 times per year   Marital Status: Divorced    Tobacco Counseling Counseling given: Not Answered   Clinical Intake:  Pre-visit preparation completed: Yes  Pain : No/denies pain     Nutritional Risks: None Diabetes: Yes CBG done?: No Did pt. bring in CBG monitor from home?: No  How often do you need to have someone help you when you read instructions, pamphlets, or  other written materials from your doctor or pharmacy?: 1 - Never What is the last grade level you completed in school?: GED; 1 year of college  Diabetic? yes  Interpreter Needed?: No  Information entered by :: Lisette Abu, LPN   Activities of Daily Living In your present state of health, do you have any difficulty performing the following activities: 03/03/2021  Hearing? N  Vision? N  Difficulty concentrating or making decisions? N  Walking or climbing stairs? N  Dressing or bathing? N  Doing errands, shopping? N  Preparing Food and eating ? N  Using the Toilet? N  In the past six months, have you accidently leaked urine? N  Do you have problems with loss of bowel control? N  Managing your Medications? N  Managing your Finances? N  Housekeeping or managing your Housekeeping? N  Some recent data might be hidden    Patient Care Team: Biagio Borg, MD as PCP - General Lyndal Pulley, DO as Consulting Physician (Family Medicine) Marzetta Board, DPM as Consulting Physician (Podiatry) Zadie Rhine Clent Demark, MD as Consulting Physician (Ophthalmology) Webb Laws, [place] as Referring Physician (Optometry)  Indicate any recent Medical Services you may have received from other than  [facility] providers in the past year (date may be approximate).     Assessment:   This is a routine wellness examination for [place].  Hearing/Vision screen Hearing Screening - Comments:: Patient denied any hearing difficulty.   No hearing aids.  Vision Screening - Comments:: Patient wears corrective glasses/contacts.  Eye exam done annually by: Deloria Lair, MD.  Dietary issues and exercise activities discussed: Current Exercise Habits: Home exercise routine, Type of exercise: treadmill;walking;Other - see comments (dancing), Time (Minutes): 30, Frequency (Times/Week): 5, Weekly Exercise (Minutes/Week): 150, Intensity: Moderate, Exercise limited by: orthopedic condition(s)   Goals Addressed               This Visit's Progress     Patient Stated (pt-stated)        It's time to think about me now; I should be priority.      Depression Screen PHQ 2/9 Scores 03/03/2021 07/12/2020 07/12/2020 01/12/2020 01/11/2020 07/08/2019 01/06/2019  PHQ - 2 Score 0 0 0 0 0 0 1  PHQ- 9 Score - - - - - - 1    Fall Risk Fall Risk  03/03/2021 07/12/2020 07/12/2020 01/12/2020 01/11/2020  Falls in the past year? 0 0 0 0 0  Number falls in past yr: 0 0 0 0 -  Injury with Fall? 0 0 0 0 -  Comment - - - - -  Risk for fall due to : No Fall Risks - - No Fall Risks -  Follow up Falls evaluation completed - - Falls evaluation completed -    FALL RISK PREVENTION PERTAINING TO THE HOME:  Any stairs in or around the home? No  If so, are there any without handrails? No  Home free of loose throw rugs in walkways, pet beds, electrical cords, etc? Yes  Adequate lighting in your home to reduce risk of falls? Yes   ASSISTIVE DEVICES UTILIZED TO PREVENT FALLS:  Life alert? No  Use of a cane, walker or w/c? No  Grab bars in the bathroom? No  Shower chair or bench in shower? No  Elevated toilet seat or a handicapped toilet? No   TIMED UP AND GO:  Was the test performed? No .  Length of time to ambulate 10 feet:  n/a sec.  Gait steady and fast without use of assistive device  Cognitive Function: Normal cognitive status assessed by direct observation by this Nurse Health Advisor. No abnormalities found.   MMSE - Mini Mental State Exam 11/14/2016 06/29/2014  Not completed: - Unable to complete  Orientation to time 5 -  Orientation to Place 5 -  Registration 3 -  Attention/ Calculation 4 -  Recall 2 -  Language- name 2 objects 2 -  Language- repeat 1 -  Language- follow 3 step command 3 -  Language- read & follow direction 1 -  Write a sentence 1 -  Copy design 1 -  Total score 28 -     6CIT Screen 01/12/2020  What Year? 0 points  What month? 0 points  What time? 0 points  Count back from 20 0 points  Months in reverse 0 points  Repeat phrase 0 points  Total Score 0    Immunizations Immunization History  Administered Date(s) Administered   Fluad Quad(high Dose 65+) 01/06/2019, 01/11/2021   Influenza Split 11/21/2010, 12/05/2011   Influenza Whole 12/06/2009   Influenza, High Dose Seasonal PF 12/04/2012, 12/04/2013, 11/14/2016, 11/15/2017   Influenza,inj,Quad PF,6+ Mos 12/15/2014   PFIZER(Purple Top)SARS-COV-2 Vaccination 05/23/2019, 05/27/2019, 06/13/2019   Pneumococcal Conjugate-13 12/19/2012   Pneumococcal Polysaccharide-23 11/27/2004, 12/06/2009, 06/25/2017   Td 11/27/2004, 06/08/2014    TDAP status: Up to date  Flu Vaccine status: Up to date  Pneumococcal vaccine status: Up to date  Covid-19 vaccine status: Completed vaccines  Qualifies for Shingles Vaccine? Yes   Zostavax completed No   Shingrix Completed?: No.    Education has been provided regarding the importance of this vaccine. Patient has been advised to call insurance company to determine out of pocket expense if they have not yet received this vaccine. Advised may also receive vaccine at local pharmacy or Health Dept. Verbalized acceptance and understanding.  Screening Tests Health Maintenance  Topic Date Due    Zoster Vaccines- Shingrix (1 of 2) Never done   COVID-19 Vaccine (4 - Booster) 08/08/2019   OPHTHALMOLOGY EXAM  06/10/2020   HEMOGLOBIN A1C  07/11/2021   FOOT EXAM  07/12/2021   TETANUS/TDAP  06/07/2024   Pneumonia Vaccine 10+ Years old  Completed   INFLUENZA VACCINE  Completed   DEXA SCAN  Completed   Hepatitis C Screening  Completed   HPV VACCINES  Aged Out   COLONOSCOPY (Pts 45-32yrs Insurance coverage will need to be confirmed)  Discontinued    Health Maintenance  Health Maintenance Due  Topic Date Due   Zoster Vaccines- Shingrix (1 of 2) Never done   COVID-19 Vaccine (4 - Booster) 08/08/2019   OPHTHALMOLOGY EXAM  06/10/2020    Colorectal cancer screening: No longer required.  (Last done 04/21/2020)  Mammogram status: Completed 01/24/2021. Repeat every year  Bone Density status: Completed 12/16/2015. Results reflect: Bone density results: OSTEOPENIA. Repeat every 2-3 years.  Lung Cancer Screening: (Low Dose CT Chest recommended if Age 59-80 years, 30 pack-year currently smoking OR have quit w/in 15years.) does not qualify.   Lung Cancer Screening Referral: no  Additional Screening:  Hepatitis C Screening: does qualify; Completed yes  Vision Screening: Recommended annual ophthalmology exams for early detection of glaucoma and other disorders of the eye. Is the patient up to date with their annual eye exam?  Yes  Who is the provider or what is the name of the office in which the patient attends annual eye exams? Deloria Lair, MD. If pt is not established with  a provider, would they like to be referred to a provider to establish care? No .   Dental Screening: Recommended annual dental exams for proper oral hygiene  Community Resource Referral / Chronic Care Management: CRR required this visit?  No   CCM required this visit?  No      Plan:     I have personally reviewed and noted the following in the patients chart:   Medical and social history Use of  alcohol, tobacco or illicit drugs  Current medications and supplements including opioid prescriptions.  Functional ability and status Nutritional status Physical activity Advanced directives List of other physicians Hospitalizations, surgeries, and ER visits in previous 12 months Vitals Screenings to include cognitive, depression, and falls Referrals and appointments  In addition, I have reviewed and discussed with patient certain preventive protocols, quality metrics, and best practice recommendations. A written personalized care plan for preventive services as well as general preventive health recommendations were provided to patient.     Sheral Flow, LPN   03/03/9676   Nurse Notes:  Patient is cogitatively intact. There were no vitals filed for this visit. There is no height or weight on file to calculate BMI. Patient stated that she has no issues with gait or balance; does not use any assistive devices. Medications reviewed with patient; no opioid use noted. Hearing Screening - Comments:: Patient denied any hearing difficulty.   No hearing aids.  Vision Screening - Comments:: Patient wears corrective glasses/contacts.  Eye exam done annually by: Deloria Lair, MD.

## 2021-03-07 ENCOUNTER — Other Ambulatory Visit: Payer: Self-pay | Admitting: Internal Medicine

## 2021-03-07 NOTE — Telephone Encounter (Signed)
Please refill as per office routine med refill policy (all routine meds to be refilled for 3 mo or monthly (per pt preference) up to one year from last visit, then month to month grace period for 3 mo, then further med refills will have to be denied) ? ?

## 2021-03-10 ENCOUNTER — Other Ambulatory Visit: Payer: Self-pay

## 2021-03-10 ENCOUNTER — Ambulatory Visit (INDEPENDENT_AMBULATORY_CARE_PROVIDER_SITE_OTHER): Payer: Medicare Other | Admitting: Podiatry

## 2021-03-10 ENCOUNTER — Encounter: Payer: Self-pay | Admitting: Podiatry

## 2021-03-10 DIAGNOSIS — M79675 Pain in left toe(s): Secondary | ICD-10-CM

## 2021-03-10 DIAGNOSIS — B351 Tinea unguium: Secondary | ICD-10-CM | POA: Diagnosis not present

## 2021-03-10 DIAGNOSIS — M79674 Pain in right toe(s): Secondary | ICD-10-CM

## 2021-03-13 NOTE — Progress Notes (Signed)
Subjective: Brandi Shaffer is a 76 y.o. female patient seen today for follow up of  painful elongated mycotic toenails 1-5 bilaterally which are tender when wearing enclosed shoe gear. Pain is relieved with periodic professional debridement..   New problems reported today: None.  Patient did not check blood glucose on today.  PCP is Brandi Borg, MD. Last visit was: 01/11/2021.  Allergies  Allergen Reactions   Januvia [Sitagliptin Phosphate]     dizziness   Metformin And Related Nausea Only   Doxycycline Rash    Objective: Physical Exam  General: Patient is a pleasant 76 y.o. African American female WD, WN in NAD. AAO x 3.   Neurovascular Examination: CFT immediate b/l LE. Palpable DP/PT pulses b/l LE. Digital hair present b/l. Skin temperature gradient WNL b/l. No pain with calf compression b/l. No edema noted b/l. No cyanosis or clubbing noted b/l LE.  Protective sensation intact 5/5 intact bilaterally with 10g monofilament b/l. Vibratory sensation intact b/l.  Dermatological:  Pedal integument with normal turgor, texture and tone b/l LE. No open wounds b/l. No interdigital macerations b/l. Toenails 1-5 right, L 2nd toe, L 3rd toe, and L 4th toe elongated, thickened, discolored with subungual debris. +Tenderness with dorsal palpation of nailplates. No hyperkeratotic or porokeratotic lesions present. Anonychia noted L hallux and L 5th toe. Nailbed(s) epithelialized.   Musculoskeletal:  Muscle strength 5/5 to all lower extremity muscle groups bilaterally. Bony prominence left dorsal midfoot. Limited joint ROM to the left ankle.  Assessment: 1. Pain due to onychomycosis of toenails of both feet    Plan: Patient was evaluated and treated and all questions answered. Consent given for treatment as described below: -Continue diabetic foot care principles: inspect feet daily, monitor glucose as recommended by PCP and/or Endocrinologist, and follow prescribed diet per PCP,  Endocrinologist and/or dietician. -Mycotic toenails 1-5 right, L 2nd toe, L 3rd toe, and L 4th toe were debrided in length and girth with sterile nail nippers and dremel without iatrogenic bleeding. -Patient/POA to call should there be question/concern in the interim.  Return in about 3 months (around 06/08/2021).  Marzetta Board, DPM

## 2021-03-21 ENCOUNTER — Other Ambulatory Visit: Payer: Self-pay | Admitting: Internal Medicine

## 2021-03-21 NOTE — Telephone Encounter (Signed)
Please refill as per office routine med refill policy (all routine meds to be refilled for 3 mo or monthly (per pt preference) up to one year from last visit, then month to month grace period for 3 mo, then further med refills will have to be denied) ? ?

## 2021-03-27 ENCOUNTER — Ambulatory Visit (INDEPENDENT_AMBULATORY_CARE_PROVIDER_SITE_OTHER): Payer: Commercial Managed Care - HMO | Admitting: Ophthalmology

## 2021-03-27 ENCOUNTER — Other Ambulatory Visit: Payer: Self-pay

## 2021-03-27 ENCOUNTER — Encounter (INDEPENDENT_AMBULATORY_CARE_PROVIDER_SITE_OTHER): Payer: Self-pay | Admitting: Ophthalmology

## 2021-03-27 DIAGNOSIS — E119 Type 2 diabetes mellitus without complications: Secondary | ICD-10-CM

## 2021-03-27 DIAGNOSIS — H43812 Vitreous degeneration, left eye: Secondary | ICD-10-CM | POA: Diagnosis not present

## 2021-03-27 DIAGNOSIS — H43811 Vitreous degeneration, right eye: Secondary | ICD-10-CM

## 2021-03-27 DIAGNOSIS — H2513 Age-related nuclear cataract, bilateral: Secondary | ICD-10-CM | POA: Diagnosis not present

## 2021-03-27 NOTE — Assessment & Plan Note (Signed)
Moderate NSC changes progressive

## 2021-03-27 NOTE — Assessment & Plan Note (Signed)
Physiologic 

## 2021-03-27 NOTE — Assessment & Plan Note (Signed)
No detectable diabetic retinopathy today 

## 2021-03-27 NOTE — Progress Notes (Signed)
03/27/2021     CHIEF COMPLAINT Patient presents for  Chief Complaint  Patient presents with   Retina Follow Up      HISTORY OF PRESENT ILLNESS: Brandi Shaffer is a 76 y.o. female who presents to the clinic today for:   HPI     Retina Follow Up           Diagnosis: PVD   Laterality: right eye   Onset: 1 year ago   Severity: mild   Duration: 1 year   Course: stable         Comments   1 yr fu OU oct. Patient states vision is stable and unchanged since last visit. Denies any new floaters or FOL.       Last edited by Laurin Coder on 03/27/2021 10:16 AM.      Referring physician: Biagio Borg, MD [place],  [place] [id number]  HISTORICAL INFORMATION:   Selected notes from the MEDICAL RECORD NUMBER    Lab Results  Component Value Date   HGBA1C 5.8 01/11/2021     CURRENT MEDICATIONS: No current outpatient medications on file. (Ophthalmic Drugs)   No current facility-administered medications for this visit. (Ophthalmic Drugs)   Current Outpatient Medications (Other)  Medication Sig   albuterol (VENTOLIN HFA) 108 (90 Base) MCG/ACT inhaler TAKE 2 PUFFS BY MOUTH EVERY 6 HOURS AS NEEDED FOR WHEEZE   amLODipine (NORVASC) 10 MG tablet TAKE 1 TABLET BY MOUTH EVERY DAY   ammonium lactate (LAC-HYDRIN) 12 % lotion Apply 1 application topically as needed for dry skin.   aspirin 81 MG EC tablet TAKE 1 TABLET BY MOUTH DAILY. SWALLOW WHOLE.   atorvastatin (LIPITOR) 80 MG tablet Take 1 tablet (80 mg total) by mouth daily.   Cholecalciferol 50 MCG (2000 UT) TABS 1 tab by mouth once daily   clotrimazole (LOTRIMIN) 1 % cream Apply to affected feet and between toes twice daily for 6 weeks for athlete's feet   clotrimazole-betamethasone (LOTRISONE) cream Apply to both feet and between toes bid x 4 weeks.   losartan (COZAAR) 25 MG tablet Take 1 tablet (25 mg total) by mouth daily.   Multiple Vitamins-Minerals (WOMENS MULTIVITAMIN PO) Take 1 tablet by mouth  daily.   ONETOUCH DELICA LANCETS 12W MISC 1 each by Does not apply route 2 (two) times daily. Use to help check blood sugars twice a day Dx E11.9   ONETOUCH VERIO test strip USE 2 (TWO) TIMES DAILY. DX E11.9   pioglitazone (ACTOS) 15 MG tablet TAKE 1 TABLET BY MOUTH EVERY DAY   potassium chloride (KLOR-CON) 10 MEQ tablet TAKE 1 TABLET BY MOUTH EVERY DAY   vitamin B-12 (CYANOCOBALAMIN) 1000 MCG tablet 1 tab by mouth mon - wed - thur   No current facility-administered medications for this visit. (Other)      REVIEW OF SYSTEMS:    ALLERGIES Allergies  Allergen Reactions   Januvia [Sitagliptin Phosphate]     dizziness   Metformin And Related Nausea Only   Doxycycline Rash    PAST MEDICAL HISTORY Past Medical History:  Diagnosis Date   ANEMIA-IRON DEFICIENCY 12/06/2009   Anxiety state 07/01/2015   Asthma 12/05/2011   DIABETES MELLITUS, TYPE II 09/17/2006   DIVERTICULOSIS, COLON 12/06/2009   GENITAL HERPES 03/24/2010   GERD (gastroesophageal reflux disease) 06/28/2011   HYPERLIPIDEMIA 03/24/2010   HYPERTENSION 09/17/2006   HYPOTHYROIDISM 09/17/2006   LIPOMA 12/06/2009   PERIPHERAL EDEMA 03/24/2010   Renal cyst 07/05/2011  1.9 cm minimally complex   Past Surgical History:  Procedure Laterality Date   ABDOMINAL HYSTERECTOMY     fibroids   ankle surgury     x 3 left - chronic pain/swelling   COLONOSCOPY  2011   SHOULDER SURGERY Left    TONSILLECTOMY     TUBAL LIGATION      FAMILY HISTORY Family History  Problem Relation Age of Onset   Hypertension Mother    Hypertension Father    Heart disease Sister        sudden death early 50's   Stroke Sister    Breast cancer Sister 71   Prostate cancer Brother    Diabetes Other    Joint hypermobility Other        DJD   Colon cancer Neg Hx    Colon polyps Neg Hx    Esophageal cancer Neg Hx    Stomach cancer Neg Hx    Rectal cancer Neg Hx     SOCIAL HISTORY Social History   Tobacco Use   Smoking status: Never   Smokeless  tobacco: Never  Vaping Use   Vaping Use: Never used  Substance Use Topics   Alcohol use: No    Alcohol/week: 0.0 standard drinks   Drug use: No         OPHTHALMIC EXAM:  Base Eye Exam     Visual Acuity (ETDRS)       Right Left   Dist cc 20/25 -2+2 20/20 -2    Correction: Glasses         Tonometry (Tonopen, 10:18 AM)       Right Left   Pressure 12 13         Pupils       Pupils Dark Light APD   Right PERRL 4 3 None   Left PERRL 4 3 None         Visual Fields (Counting fingers)       Left Right    Full Full         Extraocular Movement       Right Left    Full Full         Neuro/Psych     Oriented x3: Yes   Mood/Affect: Normal         Dilation     Both eyes: 1.0% Mydriacyl, 2.5% Phenylephrine @ 10:18 AM           Slit Lamp and Fundus Exam     External Exam       Right Left   External Normal Normal         Slit Lamp Exam       Right Left   Lids/Lashes Normal Normal   Conjunctiva/Sclera White and quiet White and quiet   Cornea Clear Clear   Anterior Chamber Deep and quiet Deep and quiet   Iris Round and reactive Round and reactive   Lens 2+ Nuclear sclerosis 2+ Nuclear sclerosis   Anterior Vitreous Normal Normal         Fundus Exam       Right Left   Posterior Vitreous Posterior vitreous detachment Posterior vitreous detachment   Disc Normal Normal   C/D Ratio 0.6 0.6   Macula Normal Normal   Vessels no DR no DR   Periphery Normal Normal            IMAGING AND PROCEDURES  Imaging and Procedures for 03/27/21  OCT, Retina - OU - Both Eyes  Right Eye Quality was good. Scan locations included subfoveal. Central Foveal Thickness: 246. Progression has been stable. Findings include normal observations, normal foveal contour.   Left Eye Quality was good. Scan locations included subfoveal. Central Foveal Thickness: 235. Progression has been stable. Findings include normal foveal contour, normal  observations.   Notes PVD OU, stable over time             ASSESSMENT/PLAN:  Diabetes mellitus without complication (HCC) No detectable diabetic retinopathy today  Nuclear sclerotic cataract of both eyes Moderate NSC changes progressive  Posterior vitreous detachment of right eye Physiologic  Posterior vitreous detachment of left eye Physiologic     ICD-10-CM   1. Posterior vitreous detachment of right eye  H43.811 OCT, Retina - OU - Both Eyes    2. Diabetes mellitus without complication ([place])  G95.6     3. Nuclear sclerotic cataract of both eyes  H25.13     4. Posterior vitreous detachment of left eye  H43.812       1.  No detectable diabetic retinopathy  2.  Mild NSC changes no impact on acuity  3.  Ophthalmic Meds Ordered this visit:  No orders of the defined types were placed in this encounter.      Return in about 18 months (around 09/25/2022) for DILATE OU, COLOR FP, OCT.  There are no Patient Instructions on file for this visit.   Explained the diagnoses, plan, and follow up with the patient and they expressed understanding.  Patient expressed understanding of the importance of proper follow up care.   Clent Demark Lynell Greenhouse M.D. Diseases & Surgery of the Retina and Vitreous [facility] 03/27/21     Abbreviations: M myopia (nearsighted); A astigmatism; H hyperopia (farsighted); P presbyopia; Mrx spectacle prescription;  CTL contact lenses; OD right eye; OS left eye; OU both eyes  XT exotropia; ET esotropia; PEK punctate epithelial keratitis; PEE punctate epithelial erosions; DES dry eye syndrome; MGD meibomian gland dysfunction; ATs artificial tears; PFAT's preservative free artificial tears; [place] nuclear sclerotic cataract; PSC posterior subcapsular cataract; ERM epi-retinal membrane; PVD posterior vitreous detachment; RD retinal detachment; DM diabetes mellitus; DR diabetic retinopathy; NPDR non-proliferative diabetic retinopathy; PDR  proliferative diabetic retinopathy; CSME clinically significant macular edema; DME diabetic macular edema; dbh dot blot hemorrhages; CWS cotton wool spot; POAG primary open angle glaucoma; C/D cup-to-disc ratio; HVF humphrey visual field; GVF goldmann visual field; OCT optical coherence tomography; IOP intraocular pressure; BRVO Branch retinal vein occlusion; CRVO central retinal vein occlusion; CRAO central retinal artery occlusion; BRAO branch retinal artery occlusion; RT retinal tear; SB scleral buckle; PPV pars plana vitrectomy; VH Vitreous hemorrhage; PRP panretinal laser photocoagulation; IVK intravitreal kenalog; VMT vitreomacular traction; MH Macular hole;  NVD neovascularization of the disc; NVE neovascularization elsewhere; AREDS age related eye disease study; ARMD age related macular degeneration; POAG primary open angle glaucoma; EBMD epithelial/anterior basement membrane dystrophy; ACIOL anterior chamber intraocular lens; IOL intraocular lens; PCIOL posterior chamber intraocular lens; Phaco/IOL phacoemulsification with intraocular lens placement; [place] photorefractive keratectomy; LASIK laser assisted in situ keratomileusis; HTN hypertension; DM diabetes mellitus; COPD chronic obstructive pulmonary disease

## 2021-04-01 ENCOUNTER — Other Ambulatory Visit: Payer: Self-pay | Admitting: Internal Medicine

## 2021-04-01 NOTE — Telephone Encounter (Signed)
Please refill as per office routine med refill policy (all routine meds to be refilled for 3 mo or monthly (per pt preference) up to one year from last visit, then month to month grace period for 3 mo, then further med refills will have to be denied) ? ?

## 2021-06-14 ENCOUNTER — Ambulatory Visit (INDEPENDENT_AMBULATORY_CARE_PROVIDER_SITE_OTHER): Payer: Medicare Other | Admitting: Podiatry

## 2021-06-14 ENCOUNTER — Encounter: Payer: Self-pay | Admitting: Podiatry

## 2021-06-14 DIAGNOSIS — B351 Tinea unguium: Secondary | ICD-10-CM | POA: Diagnosis not present

## 2021-06-14 DIAGNOSIS — M79674 Pain in right toe(s): Secondary | ICD-10-CM | POA: Diagnosis not present

## 2021-06-14 DIAGNOSIS — M79675 Pain in left toe(s): Secondary | ICD-10-CM | POA: Diagnosis not present

## 2021-06-14 DIAGNOSIS — E119 Type 2 diabetes mellitus without complications: Secondary | ICD-10-CM

## 2021-06-17 ENCOUNTER — Other Ambulatory Visit: Payer: Self-pay | Admitting: Internal Medicine

## 2021-06-17 NOTE — Telephone Encounter (Signed)
Please refill as per office routine med refill policy (all routine meds to be refilled for 3 mo or monthly (per pt preference) up to one year from last visit, then month to month grace period for 3 mo, then further med refills will have to be denied) ? ?

## 2021-06-20 ENCOUNTER — Other Ambulatory Visit: Payer: Self-pay | Admitting: Internal Medicine

## 2021-06-20 ENCOUNTER — Other Ambulatory Visit: Payer: Self-pay | Admitting: Podiatry

## 2021-06-20 DIAGNOSIS — L853 Xerosis cutis: Secondary | ICD-10-CM

## 2021-06-20 NOTE — Telephone Encounter (Signed)
Please refill as per office routine med refill policy (all routine meds to be refilled for 3 mo or monthly (per pt preference) up to one year from last visit, then month to month grace period for 3 mo, then further med refills will have to be denied) ? ?

## 2021-06-24 NOTE — Progress Notes (Signed)
?  Subjective:  ?Patient ID: Brandi Shaffer, female    DOB: 1946/01/23,  MRN: [id number] ? ?Brandi Shaffer presents to clinic today for painful thick toenails that are difficult to trim. Pain interferes with ambulation. Aggravating factors include wearing enclosed shoe gear. Pain is relieved with periodic professional debridement. ? ?Last known HgA1c was 7.0%. Patient did not check blood glucose today. ? ?New problem(s): None.  ? ?PCP is Corwin Levins, MD , and last visit was January 11, 2021. ? ?Allergies  ?Allergen Reactions  ? Januvia [Sitagliptin Phosphate]   ?  dizziness  ? Metformin And Related Nausea Only  ? Doxycycline Rash  ? ? ?Review of Systems: Negative except as noted in the HPI. ? ?Objective: No changes noted in today's physical examination. ?General: Patient is a pleasant 76 y.o. African American female WD, WN in NAD. AAO x 3.  ? ?Neurovascular Examination: ?CFT immediate b/l LE. Palpable DP/PT pulses b/l LE. Digital hair present b/l. Skin temperature gradient WNL b/l. No pain with calf compression b/l. No edema noted b/l. No cyanosis or clubbing noted b/l LE. ? ?Protective sensation intact 5/5 intact bilaterally with 10g monofilament b/l. Vibratory sensation intact b/l. ? ?Dermatological:  ?Pedal integument with normal turgor, texture and tone b/l LE. No open wounds b/l. No interdigital macerations b/l. Toenails 1-5 right, L 2nd toe, L 3rd toe, and L 4th toe elongated, thickened, discolored with subungual debris. +Tenderness with dorsal palpation of nailplates. No hyperkeratotic or porokeratotic lesions present. Anonychia noted L hallux and L 5th toe. Nailbed(s) epithelialized.  ? ?Musculoskeletal:  ?Muscle strength 5/5 to all lower extremity muscle groups bilaterally. Bony prominence left dorsal midfoot. Limited joint ROM to the left ankle. ? ? ?  Latest Ref Rng & Units 01/11/2021  ?  9:15 AM 07/12/2020  ? 10:55 AM  ?Hemoglobin A1C  ?Hemoglobin-A1c 4.6 - 6.5 % 5.8   5.9    ? ?Assessment/Plan: ?1.  Pain due to onychomycosis of toenails of both feet   ?2. Controlled type 2 diabetes mellitus without complication, without long-term current use of insulin (HCC)   ?-Examined patient. ?-Patient to continue soft, supportive shoe gear daily. ?-Toenails 1-5 right foot, left second digit, left third digit, and left fourth digit debrided in length and girth without iatrogenic bleeding with sterile nail nipper and dremel.  ?-Patient/POA to call should there be question/concern in the interim.  ? ?Return in about 3 months (around 09/13/2021). ? ?[name], DPM  ?

## 2021-07-11 ENCOUNTER — Ambulatory Visit (INDEPENDENT_AMBULATORY_CARE_PROVIDER_SITE_OTHER): Payer: Medicare Other | Admitting: Internal Medicine

## 2021-07-11 ENCOUNTER — Encounter: Payer: Self-pay | Admitting: Internal Medicine

## 2021-07-11 VITALS — BP 120/76 | HR 70 | Temp 98.2°F | Ht 59.0 in | Wt 179.0 lb

## 2021-07-11 DIAGNOSIS — E559 Vitamin D deficiency, unspecified: Secondary | ICD-10-CM

## 2021-07-11 DIAGNOSIS — E7800 Pure hypercholesterolemia, unspecified: Secondary | ICD-10-CM | POA: Diagnosis not present

## 2021-07-11 DIAGNOSIS — E1165 Type 2 diabetes mellitus with hyperglycemia: Secondary | ICD-10-CM | POA: Diagnosis not present

## 2021-07-11 DIAGNOSIS — I10 Essential (primary) hypertension: Secondary | ICD-10-CM

## 2021-07-11 DIAGNOSIS — E538 Deficiency of other specified B group vitamins: Secondary | ICD-10-CM | POA: Diagnosis not present

## 2021-07-11 DIAGNOSIS — R609 Edema, unspecified: Secondary | ICD-10-CM

## 2021-07-11 DIAGNOSIS — Z0001 Encounter for general adult medical examination with abnormal findings: Secondary | ICD-10-CM | POA: Diagnosis not present

## 2021-07-11 LAB — BASIC METABOLIC PANEL
BUN: 14 mg/dL (ref 6–23)
CO2: 28 mEq/L (ref 19–32)
Calcium: 9.3 mg/dL (ref 8.4–10.5)
Chloride: 108 mEq/L (ref 96–112)
Creatinine, Ser: 0.95 mg/dL (ref 0.40–1.20)
GFR: 58.18 mL/min — ABNORMAL LOW (ref >60.00)
Glucose, Bld: 83 mg/dL (ref 70–99)
Potassium: 3.8 mEq/L (ref 3.5–5.1)
Sodium: 143 mEq/L (ref 135–145)

## 2021-07-11 LAB — MICROALBUMIN / CREATININE URINE RATIO
Creatinine,U: 88.6 mg/dL
Microalb Creat Ratio: 0.8 mg/g (ref 0.0–30.0)
Microalb, Ur: <0.7 mg/dL (ref 0.0–1.9)

## 2021-07-11 LAB — URINALYSIS, ROUTINE W REFLEX MICROSCOPIC
Bilirubin Urine: NEGATIVE
Hgb urine dipstick: NEGATIVE
Ketones, ur: NEGATIVE
Nitrite: NEGATIVE
Specific Gravity, Urine: 1.015 (ref 1.000–1.030)
Total Protein, Urine: NEGATIVE
Urine Glucose: NEGATIVE
Urobilinogen, UA: 0.2 (ref 0.0–1.0)
pH: 6 (ref 5.0–8.0)

## 2021-07-11 LAB — CBC WITH DIFFERENTIAL/PLATELET
Basophils Absolute: 0 10*3/uL (ref 0.0–0.1)
Basophils Relative: 0.8 % (ref 0.0–3.0)
Eosinophils Absolute: 0.2 10*3/uL (ref 0.0–0.7)
Eosinophils Relative: 3.8 % (ref 0.0–5.0)
HCT: 38.5 % (ref 36.0–46.0)
Hemoglobin: 12.7 g/dL (ref 12.0–15.0)
Lymphocytes Relative: 45.5 % (ref 12.0–46.0)
Lymphs Abs: 2.3 10*3/uL (ref 0.7–4.0)
MCHC: 32.9 g/dL (ref 30.0–36.0)
MCV: 93.4 fl (ref 78.0–100.0)
Monocytes Absolute: 0.5 10*3/uL (ref 0.1–1.0)
Monocytes Relative: 10.5 % (ref 3.0–12.0)
Neutro Abs: 2 10*3/uL (ref 1.4–7.7)
Neutrophils Relative %: 39.4 % — ABNORMAL LOW (ref 43.0–77.0)
Platelets: 234 10*3/uL (ref 150.0–400.0)
RBC: 4.12 Mil/uL (ref 3.87–5.11)
RDW: 15.2 % (ref 11.5–15.5)
WBC: 5.1 10*3/uL (ref 4.0–10.5)

## 2021-07-11 LAB — HEPATIC FUNCTION PANEL
ALT: 17 U/L (ref 0–35)
AST: 21 U/L (ref 0–37)
Albumin: 4.3 g/dL (ref 3.5–5.2)
Alkaline Phosphatase: 96 U/L (ref 39–117)
Bilirubin, Direct: 0.1 mg/dL (ref 0.0–0.3)
Total Bilirubin: 0.5 mg/dL (ref 0.2–1.2)
Total Protein: 6.9 g/dL (ref 6.0–8.3)

## 2021-07-11 LAB — TSH: TSH: 2.73 u[IU]/mL (ref 0.35–5.50)

## 2021-07-11 LAB — LIPID PANEL
Cholesterol: 197 mg/dL (ref 0–200)
HDL: 67.3 mg/dL (ref >39.00)
LDL Cholesterol: 118 mg/dL — ABNORMAL HIGH (ref 0–99)
NonHDL: 129.63
Total CHOL/HDL Ratio: 3
Triglycerides: 59 mg/dL (ref 0.0–149.0)
VLDL: 11.8 mg/dL (ref 0.0–40.0)

## 2021-07-11 LAB — VITAMIN B12: Vitamin B-12: 1215 pg/mL — ABNORMAL HIGH (ref 211–911)

## 2021-07-11 LAB — HEMOGLOBIN A1C: Hgb A1c MFr Bld: 5.6 % (ref 4.6–6.5)

## 2021-07-11 LAB — VITAMIN D 25 HYDROXY (VIT D DEFICIENCY, FRACTURES): VITD: 23.26 ng/mL — ABNORMAL LOW (ref 30.00–100.00)

## 2021-07-11 MED ORDER — HYDROCHLOROTHIAZIDE 12.5 MG PO CAPS: 12.5000 mg | ORAL_CAPSULE | Freq: Every day | ORAL | 3 refills | Status: DC

## 2021-07-11 MED ORDER — ROSUVASTATIN CALCIUM 40 MG PO TABS: 40.0000 mg | ORAL_TABLET | Freq: Every day | ORAL | 3 refills | Status: DC

## 2021-07-11 NOTE — Assessment & Plan Note (Signed)
Lab Results  ?Component Value Date  ? LDLCALC 127 (H) 01/11/2021  ? ?Uncontrolled, pt asks for change lipitor, will change to crestor 40 qd, and low chol diet ? ?

## 2021-07-11 NOTE — Assessment & Plan Note (Signed)
Lab Results  ?Component Value Date  ? [id number] 1,092 (H) 07/12/2020  ? ?Stable, cont oral replacement - b12 1000 mcg qd ? ?

## 2021-07-11 NOTE — Assessment & Plan Note (Signed)
Age and sex appropriate education and counseling updated with regular exercise and diet ?Referrals for preventative services - pt has appt for eye exam next month ?Immunizations addressed - none needed ?Smoking counseling  - none needed ?Evidence for depression or other mood disorder - none significant ?Most recent labs reviewed. ?I have personally reviewed and have noted: ?1) the patient's medical and social history ?2) The patient's current medications and supplements ?3) The patient's height, weight, and BMI have been recorded in the chart ? ?

## 2021-07-11 NOTE — Progress Notes (Signed)
Patient ID: Brandi Shaffer, female   DOB: 03/31/1945, 76 y.o.   MRN: [id number] ? ? ? ?     Chief Complaint:: wellness exam and low vit d, hld, leg swelling ? ?     HPI:  Brandi Shaffer is a 76 y.o. female here for wellness exam; plans for eye exam for June 2023; o/w up to date ?         ?              Also has slight worsening leg swelling, tends to sit with legs dependent most days, though quite active as well driving other and doing for others who need help.  Lost wt with better diet.  Pt denies chest pain, increased sob or doe, wheezing, orthopnea, PND, increased LE swelling, palpitations, dizziness or syncope.   Pt denies polydipsia, polyuria, or new focal neuro s/s.    Pt denies fever, wt loss, night sweats, loss of appetite, or other constitutional symptoms  Pt finds unable to tolerate lipitor 80, asks for change in med due to myalgias.   ?Wt Readings from Last 3 Encounters:  ?07/11/21 179 lb (81.2 kg)  ?01/11/21 182 lb (82.6 kg)  ?11/29/20 183 lb (83 kg)  ? ?BP Readings from Last 3 Encounters:  ?07/11/21 120/76  ?01/11/21 (!) 146/88  ?11/29/20 (!) 160/92  ? ?Immunization History  ?Administered Date(s) Administered  ? Fluad Quad(high Dose 65+) 01/06/2019, 01/11/2021  ? Influenza Split 11/21/2010, 12/05/2011  ? Influenza Whole 12/06/2009  ? Influenza, High Dose Seasonal PF 12/04/2012, 12/04/2013, 11/14/2016, 11/15/2017  ? Influenza,inj,Quad PF,6+ Mos 12/15/2014  ? PFIZER(Purple Top)SARS-COV-2 Vaccination 05/23/2019, 05/27/2019, 06/13/2019  ? Pneumococcal Conjugate-13 12/19/2012  ? Pneumococcal Polysaccharide-23 11/27/2004, 12/06/2009, 06/25/2017  ? Td 11/27/2004, 06/08/2014  ? ?Health Maintenance Due  ?Topic Date Due  ? HEMOGLOBIN A1C  07/11/2021  ? ?  ? ?Past Medical History:  ?Diagnosis Date  ? ANEMIA-IRON DEFICIENCY 12/06/2009  ? Anxiety state 07/01/2015  ? Asthma 12/05/2011  ? DIABETES MELLITUS, TYPE II 09/17/2006  ? DIVERTICULOSIS, COLON 12/06/2009  ? GENITAL HERPES 03/24/2010  ? GERD (gastroesophageal reflux  disease) 06/28/2011  ? HYPERLIPIDEMIA 03/24/2010  ? HYPERTENSION 09/17/2006  ? HYPOTHYROIDISM 09/17/2006  ? LIPOMA 12/06/2009  ? PERIPHERAL EDEMA 03/24/2010  ? Renal cyst 07/05/2011  ? 1.9 cm minimally complex  ? ?Past Surgical History:  ?Procedure Laterality Date  ? ABDOMINAL HYSTERECTOMY    ? fibroids  ? ankle surgury    ? x 3 left - chronic pain/swelling  ? COLONOSCOPY  2011  ? SHOULDER SURGERY Left   ? TONSILLECTOMY    ? TUBAL LIGATION    ? ? reports that she has never smoked. She has never used smokeless tobacco. She reports that she does not drink alcohol and does not use drugs. ?family history includes Breast cancer (age of onset: 41) in her sister; Diabetes in an other family member; Heart disease in her sister; Hypertension in her father and mother; Joint hypermobility in an other family member; Prostate cancer in her brother; Stroke in her sister. ?Allergies  ?Allergen Reactions  ? Januvia [Sitagliptin Phosphate]   ?  dizziness  ? Lipitor [Atorvastatin] Other (See Comments)  ?  Myalgia ?  ? Metformin And Related Nausea Only  ? Doxycycline Rash  ? ?Current Outpatient Medications on File Prior to Visit  ?Medication Sig Dispense Refill  ? albuterol (VENTOLIN HFA) 108 (90 Base) MCG/ACT inhaler INHALE 2 PUFFS BY MOUTH EVERY 6 HOURS AS NEEDED FOR WHEEZE 8.5 each  0  ? amLODipine (NORVASC) 10 MG tablet TAKE 1 TABLET BY MOUTH EVERY DAY 90 tablet 1  ? ammonium lactate (LAC-HYDRIN) 12 % lotion APPLY 1 APPLICATION TOPICALLY AS NEEDED FOR DRY SKIN. 400 mL 5  ? aspirin 81 MG EC tablet TAKE 1 TABLET BY MOUTH DAILY. SWALLOW WHOLE. 30 tablet 11  ? Cholecalciferol 50 MCG (2000 UT) TABS 1 tab by mouth once daily 30 tablet 99  ? clotrimazole (LOTRIMIN) 1 % cream Apply to affected feet and between toes twice daily for 6 weeks for athlete's feet 60 g 1  ? clotrimazole-betamethasone (LOTRISONE) cream Apply to both feet and between toes bid x 4 weeks. 45 g 1  ? losartan (COZAAR) 25 MG tablet Take 1 tablet (25 mg total) by mouth daily.  Annual appt due in May must see provider for future refills 30 tablet 0  ? Multiple Vitamins-Minerals (WOMENS MULTIVITAMIN PO) Take 1 tablet by mouth daily.    ? ONETOUCH DELICA LANCETS 33G MISC 1 each by Does not apply route 2 (two) times daily. Use to help check blood sugars twice a day ?Dx E11.9 200 each 11  ? ONETOUCH VERIO test strip USE 2 (TWO) TIMES DAILY. DX E11.9 100 strip 4  ? pioglitazone (ACTOS) 15 MG tablet TAKE 1 TABLET BY MOUTH EVERY DAY 90 tablet 2  ? potassium chloride (KLOR-CON) 10 MEQ tablet TAKE 1 TABLET BY MOUTH EVERY DAY 30 tablet 0  ? vitamin B-12 (CYANOCOBALAMIN) 1000 MCG tablet 1 tab by mouth mon - wed - thur 90 tablet 3  ? ?No current facility-administered medications on file prior to visit.  ? ?     ROS:  All others reviewed and negative. ? ?Objective  ? ?     PE:  BP 120/76 (BP Location: Right Arm, Patient Position: Sitting, Cuff Size: Large)   Pulse 70   Temp 98.2 ?F (36.8 ?C) (Oral)   Ht 4\' 11"  (1.499 m)   Wt 179 lb (81.2 kg)   SpO2 99%   BMI 36.15 kg/m?  ? ?              Constitutional: Pt appears in NAD ?              HENT: Head: NCAT.  ?              Right Ear: External ear normal.   ?              Left Ear: External ear normal.  ?              Eyes: . Pupils are equal, round, and reactive to light. Conjunctivae and EOM are normal ?              Nose: without d/c or deformity ?              Neck: Neck supple. Gross normal ROM ?              Cardiovascular: Normal rate and regular rhythm.   ?              Pulmonary/Chest: Effort normal and breath sounds without rales or wheezing.  ?              Abd:  Soft, NT, ND, + BS, no organomegaly ?              Neurological: Pt is alert. At baseline orientation, motor grossly intact ?  Skin: Skin is warm. No rashes, no other new lesions, LE edema - trace bilat to knees ?              Psychiatric: Pt behavior is normal without agitation  ? ?Micro: none ? ?Cardiac tracings I have personally interpreted today:  none ? ?Pertinent  Radiological findings (summarize): none  ? ?Lab Results  ?Component Value Date  ? WBC 6.6 07/12/2020  ? HGB 14.2 07/12/2020  ? HCT 42.2 07/12/2020  ? PLT 279.0 07/12/2020  ? GLUCOSE 84 01/11/2021  ? CHOL 201 (H) 01/11/2021  ? TRIG 89.0 01/11/2021  ? HDL 56.30 01/11/2021  ? LDLDIRECT 134.6 05/23/2010  ? LDLCALC 127 (H) 01/11/2021  ? ALT 12 01/11/2021  ? AST 19 01/11/2021  ? NA 144 01/11/2021  ? K 4.2 01/11/2021  ? CL 108 01/11/2021  ? CREATININE 0.85 01/11/2021  ? BUN 14 01/11/2021  ? CO2 29 01/11/2021  ? TSH 2.01 07/12/2020  ? HGBA1C 5.8 01/11/2021  ? MICROALBUR <0.7 07/12/2020  ? ?Assessment/Plan:  ?Brandi Shaffer is a 76 y.o. Black or African American [2] female with  has a past medical history of ANEMIA-IRON DEFICIENCY (12/06/2009), Anxiety state (07/01/2015), Asthma (12/05/2011), DIABETES MELLITUS, TYPE II (09/17/2006), DIVERTICULOSIS, COLON (12/06/2009), GENITAL HERPES (03/24/2010), GERD (gastroesophageal reflux disease) (06/28/2011), HYPERLIPIDEMIA (03/24/2010), HYPERTENSION (09/17/2006), HYPOTHYROIDISM (09/17/2006), LIPOMA (12/06/2009), PERIPHERAL EDEMA (03/24/2010), and Renal cyst (07/05/2011). ? ?Vitamin D deficiency ?Last vitamin D ?Lab Results  ?Component Value Date  ? VD25OH 30.11 01/11/2021  ? ?Low, to start oral replacement ? ? ?Encounter for well adult exam with abnormal findings ?Age and sex appropriate education and counseling updated with regular exercise and diet ?Referrals for preventative services - pt has appt for eye exam next month ?Immunizations addressed - none needed ?Smoking counseling  - none needed ?Evidence for depression or other mood disorder - none significant ?Most recent labs reviewed. ?I have personally reviewed and have noted: ?1) the patient's medical and social history ?2) The patient's current medications and supplements ?3) The patient's height, weight, and BMI have been recorded in the chart ? ? ?B12 deficiency ?Lab Results  ?Component Value Date  ? [id number] 1,092 (H) 07/12/2020   ? ?Stable, cont oral replacement - b12 1000 mcg qd ? ? ?Diabetes ?Lab Results  ?Component Value Date  ? HGBA1C 5.8 01/11/2021  ? ?Stable, pt to continue current medical treatment actos ? ? ?Essential hypertension

## 2021-07-11 NOTE — Patient Instructions (Addendum)
Please check your insurance and go to the local pharmacy if it covers the shingrix shingles shot ? ?Ok to change the lipitor to the crestor 40 mg ? ?Please take all new medication as prescribed - the mild fluid pill (HCT 125 mg per day) ? ?Please take OTC Vitamin D3 at 2000 units per day, indefinitely ? ?Please continue all other medications as before, and refills have been done if requested. ? ?Please have the pharmacy call with any other refills you may need. ? ?Please continue your efforts at being more active, low cholesterol diet, and weight control. ? ?You are otherwise up to date with prevention measures today. ? ?Please keep your appointments with your specialists as you may have planned ? ?Please go to the LAB at the blood drawing area for the tests to be done ? ?You will be contacted by phone if any changes need to be made immediately.  Otherwise, you will receive a letter about your results with an explanation, but please check with MyChart first. ? ?Please remember to sign up for MyChart if you have not done so, as this will be important to you in the future with finding out test results, communicating by private email, and scheduling acute appointments online when needed. ? ?Please make an Appointment to return in 6 months, or sooner if neede ? ? ?

## 2021-07-11 NOTE — Assessment & Plan Note (Signed)
Last vitamin D ?Lab Results  ?Component Value Date  ? VD25OH 30.11 01/11/2021  ? ?Low, to start oral replacement ? ?

## 2021-07-11 NOTE — Assessment & Plan Note (Signed)
BP Readings from Last 3 Encounters:  ?07/11/21 120/76  ?01/11/21 (!) 146/88  ?11/29/20 (!) 160/92  ? ?Stable, pt to continue medical treatment norvasc, losartan and add hct 12.5 ? ?

## 2021-07-11 NOTE — Assessment & Plan Note (Signed)
C/w venous insufficiency, for compression stockings, leg elevation, low salt and hct 12.5 qd ?

## 2021-07-11 NOTE — Assessment & Plan Note (Signed)
Lab Results  ?Component Value Date  ? HGBA1C 5.8 01/11/2021  ? ?Stable, pt to continue current medical treatment actos ? ?

## 2021-07-12 ENCOUNTER — Ambulatory Visit: Payer: Medicare Other | Admitting: Internal Medicine

## 2021-07-13 ENCOUNTER — Other Ambulatory Visit: Payer: Self-pay | Admitting: Internal Medicine

## 2021-07-13 NOTE — Telephone Encounter (Signed)
Please refill as per office routine med refill policy (all routine meds to be refilled for 3 mo or monthly (per pt preference) up to one year from last visit, then month to month grace period for 3 mo, then further med refills will have to be denied) ? ?

## 2021-07-19 ENCOUNTER — Telehealth: Payer: Self-pay | Admitting: Internal Medicine

## 2021-07-19 NOTE — Telephone Encounter (Signed)
Pt called in and states she was rx hydrochlorothiazide (MICROZIDE) 12.5 MG capsule and has since felt dehydrated.   Also, she states she is having leg cramps. Wants to know if she should take the medication at a different time or if she can be rx an alternative.   Requesting a call back from an assistant.

## 2021-07-20 NOTE — Telephone Encounter (Signed)
Unfortunately this is the mildest fluid pill we have, and taking at a different time would not help.  All I can suggest is to stop this if the feels the side effects are too great for her.   thanks

## 2021-07-21 NOTE — Telephone Encounter (Signed)
Sent pt msg via mychart w/ MD response...Brandi Shaffer

## 2021-08-08 IMAGING — MG DIGITAL SCREENING BILAT W/ TOMO W/ CAD
6 of 10 series · 6 of 30 positions shown · non-contrast
Comparison: Previous exam(s).

CLINICAL DATA: Screening.

EXAM:
DIGITAL SCREENING BILATERAL MAMMOGRAM WITH TOMO AND CAD

[R CC synth-2D]
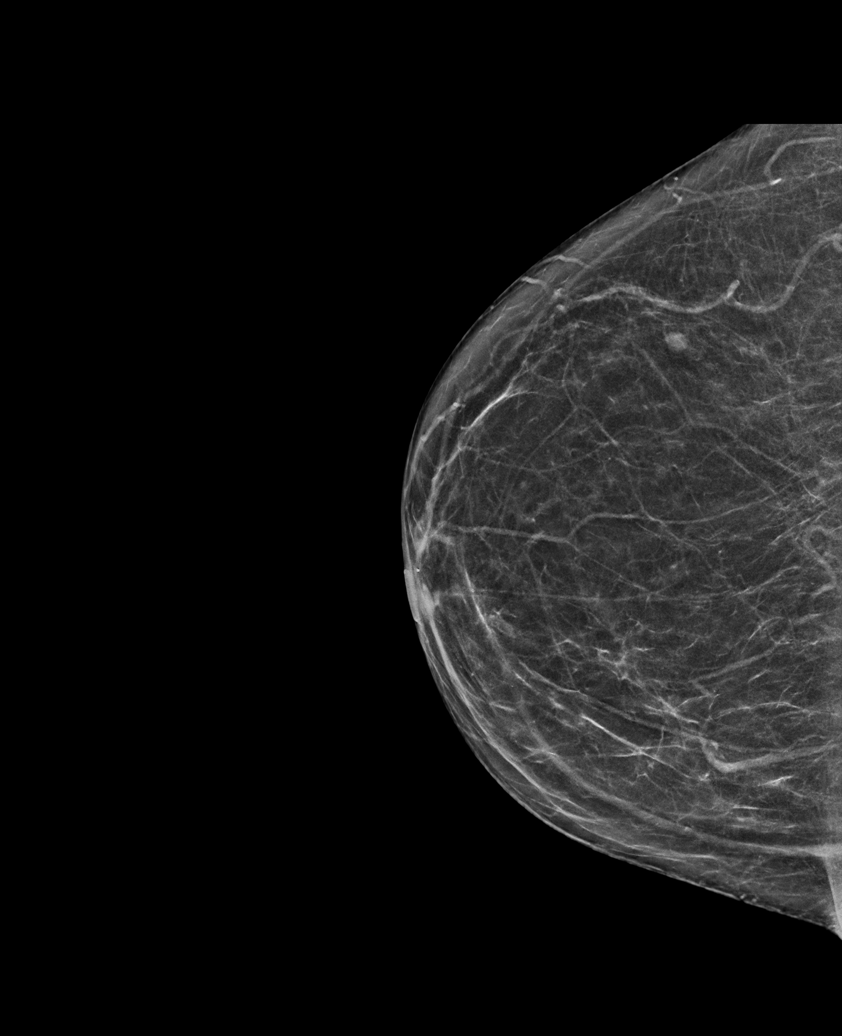

[L MLO synth-2D (1 of 2)]
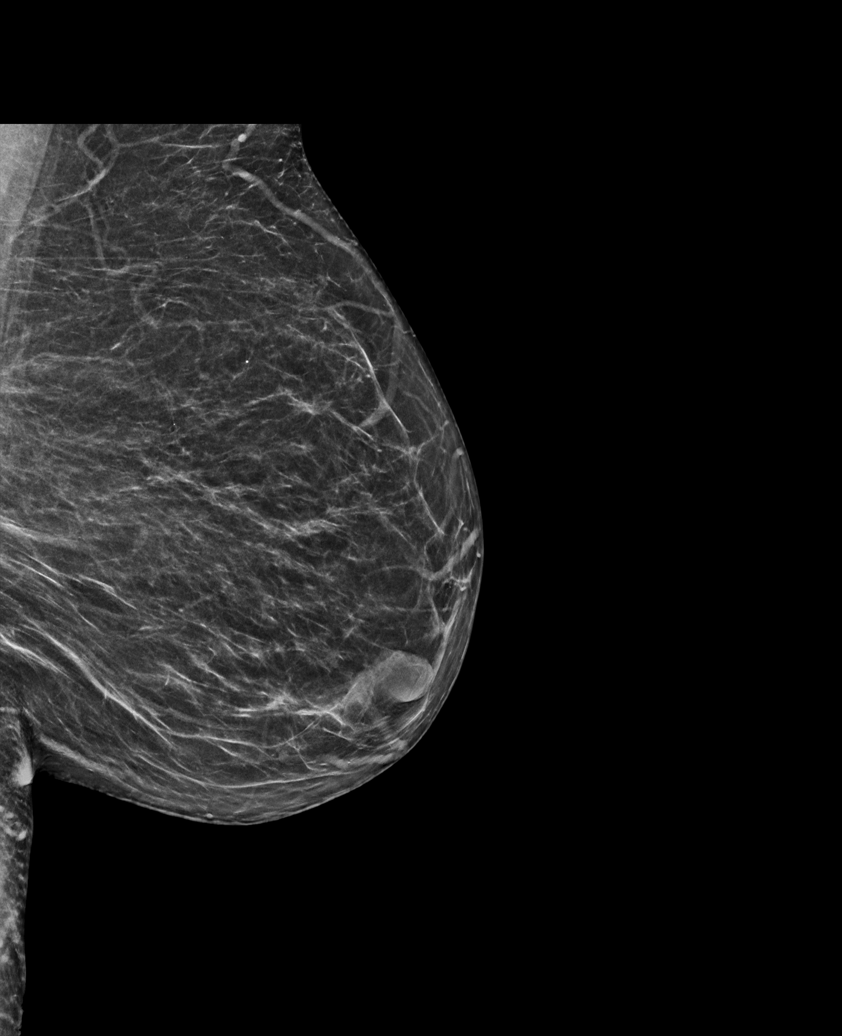

[L MLO synth-2D (2 of 2)]
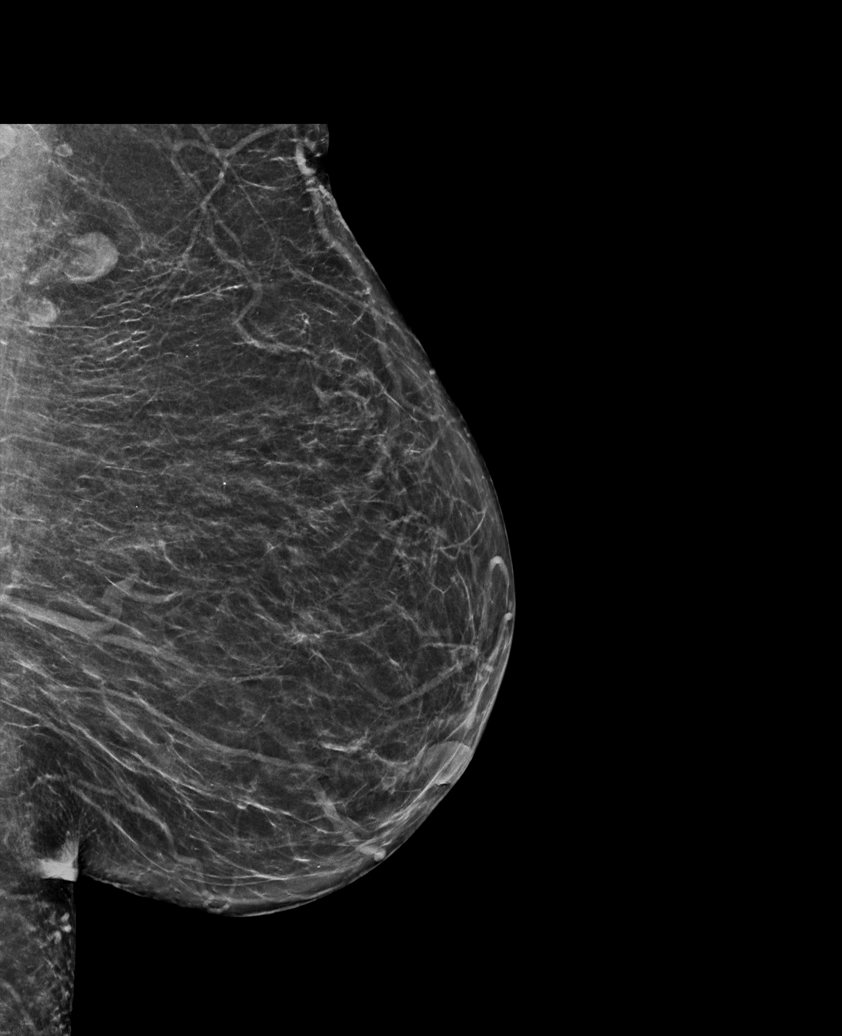

[L CC synth-2D]
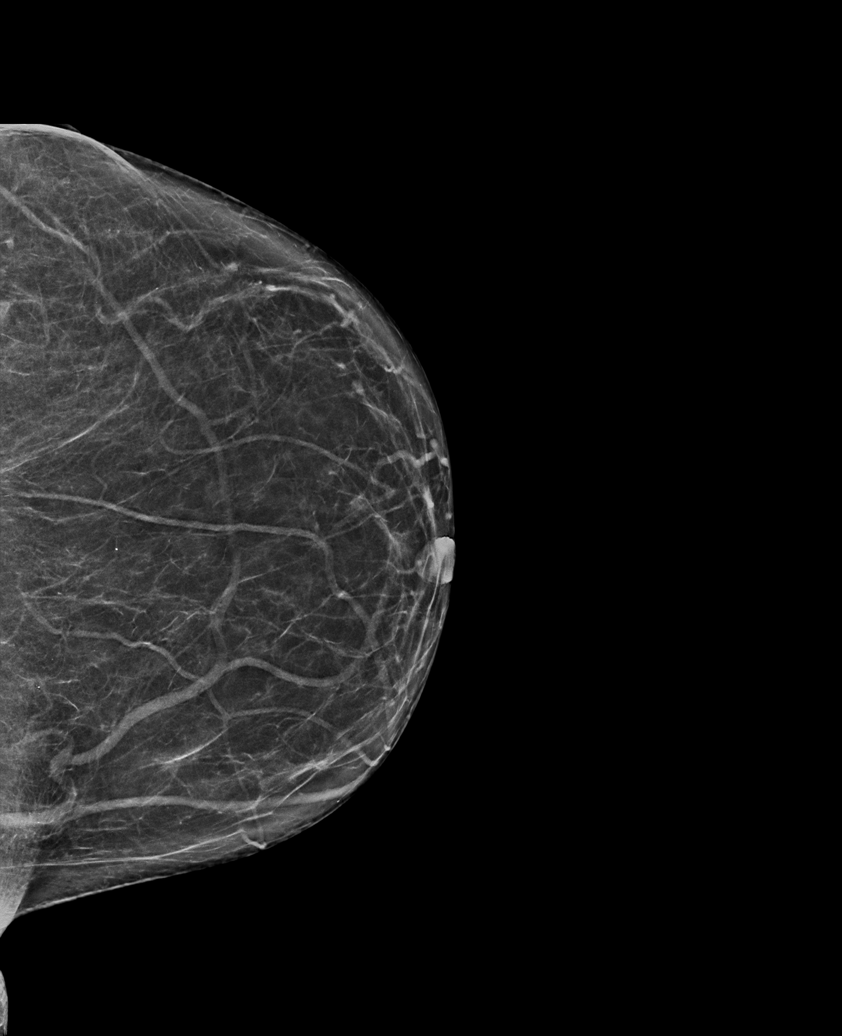

[R MLO synth-2D]
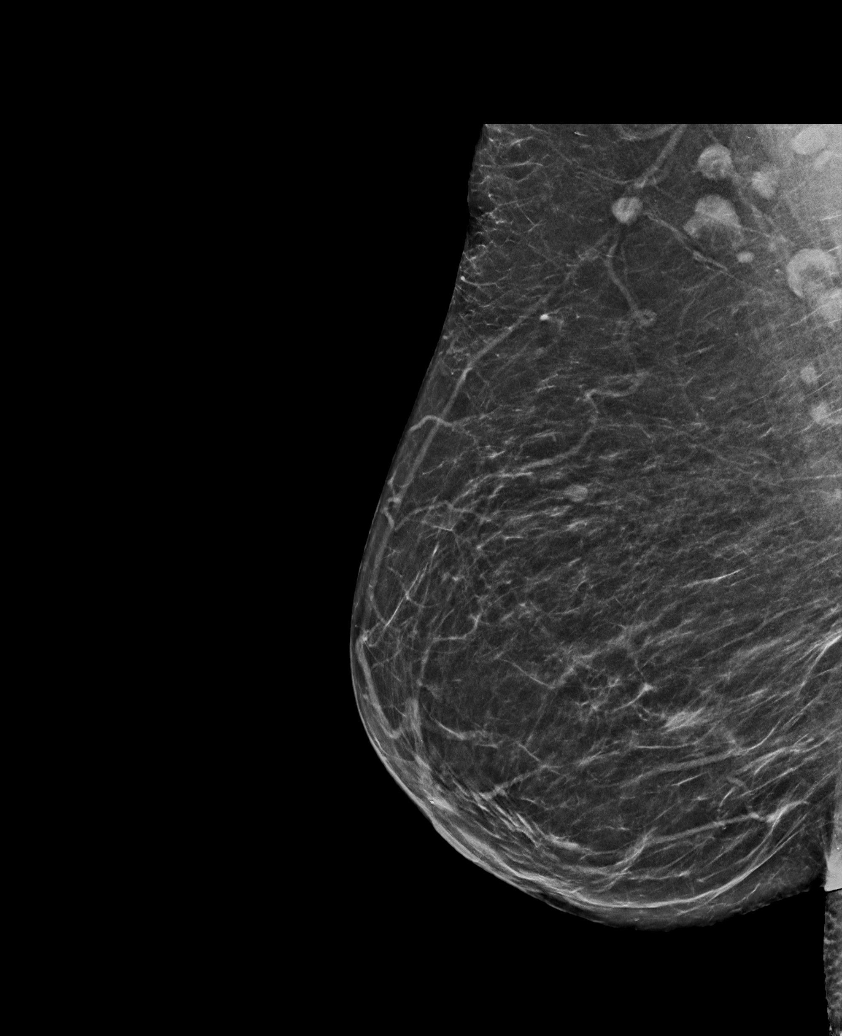

[L MLO tomo · tomo slice 33/64.0]
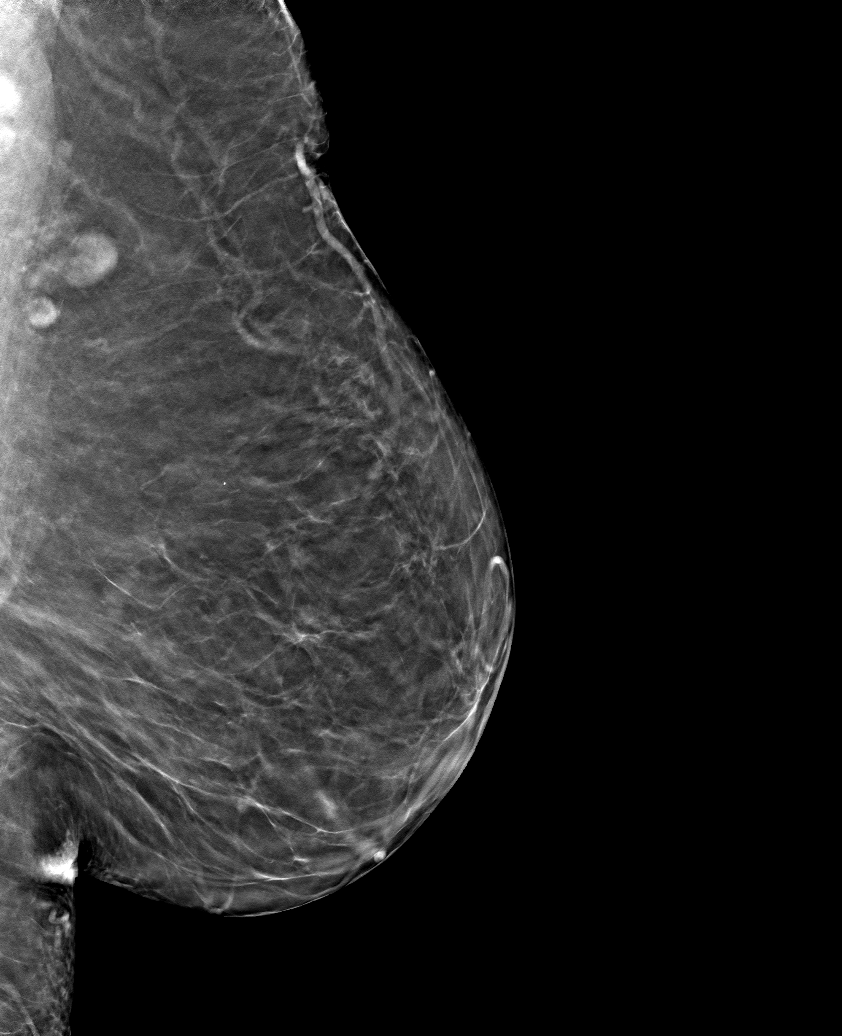

[6 of 30 positions shown; findings below may reference images not displayed]

ACR Breast Density Category b: There are scattered areas of
fibroglandular density.
FINDINGS: There are no findings suspicious for malignancy. Images were
processed with CAD.
IMPRESSION: No mammographic evidence of malignancy. A result letter of this
screening mammogram will be mailed directly to the patient.

RECOMMENDATION:
Screening mammogram in one year. (Code:[id number])

BI-RADS CATEGORY  1: Negative.

## 2021-08-28 ENCOUNTER — Other Ambulatory Visit: Payer: Self-pay | Admitting: Internal Medicine

## 2021-08-28 NOTE — Telephone Encounter (Signed)
Please refill as per office routine med refill policy (all routine meds to be refilled for 3 mo or monthly (per pt preference) up to one year from last visit, then month to month grace period for 3 mo, then further med refills will have to be denied) ? ?

## 2021-09-18 ENCOUNTER — Encounter: Payer: Self-pay | Admitting: Podiatry

## 2021-09-18 ENCOUNTER — Ambulatory Visit (INDEPENDENT_AMBULATORY_CARE_PROVIDER_SITE_OTHER): Payer: Medicare Other | Admitting: Podiatry

## 2021-09-18 DIAGNOSIS — M79674 Pain in right toe(s): Secondary | ICD-10-CM

## 2021-09-18 DIAGNOSIS — B351 Tinea unguium: Secondary | ICD-10-CM | POA: Diagnosis not present

## 2021-09-18 DIAGNOSIS — M19072 Primary osteoarthritis, left ankle and foot: Secondary | ICD-10-CM

## 2021-09-18 DIAGNOSIS — E119 Type 2 diabetes mellitus without complications: Secondary | ICD-10-CM | POA: Diagnosis not present

## 2021-09-18 DIAGNOSIS — M79675 Pain in left toe(s): Secondary | ICD-10-CM | POA: Diagnosis not present

## 2021-09-24 NOTE — Progress Notes (Signed)
ANNUAL DIABETIC FOOT EXAM  Subjective: Brandi Shaffer presents today for annual diabetic foot examination.  Patient confirms h/o diabetes.  Patient denies any h/o foot wounds.  Patient denies any numbness, tingling, burning, or pins/needle sensation in feet.  Last known  HgA1c was 5.1%   Patient did not check blood glucose this morning.  Risk factors: diabetes, HTN, hyperlipidemia.  Biagio Borg, MD is patient's PCP. Last visit was Jul 19, 2021.  Past Medical History:  Diagnosis Date   ANEMIA-IRON DEFICIENCY 12/06/2009   Anxiety state 07/01/2015   Asthma 12/05/2011   DIABETES MELLITUS, TYPE II 09/17/2006   DIVERTICULOSIS, COLON 12/06/2009   GENITAL HERPES 03/24/2010   GERD (gastroesophageal reflux disease) 06/28/2011   HYPERLIPIDEMIA 03/24/2010   HYPERTENSION 09/17/2006   HYPOTHYROIDISM 09/17/2006   LIPOMA 12/06/2009   PERIPHERAL EDEMA 03/24/2010   Renal cyst 07/05/2011   1.9 cm minimally complex   Patient Active Problem List   Diagnosis Date Noted   Leg length discrepancy 09/28/2020   Greater trochanteric bursitis, right 08/09/2020   Posterior vitreous detachment of right eye 02/18/2020   Posterior vitreous detachment of left eye 02/18/2020   Diabetes mellitus without complication ([place]) 98/12/9145   Nuclear sclerotic cataract of both eyes 02/18/2020   Urinary frequency 01/11/2020   Pelvic pain 01/11/2020   Vitamin D deficiency 01/11/2020   B12 deficiency 01/11/2020   Superficial phlebitis 09/03/2019   Pain of right thumb 01/06/2019   Sprain of second toe, left, initial encounter 12/18/2017   Acute upper respiratory infection 01/04/2017   Greater trochanteric bursitis of left hip 12/19/2015   Right otitis media 12/16/2015   Shoulder dislocation 08/03/2015   Anxiety state 07/01/2015   Cellulitis 04/01/2015   Osteoarthritis of left ankle 04/01/2015   Bilateral leg pain 02/15/2015   PVD (peripheral vascular disease) ([place]) 12/22/2014   Cellulitis of right lower leg  12/13/2014   Right leg pain 11/10/2014   Hx of colonic polyps 09/08/2013   Obesity (BMI 30-39.9) 09/08/2013   Impingement syndrome of left shoulder 06/04/2012   Mild aortic stenosis 12/05/2011   Asthma 12/05/2011   Renal cyst 07/05/2011   Low back pain 06/28/2011   GERD (gastroesophageal reflux disease) 06/28/2011   Carpal tunnel syndrome of right wrist 04/22/2011   Right foot pain 05/23/2010   Encounter for well adult exam with abnormal findings 05/23/2010   GENITAL HERPES 03/24/2010   Hyperlipidemia 03/24/2010   PERIPHERAL EDEMA 03/24/2010   Nocturia 03/24/2010   LIPOMA 12/06/2009   ANEMIA-IRON DEFICIENCY 12/06/2009   CONJUNCTIVITIS, BILATERAL 12/06/2009   DIVERTICULOSIS, COLON 12/06/2009   HYPOTHYROIDISM 09/17/2006   Diabetes ([place]) 09/17/2006   Essential hypertension 09/17/2006   Past Surgical History:  Procedure Laterality Date   ABDOMINAL HYSTERECTOMY     fibroids   ankle surgury     x 3 left - chronic pain/swelling   COLONOSCOPY  2011   SHOULDER SURGERY Left    TONSILLECTOMY     TUBAL LIGATION     Current Outpatient Medications on File Prior to Visit  Medication Sig Dispense Refill   albuterol (VENTOLIN HFA) 108 (90 Base) MCG/ACT inhaler INHALE 2 PUFFS BY MOUTH EVERY 6 HOURS AS NEEDED FOR WHEEZING 8.5 each 3   amLODipine (NORVASC) 10 MG tablet TAKE 1 TABLET BY MOUTH EVERY DAY 90 tablet 3   ammonium lactate (LAC-HYDRIN) 12 % lotion APPLY 1 APPLICATION TOPICALLY AS NEEDED FOR DRY SKIN. 400 mL 5   aspirin 81 MG EC tablet TAKE 1 TABLET BY MOUTH DAILY. SWALLOW WHOLE.  30 tablet 11   Cholecalciferol 50 MCG (2000 UT) TABS 1 tab by mouth once daily 30 tablet 99   clotrimazole (LOTRIMIN) 1 % cream Apply to affected feet and between toes twice daily for 6 weeks for athlete's feet 60 g 1   clotrimazole-betamethasone (LOTRISONE) cream Apply to both feet and between toes bid x 4 weeks. 45 g 1   hydrochlorothiazide (MICROZIDE) 12.5 MG capsule Take 1 capsule (12.5 mg total) by  mouth daily. 90 capsule 3   losartan (COZAAR) 25 MG tablet Take 1 tablet (25 mg total) by mouth daily. 90 tablet 3   Multiple Vitamins-Minerals (WOMENS MULTIVITAMIN PO) Take 1 tablet by mouth daily.     ONETOUCH DELICA LANCETS 60V MISC 1 each by Does not apply route 2 (two) times daily. Use to help check blood sugars twice a day Dx E11.9 200 each 11   ONETOUCH VERIO test strip USE 2 (TWO) TIMES DAILY. DX E11.9 100 strip 4   pioglitazone (ACTOS) 15 MG tablet TAKE 1 TABLET BY MOUTH EVERY DAY 90 tablet 2   potassium chloride (KLOR-CON) 10 MEQ tablet TAKE 1 TABLET BY MOUTH EVERY DAY 90 tablet 3   rosuvastatin (CRESTOR) 40 MG tablet Take 1 tablet (40 mg total) by mouth daily. 90 tablet 3   vitamin B-12 (CYANOCOBALAMIN) 1000 MCG tablet 1 tab by mouth mon - wed - thur 90 tablet 3   No current facility-administered medications on file prior to visit.    Allergies  Allergen Reactions   Januvia [Sitagliptin Phosphate]     dizziness   Lipitor [Atorvastatin] Other (See Comments)    Myalgia    Metformin And Related Nausea Only   Doxycycline Rash   Social History   Occupational History   Occupation: CASHIER/ retired    Comment: gas station, stands for work  Tobacco Use   Smoking status: Never   Smokeless tobacco: Never  Vaping Use   Vaping Use: Never used  Substance and Sexual Activity   Alcohol use: No    Alcohol/week: 0.0 standard drinks of alcohol   Drug use: No   Sexual activity: Not Currently   Family History  Problem Relation Age of Onset   Hypertension Mother    Hypertension Father    Heart disease Sister        sudden death early 61's   Stroke Sister    Breast cancer Sister 38   Prostate cancer Brother    Diabetes Other    Joint hypermobility Other        DJD   Colon cancer Neg Hx    Colon polyps Neg Hx    Esophageal cancer Neg Hx    Stomach cancer Neg Hx    Rectal cancer Neg Hx    Immunization History  Administered Date(s) Administered   Fluad Quad(high Dose 65+)  01/06/2019, 01/11/2021   Influenza Split 11/21/2010, 12/05/2011   Influenza Whole 12/06/2009   Influenza, High Dose Seasonal PF 12/04/2012, 12/04/2013, 11/14/2016, 11/15/2017   Influenza,inj,Quad PF,6+ Mos 12/15/2014   PFIZER(Purple Top)SARS-COV-2 Vaccination 05/23/2019, 05/27/2019, 06/13/2019   Pneumococcal Conjugate-13 12/19/2012   Pneumococcal Polysaccharide-23 11/27/2004, 12/06/2009, 06/25/2017   Td 11/27/2004, 06/08/2014     Review of Systems: Negative except as noted in the HPI.   Objective: There were no vitals filed for this visit.  Brandi Shaffer is a pleasant 76 y.o. female in NAD. AAO X 3.  Vascular Examination: Vascular status intact b/l with palpable pedal pulses. Pedal hair present b/l. CFT immediate b/l. No  edema. No pain with calf compression b/l. Skin temperature gradient WNL b/l. No cyanosis or clubbing noted b/l LE.  Neurological Examination: Sensation grossly intact b/l with 10 gram monofilament. Vibratory sensation intact b/l.   Dermatological Examination: Pedal skin with normal turgor, texture and tone b/l. Toenails right great toe and 2-5 b/l thick, discolored, elongated with subungual debris and pain on dorsal palpation. No hyperkeratotic lesions noted b/l.   Musculoskeletal Examination: Muscle strength 5/5 to b/l LE. Limited joint ROM to the left midfoot and left ankle.  Footwear Assessment: Does the patient wear appropriate shoes? Yes. Does the patient need inserts/orthotics? Yes.  Radiographs: None  Last A1c:      Latest Ref Rng & Units 07/11/2021    8:44 AM 01/11/2021    9:15 AM  Hemoglobin A1C  Hemoglobin-A1c 4.6 - 6.5 % 5.6  5.8    Assessment: 1. Pain due to onychomycosis of toenails of both feet   2. Osteoarthritis of left ankle and foot   3. Controlled type 2 diabetes mellitus without complication, without long-term current use of insulin ([place])   4. Encounter for diabetic foot exam ([place])      ADA Risk Categorization: Low Risk :   Patient has all of the following: Intact protective sensation No prior foot ulcer  No severe deformity Pedal pulses present  Plan: -Patient was evaluated and treated. All patient's and/or POA's questions/concerns answered on today's visit. -Diabetic foot examination performed today. -Continue foot and shoe inspections daily. Monitor blood glucose per PCP/Endocrinologist's recommendations. -Toenails 2-5 bilaterally and right great toe debrided in length and girth without iatrogenic bleeding with sterile nail nipper and dremel.  -Patient/POA to call should there be question/concern in the interim. Return in about 3 months (around 12/19/2021).  Marzetta Board, DPM

## 2021-11-04 ENCOUNTER — Other Ambulatory Visit: Payer: Self-pay | Admitting: Internal Medicine

## 2021-11-04 NOTE — Telephone Encounter (Signed)
Please refill as per office routine med refill policy (all routine meds to be refilled for 3 mo or monthly (per pt preference) up to one year from last visit, then month to month grace period for 3 mo, then further med refills will have to be denied) ? ?

## 2021-12-06 DIAGNOSIS — H35033 Hypertensive retinopathy, bilateral: Secondary | ICD-10-CM | POA: Diagnosis not present

## 2021-12-06 DIAGNOSIS — H25813 Combined forms of age-related cataract, bilateral: Secondary | ICD-10-CM | POA: Diagnosis not present

## 2021-12-06 DIAGNOSIS — E119 Type 2 diabetes mellitus without complications: Secondary | ICD-10-CM | POA: Diagnosis not present

## 2021-12-06 DIAGNOSIS — I10 Essential (primary) hypertension: Secondary | ICD-10-CM | POA: Diagnosis not present

## 2021-12-06 DIAGNOSIS — H524 Presbyopia: Secondary | ICD-10-CM | POA: Diagnosis not present

## 2021-12-06 LAB — HM DIABETES EYE EXAM

## 2021-12-12 ENCOUNTER — Other Ambulatory Visit: Payer: Self-pay | Admitting: Internal Medicine

## 2021-12-12 DIAGNOSIS — Z1231 Encounter for screening mammogram for malignant neoplasm of breast: Secondary | ICD-10-CM

## 2021-12-14 ENCOUNTER — Ambulatory Visit (INDEPENDENT_AMBULATORY_CARE_PROVIDER_SITE_OTHER): Payer: Medicare Other | Admitting: Internal Medicine

## 2021-12-14 ENCOUNTER — Encounter: Payer: Self-pay | Admitting: Internal Medicine

## 2021-12-14 VITALS — BP 159/89 | HR 80 | Temp 97.8°F | Resp 12 | Wt 183.8 lb

## 2021-12-14 DIAGNOSIS — H66001 Acute suppurative otitis media without spontaneous rupture of ear drum, right ear: Secondary | ICD-10-CM | POA: Diagnosis not present

## 2021-12-14 MED ORDER — AMOXICILLIN-POT CLAVULANATE 875-125 MG PO TABS: 1.0000 | ORAL_TABLET | Freq: Two times a day (BID) | ORAL | 0 refills | Status: DC

## 2021-12-14 NOTE — Progress Notes (Signed)
  [place]:  [phone]   Routine Medical Office Visit  Patient:  Brandi Shaffer      Age: 76 y.o.       Sex:  female  Date:   12/14/2021  PCP:    Biagio Borg, MD    [facility] Provider: Loralee Pacas, MD  Assessment/Plan:   [name] was seen today for ear infection.  Acute suppurative otitis media of right ear without spontaneous rupture of tympanic membrane, recurrence not specified -     Amoxicillin-Pot Clavulanate; Take 1 tablet by mouth 2 (two) times daily.  Dispense: 20 tablet; Refill: 0  Also recommended simply saline sinus rinses, no q tip ear, follow up if no improvement.    Subjective:   Brandi Shaffer is a 76 y.o. female with PMH significant for: Past Medical History:  Diagnosis Date   ANEMIA-IRON DEFICIENCY 12/06/2009   Anxiety state 07/01/2015   Asthma 12/05/2011   DIABETES MELLITUS, TYPE II 09/17/2006   DIVERTICULOSIS, COLON 12/06/2009   GENITAL HERPES 03/24/2010   GERD (gastroesophageal reflux disease) 06/28/2011   HYPERLIPIDEMIA 03/24/2010   HYPERTENSION 09/17/2006   HYPOTHYROIDISM 09/17/2006   LIPOMA 12/06/2009   PERIPHERAL EDEMA 03/24/2010   Renal cyst 07/05/2011   1.9 cm minimally complex     She is presenting today with: Chief Complaint  Patient presents with   Ear infection    Possibly. Was dizzy earlier today. Feels as if wind is blowing in ear for about a week.     Additional physician collected history: See Assessment/Plan section for per problem updates to history (overview and a/p subsections) as reported by patient today. Denies fever headache Some soreness behind the ear and pain deep down in ear Only symptoms in r ear        Objective:  Physical Exam: BP (!) 159/89 (BP Location: Right Arm, Patient Position: Sitting)   Pulse 80   Temp 97.8 F (36.6 C) (Temporal)   Resp 12   Wt 183 lb 12.8 oz (83.4 kg)   SpO2 100%   BMI 37.12 kg/m   She is a polite, friendly, and genuine person Constitutional:  NAD, AAO, not ill-appearing  Neuro: alert, no focal deficit obvious, articulate speech Psych: normal mood, behavior, thought content   Problem specific physical exam findings:  He has marked bulging of her tympanic membrane and she pulls away and winces in pain when I pull on her ear to look in her ear

## 2021-12-20 ENCOUNTER — Other Ambulatory Visit: Payer: Self-pay

## 2021-12-20 DIAGNOSIS — B3731 Acute candidiasis of vulva and vagina: Secondary | ICD-10-CM

## 2021-12-20 MED ORDER — FLUCONAZOLE 150 MG PO TABS: 150.0000 mg | ORAL_TABLET | Freq: Once | ORAL | 0 refills | Status: AC

## 2021-12-21 ENCOUNTER — Other Ambulatory Visit: Payer: Self-pay | Admitting: Internal Medicine

## 2021-12-21 NOTE — Telephone Encounter (Signed)
Please refill as per office routine med refill policy (all routine meds to be refilled for 3 mo or monthly (per pt preference) up to one year from last visit, then month to month grace period for 3 mo, then further med refills will have to be denied) ? ?

## 2021-12-27 ENCOUNTER — Ambulatory Visit (INDEPENDENT_AMBULATORY_CARE_PROVIDER_SITE_OTHER): Payer: Medicare Other | Admitting: Podiatry

## 2021-12-27 ENCOUNTER — Encounter: Payer: Self-pay | Admitting: Podiatry

## 2021-12-27 DIAGNOSIS — M79674 Pain in right toe(s): Secondary | ICD-10-CM | POA: Diagnosis not present

## 2021-12-27 DIAGNOSIS — L84 Corns and callosities: Secondary | ICD-10-CM | POA: Diagnosis not present

## 2021-12-27 DIAGNOSIS — E119 Type 2 diabetes mellitus without complications: Secondary | ICD-10-CM

## 2021-12-27 DIAGNOSIS — M79675 Pain in left toe(s): Secondary | ICD-10-CM | POA: Diagnosis not present

## 2021-12-27 DIAGNOSIS — B351 Tinea unguium: Secondary | ICD-10-CM

## 2021-12-27 NOTE — Progress Notes (Signed)
Subjective:  Patient ID: Brandi Shaffer, female    DOB: [birth year],  MRN: [id number]  Brandi Shaffer presents to clinic today for at risk foot care with history of diabetic neuropathy and painful thick toenails that are difficult to trim. Pain interferes with ambulation. Aggravating factors include wearing enclosed shoe gear. Pain is relieved with periodic professional debridement. Chief Complaint  Patient presents with   Nail Problem    Diabetic foot care BS-113 A1C-5.7 PCP-[facility] PCP VST-06/2021   New problem(s): None.   PCP is Corwin Levins, MD , and last visit was Jul 11, 2021.  Allergies  Allergen Reactions   Januvia [Sitagliptin Phosphate]     dizziness   Lipitor [Atorvastatin] Other (See Comments)    Myalgia    Metformin And Related Nausea Only   Doxycycline Rash    Review of Systems: Negative except as noted in the HPI.  Objective: No changes noted in today's physical examination.  Brandi Shaffer is a pleasant 76 y.o. female WD, WN in NAD. AAO x 3.  Vascular Examination: Vascular status intact b/l with palpable pedal pulses. Pedal hair present b/l. CFT immediate b/l. No edema. No pain with calf compression b/l. Skin temperature gradient WNL b/l. No cyanosis or clubbing noted b/l LE.  Neurological Examination: Sensation grossly intact b/l with 10 gram monofilament. Vibratory sensation intact b/l.   Dermatological Examination: Pedal skin with normal turgor, texture and tone b/l. Toenails right great toe and 2-5 b/l thick, discolored, elongated with subungual debris and pain on dorsal palpation.   Hyperkeratotic lesion(s) submet head 2 left foot, submet head 3 left foot, submet head 4 left foot, submet head 5 b/l, and plantarlateral aspect of midfoot left foot.  No erythema, no edema, no drainage, no fluctuance.   Musculoskeletal Examination: Muscle strength 5/5 to b/l LE. Limited joint ROM to the left midfoot and left ankle.  Assessment/Plan: 1. Pain due to  onychomycosis of toenails of both feet   2. Controlled type 2 diabetes mellitus without complication, without long-term current use of insulin ([facility])     No orders of the defined types were placed in this encounter.   -Consent given for treatment as described below: -Examined patient. -Continue diabetic foot care principles: inspect feet daily, monitor glucose as recommended by PCP and/or Endocrinologist, and follow prescribed diet per PCP, Endocrinologist and/or dietician. -Mycotic toenails 2-5 bilaterally and R hallux were debrided in length and girth with sterile nail nippers and dremel without iatrogenic bleeding. -Callus(es) submet head 2 left foot, submet head 3 left foot, submet head 4 left foot, and submet head 5 b/l pared utilizing sterile scalpel blade without complication or incident. Total number debrided =5. -Patient/POA to call should there be question/concern in the interim.   Return in about 3 months (around 03/29/2022).  [name], DPM

## 2022-01-11 ENCOUNTER — Ambulatory Visit (INDEPENDENT_AMBULATORY_CARE_PROVIDER_SITE_OTHER): Payer: Medicare Other | Admitting: Internal Medicine

## 2022-01-11 ENCOUNTER — Encounter: Payer: Self-pay | Admitting: Internal Medicine

## 2022-01-11 VITALS — BP 130/82 | HR 84 | Temp 98.0°F | Ht 59.0 in | Wt 179.0 lb

## 2022-01-11 DIAGNOSIS — E559 Vitamin D deficiency, unspecified: Secondary | ICD-10-CM | POA: Diagnosis not present

## 2022-01-11 DIAGNOSIS — E538 Deficiency of other specified B group vitamins: Secondary | ICD-10-CM

## 2022-01-11 DIAGNOSIS — E7800 Pure hypercholesterolemia, unspecified: Secondary | ICD-10-CM

## 2022-01-11 DIAGNOSIS — E1165 Type 2 diabetes mellitus with hyperglycemia: Secondary | ICD-10-CM | POA: Diagnosis not present

## 2022-01-11 DIAGNOSIS — I350 Nonrheumatic aortic (valve) stenosis: Secondary | ICD-10-CM | POA: Diagnosis not present

## 2022-01-11 DIAGNOSIS — Z23 Encounter for immunization: Secondary | ICD-10-CM | POA: Diagnosis not present

## 2022-01-11 DIAGNOSIS — Z0001 Encounter for general adult medical examination with abnormal findings: Secondary | ICD-10-CM

## 2022-01-11 LAB — URINALYSIS, ROUTINE W REFLEX MICROSCOPIC
Bilirubin Urine: NEGATIVE
Hgb urine dipstick: NEGATIVE
Ketones, ur: NEGATIVE
Nitrite: NEGATIVE
RBC / HPF: NONE SEEN (ref 0–?)
Specific Gravity, Urine: 1.015 (ref 1.000–1.030)
Total Protein, Urine: NEGATIVE
Urine Glucose: NEGATIVE
Urobilinogen, UA: 0.2 (ref 0.0–1.0)
pH: 7 (ref 5.0–8.0)

## 2022-01-11 LAB — LIPID PANEL
Cholesterol: 182 mg/dL (ref 0–200)
HDL: 56.6 mg/dL (ref >39.00)
LDL Cholesterol: 111 mg/dL — ABNORMAL HIGH (ref 0–99)
NonHDL: 125.54
Total CHOL/HDL Ratio: 3
Triglycerides: 72 mg/dL (ref 0.0–149.0)
VLDL: 14.4 mg/dL (ref 0.0–40.0)

## 2022-01-11 LAB — BASIC METABOLIC PANEL
BUN: 20 mg/dL (ref 6–23)
CO2: 29 mEq/L (ref 19–32)
Calcium: 9.1 mg/dL (ref 8.4–10.5)
Chloride: 108 mEq/L (ref 96–112)
Creatinine, Ser: 0.88 mg/dL (ref 0.40–1.20)
GFR: 63.55 mL/min (ref >60.00)
Glucose, Bld: 87 mg/dL (ref 70–99)
Potassium: 3.7 mEq/L (ref 3.5–5.1)
Sodium: 143 mEq/L (ref 135–145)

## 2022-01-11 LAB — CBC WITH DIFFERENTIAL/PLATELET
Basophils Absolute: 0 10*3/uL (ref 0.0–0.1)
Basophils Relative: 0.8 % (ref 0.0–3.0)
Eosinophils Absolute: 0.2 10*3/uL (ref 0.0–0.7)
Eosinophils Relative: 3.3 % (ref 0.0–5.0)
HCT: 39.8 % (ref 36.0–46.0)
Hemoglobin: 13.4 g/dL (ref 12.0–15.0)
Lymphocytes Relative: 44 % (ref 12.0–46.0)
Lymphs Abs: 2.3 10*3/uL (ref 0.7–4.0)
MCHC: 33.6 g/dL (ref 30.0–36.0)
MCV: 92.6 fl (ref 78.0–100.0)
Monocytes Absolute: 0.5 10*3/uL (ref 0.1–1.0)
Monocytes Relative: 10.5 % (ref 3.0–12.0)
Neutro Abs: 2.1 10*3/uL (ref 1.4–7.7)
Neutrophils Relative %: 41.4 % — ABNORMAL LOW (ref 43.0–77.0)
Platelets: 236 10*3/uL (ref 150.0–400.0)
RBC: 4.29 Mil/uL (ref 3.87–5.11)
RDW: 14.3 % (ref 11.5–15.5)
WBC: 5.2 10*3/uL (ref 4.0–10.5)

## 2022-01-11 LAB — HEPATIC FUNCTION PANEL
ALT: 14 U/L (ref 0–35)
AST: 20 U/L (ref 0–37)
Albumin: 4.2 g/dL (ref 3.5–5.2)
Alkaline Phosphatase: 97 U/L (ref 39–117)
Bilirubin, Direct: 0.1 mg/dL (ref 0.0–0.3)
Total Bilirubin: 0.6 mg/dL (ref 0.2–1.2)
Total Protein: 6.9 g/dL (ref 6.0–8.3)

## 2022-01-11 LAB — VITAMIN B12: Vitamin B-12: 1421 pg/mL — ABNORMAL HIGH (ref 211–911)

## 2022-01-11 LAB — TSH: TSH: 1.94 u[IU]/mL (ref 0.35–5.50)

## 2022-01-11 LAB — VITAMIN D 25 HYDROXY (VIT D DEFICIENCY, FRACTURES): VITD: 33.64 ng/mL (ref 30.00–100.00)

## 2022-01-11 LAB — MICROALBUMIN / CREATININE URINE RATIO
Creatinine,U: 118.4 mg/dL
Microalb Creat Ratio: 0.9 mg/g (ref 0.0–30.0)
Microalb, Ur: 1.1 mg/dL (ref 0.0–1.9)

## 2022-01-11 LAB — HEMOGLOBIN A1C: Hgb A1c MFr Bld: 6 % (ref 4.6–6.5)

## 2022-01-11 MED ORDER — ROSUVASTATIN CALCIUM 20 MG PO TABS: 20.0000 mg | ORAL_TABLET | Freq: Every day | ORAL | 3 refills | Status: DC

## 2022-01-11 MED ORDER — ONETOUCH DELICA LANCETS 33G MISC: 1.0000 | Freq: Two times a day (BID) | 11 refills | Status: DC

## 2022-01-11 NOTE — Assessment & Plan Note (Signed)
Age and sex appropriate education and counseling updated with regular exercise and diet Referrals for preventative services - none needed Immunizations addressed - for flu shot today, plans for shingrix at pharmacy Smoking counseling  - none needed Evidence for depression or other mood disorder - none significant Most recent labs reviewed. I have personally reviewed and have noted: 1) the patient's medical and social history 2) The patient's current medications and supplements 3) The patient's height, weight, and BMI have been recorded in the chart

## 2022-01-11 NOTE — Assessment & Plan Note (Signed)
Lab Results  Component Value Date   VITAMINB12 4,503 (H) 01/11/2022   Stable, cont oral replacement - b12 1000 mcg qd

## 2022-01-11 NOTE — Assessment & Plan Note (Signed)
Lab Results  Component Value Date   LDLCALC 111 (H) 01/11/2022   Uncontrolled, ok for crestor 20 mg qd

## 2022-01-11 NOTE — Assessment & Plan Note (Signed)
Last vitamin D Lab Results  Component Value Date   VD25OH 23.26 (L) 07/11/2021   Low, to start oral replacement

## 2022-01-11 NOTE — Assessment & Plan Note (Signed)
?   Worsening murmur, for f/u Echo (last was 2018 - mild AS)

## 2022-01-11 NOTE — Patient Instructions (Signed)
You had the flu shot today  Please have your Shingrix (shingles) shots done at your local pharmacy.  Ok to decrease the crestor to 20 mg and take every day  You will be contacted regarding the referral for: Echocardiogram for the heart  Please continue all other medications as before, and refills have been done if requested.  Please have the pharmacy call with any other refills you may need.  Please continue your efforts at being more active, low cholesterol diet, and weight control.  You are otherwise up to date with prevention measures today.  Please keep your appointments with your specialists as you may have planned  Please go to the LAB at the blood drawing area for the tests to be done  You will be contacted by phone if any changes need to be made immediately.  Otherwise, you will receive a letter about your results with an explanation, but please check with MyChart first.  Please remember to sign up for MyChart if you have not done so, as this will be important to you in the future with finding out test results, communicating by private email, and scheduling acute appointments online when needed.  Please make an Appointment to return in 6 months, or sooner if needed

## 2022-01-11 NOTE — Progress Notes (Signed)
Patient ID: Brandi Shaffer, female   DOB: Jan 03, 1946, 76 y.o.   MRN: [id number]         Chief Complaint:: wellness exam and HLD, aortic stenosis, low vit d and b12, DM       HPI:  Brandi Shaffer is a 76 y.o. female here for wellness exam;plans to have shingrix at pharmacy, for flu shot, o/w up to date                         Also only takes crestor 40 mg rarely as the dose scares her and wary of side effects. Is willing to take low dose daily.  Pt denies chest pain, increased sob or doe, wheezing, orthopnea, PND, increased LE swelling, palpitations, dizziness or syncope.   Pt denies polydipsia, polyuria, or new focal neuro s/s.    Pt denies fever, wt loss, night sweats, loss of appetite, or other constitutional symptoms     Wt Readings from Last 3 Encounters:  01/11/22 179 lb (81.2 kg)  12/14/21 183 lb 12.8 oz (83.4 kg)  07/11/21 179 lb (81.2 kg)   BP Readings from Last 3 Encounters:  01/11/22 130/82  12/14/21 (!) 159/89  07/11/21 120/76   Immunization History  Administered Date(s) Administered   Fluad Quad(high Dose 65+) 01/06/2019, 01/11/2021, 01/11/2022   Influenza Split 11/21/2010, 12/05/2011   Influenza Whole 12/06/2009   Influenza, High Dose Seasonal PF 12/04/2012, 12/04/2013, 11/14/2016, 11/15/2017   Influenza,inj,Quad PF,6+ Mos 12/15/2014   PFIZER(Purple Top)SARS-COV-2 Vaccination 05/23/2019, 05/27/2019, 06/13/2019   Pneumococcal Conjugate-13 12/19/2012   Pneumococcal Polysaccharide-23 11/27/2004, 12/06/2009, 06/25/2017   Td 11/27/2004, 06/08/2014   Health Maintenance Due  Topic Date Due   Medicare Annual Wellness (AWV)  03/03/2022      Past Medical History:  Diagnosis Date   ANEMIA-IRON DEFICIENCY 12/06/2009   Anxiety state 07/01/2015   Asthma 12/05/2011   DIABETES MELLITUS, TYPE II 09/17/2006   DIVERTICULOSIS, COLON 12/06/2009   GENITAL HERPES 03/24/2010   GERD (gastroesophageal reflux disease) 06/28/2011   HYPERLIPIDEMIA 03/24/2010   HYPERTENSION 09/17/2006    HYPOTHYROIDISM 09/17/2006   LIPOMA 12/06/2009   PERIPHERAL EDEMA 03/24/2010   Renal cyst 07/05/2011   1.9 cm minimally complex   Past Surgical History:  Procedure Laterality Date   ABDOMINAL HYSTERECTOMY     fibroids   ankle surgury     x 3 left - chronic pain/swelling   COLONOSCOPY  2011   SHOULDER SURGERY Left    TONSILLECTOMY     TUBAL LIGATION      reports that she has never smoked. She has never used smokeless tobacco. She reports that she does not drink alcohol and does not use drugs. family history includes Breast cancer (age of onset: 7) in her sister; Diabetes in an other family member; Heart disease in her sister; Hypertension in her father and mother; Joint hypermobility in an other family member; Prostate cancer in her brother; Stroke in her sister. Allergies  Allergen Reactions   Januvia [Sitagliptin Phosphate]     dizziness   Lipitor [Atorvastatin] Other (See Comments)    Myalgia    Metformin And Related Nausea Only   Doxycycline Rash   Current Outpatient Medications on File Prior to Visit  Medication Sig Dispense Refill   albuterol (VENTOLIN HFA) 108 (90 Base) MCG/ACT inhaler TAKE 2 PUFFS BY MOUTH EVERY 6 HOURS AS NEEDED FOR WHEEZE 8.5 each 3   amLODipine (NORVASC) 10 MG tablet TAKE 1 TABLET BY MOUTH EVERY DAY  90 tablet 3   ammonium lactate (LAC-HYDRIN) 12 % lotion APPLY 1 APPLICATION TOPICALLY AS NEEDED FOR DRY SKIN. 400 mL 5   aspirin 81 MG EC tablet TAKE 1 TABLET BY MOUTH DAILY. SWALLOW WHOLE. 30 tablet 11   Cholecalciferol 50 MCG (2000 UT) TABS 1 tab by mouth once daily 30 tablet 99   clotrimazole (LOTRIMIN) 1 % cream Apply to affected feet and between toes twice daily for 6 weeks for athlete's feet 60 g 1   hydrochlorothiazide (MICROZIDE) 12.5 MG capsule Take 1 capsule (12.5 mg total) by mouth daily. 90 capsule 3   losartan (COZAAR) 25 MG tablet Take 1 tablet (25 mg total) by mouth daily. 90 tablet 3   Multiple Vitamins-Minerals (WOMENS MULTIVITAMIN PO) Take 1  tablet by mouth daily.     ONETOUCH VERIO test strip USE 2 (TWO) TIMES DAILY. DX E11.9 100 strip 4   pioglitazone (ACTOS) 15 MG tablet TAKE 1 TABLET BY MOUTH EVERY DAY 90 tablet 2   potassium chloride (KLOR-CON) 10 MEQ tablet TAKE 1 TABLET BY MOUTH EVERY DAY 90 tablet 3   vitamin B-12 (CYANOCOBALAMIN) 1000 MCG tablet 1 tab by mouth mon - wed - thur 90 tablet 3   clotrimazole-betamethasone (LOTRISONE) cream Apply to both feet and between toes bid x 4 weeks. (Patient not taking: Reported on 12/14/2021) 45 g 1   No current facility-administered medications on file prior to visit.        ROS:  All others reviewed and negative.  Objective        PE:  BP 130/82 (BP Location: Right Arm, Patient Position: Sitting, Cuff Size: Large)   Pulse 84   Temp 98 F (36.7 C) (Oral)   Ht 4\' 11"  (1.499 m)   Wt 179 lb (81.2 kg)   SpO2 98%   BMI 36.15 kg/m                 Constitutional: Pt appears in NAD               HENT: Head: NCAT.                Right Ear: External ear normal.                 Left Ear: External ear normal.                Eyes: . Pupils are equal, round, and reactive to light. Conjunctivae and EOM are normal               Nose: without d/c or deformity               Neck: Neck supple. Gross normal ROM               Cardiovascular: Normal rate and regular rhythm. Gr 2-3/6 syst murmur                Pulmonary/Chest: Effort normal and breath sounds without rales or wheezing.                Abd:  Soft, NT, ND, + BS, no organomegaly               Neurological: Pt is alert. At baseline orientation, motor grossly intact               Skin: Skin is warm. No rashes, no other new lesions, LE edema - none  Psychiatric: Pt behavior is normal without agitation   Micro: none  Cardiac tracings I have personally interpreted today:  none  Pertinent Radiological findings (summarize): none   Lab Results  Component Value Date   WBC 5.2 01/11/2022   HGB 13.4 01/11/2022   HCT  39.8 01/11/2022   PLT 236.0 01/11/2022   GLUCOSE 87 01/11/2022   CHOL 182 01/11/2022   TRIG 72.0 01/11/2022   HDL 56.60 01/11/2022   LDLDIRECT 134.6 05/23/2010   LDLCALC 111 (H) 01/11/2022   ALT 14 01/11/2022   AST 20 01/11/2022   NA 143 01/11/2022   K 3.7 01/11/2022   CL 108 01/11/2022   CREATININE 0.88 01/11/2022   BUN 20 01/11/2022   CO2 29 01/11/2022   TSH 1.94 01/11/2022   HGBA1C 6.0 01/11/2022   MICROALBUR 1.1 01/11/2022   Assessment/Plan:  Brandi Shaffer is a 76 y.o. Black or African American [2] female with  has a past medical history of ANEMIA-IRON DEFICIENCY (12/06/2009), Anxiety state (07/01/2015), Asthma (12/05/2011), DIABETES MELLITUS, TYPE II (09/17/2006), DIVERTICULOSIS, COLON (12/06/2009), GENITAL HERPES (03/24/2010), GERD (gastroesophageal reflux disease) (06/28/2011), HYPERLIPIDEMIA (03/24/2010), HYPERTENSION (09/17/2006), HYPOTHYROIDISM (09/17/2006), LIPOMA (12/06/2009), PERIPHERAL EDEMA (03/24/2010), and Renal cyst (07/05/2011).  Vitamin D deficiency Last vitamin D Lab Results  Component Value Date   VD25OH 23.26 (L) 07/11/2021   Low, to start oral replacement   Mild aortic stenosis ? Worsening murmur, for f/u Echo (last was 2018 - mild AS)  Encounter for well adult exam with abnormal findings Age and sex appropriate education and counseling updated with regular exercise and diet Referrals for preventative services - none needed Immunizations addressed - for flu shot today, plans for shingrix at pharmacy Smoking counseling  - none needed Evidence for depression or other mood disorder - none significant Most recent labs reviewed. I have personally reviewed and have noted: 1) the patient's medical and social history 2) The patient's current medications and supplements 3) The patient's height, weight, and BMI have been recorded in the chart   Hyperlipidemia Lab Results  Component Value Date   LDLCALC 111 (H) 01/11/2022   Uncontrolled, ok for crestor 20 mg  qd   Diabetes Lab Results  Component Value Date   HGBA1C 6.0 01/11/2022   Stable, pt to continue current medical treatment actos 15 mg qd   B12 deficiency Lab Results  Component Value Date   VITAMINB12 1,421 (H) 01/11/2022   Stable, cont oral replacement - b12 1000 mcg qd  Followup: Return in about 6 months (around 07/12/2022).  Oliver Barre, MD 01/11/2022 1:21 PM [facility]

## 2022-01-11 NOTE — Assessment & Plan Note (Signed)
Lab Results  Component Value Date   HGBA1C 6.0 01/11/2022   Stable, pt to continue current medical treatment actos 15 mg qd

## 2022-01-25 ENCOUNTER — Ambulatory Visit
Admission: RE | Admit: 2022-01-25 | Discharge: 2022-01-25 | Disposition: A | Discharge status: Home / Self Care | Payer: Medicare Other | Source: Ambulatory Visit | Attending: Internal Medicine | Admitting: Internal Medicine

## 2022-01-25 DIAGNOSIS — Z1231 Encounter for screening mammogram for malignant neoplasm of breast: Secondary | ICD-10-CM

## 2022-02-05 ENCOUNTER — Other Ambulatory Visit: Payer: Self-pay | Admitting: Internal Medicine

## 2022-03-05 ENCOUNTER — Ambulatory Visit (INDEPENDENT_AMBULATORY_CARE_PROVIDER_SITE_OTHER): Payer: Medicare Other

## 2022-03-05 VITALS — Ht 59.0 in | Wt 178.0 lb

## 2022-03-05 DIAGNOSIS — Z0000 Encounter for general adult medical examination without abnormal findings: Secondary | ICD-10-CM | POA: Diagnosis not present

## 2022-03-05 NOTE — Progress Notes (Signed)
Virtual Visit via Telephone Note  I connected with  Brandi Shaffer on 03/05/22 at  2:30 PM EST by telephone and verified that I am speaking with the correct person using two identifiers.  Location: Patient: Home Provider: [place]  Persons participating in the virtual visit: [place]   I discussed the limitations, risks, security and privacy concerns of performing an evaluation and management service by telephone and the availability of in person appointments. The patient expressed understanding and agreed to proceed.  Interactive audio and video telecommunications were attempted between this nurse and patient, however failed, due to patient having technical difficulties OR patient did not have access to video capability.  We continued and completed visit with audio only.  Some vital signs may be absent or patient reported.   Sheral Flow, LPN  Subjective:   Brandi Shaffer is a 77 y.o. female who presents for Medicare Annual (Subsequent) preventive examination.  Review of Systems     Cardiac Risk Factors include: advanced age (>74mn, >>28women);diabetes mellitus;dyslipidemia;family history of premature cardiovascular disease;hypertension;obesity (BMI >30kg/m2)     Objective:    Today's Vitals   03/05/22 1436  Weight: 178 lb (80.7 kg)  Height: '4\' 11"'$  (1.499 m)  PainSc: 0-No pain   Body mass index is 35.95 kg/m.     03/05/2022    2:39 PM 03/03/2021   10:05 AM 01/12/2020    1:02 PM 01/06/2019   10:09 AM 11/15/2017    8:43 AM 11/14/2016   11:36 AM 07/31/2015    6:41 AM  Advanced Directives  Does Patient Have a Medical Advance Directive? No No No No No No No  Does patient want to make changes to medical advance directive?      Yes (ED - Information included in AVS)   Would patient like information on creating a medical advance directive? No - Patient declined No - Patient declined No - Patient declined No - Patient declined Yes (ED -  Information included in AVS)  No - patient declined information    Current Medications (verified) Outpatient Encounter Medications as of 03/05/2022  Medication Sig   albuterol (VENTOLIN HFA) 108 (90 Base) MCG/ACT inhaler TAKE 2 PUFFS BY MOUTH EVERY 6 HOURS AS NEEDED FOR WHEEZE   amLODipine (NORVASC) 10 MG tablet TAKE 1 TABLET BY MOUTH EVERY DAY   ammonium lactate (LAC-HYDRIN) 12 % lotion APPLY 1 APPLICATION TOPICALLY AS NEEDED FOR DRY SKIN.   aspirin 81 MG EC tablet TAKE 1 TABLET BY MOUTH DAILY. SWALLOW WHOLE.   Cholecalciferol 50 MCG (2000 UT) TABS 1 tab by mouth once daily   clotrimazole (LOTRIMIN) 1 % cream Apply to affected feet and between toes twice daily for 6 weeks for athlete's feet   clotrimazole-betamethasone (LOTRISONE) cream Apply to both feet and between toes bid x 4 weeks. (Patient not taking: Reported on 12/14/2021)   hydrochlorothiazide (MICROZIDE) 12.5 MG capsule Take 1 capsule (12.5 mg total) by mouth daily.   losartan (COZAAR) 25 MG tablet Take 1 tablet (25 mg total) by mouth daily.   Multiple Vitamins-Minerals (WOMENS MULTIVITAMIN PO) Take 1 tablet by mouth daily.   OneTouch Delica Lancets 358NMISC 1 each by Does not apply route 2 (two) times daily. Use to check blood sugars twice a day Dx E11.9   ONETOUCH VERIO test strip USE 2 (TWO) TIMES DAILY. DX E11.9   pioglitazone (ACTOS) 15 MG tablet TAKE 1 TABLET BY MOUTH EVERY DAY   potassium chloride (KLOR-CON) 10 MEQ tablet  TAKE 1 TABLET BY MOUTH EVERY DAY   rosuvastatin (CRESTOR) 20 MG tablet Take 1 tablet (20 mg total) by mouth daily.   vitamin B-12 (CYANOCOBALAMIN) 1000 MCG tablet 1 tab by mouth mon - wed - thur   No facility-administered encounter medications on file as of 03/05/2022.    Allergies (verified) Januvia [sitagliptin phosphate], Lipitor [atorvastatin], Metformin and related, and Doxycycline   History: Past Medical History:  Diagnosis Date   ANEMIA-IRON DEFICIENCY 12/06/2009   Anxiety state 07/01/2015    Asthma 12/05/2011   DIABETES MELLITUS, TYPE II 09/17/2006   DIVERTICULOSIS, COLON 12/06/2009   GENITAL HERPES 03/24/2010   GERD (gastroesophageal reflux disease) 06/28/2011   HYPERLIPIDEMIA 03/24/2010   HYPERTENSION 09/17/2006   HYPOTHYROIDISM 09/17/2006   LIPOMA 12/06/2009   PERIPHERAL EDEMA 03/24/2010   Renal cyst 07/05/2011   1.9 cm minimally complex   Past Surgical History:  Procedure Laterality Date   ABDOMINAL HYSTERECTOMY     fibroids   ankle surgury     x 3 left - chronic pain/swelling   COLONOSCOPY  2011   SHOULDER SURGERY Left    TONSILLECTOMY     TUBAL LIGATION     Family History  Problem Relation Age of Onset   Hypertension Mother    Hypertension Father    Heart disease Sister        sudden death early 57's   Stroke Sister    Breast cancer Sister 20   Prostate cancer Brother    Diabetes Other    Joint hypermobility Other        DJD   Colon cancer Neg Hx    Colon polyps Neg Hx    Esophageal cancer Neg Hx    Stomach cancer Neg Hx    Rectal cancer Neg Hx    Social History   Socioeconomic History   Marital status: Divorced    Spouse name: Not on file   Number of children: 7   Years of education: Not on file   Highest education level: Not on file  Occupational History   Occupation: CASHIER/ retired    Comment: gas station, stands for work  Tobacco Use   Smoking status: Never   Smokeless tobacco: Never  Vaping Use   Vaping Use: Never used  Substance and Sexual Activity   Alcohol use: No    Alcohol/week: 0.0 standard drinks of alcohol   Drug use: No   Sexual activity: Not Currently  Other Topics Concern   Not on file  Social History Narrative   Lives with grandson and and pets;    Have 6 sons and one dtr   Have grand- children; running children through out the day and cooking   Social Determinants of Health   Financial Resource Strain: Low Risk  (03/05/2022)   Overall Financial Resource Strain (CARDIA)    Difficulty of Paying Living Expenses: Not hard  at all  Food Insecurity: No Food Insecurity (03/05/2022)   Hunger Vital Sign    Worried About Running Out of Food in the Last Year: Never true    Ran Out of Food in the Last Year: Never true  Transportation Needs: No Transportation Needs (03/05/2022)   PRAPARE - Hydrologist (Medical): No    Lack of Transportation (Non-Medical): No  Physical Activity: Sufficiently Active (03/05/2022)   Exercise Vital Sign    Days of Exercise per Week: 5 days    Minutes of Exercise per Session: 30 min  Stress: No Stress Concern  Present (03/05/2022)   [place]    Feeling of Stress : Not at all  Social Connections: Moderately Integrated (03/05/2022)   Social Connection and Isolation Panel [NHANES]    Frequency of Communication with Friends and Family: More than three times a week    Frequency of Social Gatherings with Friends and Family: More than three times a week    Attends Religious Services: More than 4 times per year    Active Member of [employer] or Organizations: No    Attends Music therapist: More than 4 times per year    Marital Status: Divorced    Tobacco Counseling Counseling given: Not Answered   Clinical Intake:  Pre-visit preparation completed: Yes  Pain : No/denies pain Pain Score: 0-No pain     BMI - recorded: 35.95 Nutritional Status: BMI > 30  Obese Nutritional Risks: None Diabetes: Yes CBG done?: No Did pt. bring in CBG monitor from home?: No  How often do you need to have someone help you when you read instructions, pamphlets, or other written materials from your doctor or pharmacy?: 1 - Never What is the last grade level you completed in school?: HSG; 1 year of college  Nutrition Risk Assessment:  Has the patient had any N/V/D within the last 2 months?  No  Does the patient have any non-healing wounds?  No  Has the patient had any unintentional weight loss or weight  gain?  No   Diabetes:  Is the patient diabetic?  Yes  If diabetic, was a CBG obtained today?  No  Did the patient bring in their glucometer from home?  No  How often do you monitor your CBG's? Twice daily.   Financial Strains and Diabetes Management:  Are you having any financial strains with the device, your supplies or your medication? No .  Does the patient want to be seen by Chronic Care Management for management of their diabetes?  No  Would the patient like to be referred to a Nutritionist or for Diabetic Management?  No   Diabetic Exams:  Diabetic Eye Exam: Completed 12/06/2021 Diabetic Foot Exam: Completed 01/11/2022   Interpreter Needed?: No  Information entered by :: Lisette Abu, LPN.   Activities of Daily Living    03/05/2022    2:40 PM  In your present state of health, do you have any difficulty performing the following activities:  Hearing? 0  Vision? 0  Difficulty concentrating or making decisions? 0  Walking or climbing stairs? 0  Dressing or bathing? 0  Doing errands, shopping? 0  Preparing Food and eating ? N  Using the Toilet? N  In the past six months, have you accidently leaked urine? N  Do you have problems with loss of bowel control? N  Managing your Medications? N  Managing your Finances? N  Housekeeping or managing your Housekeeping? N    Patient Care Team: Biagio Borg, MD as PCP - General Lyndal Pulley, DO as Consulting Physician (Family Medicine) Marzetta Board, DPM as Consulting Physician (Podiatry) Zadie Rhine Clent Demark, MD as Consulting Physician (Ophthalmology) Webb Laws, [place] as Referring Physician (Optometry)  Indicate any recent Medical Services you may have received from other than [facility] providers in the past year (date may be approximate).     Assessment:   This is a routine wellness examination for [place].  Hearing/Vision screen Hearing Screening - Comments:: Denies hearing difficulties.   Vision Screening -  Comments::  Wears rx glasses - up to date with routine eye exams with Deloria Lair, MD.   Dietary issues and exercise activities discussed: Current Exercise Habits: Home exercise routine, Type of exercise: treadmill;Other - see comments (stationary bike), Time (Minutes): 30, Frequency (Times/Week): 5, Weekly Exercise (Minutes/Week): 150, Intensity: Moderate   Goals Addressed             This Visit's Progress    My goal for 2024 is to lose some weight. I want to do me this year.        Depression Screen    03/05/2022    2:39 PM 01/11/2022    8:15 AM 01/11/2022    8:02 AM 07/11/2021    8:12 AM 07/11/2021    7:59 AM 03/03/2021   10:11 AM 07/12/2020   10:23 AM  PHQ 2/9 Scores  PHQ - 2 Score 0 0 0 0 0 0 0  PHQ- 9 Score 0  0  1      Fall Risk    03/05/2022    2:39 PM 01/11/2022    8:15 AM 01/11/2022    8:02 AM 07/11/2021    8:11 AM 07/11/2021    7:59 AM  Fall Risk   Falls in the past year? 0 0 0 0 0  Number falls in past yr: 0 0  0 0  Injury with Fall? 0 0  0 0  Risk for fall due to : No Fall Risks      Follow up Falls prevention discussed        FALL RISK PREVENTION PERTAINING TO THE HOME:  Any stairs in or around the home? Yes  If so, are there any without handrails? No  Home free of loose throw rugs in walkways, pet beds, electrical cords, etc? Yes  Adequate lighting in your home to reduce risk of falls? Yes   ASSISTIVE DEVICES UTILIZED TO PREVENT FALLS:  Life alert? Yes  Use of a cane, walker or w/c? No  Grab bars in the bathroom? No  Shower chair or bench in shower? No  Elevated toilet seat or a handicapped toilet? No   TIMED UP AND GO:  Was the test performed? No . Phone Visit  Cognitive Function:    11/14/2016   11:45 AM 06/29/2014    8:58 AM  MMSE - Mini Mental State Exam  Not completed:  Unable to complete  Orientation to time 5   Orientation to Place 5   Registration 3   Attention/ Calculation 4   Recall 2   Language- name 2 objects 2   Language-  repeat 1   Language- follow 3 step command 3   Language- read & follow direction 1   Write a sentence 1   Copy design 1   Total score 28         03/05/2022    2:40 PM 01/12/2020    1:04 PM  6CIT Screen  What Year? 0 points 0 points  What month? 0 points 0 points  What time? 0 points 0 points  Count back from 20 0 points 0 points  Months in reverse 0 points 0 points  Repeat phrase 0 points 0 points  Total Score 0 points 0 points    Immunizations Immunization History  Administered Date(s) Administered   Fluad Quad(high Dose 65+) 01/06/2019, 01/11/2021, 01/11/2022   Influenza Split 11/21/2010, 12/05/2011   Influenza Whole 12/06/2009   Influenza, High Dose Seasonal PF 12/04/2012, 12/04/2013, 11/14/2016, 11/15/2017   Influenza,inj,Quad PF,6+ Mos  12/15/2014   PFIZER(Purple Top)SARS-COV-2 Vaccination 05/23/2019, 05/27/2019, 06/13/2019   Pneumococcal Conjugate-13 12/19/2012   Pneumococcal Polysaccharide-23 11/27/2004, 12/06/2009, 06/25/2017   Td 11/27/2004, 06/08/2014    TDAP status: Up to date  Flu Vaccine status: Up to date  Pneumococcal vaccine status: Up to date  Covid-19 vaccine status: Completed vaccines  Qualifies for Shingles Vaccine? Yes   Zostavax completed No   Shingrix Completed?: No.    Education has been provided regarding the importance of this vaccine. Patient has been advised to call insurance company to determine out of pocket expense if they have not yet received this vaccine. Advised may also receive vaccine at local pharmacy or Health Dept. Verbalized acceptance and understanding.  Screening Tests Health Maintenance  Topic Date Due   Zoster Vaccines- Shingrix (1 of 2) 04/13/2022 (Originally 12/18/1994)   HEMOGLOBIN A1C  07/12/2022   OPHTHALMOLOGY EXAM  12/07/2022   Diabetic kidney evaluation - eGFR measurement  01/12/2023   Diabetic kidney evaluation - Urine ACR  01/12/2023   FOOT EXAM  01/12/2023   Medicare Annual Wellness (AWV)  03/06/2023    DTaP/Tdap/Td (3 - Tdap) 06/07/2024   Pneumonia Vaccine 67+ Years old  Completed   INFLUENZA VACCINE  Completed   DEXA SCAN  Completed   Hepatitis C Screening  Completed   HPV VACCINES  Aged Out   COLONOSCOPY (Pts 45-104yr Insurance coverage will need to be confirmed)  Discontinued   COVID-19 Vaccine  Discontinued    Health Maintenance  There are no preventive care reminders to display for this patient.   Colorectal cancer screening: No longer required.   Mammogram status: Completed 01/25/2022. Repeat every year  Bone Density status: Completed 12/16/2015. Results reflect: Bone density results: OSTEOPENIA. Repeat every 3 years.  Lung Cancer Screening: (Low Dose CT Chest recommended if Age 78-80years, 30 pack-year currently smoking OR have quit w/in 15years.) does not qualify.   Lung Cancer Screening Referral: no  Additional Screening:  Hepatitis C Screening: does qualify; Completed 02/02/2020  Vision Screening: Recommended annual ophthalmology exams for early detection of glaucoma and other disorders of the eye. Is the patient up to date with their annual eye exam?  Yes  Who is the provider or what is the name of the office in which the patient attends annual eye exams? GDeloria Lair MD. If pt is not established with a provider, would they like to be referred to a provider to establish care? No .   Dental Screening: Recommended annual dental exams for proper oral hygiene  Community Resource Referral / Chronic Care Management: CRR required this visit?  No   CCM required this visit?  No      Plan:     I have personally reviewed and noted the following in the patient's chart:   Medical and social history Use of alcohol, tobacco or illicit drugs  Current medications and supplements including opioid prescriptions. Patient is not currently taking opioid prescriptions. Functional ability and status Nutritional status Physical activity Advanced directives List of other  physicians Hospitalizations, surgeries, and ER visits in previous 12 months Vitals Screenings to include cognitive, depression, and falls Referrals and appointments  In addition, I have reviewed and discussed with patient certain preventive protocols, quality metrics, and best practice recommendations. A written personalized care plan for preventive services as well as general preventive health recommendations were provided to patient.     SSheral Flow LPN   17/05/812  Nurse Notes: N/A

## 2022-03-05 NOTE — Patient Instructions (Signed)
Brandi Shaffer , Thank you for taking time to come for your Medicare Wellness Visit. I appreciate your ongoing commitment to your health goals. Please review the following plan we discussed and let me know if I can assist you in the future.   These are the goals we discussed:  Goals      My goal for 2024 is to lose some weight. I want to do me this year.        This is a list of the screening recommended for you and due dates:  Health Maintenance  Topic Date Due   Zoster (Shingles) Vaccine (1 of 2) 04/13/2022*   Hemoglobin A1C  07/12/2022   Eye exam for diabetics  12/07/2022   Yearly kidney function blood test for diabetes  01/12/2023   Yearly kidney health urinalysis for diabetes  01/12/2023   Complete foot exam   01/12/2023   Medicare Annual Wellness Visit  03/06/2023   DTaP/Tdap/Td vaccine (3 - Tdap) 06/07/2024   Pneumonia Vaccine  Completed   Flu Shot  Completed   DEXA scan (bone density measurement)  Completed   Hepatitis C Screening: USPSTF Recommendation to screen - Ages 36-79 yo.  Completed   HPV Vaccine  Aged Out   Colon Cancer Screening  Discontinued   COVID-19 Vaccine  Discontinued  *Topic was postponed. The date shown is not the original due date.    Advanced directives: No  Conditions/risks identified: Yes; Type II Diabetes  Next appointment: Follow up in one year for your annual wellness visit.   Preventive Care 51 Years and Older, Female Preventive care refers to lifestyle choices and visits with your health care provider that can promote health and wellness. What does preventive care include? A yearly physical exam. This is also called an annual well check. Dental exams once or twice a year. Routine eye exams. Ask your health care provider how often you should have your eyes checked. Personal lifestyle choices, including: Daily care of your teeth and gums. Regular physical activity. Eating a healthy diet. Avoiding tobacco and drug use. Limiting alcohol  use. Practicing safe sex. Taking low-dose aspirin every day. Taking vitamin and mineral supplements as recommended by your health care provider. What happens during an annual well check? The services and screenings done by your health care provider during your annual well check will depend on your age, overall health, lifestyle risk factors, and family history of disease. Counseling  Your health care provider may ask you questions about your: Alcohol use. Tobacco use. Drug use. Emotional well-being. Home and relationship well-being. Sexual activity. Eating habits. History of falls. Memory and ability to understand (cognition). Work and work Statistician. Reproductive health. Screening  You may have the following tests or measurements: Height, weight, and BMI. Blood pressure. Lipid and cholesterol levels. These may be checked every 5 years, or more frequently if you are over 73 years old. Skin check. Lung cancer screening. You may have this screening every year starting at age 37 if you have a 30-pack-year history of smoking and currently smoke or have quit within the past 15 years. Fecal occult blood test (FOBT) of the stool. You may have this test every year starting at age 93. Flexible sigmoidoscopy or colonoscopy. You may have a sigmoidoscopy every 5 years or a colonoscopy every 10 years starting at age 24. Hepatitis C blood test. Hepatitis B blood test. Sexually transmitted disease (STD) testing. Diabetes screening. This is done by checking your blood sugar (glucose) after you have not  eaten for a while (fasting). You may have this done every 1-3 years. Bone density scan. This is done to screen for osteoporosis. You may have this done starting at age 11. Mammogram. This may be done every 1-2 years. Talk to your health care provider about how often you should have regular mammograms. Talk with your health care provider about your test results, treatment options, and if necessary,  the need for more tests. Vaccines  Your health care provider may recommend certain vaccines, such as: Influenza vaccine. This is recommended every year. Tetanus, diphtheria, and acellular pertussis (Tdap, Td) vaccine. You may need a Td booster every 10 years. Zoster vaccine. You may need this after age 18. Pneumococcal 13-valent conjugate (PCV13) vaccine. One dose is recommended after age 58. Pneumococcal polysaccharide (PPSV23) vaccine. One dose is recommended after age 3. Talk to your health care provider about which screenings and vaccines you need and how often you need them. This information is not intended to replace advice given to you by your health care provider. Make sure you discuss any questions you have with your health care provider. Document Released: 03/11/2015 Document Revised: 11/02/2015 Document Reviewed: 12/14/2014 Elsevier Interactive Patient Education  2017 [place] Prevention in the Home Falls can cause injuries. They can happen to people of all ages. There are many things you can do to make your home safe and to help prevent falls. What can I do on the outside of my home? Regularly fix the edges of walkways and driveways and fix any cracks. Remove anything that might make you trip as you walk through a door, such as a raised step or threshold. Trim any bushes or trees on the path to your home. Use bright outdoor lighting. Clear any walking paths of anything that might make someone trip, such as rocks or tools. Regularly check to see if handrails are loose or broken. Make sure that both sides of any steps have handrails. Any raised decks and porches should have guardrails on the edges. Have any leaves, snow, or ice cleared regularly. Use sand or salt on walking paths during winter. Clean up any spills in your garage right away. This includes oil or grease spills. What can I do in the bathroom? Use night lights. Install grab bars by the toilet and in the  tub and shower. Do not use towel bars as grab bars. Use non-skid mats or decals in the tub or shower. If you need to sit down in the shower, use a plastic, non-slip stool. Keep the floor dry. Clean up any water that spills on the floor as soon as it happens. Remove soap buildup in the tub or shower regularly. Attach bath mats securely with double-sided non-slip rug tape. Do not have throw rugs and other things on the floor that can make you trip. What can I do in the bedroom? Use night lights. Make sure that you have a light by your bed that is easy to reach. Do not use any sheets or blankets that are too big for your bed. They should not hang down onto the floor. Have a firm chair that has side arms. You can use this for support while you get dressed. Do not have throw rugs and other things on the floor that can make you trip. What can I do in the kitchen? Clean up any spills right away. Avoid walking on wet floors. Keep items that you use a lot in easy-to-reach places. If you need to reach  something above you, use a strong step stool that has a grab bar. Keep electrical cords out of the way. Do not use floor polish or wax that makes floors slippery. If you must use wax, use non-skid floor wax. Do not have throw rugs and other things on the floor that can make you trip. What can I do with my stairs? Do not leave any items on the stairs. Make sure that there are handrails on both sides of the stairs and use them. Fix handrails that are broken or loose. Make sure that handrails are as long as the stairways. Check any carpeting to make sure that it is firmly attached to the stairs. Fix any carpet that is loose or worn. Avoid having throw rugs at the top or bottom of the stairs. If you do have throw rugs, attach them to the floor with carpet tape. Make sure that you have a light switch at the top of the stairs and the bottom of the stairs. If you do not have them, ask someone to add them for  you. What else can I do to help prevent falls? Wear shoes that: Do not have high heels. Have rubber bottoms. Are comfortable and fit you well. Are closed at the toe. Do not wear sandals. If you use a stepladder: Make sure that it is fully opened. Do not climb a closed stepladder. Make sure that both sides of the stepladder are locked into place. Ask someone to hold it for you, if possible. Clearly mark and make sure that you can see: Any grab bars or handrails. First and last steps. Where the edge of each step is. Use tools that help you move around (mobility aids) if they are needed. These include: Canes. Walkers. Scooters. Crutches. Turn on the lights when you go into a dark area. Replace any light bulbs as soon as they burn out. Set up your furniture so you have a clear path. Avoid moving your furniture around. If any of your floors are uneven, fix them. If there are any pets around you, be aware of where they are. Review your medicines with your doctor. Some medicines can make you feel dizzy. This can increase your chance of falling. Ask your doctor what other things that you can do to help prevent falls. This information is not intended to replace advice given to you by your health care provider. Make sure you discuss any questions you have with your health care provider. Document Released: 12/09/2008 Document Revised: 07/21/2015 Document Reviewed: 03/19/2014 Elsevier Interactive Patient Education  2017 [employer].

## 2022-03-14 ENCOUNTER — Other Ambulatory Visit (HOSPITAL_COMMUNITY): Payer: Medicare Other

## 2022-03-20 ENCOUNTER — Telehealth: Payer: Self-pay | Admitting: Internal Medicine

## 2022-03-20 NOTE — Telephone Encounter (Unsigned)
Patient called and stated she feels the water pill, hydrochlorothiazide 12.'5mg'$ , isnt working and it makes her feel bad she feels it may be clashing with her blood pressure pills. She would like to know if something else can be prescribed for her. She would like a callback at [phone].

## 2022-03-20 NOTE — Telephone Encounter (Signed)
As there is no direct alternative, please make ROV

## 2022-03-21 NOTE — Telephone Encounter (Signed)
Patient scheduled for 03/26/22 at 3:20

## 2022-03-26 ENCOUNTER — Ambulatory Visit (INDEPENDENT_AMBULATORY_CARE_PROVIDER_SITE_OTHER): Payer: 59 | Admitting: Internal Medicine

## 2022-03-26 VITALS — BP 132/80 | HR 95 | Temp 98.1°F | Ht 59.0 in | Wt 185.0 lb

## 2022-03-26 DIAGNOSIS — R609 Edema, unspecified: Secondary | ICD-10-CM

## 2022-03-26 DIAGNOSIS — R600 Localized edema: Secondary | ICD-10-CM | POA: Insufficient documentation

## 2022-03-26 DIAGNOSIS — E1165 Type 2 diabetes mellitus with hyperglycemia: Secondary | ICD-10-CM

## 2022-03-26 DIAGNOSIS — I10 Essential (primary) hypertension: Secondary | ICD-10-CM

## 2022-03-26 DIAGNOSIS — E559 Vitamin D deficiency, unspecified: Secondary | ICD-10-CM | POA: Diagnosis not present

## 2022-03-26 MED ORDER — FUROSEMIDE 20 MG PO TABS: 20.0000 mg | ORAL_TABLET | Freq: Every day | ORAL | 3 refills | Status: AC | PRN

## 2022-03-26 NOTE — Patient Instructions (Addendum)
Ok to stop the Hydrochlorothiazide (microzide) as you have  Please take all new medication as prescribed - the Lasix (furosemide) at 20 mg per day as needed only  Please continue all other medications as before, and refills have been done if requested.  Please have the pharmacy call with any other refills you may need.  Please keep your appointments with your specialists as you may have planned - the Echocardiogram on Feb 6  Please go to the LAB at the blood drawing area for the tests to be done - after the Echo on Feb 6 or soon after  .You will be contacted by phone if any changes need to be made immediately.  Otherwise, you will receive a letter about your results with an explanation, but please check with MyChart first.  Please remember to sign up for MyChart if you have not done so, as this will be important to you in the future with finding out test results, communicating by private email, and scheduling acute appointments online when needed.  Please make an Appointment to return in 3 months, or sooner if needed

## 2022-03-26 NOTE — Progress Notes (Unsigned)
Patient ID: Brandi Shaffer, female   DOB: 19-Jul-1945, 77 y.o.   MRN: [id number]        Chief Complaint: follow up leg swelling, htn, low vit d, dm       HPI:  Brandi Shaffer is a 77 y.o. female here overall doing well, recently started on lasix 20 mg qd and K supplement, but is very concerned bc the losartan bottle already has potassium in the name.  Pt is planned for f/u K with cardiology feb 6  Pt denies chest pain, increased sob or doe, wheezing, orthopnea, PND, increased LE swelling, palpitations, dizziness or syncope.   Pt denies polydipsia, polyuria, or new focal neuro s/s.    Pt denies fever, wt loss, night sweats, loss of appetite, or other constitutional symptoms         Wt Readings from Last 3 Encounters:  03/26/22 185 lb (83.9 kg)  03/05/22 178 lb (80.7 kg)  01/11/22 179 lb (81.2 kg)   BP Readings from Last 3 Encounters:  03/26/22 132/80  01/11/22 130/82  12/14/21 (!) 159/89         Past Medical History:  Diagnosis Date   ANEMIA-IRON DEFICIENCY 12/06/2009   Anxiety state 07/01/2015   Asthma 12/05/2011   DIABETES MELLITUS, TYPE II 09/17/2006   DIVERTICULOSIS, COLON 12/06/2009   GENITAL HERPES 03/24/2010   GERD (gastroesophageal reflux disease) 06/28/2011   HYPERLIPIDEMIA 03/24/2010   HYPERTENSION 09/17/2006   HYPOTHYROIDISM 09/17/2006   LIPOMA 12/06/2009   PERIPHERAL EDEMA 03/24/2010   Renal cyst 07/05/2011   1.9 cm minimally complex   Past Surgical History:  Procedure Laterality Date   ABDOMINAL HYSTERECTOMY     fibroids   ankle surgury     x 3 left - chronic pain/swelling   COLONOSCOPY  2011   SHOULDER SURGERY Left    TONSILLECTOMY     TUBAL LIGATION      reports that she has never smoked. She has never used smokeless tobacco. She reports that she does not drink alcohol and does not use drugs. family history includes Breast cancer (age of onset: 36) in her sister; Diabetes in an other family member; Heart disease in her sister; Hypertension in her father and mother;  Joint hypermobility in an other family member; Prostate cancer in her brother; Stroke in her sister. Allergies  Allergen Reactions   Januvia [Sitagliptin Phosphate]     dizziness   Lipitor [Atorvastatin] Other (See Comments)    Myalgia    Metformin And Related Nausea Only   Doxycycline Rash   Current Outpatient Medications on File Prior to Visit  Medication Sig Dispense Refill   albuterol (VENTOLIN HFA) 108 (90 Base) MCG/ACT inhaler TAKE 2 PUFFS BY MOUTH EVERY 6 HOURS AS NEEDED FOR WHEEZE 8.5 each 3   amLODipine (NORVASC) 10 MG tablet TAKE 1 TABLET BY MOUTH EVERY DAY 90 tablet 3   ammonium lactate (LAC-HYDRIN) 12 % lotion APPLY 1 APPLICATION TOPICALLY AS NEEDED FOR DRY SKIN. 400 mL 5   aspirin 81 MG EC tablet TAKE 1 TABLET BY MOUTH DAILY. SWALLOW WHOLE. 30 tablet 11   Cholecalciferol 50 MCG (2000 UT) TABS 1 tab by mouth once daily 30 tablet 99   clotrimazole (LOTRIMIN) 1 % cream Apply to affected feet and between toes twice daily for 6 weeks for athlete's feet 60 g 1   losartan (COZAAR) 25 MG tablet Take 1 tablet (25 mg total) by mouth daily. 90 tablet 3   Multiple Vitamins-Minerals (WOMENS MULTIVITAMIN PO) Take 1  tablet by mouth daily.     OneTouch Delica Lancets 33G MISC 1 each by Does not apply route 2 (two) times daily. Use to check blood sugars twice a day Dx E11.9 200 each 11   ONETOUCH VERIO test strip USE 2 (TWO) TIMES DAILY. DX E11.9 100 strip 4   pioglitazone (ACTOS) 15 MG tablet TAKE 1 TABLET BY MOUTH EVERY DAY 90 tablet 2   potassium chloride (KLOR-CON) 10 MEQ tablet TAKE 1 TABLET BY MOUTH EVERY DAY 90 tablet 3   rosuvastatin (CRESTOR) 20 MG tablet Take 1 tablet (20 mg total) by mouth daily. 90 tablet 3   vitamin B-12 (CYANOCOBALAMIN) 1000 MCG tablet 1 tab by mouth mon - wed - thur 90 tablet 3   No current facility-administered medications on file prior to visit.        ROS:  All others reviewed and negative.  Objective        PE:  BP 132/80 (BP Location: Right Arm,  Patient Position: Sitting, Cuff Size: Large)   Pulse 95   Temp 98.1 F (36.7 C) (Oral)   Ht 4\' 11"  (1.499 m)   Wt 185 lb (83.9 kg)   SpO2 97%   BMI 37.37 kg/m                 Constitutional: Pt appears in NAD               HENT: Head: NCAT.                Right Ear: External ear normal.                 Left Ear: External ear normal.                Eyes: . Pupils are equal, round, and reactive to light. Conjunctivae and EOM are normal               Nose: without d/c or deformity               Neck: Neck supple. Gross normal ROM               Cardiovascular: Normal rate and regular rhythm.                 Pulmonary/Chest: Effort normal and breath sounds without rales or wheezing.                Abd:  Soft, NT, ND, + BS, no organomegaly               Neurological: Pt is alert. At baseline orientation, motor grossly intact               Skin: Skin is warm. No rashes, no other new lesions, LE edema - trace pedal bilateral               Psychiatric: Pt behavior is normal without agitation   Micro: none  Cardiac tracings I have personally interpreted today:  none  Pertinent Radiological findings (summarize): none   Lab Results  Component Value Date   WBC 5.2 01/11/2022   HGB 13.4 01/11/2022   HCT 39.8 01/11/2022   PLT 236.0 01/11/2022   GLUCOSE 87 01/11/2022   CHOL 182 01/11/2022   TRIG 72.0 01/11/2022   HDL 56.60 01/11/2022   LDLDIRECT 134.6 05/23/2010   LDLCALC 111 (H) 01/11/2022   ALT 14 01/11/2022   AST 20 01/11/2022   NA 143 01/11/2022  K 3.7 01/11/2022   CL 108 01/11/2022   CREATININE 0.88 01/11/2022   BUN 20 01/11/2022   CO2 29 01/11/2022   TSH 1.94 01/11/2022   HGBA1C 6.0 01/11/2022   MICROALBUR 1.1 01/11/2022   Assessment/Plan:  Brandi Shaffer is a 77 y.o. Black or African American [2] female with  has a past medical history of ANEMIA-IRON DEFICIENCY (12/06/2009), Anxiety state (07/01/2015), Asthma (12/05/2011), DIABETES MELLITUS, TYPE II (09/17/2006),  DIVERTICULOSIS, COLON (12/06/2009), GENITAL HERPES (03/24/2010), GERD (gastroesophageal reflux disease) (06/28/2011), HYPERLIPIDEMIA (03/24/2010), HYPERTENSION (09/17/2006), HYPOTHYROIDISM (09/17/2006), LIPOMA (12/06/2009), PERIPHERAL EDEMA (03/24/2010), and Renal cyst (07/05/2011).  Diabetes Lab Results  Component Value Date   HGBA1C 6.0 01/11/2022   Stable, pt to continue current medical treatment actos 15 mg qd   Essential hypertension BP Readings from Last 3 Encounters:  03/26/22 132/80  01/11/22 130/82  12/14/21 (!) 159/89   Stable, pt to continue medical treatment norvasc 5 mg qd, losartan 25 mg qd   Peripheral edema Pt encouraged to take all meds as prescribed, and no need for undue concern about potassium word also listed on her losartan bottle, cont lasix 20 qd prn, f/u lab as recommended  Vitamin D deficiency Last vitamin D Lab Results  Component Value Date   VD25OH 33.64 01/11/2022   Low, to start  oral replacement  Followup: No follow-ups on file.  Oliver Barre, MD 03/28/2022 5:02 AM [facility]

## 2022-03-28 ENCOUNTER — Encounter: Payer: Self-pay | Admitting: Internal Medicine

## 2022-03-28 NOTE — Assessment & Plan Note (Signed)
Lab Results  Component Value Date   HGBA1C 6.0 01/11/2022   Stable, pt to continue current medical treatment actos 15 mg qd

## 2022-03-28 NOTE — Assessment & Plan Note (Signed)
Pt encouraged to take all meds as prescribed, and no need for undue concern about potassium word also listed on her losartan bottle, cont lasix 20 qd prn, f/u lab as recommended

## 2022-03-28 NOTE — Assessment & Plan Note (Signed)
BP Readings from Last 3 Encounters:  03/26/22 132/80  01/11/22 130/82  12/14/21 (!) 159/89   Stable, pt to continue medical treatment norvasc 5 mg qd, losartan 25 mg qd

## 2022-03-28 NOTE — Assessment & Plan Note (Signed)
Last vitamin D Lab Results  Component Value Date   VD25OH 33.64 01/11/2022   Low, to start  oral replacement

## 2022-04-03 ENCOUNTER — Ambulatory Visit (HOSPITAL_COMMUNITY): Payer: 59 | Attending: Internal Medicine

## 2022-04-03 ENCOUNTER — Other Ambulatory Visit (INDEPENDENT_AMBULATORY_CARE_PROVIDER_SITE_OTHER): Payer: 59

## 2022-04-03 DIAGNOSIS — R609 Edema, unspecified: Secondary | ICD-10-CM | POA: Diagnosis not present

## 2022-04-03 DIAGNOSIS — I350 Nonrheumatic aortic (valve) stenosis: Secondary | ICD-10-CM

## 2022-04-03 LAB — ECHOCARDIOGRAM COMPLETE
AR max vel: 2.7 cm2
AV Area VTI: 2.65 cm2
AV Area mean vel: 2.67 cm2
AV Mean grad: 8 mmHg
AV Peak grad: 14.3 mmHg
Ao pk vel: 1.89 m/s
Area-P 1/2: 4.41 cm2
S' Lateral: 2.15 cm

## 2022-04-03 LAB — HEPATIC FUNCTION PANEL
ALT: 12 U/L (ref 0–35)
AST: 18 U/L (ref 0–37)
Albumin: 4.3 g/dL (ref 3.5–5.2)
Alkaline Phosphatase: 106 U/L (ref 39–117)
Bilirubin, Direct: 0.1 mg/dL (ref 0.0–0.3)
Total Bilirubin: 0.5 mg/dL (ref 0.2–1.2)
Total Protein: 7.1 g/dL (ref 6.0–8.3)

## 2022-04-03 LAB — BASIC METABOLIC PANEL
BUN: 14 mg/dL (ref 6–23)
CO2: 28 mEq/L (ref 19–32)
Calcium: 9.2 mg/dL (ref 8.4–10.5)
Chloride: 106 mEq/L (ref 96–112)
Creatinine, Ser: 0.84 mg/dL (ref 0.40–1.20)
GFR: 67.09 mL/min (ref >60.00)
Glucose, Bld: 118 mg/dL — ABNORMAL HIGH (ref 70–99)
Potassium: 3.8 mEq/L (ref 3.5–5.1)
Sodium: 143 mEq/L (ref 135–145)

## 2022-04-03 LAB — CBC WITH DIFFERENTIAL/PLATELET
Basophils Absolute: 0 10*3/uL (ref 0.0–0.1)
Basophils Relative: 0.7 % (ref 0.0–3.0)
Eosinophils Absolute: 0.1 10*3/uL (ref 0.0–0.7)
Eosinophils Relative: 2.6 % (ref 0.0–5.0)
HCT: 39.7 % (ref 36.0–46.0)
Hemoglobin: 13.2 g/dL (ref 12.0–15.0)
Lymphocytes Relative: 43.1 % (ref 12.0–46.0)
Lymphs Abs: 2.3 10*3/uL (ref 0.7–4.0)
MCHC: 33.2 g/dL (ref 30.0–36.0)
MCV: 93.7 fl (ref 78.0–100.0)
Monocytes Absolute: 0.5 10*3/uL (ref 0.1–1.0)
Monocytes Relative: 8.6 % (ref 3.0–12.0)
Neutro Abs: 2.4 10*3/uL (ref 1.4–7.7)
Neutrophils Relative %: 45 % (ref 43.0–77.0)
Platelets: 248 10*3/uL (ref 150.0–400.0)
RBC: 4.23 Mil/uL (ref 3.87–5.11)
RDW: 14.4 % (ref 11.5–15.5)
WBC: 5.2 10*3/uL (ref 4.0–10.5)

## 2022-04-05 ENCOUNTER — Encounter: Payer: Self-pay | Admitting: Internal Medicine

## 2022-04-05 ENCOUNTER — Ambulatory Visit (INDEPENDENT_AMBULATORY_CARE_PROVIDER_SITE_OTHER): Payer: 59 | Admitting: Internal Medicine

## 2022-04-05 VITALS — BP 165/81 | HR 87 | Temp 97.3°F | Ht 59.0 in | Wt 182.2 lb

## 2022-04-05 DIAGNOSIS — M62838 Other muscle spasm: Secondary | ICD-10-CM | POA: Diagnosis not present

## 2022-04-05 DIAGNOSIS — K047 Periapical abscess without sinus: Secondary | ICD-10-CM

## 2022-04-05 DIAGNOSIS — B3731 Acute candidiasis of vulva and vagina: Secondary | ICD-10-CM | POA: Diagnosis not present

## 2022-04-05 HISTORY — DX: Periapical abscess without sinus: K04.7

## 2022-04-05 MED ORDER — CYCLOBENZAPRINE HCL 5 MG PO TABS: 5.0000 mg | ORAL_TABLET | Freq: Three times a day (TID) | ORAL | 1 refills | Status: DC | PRN

## 2022-04-05 MED ORDER — FLUCONAZOLE 150 MG PO TABS: 150.0000 mg | ORAL_TABLET | ORAL | 1 refills | Status: DC | PRN

## 2022-04-05 MED ORDER — AMOXICILLIN-POT CLAVULANATE 875-125 MG PO TABS: 1.0000 | ORAL_TABLET | Freq: Two times a day (BID) | ORAL | 0 refills | Status: DC

## 2022-04-05 MED ORDER — CHLORHEXIDINE GLUCONATE 0.12 % MT SOLN: 15.0000 mL | Freq: Two times a day (BID) | OROMUCOSAL | 11 refills | Status: DC

## 2022-04-05 MED ORDER — CHLORHEXIDINE GLUCONATE 0.12% ORAL RINSE (MEDLINE KIT): 15.0000 mL | Freq: Two times a day (BID) | OROMUCOSAL | 0 refills | Status: DC

## 2022-04-05 NOTE — Patient Instructions (Signed)
It was a pleasure seeing you today!  Your health and satisfaction are my top priorities. If you believe your experience today was worthy of a 5-star rating, I'd be grateful for your feedback! Loralee Pacas, MD   CHECKOUT CHECKLIST  '[]'$    Schedule next appointment(s):    With Primary Care Provider (PCP) in 1-3 months With dentist in 2-4 weeks If you are not doing well:  Return to the office sooner Please bring all your medicine bottles to each appointment If your condition begins to worsen or become severe:  go to the emergency room or even call 911  '[]'$   (Optional):  Review your clinical notes on MyChart after they are completed.     Today's draft of the physician documented plan for today's visit: (final revisions will be visible on MyChart chart later) Dental infection -     Amoxicillin-Pot Clavulanate; Take 1 tablet by mouth 2 (two) times daily.  Dispense: 20 tablet; Refill: 0 -     Fluconazole; Take 1 tablet (150 mg total) by mouth every three (3) days as needed.  Dispense: 2 tablet; Refill: 1 -     Chlorhexidine Gluconate; Use as directed 15 mLs in the mouth or throat 2 (two) times daily.  Dispense: 473 mL; Refill: 11  Muscle spasm -     Cyclobenzaprine HCl; Take 1 tablet (5 mg total) by mouth 3 (three) times daily as needed for muscle spasms.  Dispense: 30 tablet; Refill: 1      QUESTIONS & CONCERNS: CLINICAL: please contact us via phone [phone] OR MyChart messaging  LAB & IMAGING:   We will call you if the results are significantly abnormal or you don't use MyChart.  Most normal results will be posted to MyChart immediately and have a clinical review message by Dr. Randol Kern posted within 2-3 business days.   If you have not heard from [place] regarding the results in 2 weeks OR if you need priority reporting, please contact this office. MYCHART:  The fastest way to get your results and easiest way to stay in touch with [place] is by activating your My Chart account. Instructions are  located on the last page of this paperwork.  BILLING: xray and lab orders are billed from separate companies and questions./concerns should be directed to the [place].  For visit charges please discuss with our administrative services COMPLAINTS:  please let Dr. Randol Kern know or see the [place], by asking at the front desk: we want you to be satisfied with every experience and we would be grateful for the opportunity to address any problems

## 2022-04-05 NOTE — Progress Notes (Signed)
[facility]: [phone]   Routine Medical Office Visit  Patient:  Brandi Shaffer      Age: 77 y.o.       Sex:  female  Date:   04/05/2022  PCP:    Biagio Borg, MD   [place] Provider: Loralee Pacas, MD   Problem Focused Charting:   Medical Decision Making per Assessment/Plan   Lilian was seen today for pain in both ears and dental problem.  Dental infection Overview: I saw Mrs. Frieson in October 2023 and treated her for what was an ear infection and she returns with similar symptoms.  She improved with Augmentin at that time and so I believe Augmentin will work again.  However this time I noticed that she has very swollen and abnormal appearing gums and this is associated with difficulty wearing dentures/plates and she reports that there is tenderness and pain there although she cannot remember if it was there when I last treated her ear infection.  I suspect that she has recurring dental infection due to poor fitting dental prosthetics and this is causing recurrent head and neck infections that are getting into her ear.  Today she also has some muscle spasm and lymphadenopathy in the left neck with tender lymph node in the submandibular region and tender gums.  The left side of the gums is more tender than the right and this is consistent with the neck findings strongly indicating that this is a primary dental infection.  I assured her that should clear up just fine with the Augmentin but that it will probably also come right back if she does not get in with a dentist and convince them that this is going to keep happening and that some sort of new type of prosthetic is needed versus perhaps an abscess draining or something else I recommend getting dental x-rays completed at that visit and doing at least 2 weeks after this visit so there is time for the antibiotic to do everything it can.  Orders: -     Amoxicillin-Pot Clavulanate; Take 1 tablet by mouth 2  (two) times daily.  Dispense: 20 tablet; Refill: 0 -     Chlorhexidine Gluconate; Use as directed 15 mLs in the mouth or throat 2 (two) times daily.  Dispense: 473 mL; Refill: 11  Muscle spasm -     Cyclobenzaprine HCl; Take 1 tablet (5 mg total) by mouth 3 (three) times daily as needed for muscle spasms.  Dispense: 30 tablet; Refill: 1  Vaginal yeast infection -     Fluconazole; Take 1 tablet (150 mg total) by mouth every three (3) days as needed.  Dispense: 2 tablet; Refill: 1  Got yeast infection after Augmentin last time will set her up to have diflucan if she needs it again.  Encouraged her to follow up with dental after antibiotic(s) to see if prosthetics need adjusted / replaced and xrays for evaluate for abscess. And follow up with Primary Care Provider (PCP) after that regarding blood pressure and review dental plan.    Subjective - Clinical Presentation:   Brandi Shaffer is a 77 y.o. female  Patient Active Problem List   Diagnosis Date Noted   Dental infection 04/05/2022   Peripheral edema 03/26/2022   Leg length discrepancy 09/28/2020   Greater trochanteric bursitis, right 08/09/2020   Posterior vitreous detachment of right eye 02/18/2020   Posterior vitreous detachment of left eye 02/18/2020   Diabetes mellitus without complication ([place]) 79/39/0300  Nuclear sclerotic cataract of both eyes 02/18/2020   Urinary frequency 01/11/2020   Pelvic pain 01/11/2020   Vitamin D deficiency 01/11/2020   B12 deficiency 01/11/2020   Superficial phlebitis 09/03/2019   Pain of right thumb 01/06/2019   Sprain of second toe, left, initial encounter 12/18/2017   Acute upper respiratory infection 01/04/2017   Greater trochanteric bursitis of left hip 12/19/2015   Right otitis media 12/16/2015   Shoulder dislocation 08/03/2015   Anxiety state 07/01/2015   Cellulitis 04/01/2015   Osteoarthritis of left ankle 04/01/2015   Bilateral leg pain 02/15/2015   PVD (peripheral vascular disease)  ([place]) 12/22/2014   Cellulitis of right lower leg 12/13/2014   Right leg pain 11/10/2014   Hx of colonic polyps 09/08/2013   Obesity (BMI 30-39.9) 09/08/2013   Impingement syndrome of left shoulder 06/04/2012   Mild aortic stenosis 12/05/2011   Asthma 12/05/2011   Renal cyst 07/05/2011   Low back pain 06/28/2011   GERD (gastroesophageal reflux disease) 06/28/2011   Carpal tunnel syndrome of right wrist 04/22/2011   Right foot pain 05/23/2010   Encounter for well adult exam with abnormal findings 05/23/2010   GENITAL HERPES 03/24/2010   Hyperlipidemia 03/24/2010   Nocturia 03/24/2010   LIPOMA 12/06/2009   ANEMIA-IRON DEFICIENCY 12/06/2009   CONJUNCTIVITIS, BILATERAL 12/06/2009   DIVERTICULOSIS, COLON 12/06/2009   HYPOTHYROIDISM 09/17/2006   Diabetes ([place]) 09/17/2006   Essential hypertension 09/17/2006   Past Medical History:  Diagnosis Date   ANEMIA-IRON DEFICIENCY 12/06/2009   Anxiety state 07/01/2015   Asthma 12/05/2011   DIABETES MELLITUS, TYPE II 09/17/2006   DIVERTICULOSIS, COLON 12/06/2009   GENITAL HERPES 03/24/2010   GERD (gastroesophageal reflux disease) 06/28/2011   HYPERLIPIDEMIA 03/24/2010   HYPERTENSION 09/17/2006   HYPOTHYROIDISM 09/17/2006   LIPOMA 12/06/2009   PERIPHERAL EDEMA 03/24/2010   Renal cyst 07/05/2011   1.9 cm minimally complex    Outpatient Medications Prior to Visit  Medication Sig   albuterol (VENTOLIN HFA) 108 (90 Base) MCG/ACT inhaler TAKE 2 PUFFS BY MOUTH EVERY 6 HOURS AS NEEDED FOR WHEEZE   amLODipine (NORVASC) 10 MG tablet TAKE 1 TABLET BY MOUTH EVERY DAY   ammonium lactate (LAC-HYDRIN) 12 % lotion APPLY 1 APPLICATION TOPICALLY AS NEEDED FOR DRY SKIN.   aspirin 81 MG EC tablet TAKE 1 TABLET BY MOUTH DAILY. SWALLOW WHOLE.   Cholecalciferol 50 MCG (2000 UT) TABS 1 tab by mouth once daily   clotrimazole (LOTRIMIN) 1 % cream Apply to affected feet and between toes twice daily for 6 weeks for athlete's feet   furosemide (LASIX) 20 MG tablet Take 1  tablet (20 mg total) by mouth daily as needed.   losartan (COZAAR) 25 MG tablet Take 1 tablet (25 mg total) by mouth daily.   Multiple Vitamins-Minerals (WOMENS MULTIVITAMIN PO) Take 1 tablet by mouth daily.   OneTouch Delica Lancets 42A MISC 1 each by Does not apply route 2 (two) times daily. Use to check blood sugars twice a day Dx E11.9   ONETOUCH VERIO test strip USE 2 (TWO) TIMES DAILY. DX E11.9   pioglitazone (ACTOS) 15 MG tablet TAKE 1 TABLET BY MOUTH EVERY DAY   potassium chloride (KLOR-CON) 10 MEQ tablet TAKE 1 TABLET BY MOUTH EVERY DAY   rosuvastatin (CRESTOR) 20 MG tablet Take 1 tablet (20 mg total) by mouth daily.   vitamin B-12 (CYANOCOBALAMIN) 1000 MCG tablet 1 tab by mouth mon - wed - thur   No facility-administered medications prior to visit.    Chief Complaint  Patient presents with   Pain in both ears    Was seen previously for this.   Dental Problem    HPI   Has spasm in neck Has tender lymph node in neck Pain in left > right gums Pain in ear and going down into neck Augmenting for similar ear pain helped 5 months ago Struggling to wear dental plates         Objective:  Physical Exam  BP (!) 165/81 (BP Location: Left Arm, Patient Position: Sitting)   Pulse 87   Temp (!) 97.3 F (36.3 C) (Temporal)   Ht '4\' 11"'$  (1.499 m)   Wt 182 lb 3.2 oz (82.6 kg)   SpO2 99%   BMI 36.80 kg/m  Obese  by BMI criteria but truncal adiposity (waist circumference or caliper) should be used instead. Wt Readings from Last 10 Encounters:  04/05/22 182 lb 3.2 oz (82.6 kg)  03/26/22 185 lb (83.9 kg)  03/05/22 178 lb (80.7 kg)  01/11/22 179 lb (81.2 kg)  12/14/21 183 lb 12.8 oz (83.4 kg)  07/11/21 179 lb (81.2 kg)  01/11/21 182 lb (82.6 kg)  11/29/20 183 lb (83 kg)  09/28/20 184 lb (83.5 kg)  08/09/20 189 lb (85.7 kg)   Vital signs reviewed. Blood pressure elevated noted but she is in pain  Nursing notes reviewed. Weight trend reviewed. General Appearance:  Well  developed, well nourished female in no acute distress.   Normal work of breathing at rest Musculoskeletal: All extremities are intact.  Neurological:  Awake, alert,  No obvious focal neurological deficits or cognitive impairments Psychiatric:  Appropriate mood, pleasant demeanor Problem-specific findings:   There is tender lymphadenopathy in left submandibular area and tenderness of pictured swollen gum and a spasmed musscle in left lateral neck.     Note swelling posterior gums is tender and plasticky appearing.99  Results Reviewed: No results found for any visits on 04/05/22.  Recent Results (from the past 2160 hour(s))  Microalbumin / creatinine urine ratio     Status: None   Collection Time: 01/11/22  8:54 AM  Result Value Ref Range   Microalb, Ur 1.1 0.0 - 1.9 mg/dL   Creatinine,U 118.4 mg/dL   Microalb Creat Ratio 0.9 0.0 - 30.0 mg/g  Hemoglobin A1c     Status: None   Collection Time: 01/11/22  8:54 AM  Result Value Ref Range   Hgb A1c MFr Bld 6.0 4.6 - 6.5 %    Comment: Glycemic Control Guidelines for People with Diabetes:Non Diabetic:  <6%Goal of Therapy: <7%Additional Action Suggested:  >8%   Lipid panel     Status: Abnormal   Collection Time: 01/11/22  8:54 AM  Result Value Ref Range   Cholesterol 182 0 - 200 mg/dL    Comment: ATP III Classification       Desirable:  < 200 mg/dL               Borderline High:  200 - 239 mg/dL          High:  > = 240 mg/dL   Triglycerides 72.0 0.0 - 149.0 mg/dL    Comment: Normal:  <150 mg/dLBorderline High:  150 - 199 mg/dL   HDL 56.60 >39.00 mg/dL   VLDL 14.4 0.0 - 40.0 mg/dL   LDL Cholesterol 111 (H) 0 - 99 mg/dL   Total CHOL/HDL Ratio 3     Comment:                Men  Women1/2 Average Risk     3.4          3.3Average Risk          5.0          4.42X Average Risk          9.6          7.13X Average Risk          15.0          11.0                       NonHDL 125.54     Comment: NOTE:  Non-HDL goal should be 30 mg/dL higher  than patient's LDL goal (i.e. LDL goal of < 70 mg/dL, would have non-HDL goal of < 100 mg/dL)  Hepatic function panel     Status: None   Collection Time: 01/11/22  8:54 AM  Result Value Ref Range   Total Bilirubin 0.6 0.2 - 1.2 mg/dL   Bilirubin, Direct 0.1 0.0 - 0.3 mg/dL   Alkaline Phosphatase 97 39 - 117 U/L   AST 20 0 - 37 U/L   ALT 14 0 - 35 U/L   Total Protein 6.9 6.0 - 8.3 g/dL   Albumin 4.2 3.5 - 5.2 g/dL  CBC with Differential/Platelet     Status: Abnormal   Collection Time: 01/11/22  8:54 AM  Result Value Ref Range   WBC 5.2 4.0 - 10.5 K/uL   RBC 4.29 3.87 - 5.11 Mil/uL   Hemoglobin 13.4 12.0 - 15.0 g/dL   HCT 39.8 36.0 - 46.0 %   MCV 92.6 78.0 - 100.0 fl   MCHC 33.6 30.0 - 36.0 g/dL   RDW 14.3 11.5 - 15.5 %   Platelets 236.0 150.0 - 400.0 K/uL   Neutrophils Relative % 41.4 (L) 43.0 - 77.0 %   Lymphocytes Relative 44.0 12.0 - 46.0 %   Monocytes Relative 10.5 3.0 - 12.0 %   Eosinophils Relative 3.3 0.0 - 5.0 %   Basophils Relative 0.8 0.0 - 3.0 %   Neutro Abs 2.1 1.4 - 7.7 K/uL   Lymphs Abs 2.3 0.7 - 4.0 K/uL   Monocytes Absolute 0.5 0.1 - 1.0 K/uL   Eosinophils Absolute 0.2 0.0 - 0.7 K/uL   Basophils Absolute 0.0 0.0 - 0.1 K/uL  TSH     Status: None   Collection Time: 01/11/22  8:54 AM  Result Value Ref Range   TSH 1.94 0.35 - 5.50 uIU/mL  Urinalysis, Routine w reflex microscopic     Status: Abnormal   Collection Time: 01/11/22  8:54 AM  Result Value Ref Range   Color, Urine YELLOW Yellow;Lt. Yellow;Straw;Dark Yellow;Amber;Green;Red;Brown   APPearance CLEAR Clear;Turbid;Slightly Cloudy;Cloudy   Specific Gravity, Urine 1.015 1.000 - 1.030   pH 7.0 5.0 - 8.0   Total Protein, Urine NEGATIVE Negative   Urine Glucose NEGATIVE Negative   Ketones, ur NEGATIVE Negative   Bilirubin Urine NEGATIVE Negative   Hgb urine dipstick NEGATIVE Negative   Urobilinogen, UA 0.2 0.0 - 1.0   Leukocytes,Ua TRACE (A) Negative   Nitrite NEGATIVE Negative   WBC, UA 0-2/hpf 0-2/hpf    RBC / HPF none seen 0-2/hpf   Mucus, UA Presence of (A) None   Squamous Epithelial / HPF Few(5-10/hpf) (A) NTIR(4-4/RXV)  Basic metabolic panel     Status: None   Collection Time: 01/11/22  8:54 AM  Result Value Ref Range   Sodium 143 135 - 145  mEq/L   Potassium 3.7 3.5 - 5.1 mEq/L   Chloride 108 96 - 112 mEq/L   CO2 29 19 - 32 mEq/L   Glucose, Bld 87 70 - 99 mg/dL   BUN 20 6 - 23 mg/dL   Creatinine, Ser 0.88 0.40 - 1.20 mg/dL   GFR 63.55 >60.00 mL/min    Comment: Calculated using the CKD-EPI Creatinine Equation (2021)   Calcium 9.1 8.4 - 10.5 mg/dL  VITAMIN D 25 Hydroxy (Vit-D Deficiency, Fractures)     Status: None   Collection Time: 01/11/22  8:54 AM  Result Value Ref Range   VITD 33.64 30.00 - 100.00 ng/mL  Vitamin B12     Status: Abnormal   Collection Time: 01/11/22  8:54 AM  Result Value Ref Range   Vitamin B-12 1,421 (H) 211 - 911 pg/mL  ECHOCARDIOGRAM COMPLETE     Status: None   Collection Time: 04/03/22  8:42 AM  Result Value Ref Range   Area-P 1/2 4.41 cm2   S' Lateral 2.15 cm   AV Area mean vel 2.67 cm2   AR max vel 2.70 cm2   AV Area VTI 2.65 cm2   Ao pk vel 1.89 m/s   AV Mean grad 8.0 mmHg   AV Peak grad 14.3 mmHg   Est EF 60 - 66%   Basic metabolic panel     Status: Abnormal   Collection Time: 04/03/22 10:44 AM  Result Value Ref Range   Sodium 143 135 - 145 mEq/L   Potassium 3.8 3.5 - 5.1 mEq/L   Chloride 106 96 - 112 mEq/L   CO2 28 19 - 32 mEq/L   Glucose, Bld 118 (H) 70 - 99 mg/dL   BUN 14 6 - 23 mg/dL   Creatinine, Ser 0.84 0.40 - 1.20 mg/dL   GFR 67.09 >60.00 mL/min    Comment: Calculated using the CKD-EPI Creatinine Equation (2021)   Calcium 9.2 8.4 - 10.5 mg/dL  CBC with Differential/Platelet     Status: None   Collection Time: 04/03/22 10:44 AM  Result Value Ref Range   WBC 5.2 4.0 - 10.5 K/uL   RBC 4.23 3.87 - 5.11 Mil/uL   Hemoglobin 13.2 12.0 - 15.0 g/dL   HCT 39.7 36.0 - 46.0 %   MCV 93.7 78.0 - 100.0 fl   MCHC 33.2 30.0 - 36.0 g/dL    RDW 14.4 11.5 - 15.5 %   Platelets 248.0 150.0 - 400.0 K/uL   Neutrophils Relative % 45.0 43.0 - 77.0 %   Lymphocytes Relative 43.1 12.0 - 46.0 %   Monocytes Relative 8.6 3.0 - 12.0 %   Eosinophils Relative 2.6 0.0 - 5.0 %   Basophils Relative 0.7 0.0 - 3.0 %   Neutro Abs 2.4 1.4 - 7.7 K/uL   Lymphs Abs 2.3 0.7 - 4.0 K/uL   Monocytes Absolute 0.5 0.1 - 1.0 K/uL   Eosinophils Absolute 0.1 0.0 - 0.7 K/uL   Basophils Absolute 0.0 0.0 - 0.1 K/uL  Hepatic function panel     Status: None   Collection Time: 04/03/22 10:44 AM  Result Value Ref Range   Total Bilirubin 0.5 0.2 - 1.2 mg/dL   Bilirubin, Direct 0.1 0.0 - 0.3 mg/dL   Alkaline Phosphatase 106 39 - 117 U/L   AST 18 0 - 37 U/L   ALT 12 0 - 35 U/L   Total Protein 7.1 6.0 - 8.3 g/dL   Albumin 4.3 3.5 - 5.2 g/dL  Signed: Loralee Pacas, MD 04/05/2022 12:25 PM

## 2022-04-06 ENCOUNTER — Other Ambulatory Visit: Payer: Self-pay | Admitting: Internal Medicine

## 2022-04-23 ENCOUNTER — Ambulatory Visit (INDEPENDENT_AMBULATORY_CARE_PROVIDER_SITE_OTHER): Payer: 59 | Admitting: Podiatry

## 2022-04-23 ENCOUNTER — Encounter: Payer: Self-pay | Admitting: Podiatry

## 2022-04-23 VITALS — BP 164/78

## 2022-04-23 DIAGNOSIS — L84 Corns and callosities: Secondary | ICD-10-CM

## 2022-04-23 DIAGNOSIS — M79674 Pain in right toe(s): Secondary | ICD-10-CM

## 2022-04-23 DIAGNOSIS — M79675 Pain in left toe(s): Secondary | ICD-10-CM | POA: Diagnosis not present

## 2022-04-23 DIAGNOSIS — B351 Tinea unguium: Secondary | ICD-10-CM | POA: Diagnosis not present

## 2022-04-23 DIAGNOSIS — E119 Type 2 diabetes mellitus without complications: Secondary | ICD-10-CM

## 2022-04-23 NOTE — Progress Notes (Signed)
Subjective:  Patient ID: Brandi Shaffer, female    DOB: [birth year],  MRN: [id number]  Brandi Shaffer presents to clinic today for preventative diabetic foot care and callus(es) both feet and painful thick toenails that are difficult to trim. Painful toenails interfere with ambulation. Aggravating factors include wearing enclosed shoe gear. Pain is relieved with periodic professional debridement. Painful calluses are aggravated when weightbearing with and without shoegear. Pain is relieved with periodic professional debridement.  Chief Complaint  Patient presents with   Nail Problem    DFC BS-did not check today A1C-5.1 PCP-[facility] P[facility] VST-3 months ago   New problem(s): None.   PCP is Corwin Levins, MD.  Allergies  Allergen Reactions   Januvia [Sitagliptin Phosphate]     dizziness   Lipitor [Atorvastatin] Other (See Comments)    Myalgia    Metformin And Related Nausea Only   Doxycycline Rash    Review of Systems: Negative except as noted in the HPI.  Objective:  Vitals:   04/23/22 0940  BP: (!) 164/78   Brandi Shaffer is a pleasant 77 y.o. female in NAD. AAO x 3.  Vascular Examination: Vascular status intact b/l with palpable pedal pulses. Pedal hair present b/l. CFT immediate b/l. No edema. No pain with calf compression b/l. Skin temperature gradient WNL b/l. No cyanosis or clubbing noted b/l LE.  Neurological Examination: Sensation grossly intact b/l with 10 gram monofilament. Vibratory sensation intact b/l.   Dermatological Examination: Pedal skin with normal turgor, texture and tone b/l. Toenails right great toe and 2-5 b/l thick, discolored, elongated with subungual debris and pain on dorsal palpation.   Hyperkeratotic lesion(s) submet head 2 left foot, submet head 3 b/l and submet head 4 left foot.  No erythema, no edema, no drainage, no fluctuance.   Musculoskeletal Examination: Muscle strength 5/5 to b/l LE. Limited joint ROM to the left midfoot and left  ankle.  Assessment/Plan: 1. Pain due to onychomycosis of toenails of both feet   2. Callus   3. Controlled type 2 diabetes mellitus without complication, without long-term current use of insulin (HCC)     -Patient was evaluated and treated. All patient's and/or POA's questions/concerns answered on today's visit. -Toenails 1-5 b/l were debrided in length and girth with sterile nail nippers and dremel without iatrogenic bleeding.  -Callus(es) submet head 2 left foot, submet head 3 b/l, and submet head 4 left foot pared utilizing sterile scalpel blade without complication or incident. Total number debrided =4. -Patient/POA to call should there be question/concern in the interim.   Return in about 3 months (around 07/22/2022).  [name], DPM

## 2022-05-22 ENCOUNTER — Other Ambulatory Visit: Payer: Self-pay | Admitting: Internal Medicine

## 2022-05-24 ENCOUNTER — Encounter (INDEPENDENT_AMBULATORY_CARE_PROVIDER_SITE_OTHER): Payer: 59 | Admitting: Ophthalmology

## 2022-06-25 ENCOUNTER — Encounter: Payer: Self-pay | Admitting: Internal Medicine

## 2022-06-25 ENCOUNTER — Ambulatory Visit (INDEPENDENT_AMBULATORY_CARE_PROVIDER_SITE_OTHER): Payer: 59 | Admitting: Internal Medicine

## 2022-06-25 VITALS — BP 124/78 | HR 60 | Temp 97.9°F | Ht 59.0 in | Wt 186.0 lb

## 2022-06-25 DIAGNOSIS — R229 Localized swelling, mass and lump, unspecified: Secondary | ICD-10-CM

## 2022-06-25 DIAGNOSIS — I10 Essential (primary) hypertension: Secondary | ICD-10-CM

## 2022-06-25 DIAGNOSIS — E1165 Type 2 diabetes mellitus with hyperglycemia: Secondary | ICD-10-CM | POA: Diagnosis not present

## 2022-06-25 DIAGNOSIS — E7800 Pure hypercholesterolemia, unspecified: Secondary | ICD-10-CM | POA: Diagnosis not present

## 2022-06-25 DIAGNOSIS — R600 Localized edema: Secondary | ICD-10-CM

## 2022-06-25 DIAGNOSIS — E559 Vitamin D deficiency, unspecified: Secondary | ICD-10-CM

## 2022-06-25 MED ORDER — LOSARTAN POTASSIUM 25 MG PO TABS: 25.0000 mg | ORAL_TABLET | Freq: Every day | ORAL | 3 refills | Status: DC

## 2022-06-25 NOTE — Progress Notes (Unsigned)
Patient ID: Brandi Shaffer, female   DOB: Dec 08, 1945, 77 y.o.   MRN: [id number]        Chief Complaint: follow up leg swelling, HTN, HLD and DM, skin nodule       HPI:  Brandi Shaffer is a 77 y.o. female here overall doing well, Pt denies chest pain, increased sob or doe, wheezing, orthopnea, PND, increased LE swelling, palpitations, dizziness or syncope, in fact has improved leg swelling.now with lasix 20 mg qd prn.    Pt denies polydipsia, polyuria, or new focal neuro s/s.    Pt denies fever, wt loss, night sweats, loss of appetite, or other constitutional symptoms  . Recent echo with normal EF.  Not taking statin every day, but no myalgias.  Pt is interested now in DM education.  Also has a new subq skin nodule that seems tender after palpation to the mid right medial leg.       Wt Readings from Last 3 Encounters:  06/25/22 186 lb (84.4 kg)  04/05/22 182 lb 3.2 oz (82.6 kg)  03/26/22 185 lb (83.9 kg)   BP Readings from Last 3 Encounters:  06/25/22 124/78  04/23/22 (!) 164/78  04/05/22 (!) 165/81         Past Medical History:  Diagnosis Date   ANEMIA-IRON DEFICIENCY 12/06/2009   Anxiety state 07/01/2015   Asthma 12/05/2011   DIABETES MELLITUS, TYPE II 09/17/2006   DIVERTICULOSIS, COLON 12/06/2009   GENITAL HERPES 03/24/2010   GERD (gastroesophageal reflux disease) 06/28/2011   HYPERLIPIDEMIA 03/24/2010   HYPERTENSION 09/17/2006   HYPOTHYROIDISM 09/17/2006   LIPOMA 12/06/2009   PERIPHERAL EDEMA 03/24/2010   Renal cyst 07/05/2011   1.9 cm minimally complex   Past Surgical History:  Procedure Laterality Date   ABDOMINAL HYSTERECTOMY     fibroids   ankle surgury     x 3 left - chronic pain/swelling   COLONOSCOPY  2011   SHOULDER SURGERY Left    TONSILLECTOMY     TUBAL LIGATION      reports that she has never smoked. She has never used smokeless tobacco. She reports that she does not drink alcohol and does not use drugs. family history includes Breast cancer (age of onset: 89) in her  sister; Diabetes in an other family member; Heart disease in her sister; Hypertension in her father and mother; Joint hypermobility in an other family member; Prostate cancer in her brother; Stroke in her sister. Allergies  Allergen Reactions   Januvia [Sitagliptin Phosphate]     dizziness   Lipitor [Atorvastatin] Other (See Comments)    Myalgia    Metformin And Related Nausea Only   Doxycycline Rash   Current Outpatient Medications on File Prior to Visit  Medication Sig Dispense Refill   albuterol (VENTOLIN HFA) 108 (90 Base) MCG/ACT inhaler INHALE 2 PUFFS BY MOUTH EVERY 6 HOURS AS NEEDED FOR WHEEZE 8.5 each 3   amLODipine (NORVASC) 10 MG tablet TAKE 1 TABLET BY MOUTH EVERY DAY 90 tablet 3   ammonium lactate (LAC-HYDRIN) 12 % lotion APPLY 1 APPLICATION TOPICALLY AS NEEDED FOR DRY SKIN. 400 mL 5   aspirin 81 MG EC tablet TAKE 1 TABLET BY MOUTH DAILY. SWALLOW WHOLE. 30 tablet 11   chlorhexidine (PERIDEX) 0.12 % solution Use as directed 15 mLs in the mouth or throat 2 (two) times daily. 473 mL 11   Cholecalciferol 50 MCG (2000 UT) TABS 1 tab by mouth once daily 30 tablet 99   clotrimazole (LOTRIMIN) 1 % cream  Apply to affected feet and between toes twice daily for 6 weeks for athlete's feet 60 g 1   cyclobenzaprine (FLEXERIL) 5 MG tablet Take 1 tablet (5 mg total) by mouth 3 (three) times daily as needed for muscle spasms. 30 tablet 1   fluconazole (DIFLUCAN) 150 MG tablet Take 1 tablet (150 mg total) by mouth every three (3) days as needed. 2 tablet 1   furosemide (LASIX) 20 MG tablet Take 1 tablet (20 mg total) by mouth daily as needed. 90 tablet 3   Multiple Vitamins-Minerals (WOMENS MULTIVITAMIN PO) Take 1 tablet by mouth daily.     OneTouch Delica Lancets 33G MISC 1 each by Does not apply route 2 (two) times daily. Use to check blood sugars twice a day Dx E11.9 200 each 11   ONETOUCH VERIO test strip USE 2 (TWO) TIMES DAILY. DX E11.9 100 strip 4   pioglitazone (ACTOS) 15 MG tablet TAKE 1  TABLET BY MOUTH EVERY DAY 90 tablet 2   potassium chloride (KLOR-CON) 10 MEQ tablet TAKE 1 TABLET BY MOUTH EVERY DAY 90 tablet 3   rosuvastatin (CRESTOR) 20 MG tablet Take 1 tablet (20 mg total) by mouth daily. 90 tablet 3   vitamin B-12 (CYANOCOBALAMIN) 1000 MCG tablet 1 tab by mouth mon - wed - thur 90 tablet 3   amoxicillin-clavulanate (AUGMENTIN) 875-125 MG tablet Take 1 tablet by mouth 2 (two) times daily. (Patient not taking: Reported on 06/25/2022) 20 tablet 0   No current facility-administered medications on file prior to visit.        ROS:  All others reviewed and negative.  Objective        PE:  BP 124/78 (BP Location: Right Arm, Patient Position: Sitting, Cuff Size: Normal)   Pulse 60   Temp 97.9 F (36.6 C) (Oral)   Ht 4\' 11"  (1.499 m)   Wt 186 lb (84.4 kg)   SpO2 100%   BMI 37.57 kg/m                 Constitutional: Pt appears in NAD               HENT: Head: NCAT.                Right Ear: External ear normal.                 Left Ear: External ear normal.                Eyes: . Pupils are equal, round, and reactive to light. Conjunctivae and EOM are normal               Nose: without d/c or deformity               Neck: Neck supple. Gross normal ROM               Cardiovascular: Normal rate and regular rhythm.                 Pulmonary/Chest: Effort normal and breath sounds without rales or wheezing.                Abd:  Soft, NT, ND, + BS, no organomegaly               Neurological: Pt is alert. At baseline orientation, motor grossly intact               Skin: Skin is warm, has subq nodules x 3  approx 0.5 to 1 cm to right medial leg ,  LE edema - none               Psychiatric: Pt behavior is normal without agitation   Micro: none  Cardiac tracings I have personally interpreted today:  none  Pertinent Radiological findings (summarize): none   Lab Results  Component Value Date   WBC 5.2 04/03/2022   HGB 13.2 04/03/2022   HCT 39.7 04/03/2022   PLT 248.0  04/03/2022   GLUCOSE 118 (H) 04/03/2022   CHOL 182 01/11/2022   TRIG 72.0 01/11/2022   HDL 56.60 01/11/2022   LDLDIRECT 134.6 05/23/2010   LDLCALC 111 (H) 01/11/2022   ALT 12 04/03/2022   AST 18 04/03/2022   NA 143 04/03/2022   K 3.8 04/03/2022   CL 106 04/03/2022   CREATININE 0.84 04/03/2022   BUN 14 04/03/2022   CO2 28 04/03/2022   TSH 1.94 01/11/2022   HGBA1C 6.0 01/11/2022   MICROALBUR 1.1 01/11/2022   Assessment/Plan:  VINCENZINA GRAW is a 77 y.o. Black or African American [2] female with  has a past medical history of ANEMIA-IRON DEFICIENCY (12/06/2009), Anxiety state (07/01/2015), Asthma (12/05/2011), DIABETES MELLITUS, TYPE II (09/17/2006), DIVERTICULOSIS, COLON (12/06/2009), GENITAL HERPES (03/24/2010), GERD (gastroesophageal reflux disease) (06/28/2011), HYPERLIPIDEMIA (03/24/2010), HYPERTENSION (09/17/2006), HYPOTHYROIDISM (09/17/2006), LIPOMA (12/06/2009), PERIPHERAL EDEMA (03/24/2010), and Renal cyst (07/05/2011).  Peripheral edema Much improved with lasix 20 mg qd prn, recent echo with normal EF and stable mild AS, ok to continue same tx  Diabetes Lab Results  Component Value Date   HGBA1C 6.0 01/11/2022   Stable, pt to continue current medical treatment - actos 15 mg qd, also for DM education referral   Essential hypertension BP Readings from Last 3 Encounters:  06/25/22 124/78  04/23/22 (!) 164/78  04/05/22 (!) 165/81   Stable, pt to continue medical treatment norvasc 10 qd, losartan 25 qd   Hyperlipidemia Lab Results  Component Value Date   LDLCALC 111 (H) 01/11/2022   Uncontrolled,, pt to continue current statin crestor 20 qd with good compliance   Vitamin D deficiency Last vitamin D Lab Results  Component Value Date   VD25OH 33.64 01/11/2022   Low, to start oral replacement   Multiple skin nodules right leg medial subq nodules appear benign, I suggested referral to general surgury for for further consideration, but declines for now, will let us know if  wants this if nodules change / enlarge  Followup: Return in about 6 months (around 12/25/2022).  Oliver Barre, MD 06/28/2022 1:10 PM [facility]

## 2022-06-25 NOTE — Patient Instructions (Signed)
Please continue all other medications as before, and refills have been done if requested.  Please have the pharmacy call with any other refills you may need.  Please continue your efforts at being more active, low cholesterol diet, and weight control  Please keep your appointments with your specialists as you may have planned  You will be contacted regarding the referral for: Diabetes education class  Please make an Appointment to return in 6 months, or sooner if needed

## 2022-06-28 ENCOUNTER — Encounter: Payer: Self-pay | Admitting: Internal Medicine

## 2022-06-28 DIAGNOSIS — E119 Type 2 diabetes mellitus without complications: Secondary | ICD-10-CM | POA: Diagnosis not present

## 2022-06-28 DIAGNOSIS — R229 Localized swelling, mass and lump, unspecified: Secondary | ICD-10-CM | POA: Insufficient documentation

## 2022-06-28 NOTE — Assessment & Plan Note (Signed)
Lab Results  Component Value Date   LDLCALC 111 (H) 01/11/2022   Uncontrolled,, pt to continue current statin crestor 20 qd with good compliance

## 2022-06-28 NOTE — Assessment & Plan Note (Signed)
right leg medial subq nodules appear benign, I suggested referral to general surgury for for further consideration, but declines for now, will let us know if wants this if nodules change / enlarge

## 2022-06-28 NOTE — Assessment & Plan Note (Signed)
BP Readings from Last 3 Encounters:  06/25/22 124/78  04/23/22 (!) 164/78  04/05/22 (!) 165/81   Stable, pt to continue medical treatment norvasc 10 qd, losartan 25 qd

## 2022-06-28 NOTE — Assessment & Plan Note (Signed)
Last vitamin D Lab Results  Component Value Date   VD25OH 33.64 01/11/2022   Low, to start  oral replacement  

## 2022-06-28 NOTE — Assessment & Plan Note (Signed)
Lab Results  Component Value Date   HGBA1C 6.0 01/11/2022   Stable, pt to continue current medical treatment - actos 15 mg qd, also for DM education referral

## 2022-06-28 NOTE — Assessment & Plan Note (Signed)
Much improved with lasix 20 mg qd prn, recent echo with normal EF and stable mild AS, ok to continue same tx

## 2022-07-03 LAB — HM DIABETES EYE EXAM

## 2022-07-04 ENCOUNTER — Other Ambulatory Visit: Payer: Self-pay | Admitting: Internal Medicine

## 2022-07-06 ENCOUNTER — Other Ambulatory Visit: Payer: Self-pay

## 2022-07-13 ENCOUNTER — Ambulatory Visit: Payer: Medicare Other | Admitting: Internal Medicine

## 2022-08-01 NOTE — Progress Notes (Unsigned)
Brandi Shaffer [facility] [address] [place] [address] Phone: [phone]   Assessment and Plan:     There are no diagnoses linked to this encounter.  ***   Pertinent previous records reviewed include ***   Follow Up: ***     Subjective:   I, Brandi Shaffer, am serving as a Neurosurgeon for Doctor Brandi Shaffer  Chief Complaint: left ankle pain   HPI:  Update 11/29/2020 Brandi Shaffer is a 77 y.o. female coming in with complaint of R hip and L ankle pain. Injected in June 2022.  This was for more of a greater trochanteric bursitis. States that when she wears flat shoes or is barefoot she gets a shooting pain down the anterior side of her right thigh.   Patient did have x-rays taken of the lumbar spine showing the patient does have facet arthropathy noted.  These were independently visualized by me again today. Patient is also had x-rays of the ankle done in January 2020 showing the patient did have posttraumatic arthritic changes noted.  08/02/2022 Patient states   Relevant Historical Information: ***  Additional pertinent review of systems negative.   Current Outpatient Medications:    albuterol (VENTOLIN HFA) 108 (90 Base) MCG/ACT inhaler, INHALE 2 PUFFS BY MOUTH EVERY 6 HOURS AS NEEDED FOR WHEEZE, Disp: 8.5 each, Rfl: 3   amLODipine (NORVASC) 10 MG tablet, TAKE 1 TABLET BY MOUTH EVERY DAY, Disp: 90 tablet, Rfl: 3   ammonium lactate (LAC-HYDRIN) 12 % lotion, APPLY 1 APPLICATION TOPICALLY AS NEEDED FOR DRY SKIN., Disp: 400 mL, Rfl: 5   amoxicillin-clavulanate (AUGMENTIN) 875-125 MG tablet, Take 1 tablet by mouth 2 (two) times daily. (Patient not taking: Reported on 06/25/2022), Disp: 20 tablet, Rfl: 0   aspirin 81 MG EC tablet, TAKE 1 TABLET BY MOUTH DAILY. SWALLOW WHOLE., Disp: 30 tablet, Rfl: 11   chlorhexidine (PERIDEX) 0.12 % solution, Use as directed 15 mLs in the mouth or throat 2 (two) times daily., Disp: 473 mL, Rfl: 11    Cholecalciferol 50 MCG (2000 UT) TABS, 1 tab by mouth once daily, Disp: 30 tablet, Rfl: 99   clotrimazole (LOTRIMIN) 1 % cream, Apply to affected feet and between toes twice daily for 6 weeks for athlete's feet, Disp: 60 g, Rfl: 1   cyclobenzaprine (FLEXERIL) 5 MG tablet, Take 1 tablet (5 mg total) by mouth 3 (three) times daily as needed for muscle spasms., Disp: 30 tablet, Rfl: 1   fluconazole (DIFLUCAN) 150 MG tablet, Take 1 tablet (150 mg total) by mouth every three (3) days as needed., Disp: 2 tablet, Rfl: 1   furosemide (LASIX) 20 MG tablet, Take 1 tablet (20 mg total) by mouth daily as needed., Disp: 90 tablet, Rfl: 3   losartan (COZAAR) 25 MG tablet, Take 1 tablet (25 mg total) by mouth daily., Disp: 90 tablet, Rfl: 3   Multiple Vitamins-Minerals (WOMENS MULTIVITAMIN PO), Take 1 tablet by mouth daily., Disp: , Rfl:    OneTouch Delica Lancets 33G MISC, 1 each by Does not apply route 2 (two) times daily. Use to check blood sugars twice a day Dx E11.9, Disp: 200 each, Rfl: 11   ONETOUCH VERIO test strip, USE 2 (TWO) TIMES DAILY. DX E11.9, Disp: 100 strip, Rfl: 4   pioglitazone (ACTOS) 15 MG tablet, TAKE 1 TABLET BY MOUTH EVERY DAY, Disp: 90 tablet, Rfl: 2   potassium chloride (KLOR-CON) 10 MEQ tablet, TAKE 1 TABLET BY MOUTH EVERY DAY, Disp: 90  tablet, Rfl: 3   rosuvastatin (CRESTOR) 20 MG tablet, Take 1 tablet (20 mg total) by mouth daily., Disp: 90 tablet, Rfl: 3   vitamin B-12 (CYANOCOBALAMIN) 1000 MCG tablet, 1 tab by mouth mon - wed - thur, Disp: 90 tablet, Rfl: 3   Objective:     There were no vitals filed for this visit.    There is no height or weight on file to calculate BMI.    Physical Exam:    ***   Electronically signed by:  Brandi Shaffer [facility] 7:22 AM 08/01/22

## 2022-08-02 ENCOUNTER — Ambulatory Visit (INDEPENDENT_AMBULATORY_CARE_PROVIDER_SITE_OTHER): Payer: 59

## 2022-08-02 ENCOUNTER — Other Ambulatory Visit: Payer: Self-pay

## 2022-08-02 ENCOUNTER — Ambulatory Visit (INDEPENDENT_AMBULATORY_CARE_PROVIDER_SITE_OTHER): Payer: 59 | Admitting: Sports Medicine

## 2022-08-02 VITALS — HR 91 | Ht 59.0 in | Wt 189.0 lb

## 2022-08-02 DIAGNOSIS — M19172 Post-traumatic osteoarthritis, left ankle and foot: Secondary | ICD-10-CM

## 2022-08-02 DIAGNOSIS — M25572 Pain in left ankle and joints of left foot: Secondary | ICD-10-CM | POA: Diagnosis not present

## 2022-08-02 DIAGNOSIS — G8929 Other chronic pain: Secondary | ICD-10-CM | POA: Diagnosis not present

## 2022-08-02 DIAGNOSIS — Z4789 Encounter for other orthopedic aftercare: Secondary | ICD-10-CM | POA: Diagnosis not present

## 2022-08-02 MED ORDER — MELOXICAM 15 MG PO TABS: 15.0000 mg | ORAL_TABLET | Freq: Every day | ORAL | 0 refills | Status: DC

## 2022-08-02 NOTE — Patient Instructions (Signed)
-   Start meloxicam 15 mg daily x2 weeks.  If still having pain after 2 weeks, complete 3rd-week of meloxicam. May use remaining meloxicam as needed once daily for pain control.  Do not to use additional NSAIDs while taking meloxicam.  May use Tylenol [phone] mg 2 to 3 times a day for breakthrough pain. Ankle HEP  Recommend getting a compression stocking to wear  PT referral  4 week follow up

## 2022-08-09 ENCOUNTER — Other Ambulatory Visit: Payer: Self-pay

## 2022-08-09 ENCOUNTER — Ambulatory Visit: Payer: 59 | Attending: Sports Medicine

## 2022-08-09 DIAGNOSIS — R262 Difficulty in walking, not elsewhere classified: Secondary | ICD-10-CM | POA: Diagnosis not present

## 2022-08-09 DIAGNOSIS — M25672 Stiffness of left ankle, not elsewhere classified: Secondary | ICD-10-CM | POA: Insufficient documentation

## 2022-08-09 DIAGNOSIS — R252 Cramp and spasm: Secondary | ICD-10-CM | POA: Diagnosis not present

## 2022-08-09 DIAGNOSIS — M19172 Post-traumatic osteoarthritis, left ankle and foot: Secondary | ICD-10-CM | POA: Diagnosis not present

## 2022-08-09 DIAGNOSIS — M6281 Muscle weakness (generalized): Secondary | ICD-10-CM | POA: Insufficient documentation

## 2022-08-09 DIAGNOSIS — G8929 Other chronic pain: Secondary | ICD-10-CM | POA: Diagnosis not present

## 2022-08-09 DIAGNOSIS — M25572 Pain in left ankle and joints of left foot: Secondary | ICD-10-CM | POA: Diagnosis not present

## 2022-08-09 NOTE — Therapy (Addendum)
OUTPATIENT PHYSICAL THERAPY LOWER EXTREMITY EVALUATION   Patient Name: Brandi Shaffer MRN: [id number] DOB:May 16, 1945, 77 y.o., female Today's Date: 08/09/2022  END OF SESSION:  PT End of Session - 08/09/22 1404     Visit Number 1    Date for PT Re-Evaluation 10/04/22    Authorization Type [employer] MEDICARE [employer] DUAL COMPLETE    PT Start Time 1404    PT Stop Time 1448    PT Time Calculation (min) 44 min    Activity Tolerance Patient tolerated treatment well    Behavior During Therapy [facility] for tasks assessed/performed             Past Medical History:  Diagnosis Date   ANEMIA-IRON DEFICIENCY 12/06/2009   Anxiety state 07/01/2015   Asthma 12/05/2011   DIABETES MELLITUS, TYPE II 09/17/2006   DIVERTICULOSIS, COLON 12/06/2009   GENITAL HERPES 03/24/2010   GERD (gastroesophageal reflux disease) 06/28/2011   HYPERLIPIDEMIA 03/24/2010   HYPERTENSION 09/17/2006   HYPOTHYROIDISM 09/17/2006   LIPOMA 12/06/2009   PERIPHERAL EDEMA 03/24/2010   Renal cyst 07/05/2011   1.9 cm minimally complex   Past Surgical History:  Procedure Laterality Date   ABDOMINAL HYSTERECTOMY     fibroids   ankle surgury     x 3 left - chronic pain/swelling   COLONOSCOPY  2011   SHOULDER SURGERY Left    TONSILLECTOMY     TUBAL LIGATION     Patient Active Problem List   Diagnosis Date Noted   Multiple skin nodules 06/28/2022   Dental infection 04/05/2022   Peripheral edema 03/26/2022   Leg length discrepancy 09/28/2020   Greater trochanteric bursitis, right 08/09/2020   Posterior vitreous detachment of right eye 02/18/2020   Posterior vitreous detachment of left eye 02/18/2020   Diabetes mellitus without complication (HCC) 02/18/2020   Nuclear sclerotic cataract of both eyes 02/18/2020   Urinary frequency 01/11/2020   Pelvic pain 01/11/2020   Vitamin D deficiency 01/11/2020   B12 deficiency 01/11/2020   Superficial phlebitis 09/03/2019   Pain of right thumb 01/06/2019   Sprain of  second toe, left, initial encounter 12/18/2017   Acute upper respiratory infection 01/04/2017   Greater trochanteric bursitis of left hip 12/19/2015   Right otitis media 12/16/2015   Shoulder dislocation 08/03/2015   Anxiety state 07/01/2015   Cellulitis 04/01/2015   Osteoarthritis of left ankle 04/01/2015   Bilateral leg pain 02/15/2015   PVD (peripheral vascular disease) (HCC) 12/22/2014   Cellulitis of right lower leg 12/13/2014   Right leg pain 11/10/2014   Hx of colonic polyps 09/08/2013   Obesity (BMI 30-39.9) 09/08/2013   Impingement syndrome of left shoulder 06/04/2012   Mild aortic stenosis 12/05/2011   Asthma 12/05/2011   Renal cyst 07/05/2011   Low back pain 06/28/2011   GERD (gastroesophageal reflux disease) 06/28/2011   Carpal tunnel syndrome of right wrist 04/22/2011   Right foot pain 05/23/2010   Encounter for well adult exam with abnormal findings 05/23/2010   GENITAL HERPES 03/24/2010   Hyperlipidemia 03/24/2010   Nocturia 03/24/2010   LIPOMA 12/06/2009   ANEMIA-IRON DEFICIENCY 12/06/2009   CONJUNCTIVITIS, BILATERAL 12/06/2009   DIVERTICULOSIS, COLON 12/06/2009   HYPOTHYROIDISM 09/17/2006   Diabetes (HCC) 09/17/2006   Essential hypertension 09/17/2006    PCP: Corwin Levins, MD   REFERRING PROVIDER: Richardean Sale, DO  REFERRING DIAG: [phone] (ICD-10-CM) - Chronic pain of left ankle M19.172 (ICD-10-CM) - Post-traumatic osteoarthritis of left ankle  THERAPY DIAG:  Pain in left ankle and joints of  left foot - Plan: PT plan of care cert/re-cert  Stiffness of left ankle, not elsewhere classified - Plan: PT plan of care cert/re-cert  Muscle weakness (generalized) - Plan: PT plan of care cert/re-cert  Cramp and spasm - Plan: PT plan of care cert/re-cert  Difficulty in walking, not elsewhere classified - Plan: PT plan of care cert/re-cert  Rationale for Evaluation and Treatment: Rehabilitation  ONSET DATE: 08/02/2022  SUBJECTIVE:    SUBJECTIVE STATEMENT: Patient had ankle fracture with ORIF approx 5 years ago.  She had 3 surgeries on the ankle, one due to falling when using the crutches during recovery for the first surgery. She is retired but is very active.  She has several grandkids and one grandchild with MS who is blind who she helps care for and take to MD appts.  She enjoys gardening and going to watch their sporting events.  "I don't sit still.  I like to go."  The one think I don't do is stairs.  She does admit some pain in the right hip and low back from limping when she hurts.    PERTINENT HISTORY: ORIF left ankle 5 years ago PAIN:  Are you having pain? Yes: NPRS scale: 0/10 but can get up to 8/10 Pain location: left ankle, medial side Pain description: aching  Aggravating factors: walking, standing Relieving factors: good shoes and rest  PRECAUTIONS: None  WEIGHT BEARING RESTRICTIONS: No  FALLS:  Has patient fallen in last 6 months? No  LIVING ENVIRONMENT: Lives with: lives alone Lives in: House/apartment Stairs: Yes: External: 3 steps; on right going up Has following equipment at home: Single point cane  OCCUPATION: retired  PLOF: Independent, Independent with household mobility without device, Independent with community mobility without device, Independent with homemaking with ambulation, Independent with gait, and Independent with transfers  PATIENT GOALS: To keep my left ankle from flaring up and giving me trouble  NEXT MD VISIT: prn  OBJECTIVE:   DIAGNOSTIC FINDINGS: na  PATIENT SURVEYS:  LEFS: 12/80  COGNITION: Overall cognitive status: Within functional limits for tasks assessed     SENSATION: WFL   POSTURE:  slightly fwd bent with min knee extension and short step length  PALPATION: Tender at peroneal tendon area  LOWER EXTREMITY ROM:  Active ROM Right eval Left eval  Ankle dorsiflexion  Neutral (0)  Ankle plantarflexion  20  Ankle inversion  20  Ankle eversion   5   (Blank rows = not tested)  LOWER EXTREMITY MMT:  MMT Right eval Left eval  Ankle dorsiflexion  5  Ankle plantarflexion  5-  Ankle inversion  4  Ankle eversion  3+   (Blank rows = not tested)  LOWER EXTREMITY SPECIAL TESTS:  NA  FUNCTIONAL TESTS:  5 times sit to stand: 14.86 sec Timed up and go (TUG): 10.93 sec  GAIT: Distance walked: 30 Assistive device utilized: Single point cane and None Level of assistance: Modified independence Comments: Typically does not use any a.d but just bought a cane.  I have it in case I need it.     TODAY'S TREATMENT:  DATE: 08/09/22 Initiated HEP, Initial eval completed    PATIENT EDUCATION:  Education details: Initiated HEP Person educated: Patient Education method: Programmer, multimedia, Facilities manager, Verbal cues, and Handouts Education comprehension: verbalized understanding and returned demonstration  HOME EXERCISE PROGRAM: Access Code: [email or web address] URL: [email or web address] Date: 08/09/2022 Prepared by: Mikey Kirschner  Exercises - Seated Ankle Alphabet  - 1 x daily - 7 x weekly - 3 sets - 10 reps - Long Sitting Calf Stretch with Strap  - 1 x daily - 7 x weekly - 3 sets - 10 reps - Seated Ankle Inversion Eversion PROM  - 1 x daily - 7 x weekly - 3 sets - 10 reps - Seated Heel Raise  - 1 x daily - 7 x weekly - 3 sets - 10 reps - Seated Toe Raise  - 1 x daily - 7 x weekly - 3 sets - 10 reps  ASSESSMENT:  CLINICAL IMPRESSION: Patient is a 77 y.o. female who was seen today for physical therapy evaluation and treatment for left ankle pain and stiffness.   OBJECTIVE IMPAIRMENTS: Abnormal gait, decreased balance, decreased mobility, difficulty walking, decreased ROM, decreased strength, hypomobility, increased edema, increased fascial restrictions, increased muscle spasms, impaired flexibility, and pain.    ACTIVITY LIMITATIONS: carrying, lifting, bending, standing, squatting, stairs, transfers, dressing, and locomotion level  PARTICIPATION LIMITATIONS: meal prep, cleaning, laundry, driving, shopping, community activity, yard work, and church  PERSONAL FACTORS: Fitness and 1-2 comorbidities: Diabetes, htn  are also affecting patient's functional outcome.   REHAB POTENTIAL: Good  CLINICAL DECISION MAKING: Evolving/moderate complexity  EVALUATION COMPLEXITY: Moderate   GOALS: Goals reviewed with patient? Yes  SHORT TERM GOALS: Target date: 09/06/2022  Pain report to be no greater than 4/10  Baseline: Goal status: INITIAL  2.  Patient will be independent with initial HEP  Baseline:  Goal status: INITIAL   LONG TERM GOALS: Target date: 10/04/2022   Patient to report pain no greater than 2/10  Baseline:  Goal status: INITIAL  2.  Patient to be independent with advanced HEP  Baseline:  Goal status: INITIAL  3.  Patient to improve by 2-3 seconds on 5 times sit to stand and TUG Baseline:  Goal status: INITIAL  4.  Ankle DF to improve to 10-15 degrees Baseline:  Goal status: INITIAL  5.  Patient to be able to ascend and descend steps with reciprocal gait Baseline:  Goal status: INITIAL  6.  LEFS score to improve by 2 points Baseline:  Goal status: INITIAL   PLAN:  PT FREQUENCY: 1-2x/week  PT DURATION: 8 weeks  PLANNED INTERVENTIONS: Therapeutic exercises, Therapeutic activity, Neuromuscular re-education, Balance training, Gait training, Patient/Family education, Self Care, Joint mobilization, Stair training, Orthotic/Fit training, DME instructions, Aquatic Therapy, Dry Needling, Electrical stimulation, Cryotherapy, Moist heat, Compression bandaging, scar mobilization, Splintting, Taping, Vasopneumatic device, Ultrasound, Ionotophoresis 4mg /ml Dexamethasone, Manual therapy, and Re-evaluation  PLAN FOR NEXT SESSION: Review HEP, begin ankle stability training,  Nila Nephew, PT 08/09/2022, 3:31 PM

## 2022-08-14 ENCOUNTER — Ambulatory Visit (INDEPENDENT_AMBULATORY_CARE_PROVIDER_SITE_OTHER): Payer: 59 | Admitting: Podiatry

## 2022-08-14 VITALS — BP 128/71

## 2022-08-14 DIAGNOSIS — B351 Tinea unguium: Secondary | ICD-10-CM

## 2022-08-14 DIAGNOSIS — M79674 Pain in right toe(s): Secondary | ICD-10-CM

## 2022-08-14 DIAGNOSIS — M79675 Pain in left toe(s): Secondary | ICD-10-CM

## 2022-08-14 DIAGNOSIS — L84 Corns and callosities: Secondary | ICD-10-CM

## 2022-08-14 DIAGNOSIS — E119 Type 2 diabetes mellitus without complications: Secondary | ICD-10-CM

## 2022-08-19 ENCOUNTER — Encounter: Payer: Self-pay | Admitting: Podiatry

## 2022-08-19 NOTE — Progress Notes (Signed)
Subjective:  Patient ID: Brandi Shaffer, female    DOB: 29-May-1945,  MRN: [id number]  Brandi Shaffer presents to clinic today for: preventative diabetic foot care and callus(es) b/l lower extremities and painful thick toenails that are difficult to trim. Painful toenails interfere with ambulation. Aggravating factors include wearing enclosed shoe gear. Pain is relieved with periodic professional debridement. Painful calluses are aggravated when weightbearing with and without shoegear. Pain is relieved with periodic professional debridement.  Chief Complaint  Patient presents with   Diabetes    [facility] BS - 195 A1C - 5.1 LVPCP - 06/2022    PCP is Corwin Levins, MD.  Allergies  Allergen Reactions   Januvia [Sitagliptin Phosphate]     dizziness   Lipitor [Atorvastatin] Other (See Comments)    Myalgia    Metformin And Related Nausea Only   Doxycycline Rash    Review of Systems: Negative except as noted in the HPI.  Objective: No changes noted in today's physical examination. Vitals:   08/14/22 0905  BP: 128/71    Brandi Shaffer is a pleasant 77 y.o. female in NAD. AAO x 3.  Vascular Examination: Capillary refill time <3 seconds b/l LE. Palpable pedal pulses b/l LE. Digital hair present b/l. No pedal edema b/l. Skin temperature gradient WNL b/l. No varicosities b/l. No ischemia or gangrene noted b/l LE. No cyanosis or clubbing noted b/l LE.Marland Kitchen  Dermatological Examination: Pedal skin with normal turgor, texture and tone b/l. No open wounds. No interdigital macerations b/l. Toenails 1-5 b/l thickened, discolored, dystrophic with subungual debris. There is pain on palpation to dorsal aspect of nailplates. Hyperkeratotic lesion(s) submet head 3 left foot, submet head 4 left foot, and submet head 5 left foot.  No erythema, no edema, no drainage, no fluctuance..  Neurological Examination: Protective sensation intact with 10 gram monofilament b/l LE. Vibratory sensation intact b/l LE.    Musculoskeletal Examination: Muscle strength 5/5 to all lower extremity muscle groups bilaterally. Limited joint ROM to the left foot and left ankle.     Latest Ref Rng & Units 01/11/2022    8:54 AM  Hemoglobin A1C  Hemoglobin-A1c 4.6 - 6.5 % 6.0    Assessment/Plan: 1. Pain due to onychomycosis of toenails of both feet   2. Callus   3. Controlled type 2 diabetes mellitus without complication, without long-term current use of insulin (HCC)     -Consent given for treatment as described below: -Examined patient. -Continue foot and shoe inspections daily. Monitor blood glucose per PCP/Endocrinologist's recommendations. -Patient to continue soft, supportive shoe gear daily. -Mycotic toenails 1-5 bilaterally were debrided in length and girth with sterile nail nippers and dremel without incident. -Callus(es) submet head 3 left foot, submet head 4 left foot, and submet head 5 left foot pared utilizing sharp debridement with sterile blade without complication or incident. Total number debrided =3. -Patient/POA to call should there be question/concern in the interim.   Return in about 3 months (around 11/14/2022).  [name], DPM

## 2022-08-20 ENCOUNTER — Ambulatory Visit: Payer: 59 | Admitting: Physical Therapy

## 2022-08-20 ENCOUNTER — Encounter: Payer: Self-pay | Admitting: Physical Therapy

## 2022-08-20 DIAGNOSIS — M19172 Post-traumatic osteoarthritis, left ankle and foot: Secondary | ICD-10-CM | POA: Diagnosis not present

## 2022-08-20 DIAGNOSIS — M25672 Stiffness of left ankle, not elsewhere classified: Secondary | ICD-10-CM

## 2022-08-20 DIAGNOSIS — M6281 Muscle weakness (generalized): Secondary | ICD-10-CM

## 2022-08-20 DIAGNOSIS — M25572 Pain in left ankle and joints of left foot: Secondary | ICD-10-CM | POA: Diagnosis not present

## 2022-08-20 DIAGNOSIS — R262 Difficulty in walking, not elsewhere classified: Secondary | ICD-10-CM | POA: Diagnosis not present

## 2022-08-20 DIAGNOSIS — R252 Cramp and spasm: Secondary | ICD-10-CM | POA: Diagnosis not present

## 2022-08-20 DIAGNOSIS — G8929 Other chronic pain: Secondary | ICD-10-CM | POA: Diagnosis not present

## 2022-08-20 NOTE — Therapy (Signed)
OUTPATIENT PHYSICAL THERAPY LOWER EXTREMITY TREATMENT   Patient Name: Brandi Shaffer MRN: [id number] DOB:04-16-1945, 77 y.o., female Today's Date: 08/20/2022  END OF SESSION:  PT End of Session - 08/20/22 0843     Visit Number 2    Date for PT Re-Evaluation 10/04/22    Authorization Type [employer] MEDICARE [employer] DUAL COMPLETE    PT Start Time 0845    PT Stop Time 0928    PT Time Calculation (min) 43 min    Activity Tolerance Patient tolerated treatment well    Behavior During Therapy [facility] for tasks assessed/performed              Past Medical History:  Diagnosis Date   ANEMIA-IRON DEFICIENCY 12/06/2009   Anxiety state 07/01/2015   Asthma 12/05/2011   DIABETES MELLITUS, TYPE II 09/17/2006   DIVERTICULOSIS, COLON 12/06/2009   GENITAL HERPES 03/24/2010   GERD (gastroesophageal reflux disease) 06/28/2011   HYPERLIPIDEMIA 03/24/2010   HYPERTENSION 09/17/2006   HYPOTHYROIDISM 09/17/2006   LIPOMA 12/06/2009   PERIPHERAL EDEMA 03/24/2010   Renal cyst 07/05/2011   1.9 cm minimally complex   Past Surgical History:  Procedure Laterality Date   ABDOMINAL HYSTERECTOMY     fibroids   ankle surgury     x 3 left - chronic pain/swelling   COLONOSCOPY  2011   SHOULDER SURGERY Left    TONSILLECTOMY     TUBAL LIGATION     Patient Active Problem List   Diagnosis Date Noted   Multiple skin nodules 06/28/2022   Dental infection 04/05/2022   Peripheral edema 03/26/2022   Leg length discrepancy 09/28/2020   Greater trochanteric bursitis, right 08/09/2020   Posterior vitreous detachment of right eye 02/18/2020   Posterior vitreous detachment of left eye 02/18/2020   Diabetes mellitus without complication (HCC) 02/18/2020   Nuclear sclerotic cataract of both eyes 02/18/2020   Urinary frequency 01/11/2020   Pelvic pain 01/11/2020   Vitamin D deficiency 01/11/2020   B12 deficiency 01/11/2020   Superficial phlebitis 09/03/2019   Pain of right thumb 01/06/2019   Sprain  of second toe, left, initial encounter 12/18/2017   Acute upper respiratory infection 01/04/2017   Greater trochanteric bursitis of left hip 12/19/2015   Right otitis media 12/16/2015   Shoulder dislocation 08/03/2015   Anxiety state 07/01/2015   Cellulitis 04/01/2015   Osteoarthritis of left ankle 04/01/2015   Bilateral leg pain 02/15/2015   PVD (peripheral vascular disease) (HCC) 12/22/2014   Cellulitis of right lower leg 12/13/2014   Right leg pain 11/10/2014   Hx of colonic polyps 09/08/2013   Obesity (BMI 30-39.9) 09/08/2013   Impingement syndrome of left shoulder 06/04/2012   Mild aortic stenosis 12/05/2011   Asthma 12/05/2011   Renal cyst 07/05/2011   Low back pain 06/28/2011   GERD (gastroesophageal reflux disease) 06/28/2011   Carpal tunnel syndrome of right wrist 04/22/2011   Right foot pain 05/23/2010   Encounter for well adult exam with abnormal findings 05/23/2010   GENITAL HERPES 03/24/2010   Hyperlipidemia 03/24/2010   Nocturia 03/24/2010   LIPOMA 12/06/2009   ANEMIA-IRON DEFICIENCY 12/06/2009   CONJUNCTIVITIS, BILATERAL 12/06/2009   DIVERTICULOSIS, COLON 12/06/2009   HYPOTHYROIDISM 09/17/2006   Diabetes (HCC) 09/17/2006   Essential hypertension 09/17/2006    PCP: Corwin Levins, MD   REFERRING PROVIDER: Richardean Sale, DO  REFERRING DIAG: [phone] (ICD-10-CM) - Chronic pain of left ankle M19.172 (ICD-10-CM) - Post-traumatic osteoarthritis of left ankle  THERAPY DIAG:  Pain in left ankle and joints  of left foot  Stiffness of left ankle, not elsewhere classified  Muscle weakness (generalized)  Cramp and spasm  Difficulty in walking, not elsewhere classified  Rationale for Evaluation and Treatment: Rehabilitation  ONSET DATE: 08/02/2022  SUBJECTIVE:   SUBJECTIVE STATEMENT:  Nothing new since eval, my ankle is still swelling and going crazy. Still trying to get used to it, I guess it flares bc I'm never still. Wearing compression  stocking sometimes, don't wear complete flats or heels. I'm always moving and never sitting still.    PERTINENT HISTORY: ORIF left ankle 5 years ago PAIN:  Are you having pain? Yes: NPRS scale: 7/10 Pain location: left ankle, medial side Pain description: aching, "feels like when you twist your wrist"   Aggravating factors: "when I move it the wrong way", unclear  Relieving factors: "green oil", icy hot, "rubbing it down"   PRECAUTIONS: None  WEIGHT BEARING RESTRICTIONS: No  FALLS:  Has patient fallen in last 6 months? No  LIVING ENVIRONMENT: Lives with: lives alone Lives in: House/apartment Stairs: Yes: External: 3 steps; on right going up Has following equipment at home: Single point cane  OCCUPATION: retired  PLOF: Independent, Independent with household mobility without device, Independent with community mobility without device, Independent with homemaking with ambulation, Independent with gait, and Independent with transfers  PATIENT GOALS: To keep my left ankle from flaring up and giving me trouble  NEXT MD VISIT: prn  OBJECTIVE:   DIAGNOSTIC FINDINGS: na  PATIENT SURVEYS:  LEFS: 12/80  COGNITION: Overall cognitive status: Within functional limits for tasks assessed     SENSATION: WFL   POSTURE:  slightly fwd bent with min knee extension and short step length  PALPATION: Tender at peroneal tendon area  LOWER EXTREMITY ROM:  Active ROM Right eval Left eval  Ankle dorsiflexion  Neutral (0)  Ankle plantarflexion  20  Ankle inversion  20  Ankle eversion  5   (Blank rows = not tested)  LOWER EXTREMITY MMT:  MMT Right eval Left eval  Ankle dorsiflexion  5  Ankle plantarflexion  5-  Ankle inversion  4  Ankle eversion  3+   (Blank rows = not tested)  LOWER EXTREMITY SPECIAL TESTS:  NA  FUNCTIONAL TESTS:  5 times sit to stand: 14.86 sec Timed up and go (TUG): 10.93 sec  GAIT: Distance walked: 30 Assistive device utilized: Single point  cane and None Level of assistance: Modified independence Comments: Typically does not use any a.d but just bought a cane.  I have it in case I need it.     TODAY'S TREATMENT:                                                                                                                              DATE:   08/20/22  TherEX  Long sitting gastroc stretches 6x30 seconds (in between reps of manual techniques) Ankle alphabet x3 rounds  4 way ankle red TB x12 each  direction (mod tactile cues for good form) Gastroc stretch on doorway 3x30 seconds  SLS on pink foam pad 3x30 seconds L LE intermittent UE touches on counter  Forward step ups onto pink foam pad L LE leading x12  Tandem stance L foot in back 3x30 seconds    Manual  Forefoot inversion/eversion grade II (tender) Talar distraction/dorsiflexion mobs 4x10 seconds Ankle DF joint mobs grade II 2x30 seconds  Calcaneal eversion mobs grade III 3 rounds     SelfCare  Encouraged consistent use of compression stocking to help manage edema- pitting edema noted BLEs   Relation between ankle ROM/strength and balance    08/09/22 Initiated HEP, Initial eval completed    PATIENT EDUCATION:  Education details: Initiated HEP Person educated: Patient Education method: Programmer, multimedia, Facilities manager, Verbal cues, and Handouts Education comprehension: verbalized understanding and returned demonstration  HOME EXERCISE PROGRAM: Access Code: [email or web address] URL: [email or web address] Date: 08/09/2022 Prepared by: Mikey Kirschner  Exercises - Seated Ankle Alphabet  - 1 x daily - 7 x weekly - 3 sets - 10 reps - Long Sitting Calf Stretch with Strap  - 1 x daily - 7 x weekly - 3 sets - 10 reps - Seated Ankle Inversion Eversion PROM  - 1 x daily - 7 x weekly - 3 sets - 10 reps - Seated Heel Raise  - 1 x daily - 7 x weekly - 3 sets - 10 reps - Seated Toe Raise  - 1 x daily - 7 x weekly - 3 sets - 10 reps  ASSESSMENT:  CLINICAL  IMPRESSION:  Emory arrives today doing OK, very pleasant but very talkative, motivated to improve- would really be able to exercise more. Spent some time on manual techniques to try to improve ankle ROM followed by active exercise within available range. Will continue to progress as able and tolerated.   OBJECTIVE IMPAIRMENTS: Abnormal gait, decreased balance, decreased mobility, difficulty walking, decreased ROM, decreased strength, hypomobility, increased edema, increased fascial restrictions, increased muscle spasms, impaired flexibility, and pain.   ACTIVITY LIMITATIONS: carrying, lifting, bending, standing, squatting, stairs, transfers, dressing, and locomotion level  PARTICIPATION LIMITATIONS: meal prep, cleaning, laundry, driving, shopping, community activity, yard work, and church  PERSONAL FACTORS: Fitness and 1-2 comorbidities: Diabetes, htn  are also affecting patient's functional outcome.   REHAB POTENTIAL: Good  CLINICAL DECISION MAKING: Evolving/moderate complexity  EVALUATION COMPLEXITY: Moderate   GOALS: Goals reviewed with patient? Yes  SHORT TERM GOALS: Target date: 09/06/2022  Pain report to be no greater than 4/10  Baseline: Goal status: INITIAL  2.  Patient will be independent with initial HEP  Baseline:  Goal status: INITIAL   LONG TERM GOALS: Target date: 10/04/2022   Patient to report pain no greater than 2/10  Baseline:  Goal status: INITIAL  2.  Patient to be independent with advanced HEP  Baseline:  Goal status: INITIAL  3.  Patient to improve by 2-3 seconds on 5 times sit to stand and TUG Baseline:  Goal status: INITIAL  4.  Ankle DF to improve to 10-15 degrees Baseline:  Goal status: INITIAL  5.  Patient to be able to ascend and descend steps with reciprocal gait Baseline:  Goal status: INITIAL  6.  LEFS score to improve by 2 points Baseline:  Goal status: INITIAL   PLAN:  PT FREQUENCY: 1-2x/week  PT DURATION: 8  weeks  PLANNED INTERVENTIONS: Therapeutic exercises, Therapeutic activity, Neuromuscular re-education, Balance training, Gait training, Patient/Family education, Self Care, Joint mobilization, Stair training, Orthotic/Fit training, DME  instructions, Aquatic Therapy, Dry Needling, Electrical stimulation, Cryotherapy, Moist heat, Compression bandaging, scar mobilization, Splintting, Taping, Vasopneumatic device, Ultrasound, Ionotophoresis 4mg /ml Dexamethasone, Manual therapy, and Re-evaluation  PLAN FOR NEXT SESSION: Review HEP, begin ankle stability training, Nustep. How did she feel after manual work?   Nedra Hai, PT, DPT 08/20/22 9:29 AM

## 2022-08-24 ENCOUNTER — Encounter: Payer: Self-pay | Admitting: Physical Therapy

## 2022-08-24 ENCOUNTER — Ambulatory Visit: Payer: 59 | Admitting: Physical Therapy

## 2022-08-24 DIAGNOSIS — M6281 Muscle weakness (generalized): Secondary | ICD-10-CM | POA: Diagnosis not present

## 2022-08-24 DIAGNOSIS — G8929 Other chronic pain: Secondary | ICD-10-CM | POA: Diagnosis not present

## 2022-08-24 DIAGNOSIS — R262 Difficulty in walking, not elsewhere classified: Secondary | ICD-10-CM

## 2022-08-24 DIAGNOSIS — R252 Cramp and spasm: Secondary | ICD-10-CM

## 2022-08-24 DIAGNOSIS — M25572 Pain in left ankle and joints of left foot: Secondary | ICD-10-CM | POA: Diagnosis not present

## 2022-08-24 DIAGNOSIS — M25672 Stiffness of left ankle, not elsewhere classified: Secondary | ICD-10-CM | POA: Diagnosis not present

## 2022-08-24 DIAGNOSIS — M19172 Post-traumatic osteoarthritis, left ankle and foot: Secondary | ICD-10-CM | POA: Diagnosis not present

## 2022-08-24 NOTE — Therapy (Signed)
OUTPATIENT PHYSICAL THERAPY LOWER EXTREMITY TREATMENT   Patient Name: TORY LINDENMUTH MRN: [id number] DOB:October 24, 1945, 77 y.o., female Today's Date: 08/24/2022  END OF SESSION:  PT End of Session - 08/24/22 0943     Visit Number 3    Date for PT Re-Evaluation 10/04/22    Authorization Type [employer] MEDICARE Suan Halter DUAL COMPLETE    PT Start Time [phone]    PT Stop Time 1012    PT Time Calculation (min) 39 min    Activity Tolerance Patient tolerated treatment well    Behavior During Therapy [facility] for tasks assessed/performed               Past Medical History:  Diagnosis Date   ANEMIA-IRON DEFICIENCY 12/06/2009   Anxiety state 07/01/2015   Asthma 12/05/2011   DIABETES MELLITUS, TYPE II 09/17/2006   DIVERTICULOSIS, COLON 12/06/2009   GENITAL HERPES 03/24/2010   GERD (gastroesophageal reflux disease) 06/28/2011   HYPERLIPIDEMIA 03/24/2010   HYPERTENSION 09/17/2006   HYPOTHYROIDISM 09/17/2006   LIPOMA 12/06/2009   PERIPHERAL EDEMA 03/24/2010   Renal cyst 07/05/2011   1.9 cm minimally complex   Past Surgical History:  Procedure Laterality Date   ABDOMINAL HYSTERECTOMY     fibroids   ankle surgury     x 3 left - chronic pain/swelling   COLONOSCOPY  2011   SHOULDER SURGERY Left    TONSILLECTOMY     TUBAL LIGATION     Patient Active Problem List   Diagnosis Date Noted   Multiple skin nodules 06/28/2022   Dental infection 04/05/2022   Peripheral edema 03/26/2022   Leg length discrepancy 09/28/2020   Greater trochanteric bursitis, right 08/09/2020   Posterior vitreous detachment of right eye 02/18/2020   Posterior vitreous detachment of left eye 02/18/2020   Diabetes mellitus without complication (HCC) 02/18/2020   Nuclear sclerotic cataract of both eyes 02/18/2020   Urinary frequency 01/11/2020   Pelvic pain 01/11/2020   Vitamin D deficiency 01/11/2020   B12 deficiency 01/11/2020   Superficial phlebitis 09/03/2019   Pain of right thumb 01/06/2019   Sprain  of second toe, left, initial encounter 12/18/2017   Acute upper respiratory infection 01/04/2017   Greater trochanteric bursitis of left hip 12/19/2015   Right otitis media 12/16/2015   Shoulder dislocation 08/03/2015   Anxiety state 07/01/2015   Cellulitis 04/01/2015   Osteoarthritis of left ankle 04/01/2015   Bilateral leg pain 02/15/2015   PVD (peripheral vascular disease) (HCC) 12/22/2014   Cellulitis of right lower leg 12/13/2014   Right leg pain 11/10/2014   Hx of colonic polyps 09/08/2013   Obesity (BMI 30-39.9) 09/08/2013   Impingement syndrome of left shoulder 06/04/2012   Mild aortic stenosis 12/05/2011   Asthma 12/05/2011   Renal cyst 07/05/2011   Low back pain 06/28/2011   GERD (gastroesophageal reflux disease) 06/28/2011   Carpal tunnel syndrome of right wrist 04/22/2011   Right foot pain 05/23/2010   Encounter for well adult exam with abnormal findings 05/23/2010   GENITAL HERPES 03/24/2010   Hyperlipidemia 03/24/2010   Nocturia 03/24/2010   LIPOMA 12/06/2009   ANEMIA-IRON DEFICIENCY 12/06/2009   CONJUNCTIVITIS, BILATERAL 12/06/2009   DIVERTICULOSIS, COLON 12/06/2009   HYPOTHYROIDISM 09/17/2006   Diabetes (HCC) 09/17/2006   Essential hypertension 09/17/2006    PCP: Corwin Levins, MD   REFERRING PROVIDER: Richardean Sale, DO  REFERRING DIAG: [phone] (ICD-10-CM) - Chronic pain of left ankle M19.172 (ICD-10-CM) - Post-traumatic osteoarthritis of left ankle  THERAPY DIAG:  Pain in left ankle and  joints of left foot  Muscle weakness (generalized)  Stiffness of left ankle, not elsewhere classified  Cramp and spasm  Difficulty in walking, not elsewhere classified  Rationale for Evaluation and Treatment: Rehabilitation  ONSET DATE: 08/02/2022  SUBJECTIVE:   SUBJECTIVE STATEMENT:  Its a little sore this morning, you know me I'm always going. Walked my dogs this morning and its not that smooth of a yard so I have to be careful that I don't hit  a hole.    PERTINENT HISTORY: ORIF left ankle 5 years ago PAIN:  Are you having pain? Yes: NPRS scale: 5/10 Pain location: left ankle, medial side and R forefoot Pain description: aching, "feels like when you twist your wrist"   Aggravating factors: "when I move it the wrong way", unclear  Relieving factors: "green oil", icy hot, "rubbing it down"   PRECAUTIONS: None  WEIGHT BEARING RESTRICTIONS: No  FALLS:  Has patient fallen in last 6 months? No  LIVING ENVIRONMENT: Lives with: lives alone Lives in: House/apartment Stairs: Yes: External: 3 steps; on right going up Has following equipment at home: Single point cane  OCCUPATION: retired  PLOF: Independent, Independent with household mobility without device, Independent with community mobility without device, Independent with homemaking with ambulation, Independent with gait, and Independent with transfers  PATIENT GOALS: To keep my left ankle from flaring up and giving me trouble  NEXT MD VISIT: prn  OBJECTIVE:   DIAGNOSTIC FINDINGS: na  PATIENT SURVEYS:  LEFS: 12/80  COGNITION: Overall cognitive status: Within functional limits for tasks assessed     SENSATION: WFL   POSTURE:  slightly fwd bent with min knee extension and short step length  PALPATION: Tender at peroneal tendon area  LOWER EXTREMITY ROM:  Active ROM Right eval Left eval  Ankle dorsiflexion  Neutral (0)  Ankle plantarflexion  20  Ankle inversion  20  Ankle eversion  5   (Blank rows = not tested)  LOWER EXTREMITY MMT:  MMT Right eval Left eval  Ankle dorsiflexion  5  Ankle plantarflexion  5-  Ankle inversion  4  Ankle eversion  3+   (Blank rows = not tested)  LOWER EXTREMITY SPECIAL TESTS:  NA  FUNCTIONAL TESTS:  5 times sit to stand: 14.86 sec Timed up and go (TUG): 10.93 sec  GAIT: Distance walked: 30 Assistive device utilized: Single point cane and None Level of assistance: Modified independence Comments: Typically  does not use any a.d but just bought a cane.  I have it in case I need it.     TODAY'S TREATMENT:                                                                                                                              DATE:   08/24/22  TherEx  Nustep L3 x6 minutes BLEs  Gastroc stretch on door 2x30 seconds B  Plantar fascia stretches off step 2x30 seconds B Ankle PF/DF on rocker board  x20 BLEs Single leg inversion/eversion x10 B on rocker board   NMR  On purple foam pad:  Tandem stance 3x30 seconds B  SLS with one foot on 6 inch step 3x30 seconds B  Narrow BOS 3x60 seconds  Double step tap x6 B    08/20/22  TherEX  Long sitting gastroc stretches 6x30 seconds (in between reps of manual techniques) Ankle alphabet x3 rounds  4 way ankle red TB x12 each direction (mod tactile cues for good form) Gastroc stretch on doorway 3x30 seconds  SLS on pink foam pad 3x30 seconds L LE intermittent UE touches on counter  Forward step ups onto pink foam pad L LE leading x12  Tandem stance L foot in back 3x30 seconds    Manual  Forefoot inversion/eversion grade II (tender) Talar distraction/dorsiflexion mobs 4x10 seconds Ankle DF joint mobs grade II 2x30 seconds  Calcaneal eversion mobs grade III 3 rounds     SelfCare  Encouraged consistent use of compression stocking to help manage edema- pitting edema noted BLEs   Relation between ankle ROM/strength and balance    08/09/22 Initiated HEP, Initial eval completed    PATIENT EDUCATION:  Education details: Initiated HEP Person educated: Patient Education method: Programmer, multimedia, Facilities manager, Verbal cues, and Handouts Education comprehension: verbalized understanding and returned demonstration  HOME EXERCISE PROGRAM: Access Code: [email or web address] URL: [email or web address] Date: 08/09/2022 Prepared by: Mikey Kirschner  Exercises - Seated Ankle Alphabet  - 1 x daily - 7 x weekly - 3 sets - 10 reps - Long  Sitting Calf Stretch with Strap  - 1 x daily - 7 x weekly - 3 sets - 10 reps - Seated Ankle Inversion Eversion PROM  - 1 x daily - 7 x weekly - 3 sets - 10 reps - Seated Heel Raise  - 1 x daily - 7 x weekly - 3 sets - 10 reps - Seated Toe Raise  - 1 x daily - 7 x weekly - 3 sets - 10 reps  ASSESSMENT:  CLINICAL IMPRESSION:  Camy arrives today doing OK, still having pain in her ankle but doing alright overall. Continued work on functional exercise and ankle stability work this session, tolerated well. Will continue to progress as able.   OBJECTIVE IMPAIRMENTS: Abnormal gait, decreased balance, decreased mobility, difficulty walking, decreased ROM, decreased strength, hypomobility, increased edema, increased fascial restrictions, increased muscle spasms, impaired flexibility, and pain.   ACTIVITY LIMITATIONS: carrying, lifting, bending, standing, squatting, stairs, transfers, dressing, and locomotion level  PARTICIPATION LIMITATIONS: meal prep, cleaning, laundry, driving, shopping, community activity, yard work, and church  PERSONAL FACTORS: Fitness and 1-2 comorbidities: Diabetes, htn  are also affecting patient's functional outcome.   REHAB POTENTIAL: Good  CLINICAL DECISION MAKING: Evolving/moderate complexity  EVALUATION COMPLEXITY: Moderate   GOALS: Goals reviewed with patient? Yes  SHORT TERM GOALS: Target date: 09/06/2022  Pain report to be no greater than 4/10  Baseline: Goal status: INITIAL  2.  Patient will be independent with initial HEP  Baseline:  Goal status: INITIAL   LONG TERM GOALS: Target date: 10/04/2022   Patient to report pain no greater than 2/10  Baseline:  Goal status: INITIAL  2.  Patient to be independent with advanced HEP  Baseline:  Goal status: INITIAL  3.  Patient to improve by 2-3 seconds on 5 times sit to stand and TUG Baseline:  Goal status: INITIAL  4.  Ankle DF to improve to 10-15 degrees Baseline:  Goal status: INITIAL  5.   Patient to be  able to ascend and descend steps with reciprocal gait Baseline:  Goal status: INITIAL  6.  LEFS score to improve by 2 points Baseline:  Goal status: INITIAL   PLAN:  PT FREQUENCY: 1-2x/week  PT DURATION: 8 weeks  PLANNED INTERVENTIONS: Therapeutic exercises, Therapeutic activity, Neuromuscular re-education, Balance training, Gait training, Patient/Family education, Self Care, Joint mobilization, Stair training, Orthotic/Fit training, DME instructions, Aquatic Therapy, Dry Needling, Electrical stimulation, Cryotherapy, Moist heat, Compression bandaging, scar mobilization, Splintting, Taping, Vasopneumatic device, Ultrasound, Ionotophoresis 4mg /ml Dexamethasone, Manual therapy, and Re-evaluation  PLAN FOR NEXT SESSION: Review HEP, begin ankle stability training, Nustep.   Nedra Hai, PT, DPT 08/24/22 10:13 AM

## 2022-08-29 ENCOUNTER — Other Ambulatory Visit: Payer: Self-pay | Admitting: Sports Medicine

## 2022-08-29 ENCOUNTER — Ambulatory Visit: Payer: 59 | Attending: Sports Medicine

## 2022-08-29 DIAGNOSIS — R262 Difficulty in walking, not elsewhere classified: Secondary | ICD-10-CM | POA: Insufficient documentation

## 2022-08-29 DIAGNOSIS — M6281 Muscle weakness (generalized): Secondary | ICD-10-CM | POA: Insufficient documentation

## 2022-08-29 DIAGNOSIS — M25572 Pain in left ankle and joints of left foot: Secondary | ICD-10-CM | POA: Diagnosis not present

## 2022-08-29 DIAGNOSIS — R252 Cramp and spasm: Secondary | ICD-10-CM | POA: Diagnosis not present

## 2022-08-29 DIAGNOSIS — M25672 Stiffness of left ankle, not elsewhere classified: Secondary | ICD-10-CM | POA: Diagnosis not present

## 2022-08-29 NOTE — Therapy (Signed)
OUTPATIENT PHYSICAL THERAPY LOWER EXTREMITY TREATMENT   Patient Name: Brandi Shaffer MRN: [id number] DOB:August 24, 1945, 77 y.o., female Today's Date: 08/29/2022  END OF SESSION:  PT End of Session - 08/29/22 0930     Visit Number 4    Date for PT Re-Evaluation 10/04/22    Authorization Type [employer] MEDICARE [employer] DUAL COMPLETE    PT Start Time 0930    PT Stop Time 1015    PT Time Calculation (min) 45 min    Activity Tolerance Patient tolerated treatment well    Behavior During Therapy [facility] for tasks assessed/performed               Past Medical History:  Diagnosis Date   ANEMIA-IRON DEFICIENCY 12/06/2009   Anxiety state 07/01/2015   Asthma 12/05/2011   DIABETES MELLITUS, TYPE II 09/17/2006   DIVERTICULOSIS, COLON 12/06/2009   GENITAL HERPES 03/24/2010   GERD (gastroesophageal reflux disease) 06/28/2011   HYPERLIPIDEMIA 03/24/2010   HYPERTENSION 09/17/2006   HYPOTHYROIDISM 09/17/2006   LIPOMA 12/06/2009   PERIPHERAL EDEMA 03/24/2010   Renal cyst 07/05/2011   1.9 cm minimally complex   Past Surgical History:  Procedure Laterality Date   ABDOMINAL HYSTERECTOMY     fibroids   ankle surgury     x 3 left - chronic pain/swelling   COLONOSCOPY  2011   SHOULDER SURGERY Left    TONSILLECTOMY     TUBAL LIGATION     Patient Active Problem List   Diagnosis Date Noted   Multiple skin nodules 06/28/2022   Dental infection 04/05/2022   Peripheral edema 03/26/2022   Leg length discrepancy 09/28/2020   Greater trochanteric bursitis, right 08/09/2020   Posterior vitreous detachment of right eye 02/18/2020   Posterior vitreous detachment of left eye 02/18/2020   Diabetes mellitus without complication (HCC) 02/18/2020   Nuclear sclerotic cataract of both eyes 02/18/2020   Urinary frequency 01/11/2020   Pelvic pain 01/11/2020   Vitamin D deficiency 01/11/2020   B12 deficiency 01/11/2020   Superficial phlebitis 09/03/2019   Pain of right thumb 01/06/2019   Sprain  of second toe, left, initial encounter 12/18/2017   Acute upper respiratory infection 01/04/2017   Greater trochanteric bursitis of left hip 12/19/2015   Right otitis media 12/16/2015   Shoulder dislocation 08/03/2015   Anxiety state 07/01/2015   Cellulitis 04/01/2015   Osteoarthritis of left ankle 04/01/2015   Bilateral leg pain 02/15/2015   PVD (peripheral vascular disease) (HCC) 12/22/2014   Cellulitis of right lower leg 12/13/2014   Right leg pain 11/10/2014   Hx of colonic polyps 09/08/2013   Obesity (BMI 30-39.9) 09/08/2013   Impingement syndrome of left shoulder 06/04/2012   Mild aortic stenosis 12/05/2011   Asthma 12/05/2011   Renal cyst 07/05/2011   Low back pain 06/28/2011   GERD (gastroesophageal reflux disease) 06/28/2011   Carpal tunnel syndrome of right wrist 04/22/2011   Right foot pain 05/23/2010   Encounter for well adult exam with abnormal findings 05/23/2010   GENITAL HERPES 03/24/2010   Hyperlipidemia 03/24/2010   Nocturia 03/24/2010   LIPOMA 12/06/2009   ANEMIA-IRON DEFICIENCY 12/06/2009   CONJUNCTIVITIS, BILATERAL 12/06/2009   DIVERTICULOSIS, COLON 12/06/2009   HYPOTHYROIDISM 09/17/2006   Diabetes (HCC) 09/17/2006   Essential hypertension 09/17/2006    PCP: Corwin Levins, MD   REFERRING PROVIDER: Richardean Sale, DO  REFERRING DIAG: [phone] (ICD-10-CM) - Chronic pain of left ankle M19.172 (ICD-10-CM) - Post-traumatic osteoarthritis of left ankle  THERAPY DIAG:  Pain in left ankle and  joints of left foot  Muscle weakness (generalized)  Stiffness of left ankle, not elsewhere classified  Cramp and spasm  Difficulty in walking, not elsewhere classified  Rationale for Evaluation and Treatment: Rehabilitation  ONSET DATE: 08/02/2022  SUBJECTIVE:   SUBJECTIVE STATEMENT: Patient reports "its doing ok, just a little sore". Pain reported at 3/10.  Mild limp.     PERTINENT HISTORY: ORIF left ankle 5 years ago PAIN:  08/29/22: Are  you having pain? Yes: NPRS scale: 3/10 Pain location: left ankle, medial side and R forefoot Pain description: aching, "feels like when you twist your wrist"   Aggravating factors: "when I move it the wrong way", unclear  Relieving factors: "green oil", icy hot, "rubbing it down"   PRECAUTIONS: None  WEIGHT BEARING RESTRICTIONS: No  FALLS:  Has patient fallen in last 6 months? No  LIVING ENVIRONMENT: Lives with: lives alone Lives in: House/apartment Stairs: Yes: External: 3 steps; on right going up Has following equipment at home: Single point cane  OCCUPATION: retired  PLOF: Independent, Independent with household mobility without device, Independent with community mobility without device, Independent with homemaking with ambulation, Independent with gait, and Independent with transfers  PATIENT GOALS: To keep my left ankle from flaring up and giving me trouble  NEXT MD VISIT: prn  OBJECTIVE:   DIAGNOSTIC FINDINGS: na  PATIENT SURVEYS:  LEFS: 12/80  COGNITION: Overall cognitive status: Within functional limits for tasks assessed     SENSATION: WFL   POSTURE:  slightly fwd bent with min knee extension and short step length  PALPATION: Tender at peroneal tendon area  LOWER EXTREMITY ROM:  Active ROM Right eval Left eval  Ankle dorsiflexion  Neutral (0)  Ankle plantarflexion  20  Ankle inversion  20  Ankle eversion  5   (Blank rows = not tested)  LOWER EXTREMITY MMT:  MMT Right eval Left eval  Ankle dorsiflexion  5  Ankle plantarflexion  5-  Ankle inversion  4  Ankle eversion  3+   (Blank rows = not tested)  LOWER EXTREMITY SPECIAL TESTS:  NA  FUNCTIONAL TESTS:  5 times sit to stand: 14.86 sec Timed up and go (TUG): 10.93 sec  GAIT: Distance walked: 30 Assistive device utilized: Single point cane and None Level of assistance: Modified independence Comments: Typically does not use any a.d but just bought a cane.  I have it in case I need it.      TODAY'S TREATMENT:                                                                                                                              DATE:  08/29/22 TherEx Nustep L3 x 5 minutes BLEs  Gastroc stretch on rocker board 10 times, 10 seconds both  Soleus stretch on rocker board 10 times, 10 seconds left  Plantar fascia stretches on prostretch 10 times, 10 seconds left  Rocker board x 2 min Single leg inversion/eversion x 20  on half roll Seated toe and heel raises x 20  NMR On purple foam pad: Step up and hold x 10 fwd then x 10 lateral (left) On purple foam pad: Marching x 20 Alternating cone taps x 20 SLS with one foot on 6 inch step 3x30 seconds B  Seated BAPS using circle board x 20 DF/PF, then x 20 cw/ccw    08/24/22 TherEx Nustep L3 x6 minutes BLEs  Gastroc stretch on door 2x30 seconds B  Plantar fascia stretches off step 2x30 seconds B Ankle PF/DF on rocker board x20 BLEs Single leg inversion/eversion x10 B on rocker board  NMR On purple foam pad: Tandem stance 3x30 seconds B  SLS with one foot on 6 inch step 3x30 seconds B  Narrow BOS 3x60 seconds  Double step tap x6 B    08/20/22  TherEX Long sitting gastroc stretches 6x30 seconds (in between reps of manual techniques) Ankle alphabet x3 rounds  4 way ankle red TB x12 each direction (mod tactile cues for good form) Gastroc stretch on doorway 3x30 seconds  SLS on pink foam pad 3x30 seconds L LE intermittent UE touches on counter  Forward step ups onto pink foam pad L LE leading x12  Tandem stance L foot in back 3x30 seconds Manual Forefoot inversion/eversion grade II (tender) Talar distraction/dorsiflexion mobs 4x10 seconds Ankle DF joint mobs grade II 2x30 seconds  Calcaneal eversion mobs grade III 3 rounds  SelfCare Encouraged consistent use of compression stocking to help manage edema- pitting edema noted BLEs   Relation between ankle ROM/strength and balance   08/09/22 Initiated HEP, Initial  eval completed   PATIENT EDUCATION:  Education details: Initiated HEP Person educated: Patient Education method: Programmer, multimedia, Facilities manager, Verbal cues, and Handouts Education comprehension: verbalized understanding and returned demonstration  HOME EXERCISE PROGRAM: Access Code: [email or web address] URL: [email or web address] Date: 08/09/2022 Prepared by: Mikey Kirschner  Exercises - Seated Ankle Alphabet  - 1 x daily - 7 x weekly - 3 sets - 10 reps - Long Sitting Calf Stretch with Strap  - 1 x daily - 7 x weekly - 3 sets - 10 reps - Seated Ankle Inversion Eversion PROM  - 1 x daily - 7 x weekly - 3 sets - 10 reps - Seated Heel Raise  - 1 x daily - 7 x weekly - 3 sets - 10 reps - Seated Toe Raise  - 1 x daily - 7 x weekly - 3 sets - 10 reps  ASSESSMENT:  CLINICAL IMPRESSION: Raetta is progressing appropriately.  She was able to complete all tasks today with good tolerance and no elevation of pain.  She needed verbal cues for avoiding "bouncing" on her stretches.  She is doing more activity at home with less pain but does admits she still gets some soreness at times.   OBJECTIVE IMPAIRMENTS: Abnormal gait, decreased balance, decreased mobility, difficulty walking, decreased ROM, decreased strength, hypomobility, increased edema, increased fascial restrictions, increased muscle spasms, impaired flexibility, and pain.   ACTIVITY LIMITATIONS: carrying, lifting, bending, standing, squatting, stairs, transfers, dressing, and locomotion level  PARTICIPATION LIMITATIONS: meal prep, cleaning, laundry, driving, shopping, community activity, yard work, and church  PERSONAL FACTORS: Fitness and 1-2 comorbidities: Diabetes, htn  are also affecting patient's functional outcome.   REHAB POTENTIAL: Good  CLINICAL DECISION MAKING: Evolving/moderate complexity  EVALUATION COMPLEXITY: Moderate   GOALS: Goals reviewed with patient? Yes  SHORT TERM GOALS: Target date: 09/06/2022  Pain  report to be no greater than 4/10  Baseline: Goal status:  INITIAL  2.  Patient will be independent with initial HEP  Baseline:  Goal status: INITIAL   LONG TERM GOALS: Target date: 10/04/2022   Patient to report pain no greater than 2/10  Baseline:  Goal status: INITIAL  2.  Patient to be independent with advanced HEP  Baseline:  Goal status: INITIAL  3.  Patient to improve by 2-3 seconds on 5 times sit to stand and TUG Baseline:  Goal status: INITIAL  4.  Ankle DF to improve to 10-15 degrees Baseline:  Goal status: INITIAL  5.  Patient to be able to ascend and descend steps with reciprocal gait Baseline:  Goal status: INITIAL  6.  LEFS score to improve by 2 points Baseline:  Goal status: INITIAL   PLAN:  PT FREQUENCY: 1-2x/week  PT DURATION: 8 weeks  PLANNED INTERVENTIONS: Therapeutic exercises, Therapeutic activity, Neuromuscular re-education, Balance training, Gait training, Patient/Family education, Self Care, Joint mobilization, Stair training, Orthotic/Fit training, DME instructions, Aquatic Therapy, Dry Needling, Electrical stimulation, Cryotherapy, Moist heat, Compression bandaging, scar mobilization, Splintting, Taping, Vasopneumatic device, Ultrasound, Ionotophoresis 4mg /ml Dexamethasone, Manual therapy, and Re-evaluation  PLAN FOR NEXT SESSION: PROM and ankle mobility, progress ankle stability training, Nustep.   Victorino Dike B. Verbie Babic, PT 08/29/22 5:26 PM [facility] [address] [place], [place] [address] Phone # [phone] Fax [phone]

## 2022-09-03 ENCOUNTER — Ambulatory Visit (INDEPENDENT_AMBULATORY_CARE_PROVIDER_SITE_OTHER): Payer: 59 | Admitting: Sports Medicine

## 2022-09-03 VITALS — BP 122/82 | HR 61 | Ht 59.0 in | Wt 192.0 lb

## 2022-09-03 DIAGNOSIS — M25572 Pain in left ankle and joints of left foot: Secondary | ICD-10-CM | POA: Diagnosis not present

## 2022-09-03 DIAGNOSIS — M19172 Post-traumatic osteoarthritis, left ankle and foot: Secondary | ICD-10-CM | POA: Diagnosis not present

## 2022-09-03 DIAGNOSIS — G8929 Other chronic pain: Secondary | ICD-10-CM | POA: Diagnosis not present

## 2022-09-03 NOTE — Progress Notes (Signed)
[name] D.[facility] [address] [place] [address] Phone: [phone]   Assessment and Plan:     1. Chronic pain of left ankle 2. Post-traumatic osteoarthritis of left ankle  -Chronic with exacerbation, subsequent visit - Consistent with flare of posttraumatic osteoarthritis of the left ankle but overall is improving after completing 2-week course of meloxicam and starting physical therapy - Recommend continuing HEP and physical therapy for overall strengthening - May gradually increase daily walking and stationary bike as tolerated - Discontinue meloxicam - Start Tylenol 500 to 1000 mg 2 times a day as needed for day-to-day pain relief - May use compression stockings to decrease swelling in left ankle  Pertinent previous records reviewed include physical therapy note 08/29/2022, physical therapy note 08/24/2022, physical therapy note 08/16/2022, physical therapy note 08/09/2022   Follow Up: As needed if no improvement or worsening of symptoms.  Could consider intra-articular ankle CSI versus repeat short course of NSAIDs   Subjective:   I, Brandi Shaffer, am serving as a Neurosurgeon for [name]   Chief Complaint: left ankle pain    HPI:  Update 11/29/2020 Brandi Shaffer is a 77 y.o. female coming in with complaint of R hip and L ankle pain. Injected in June 2022.  This was for more of a greater trochanteric bursitis. States that when she wears flat shoes or is barefoot she gets a shooting pain down the anterior side of her right thigh.   Patient did have x-rays taken of the lumbar spine showing the patient does have facet arthropathy noted.  These were independently visualized by me again today. Patient is also had x-rays of the ankle done in January 2020 showing the patient did have posttraumatic arthritic changes noted.   08/02/2022 Patient states that she has had pain for awhile , she feels like something in her ankle  slipped and it painful    09/03/2022 Patient states that she has intermittent pain , ankle is getting there    Relevant Historical Information: Hypertension, DM type II, hypothyroidism  Additional pertinent review of systems negative.   Current Outpatient Medications:    albuterol (VENTOLIN HFA) 108 (90 Base) MCG/ACT inhaler, INHALE 2 PUFFS BY MOUTH EVERY 6 HOURS AS NEEDED FOR WHEEZE, Disp: 8.5 each, Rfl: 3   amLODipine (NORVASC) 10 MG tablet, TAKE 1 TABLET BY MOUTH EVERY DAY, Disp: 90 tablet, Rfl: 3   ammonium lactate (LAC-HYDRIN) 12 % lotion, APPLY 1 APPLICATION TOPICALLY AS NEEDED FOR DRY SKIN., Disp: 400 mL, Rfl: 5   amoxicillin-clavulanate (AUGMENTIN) 875-125 MG tablet, Take 1 tablet by mouth 2 (two) times daily., Disp: 20 tablet, Rfl: 0   aspirin 81 MG EC tablet, TAKE 1 TABLET BY MOUTH DAILY. SWALLOW WHOLE., Disp: 30 tablet, Rfl: 11   chlorhexidine (PERIDEX) 0.12 % solution, Use as directed 15 mLs in the mouth or throat 2 (two) times daily., Disp: 473 mL, Rfl: 11   Cholecalciferol 50 MCG (2000 UT) TABS, 1 tab by mouth once daily, Disp: 30 tablet, Rfl: 99   clotrimazole (LOTRIMIN) 1 % cream, Apply to affected feet and between toes twice daily for 6 weeks for athlete's feet, Disp: 60 g, Rfl: 1   cyclobenzaprine (FLEXERIL) 5 MG tablet, Take 1 tablet (5 mg total) by mouth 3 (three) times daily as needed for muscle spasms., Disp: 30 tablet, Rfl: 1   fluconazole (DIFLUCAN) 150 MG tablet, Take 1 tablet (150 mg total) by mouth every three (3) days as  needed., Disp: 2 tablet, Rfl: 1   furosemide (LASIX) 20 MG tablet, Take 1 tablet (20 mg total) by mouth daily as needed., Disp: 90 tablet, Rfl: 3   losartan (COZAAR) 25 MG tablet, Take 1 tablet (25 mg total) by mouth daily., Disp: 90 tablet, Rfl: 3   meloxicam (MOBIC) 15 MG tablet, Take 1 tablet (15 mg total) by mouth daily., Disp: 30 tablet, Rfl: 0   Multiple Vitamins-Minerals (WOMENS MULTIVITAMIN PO), Take 1 tablet by mouth daily., Disp: , Rfl:     OneTouch Delica Lancets 33G MISC, 1 each by Does not apply route 2 (two) times daily. Use to check blood sugars twice a day Dx E11.9, Disp: 200 each, Rfl: 11   ONETOUCH VERIO test strip, USE 2 (TWO) TIMES DAILY. DX E11.9, Disp: 100 strip, Rfl: 4   pioglitazone (ACTOS) 15 MG tablet, TAKE 1 TABLET BY MOUTH EVERY DAY, Disp: 90 tablet, Rfl: 2   potassium chloride (KLOR-CON) 10 MEQ tablet, TAKE 1 TABLET BY MOUTH EVERY DAY, Disp: 90 tablet, Rfl: 3   rosuvastatin (CRESTOR) 20 MG tablet, Take 1 tablet (20 mg total) by mouth daily., Disp: 90 tablet, Rfl: 3   vitamin B-12 (CYANOCOBALAMIN) 1000 MCG tablet, 1 tab by mouth mon - wed - thur, Disp: 90 tablet, Rfl: 3   Objective:     Vitals:   09/03/22 0853  BP: 122/82  Pulse: 61  SpO2: 100%  Weight: 192 lb (87.1 kg)  Height: 4\' 11"  (1.499 m)      Body mass index is 38.78 kg/m.    Physical Exam:    Gen: Appears well, nad, nontoxic and pleasant Psych: Alert and oriented, appropriate mood and affect Neuro: sensation intact, strength is 5/5 with df/pf/inv/ev, muscle tone wnl Skin: no susupicious lesions or rashes   Left ankle:  Well healed Surgical scar over medial and lateral ankle No deformity 1+ pitting edema over left ankle TTP mildly anterior joint line, medial malleolus NTTP over fibular head, lat mal,   achilles,  , base of 5th,  deltoid, calcaneous or midfoot ROM DF 30, PF 35, inv/ev limited Negative ant drawer, talar tilt, rotation test, squeeze test. Neg thompson     Electronically signed by:  [name] D.[facility] 9:19 AM 09/03/22

## 2022-09-03 NOTE — Patient Instructions (Signed)
Stop meloxicam  Tylenol [phone] mg 2x times a day for pain relief  Continue PT  As needed follow up if pain worsens or if you want an injection

## 2022-09-05 ENCOUNTER — Ambulatory Visit: Payer: 59

## 2022-09-05 DIAGNOSIS — R262 Difficulty in walking, not elsewhere classified: Secondary | ICD-10-CM

## 2022-09-05 DIAGNOSIS — M25672 Stiffness of left ankle, not elsewhere classified: Secondary | ICD-10-CM

## 2022-09-05 DIAGNOSIS — M25572 Pain in left ankle and joints of left foot: Secondary | ICD-10-CM | POA: Diagnosis not present

## 2022-09-05 DIAGNOSIS — M6281 Muscle weakness (generalized): Secondary | ICD-10-CM | POA: Diagnosis not present

## 2022-09-05 DIAGNOSIS — R252 Cramp and spasm: Secondary | ICD-10-CM | POA: Diagnosis not present

## 2022-09-05 NOTE — Therapy (Signed)
OUTPATIENT PHYSICAL THERAPY LOWER EXTREMITY TREATMENT   Patient Name: Brandi Shaffer MRN: [id number] DOB:1946/02/11, 77 y.o., female Today's Date: 09/05/2022  END OF SESSION:  PT End of Session - 09/05/22 1236     Visit Number 5    Date for PT Re-Evaluation 10/04/22    Authorization Type [employer] MEDICARE [employer] DUAL COMPLETE    PT Start Time 1236    PT Stop Time 1315    PT Time Calculation (min) 39 min    Activity Tolerance Patient tolerated treatment well    Behavior During Therapy [facility] for tasks assessed/performed               Past Medical History:  Diagnosis Date   ANEMIA-IRON DEFICIENCY 12/06/2009   Anxiety state 07/01/2015   Asthma 12/05/2011   DIABETES MELLITUS, TYPE II 09/17/2006   DIVERTICULOSIS, COLON 12/06/2009   GENITAL HERPES 03/24/2010   GERD (gastroesophageal reflux disease) 06/28/2011   HYPERLIPIDEMIA 03/24/2010   HYPERTENSION 09/17/2006   HYPOTHYROIDISM 09/17/2006   LIPOMA 12/06/2009   PERIPHERAL EDEMA 03/24/2010   Renal cyst 07/05/2011   1.9 cm minimally complex   Past Surgical History:  Procedure Laterality Date   ABDOMINAL HYSTERECTOMY     fibroids   ankle surgury     x 3 left - chronic pain/swelling   COLONOSCOPY  2011   SHOULDER SURGERY Left    TONSILLECTOMY     TUBAL LIGATION     Patient Active Problem List   Diagnosis Date Noted   Multiple skin nodules 06/28/2022   Dental infection 04/05/2022   Peripheral edema 03/26/2022   Leg length discrepancy 09/28/2020   Greater trochanteric bursitis, right 08/09/2020   Posterior vitreous detachment of right eye 02/18/2020   Posterior vitreous detachment of left eye 02/18/2020   Diabetes mellitus without complication (HCC) 02/18/2020   Nuclear sclerotic cataract of both eyes 02/18/2020   Urinary frequency 01/11/2020   Pelvic pain 01/11/2020   Vitamin D deficiency 01/11/2020   B12 deficiency 01/11/2020   Superficial phlebitis 09/03/2019   Pain of right thumb 01/06/2019   Sprain  of second toe, left, initial encounter 12/18/2017   Acute upper respiratory infection 01/04/2017   Greater trochanteric bursitis of left hip 12/19/2015   Right otitis media 12/16/2015   Shoulder dislocation 08/03/2015   Anxiety state 07/01/2015   Cellulitis 04/01/2015   Osteoarthritis of left ankle 04/01/2015   Bilateral leg pain 02/15/2015   PVD (peripheral vascular disease) (HCC) 12/22/2014   Cellulitis of right lower leg 12/13/2014   Right leg pain 11/10/2014   Hx of colonic polyps 09/08/2013   Obesity (BMI 30-39.9) 09/08/2013   Impingement syndrome of left shoulder 06/04/2012   Mild aortic stenosis 12/05/2011   Asthma 12/05/2011   Renal cyst 07/05/2011   Low back pain 06/28/2011   GERD (gastroesophageal reflux disease) 06/28/2011   Carpal tunnel syndrome of right wrist 04/22/2011   Right foot pain 05/23/2010   Encounter for well adult exam with abnormal findings 05/23/2010   GENITAL HERPES 03/24/2010   Hyperlipidemia 03/24/2010   Nocturia 03/24/2010   LIPOMA 12/06/2009   ANEMIA-IRON DEFICIENCY 12/06/2009   CONJUNCTIVITIS, BILATERAL 12/06/2009   DIVERTICULOSIS, COLON 12/06/2009   HYPOTHYROIDISM 09/17/2006   Diabetes (HCC) 09/17/2006   Essential hypertension 09/17/2006    PCP: Corwin Levins, MD   REFERRING PROVIDER: Richardean Sale, DO  REFERRING DIAG: [phone] (ICD-10-CM) - Chronic pain of left ankle M19.172 (ICD-10-CM) - Post-traumatic osteoarthritis of left ankle  THERAPY DIAG:  Pain in left ankle and  joints of left foot  Muscle weakness (generalized)  Stiffness of left ankle, not elsewhere classified  Cramp and spasm  Difficulty in walking, not elsewhere classified  Rationale for Evaluation and Treatment: Rehabilitation  ONSET DATE: 08/02/2022  SUBJECTIVE:   SUBJECTIVE STATEMENT: Patient reports "no pain at the moment".  Slow on start up with slight limp, then gait normalized.     PERTINENT HISTORY: ORIF left ankle 5 years ago PAIN:   09/05/22: Are you having pain? Yes: NPRS scale: 0/10 Pain location: left ankle, medial side and R forefoot Pain description: aching, "feels like when you twist your wrist"   Aggravating factors: "when I move it the wrong way", unclear  Relieving factors: "green oil", icy hot, "rubbing it down"   PRECAUTIONS: None  WEIGHT BEARING RESTRICTIONS: No  FALLS:  Has patient fallen in last 6 months? No  LIVING ENVIRONMENT: Lives with: lives alone Lives in: House/apartment Stairs: Yes: External: 3 steps; on right going up Has following equipment at home: Single point cane  OCCUPATION: retired  PLOF: Independent, Independent with household mobility without device, Independent with community mobility without device, Independent with homemaking with ambulation, Independent with gait, and Independent with transfers  PATIENT GOALS: To keep my left ankle from flaring up and giving me trouble  NEXT MD VISIT: prn  OBJECTIVE:   DIAGNOSTIC FINDINGS: na  PATIENT SURVEYS:  LEFS: 12/80  COGNITION: Overall cognitive status: Within functional limits for tasks assessed     SENSATION: WFL   POSTURE:  slightly fwd bent with min knee extension and short step length  PALPATION: Tender at peroneal tendon area  LOWER EXTREMITY ROM:  Active ROM Right eval Left eval  Ankle dorsiflexion  Neutral (0)  Ankle plantarflexion  20  Ankle inversion  20  Ankle eversion  5   (Blank rows = not tested)  LOWER EXTREMITY MMT:  MMT Right eval Left eval  Ankle dorsiflexion  5  Ankle plantarflexion  5-  Ankle inversion  4  Ankle eversion  3+   (Blank rows = not tested)  LOWER EXTREMITY SPECIAL TESTS:  NA  FUNCTIONAL TESTS:  5 times sit to stand: 14.86 sec Timed up and go (TUG): 10.93 sec  GAIT: Distance walked: 30 Assistive device utilized: Single point cane and None Level of assistance: Modified independence Comments: Typically does not use any a.d but just bought a cane.  I have it in  case I need it.     TODAY'S TREATMENT:                                                                                                                              DATE:  09/05/22 TherEx Nustep L3 x 5 minutes BLEs  Rocker board x 2 min Gastroc stretch on rocker board 3 times 30 sec Soleus stretch on rocker board 3 times 30 Plantar fascia stretches on prostretch 5 times, 10 seconds left  Single leg inversion/eversion x 20 on half roll  NMR On purple foam pad: Step up and hold x 10 fwd then x 10 lateral (left) On purple foam pad: Marching x 20 Alternating cone taps x 20 SLS floor with heavy vc's for weight shift and balance strategies 10 attempts at 5 sec each    08/29/22 TherEx Nustep L3 x 5 minutes BLEs  Gastroc stretch on rocker board 10 times, 10 seconds both  Soleus stretch on rocker board 10 times, 10 seconds left  Plantar fascia stretches on prostretch 10 times, 10 seconds left  Rocker board x 2 min Single leg inversion/eversion x 20 on half roll Seated toe and heel raises x 20  NMR On purple foam pad: Step up and hold x 10 fwd then x 10 lateral (left) On purple foam pad: Marching x 20 Alternating cone taps x 20 SLS with one foot on 6 inch step 3x30 seconds B  Seated BAPS using circle board x 20 DF/PF, then x 20 cw/ccw    08/24/22 TherEx Nustep L3 x6 minutes BLEs  Gastroc stretch on door 2x30 seconds B  Plantar fascia stretches off step 2x30 seconds B Ankle PF/DF on rocker board x20 BLEs Single leg inversion/eversion x10 B on rocker board  NMR On purple foam pad: Tandem stance 3x30 seconds B  SLS with one foot on 6 inch step 3x30 seconds B  Narrow BOS 3x60 seconds  Double step tap x6 B     08/09/22 Initiated HEP, Initial eval completed   PATIENT EDUCATION:  Education details: Initiated HEP Person educated: Patient Education method: Programmer, multimedia, Facilities manager, Verbal cues, and Handouts Education comprehension: verbalized understanding and returned  demonstration  HOME EXERCISE PROGRAM: Access Code: [email or web address] URL: [email or web address] Date: 08/09/2022 Prepared by: Mikey Kirschner  Exercises - Seated Ankle Alphabet  - 1 x daily - 7 x weekly - 3 sets - 10 reps - Long Sitting Calf Stretch with Strap  - 1 x daily - 7 x weekly - 3 sets - 10 reps - Seated Ankle Inversion Eversion PROM  - 1 x daily - 7 x weekly - 3 sets - 10 reps - Seated Heel Raise  - 1 x daily - 7 x weekly - 3 sets - 10 reps - Seated Toe Raise  - 1 x daily - 7 x weekly - 3 sets - 10 reps  ASSESSMENT:  CLINICAL IMPRESSION: Maximina is tolerating higher level stability exercises and weight shift.  She still has some minor aching on start up but then gait normalizes.  She was able to hold one of her SLS reps for 4 sec today.  She is well motivated and compliant.  She should continue to improve.  She would benefit from continued skilled PT for ROM, stability and strengthening.    OBJECTIVE IMPAIRMENTS: Abnormal gait, decreased balance, decreased mobility, difficulty walking, decreased ROM, decreased strength, hypomobility, increased edema, increased fascial restrictions, increased muscle spasms, impaired flexibility, and pain.   ACTIVITY LIMITATIONS: carrying, lifting, bending, standing, squatting, stairs, transfers, dressing, and locomotion level  PARTICIPATION LIMITATIONS: meal prep, cleaning, laundry, driving, shopping, community activity, yard work, and church  PERSONAL FACTORS: Fitness and 1-2 comorbidities: Diabetes, htn  are also affecting patient's functional outcome.   REHAB POTENTIAL: Good  CLINICAL DECISION MAKING: Evolving/moderate complexity  EVALUATION COMPLEXITY: Moderate   GOALS: Goals reviewed with patient? Yes  SHORT TERM GOALS: Target date: 09/06/2022  Pain report to be no greater than 4/10  Baseline: Goal status: MET 09/05/22  2.  Patient will be independent with initial HEP  Baseline:  Goal status: MET 09/05/22   LONG TERM  GOALS: Target date: 10/04/2022   Patient to report pain no greater than 2/10  Baseline:  Goal status: INITIAL  2.  Patient to be independent with advanced HEP  Baseline:  Goal status: INITIAL  3.  Patient to improve by 2-3 seconds on 5 times sit to stand and TUG Baseline:  Goal status: INITIAL  4.  Ankle DF to improve to 10-15 degrees Baseline:  Goal status: INITIAL  5.  Patient to be able to ascend and descend steps with reciprocal gait Baseline:  Goal status: INITIAL  6.  LEFS score to improve by 2 points Baseline:  Goal status: INITIAL   PLAN:  PT FREQUENCY: 1-2x/week  PT DURATION: 8 weeks  PLANNED INTERVENTIONS: Therapeutic exercises, Therapeutic activity, Neuromuscular re-education, Balance training, Gait training, Patient/Family education, Self Care, Joint mobilization, Stair training, Orthotic/Fit training, DME instructions, Aquatic Therapy, Dry Needling, Electrical stimulation, Cryotherapy, Moist heat, Compression bandaging, scar mobilization, Splintting, Taping, Vasopneumatic device, Ultrasound, Ionotophoresis 4mg /ml Dexamethasone, Manual therapy, and Re-evaluation  PLAN FOR NEXT SESSION: PROM and ankle mobility, progress ankle stability training, Nustep.   Victorino Dike B. Oria Klimas, PT 09/05/22 1:28 PM  [facility] [address] [place], [place] [address] Phone # [phone] Fax [phone]

## 2022-09-07 ENCOUNTER — Ambulatory Visit: Payer: 59

## 2022-09-07 DIAGNOSIS — M25572 Pain in left ankle and joints of left foot: Secondary | ICD-10-CM | POA: Diagnosis not present

## 2022-09-07 DIAGNOSIS — R252 Cramp and spasm: Secondary | ICD-10-CM

## 2022-09-07 DIAGNOSIS — M25672 Stiffness of left ankle, not elsewhere classified: Secondary | ICD-10-CM | POA: Diagnosis not present

## 2022-09-07 DIAGNOSIS — R262 Difficulty in walking, not elsewhere classified: Secondary | ICD-10-CM

## 2022-09-07 DIAGNOSIS — M6281 Muscle weakness (generalized): Secondary | ICD-10-CM

## 2022-09-07 NOTE — Therapy (Signed)
OUTPATIENT PHYSICAL THERAPY LOWER EXTREMITY TREATMENT   Patient Name: Brandi Shaffer MRN: [id number] DOB:March 25, 1945, 77 y.o., female Today's Date: 09/07/2022  END OF SESSION:  PT End of Session - 09/07/22 0804     Visit Number 6    Date for PT Re-Evaluation 10/04/22    Authorization Type [employer] MEDICARE UNITED[employer] DUAL COMPLETE    PT Start Time 0801    PT Stop Time 0846    PT Time Calculation (min) 45 min    Activity Tolerance Patient tolerated treatment well    Behavior During Therapy [facility] for tasks assessed/performed               Past Medical History:  Diagnosis Date   ANEMIA-IRON DEFICIENCY 12/06/2009   Anxiety state 07/01/2015   Asthma 12/05/2011   DIABETES MELLITUS, TYPE II 09/17/2006   DIVERTICULOSIS, COLON 12/06/2009   GENITAL HERPES 03/24/2010   GERD (gastroesophageal reflux disease) 06/28/2011   HYPERLIPIDEMIA 03/24/2010   HYPERTENSION 09/17/2006   HYPOTHYROIDISM 09/17/2006   LIPOMA 12/06/2009   PERIPHERAL EDEMA 03/24/2010   Renal cyst 07/05/2011   1.9 cm minimally complex   Past Surgical History:  Procedure Laterality Date   ABDOMINAL HYSTERECTOMY     fibroids   ankle surgury     x 3 left - chronic pain/swelling   COLONOSCOPY  2011   SHOULDER SURGERY Left    TONSILLECTOMY     TUBAL LIGATION     Patient Active Problem List   Diagnosis Date Noted   Multiple skin nodules 06/28/2022   Dental infection 04/05/2022   Peripheral edema 03/26/2022   Leg length discrepancy 09/28/2020   Greater trochanteric bursitis, right 08/09/2020   Posterior vitreous detachment of right eye 02/18/2020   Posterior vitreous detachment of left eye 02/18/2020   Diabetes mellitus without complication (HCC) 02/18/2020   Nuclear sclerotic cataract of both eyes 02/18/2020   Urinary frequency 01/11/2020   Pelvic pain 01/11/2020   Vitamin D deficiency 01/11/2020   B12 deficiency 01/11/2020   Superficial phlebitis 09/03/2019   Pain of right thumb 01/06/2019   Sprain  of second toe, left, initial encounter 12/18/2017   Acute upper respiratory infection 01/04/2017   Greater trochanteric bursitis of left hip 12/19/2015   Right otitis media 12/16/2015   Shoulder dislocation 08/03/2015   Anxiety state 07/01/2015   Cellulitis 04/01/2015   Osteoarthritis of left ankle 04/01/2015   Bilateral leg pain 02/15/2015   PVD (peripheral vascular disease) (HCC) 12/22/2014   Cellulitis of right lower leg 12/13/2014   Right leg pain 11/10/2014   Hx of colonic polyps 09/08/2013   Obesity (BMI 30-39.9) 09/08/2013   Impingement syndrome of left shoulder 06/04/2012   Mild aortic stenosis 12/05/2011   Asthma 12/05/2011   Renal cyst 07/05/2011   Low back pain 06/28/2011   GERD (gastroesophageal reflux disease) 06/28/2011   Carpal tunnel syndrome of right wrist 04/22/2011   Right foot pain 05/23/2010   Encounter for well adult exam with abnormal findings 05/23/2010   GENITAL HERPES 03/24/2010   Hyperlipidemia 03/24/2010   Nocturia 03/24/2010   LIPOMA 12/06/2009   ANEMIA-IRON DEFICIENCY 12/06/2009   CONJUNCTIVITIS, BILATERAL 12/06/2009   DIVERTICULOSIS, COLON 12/06/2009   HYPOTHYROIDISM 09/17/2006   Diabetes (HCC) 09/17/2006   Essential hypertension 09/17/2006    PCP: Corwin Levins, MD   REFERRING PROVIDER: Richardean Sale, DO  REFERRING DIAG: [phone] (ICD-10-CM) - Chronic pain of left ankle M19.172 (ICD-10-CM) - Post-traumatic osteoarthritis of left ankle  THERAPY DIAG:  Pain in left ankle and  joints of left foot  Muscle weakness (generalized)  Stiffness of left ankle, not elsewhere classified  Cramp and spasm  Difficulty in walking, not elsewhere classified  Rationale for Evaluation and Treatment: Rehabilitation  ONSET DATE: 08/02/2022  SUBJECTIVE:   SUBJECTIVE STATEMENT: Patient reports "no pain at the moment".  Slow on start up with slight limp, then gait normalized.     PERTINENT HISTORY: ORIF left ankle 5 years ago PAIN:   09/05/22: Are you having pain? Yes: NPRS scale: 0/10 Pain location: left ankle, medial side and R forefoot Pain description: aching, "feels like when you twist your wrist"   Aggravating factors: "when I move it the wrong way", unclear  Relieving factors: "green oil", icy hot, "rubbing it down"   PRECAUTIONS: None  WEIGHT BEARING RESTRICTIONS: No  FALLS:  Has patient fallen in last 6 months? No  LIVING ENVIRONMENT: Lives with: lives alone Lives in: House/apartment Stairs: Yes: External: 3 steps; on right going up Has following equipment at home: Single point cane  OCCUPATION: retired  PLOF: Independent, Independent with household mobility without device, Independent with community mobility without device, Independent with homemaking with ambulation, Independent with gait, and Independent with transfers  PATIENT GOALS: To keep my left ankle from flaring up and giving me trouble  NEXT MD VISIT: prn  OBJECTIVE:   DIAGNOSTIC FINDINGS: na  PATIENT SURVEYS:  LEFS: 12/80  COGNITION: Overall cognitive status: Within functional limits for tasks assessed     SENSATION: WFL   POSTURE:  slightly fwd bent with min knee extension and short step length  PALPATION: Tender at peroneal tendon area  LOWER EXTREMITY ROM:  Active ROM Right eval Left eval  Ankle dorsiflexion  Neutral (0)  Ankle plantarflexion  20  Ankle inversion  20  Ankle eversion  5   (Blank rows = not tested)  LOWER EXTREMITY MMT:  MMT Right eval Left eval  Ankle dorsiflexion  5  Ankle plantarflexion  5-  Ankle inversion  4  Ankle eversion  3+   (Blank rows = not tested)  LOWER EXTREMITY SPECIAL TESTS:  NA  FUNCTIONAL TESTS:  5 times sit to stand: 14.86 sec Timed up and go (TUG): 10.93 sec  GAIT: Distance walked: 30 Assistive device utilized: Single point cane and None Level of assistance: Modified independence Comments: Typically does not use any a.d but just bought a cane.  I have it in  case I need it.     TODAY'S TREATMENT:                                                                                                                              DATE:  09/07/22 TherEx Nustep L5 x 5 minutes BLEs  Rocker board x 2 min Plantar fascia stretches on prostretch 5 times, 10 seconds left  Single leg inversion/eversion x 20 on half roll  TherAct Step ups on 6" step 2 x 10 leading with left LE Retro step 2  x 10 on 4" step Sit to stand x 10 with normal stance, then x 10 with staggered stand (left foot in back)  NMR On purple foam pad: Step up and hold x 10 fwd then x 10 lateral (left) On purple foam pad: Marching x 20 Alternating cone taps x 20 SLS floor with cone touches (cone at waist level) x 10  09/05/22 TherEx Nustep L3 x 5 minutes BLEs  Rocker board x 2 min Gastroc stretch on rocker board 3 times 30 sec Soleus stretch on rocker board 3 times 30 Plantar fascia stretches on prostretch 5 times, 10 seconds left  Single leg inversion/eversion x 20 on half roll  NMR On purple foam pad: Step up and hold x 10 fwd then x 10 lateral (left) On purple foam pad: Marching x 20 Alternating cone taps x 20 SLS floor with heavy vc's for weight shift and balance strategies 10 attempts at 5 sec each    08/29/22 TherEx Nustep L3 x 5 minutes BLEs  Gastroc stretch on rocker board 10 times, 10 seconds both  Soleus stretch on rocker board 10 times, 10 seconds left  Plantar fascia stretches on prostretch 10 times, 10 seconds left  Rocker board x 2 min Single leg inversion/eversion x 20 on half roll Seated toe and heel raises x 20  NMR On purple foam pad: Step up and hold x 10 fwd then x 10 lateral (left) On purple foam pad: Marching x 20 Alternating cone taps x 20 SLS with one foot on 6 inch step 3x30 seconds B  Seated BAPS using circle board x 20 DF/PF, then x 20 cw/ccw    08/24/22 TherEx Nustep L3 x6 minutes BLEs  Gastroc stretch on door 2x30 seconds B  Plantar fascia  stretches off step 2x30 seconds B Ankle PF/DF on rocker board x20 BLEs Single leg inversion/eversion x10 B on rocker board  NMR On purple foam pad: Tandem stance 3x30 seconds B  SLS with one foot on 6 inch step 3x30 seconds B  Narrow BOS 3x60 seconds  Double step tap x6 B     08/09/22 Initiated HEP, Initial eval completed   PATIENT EDUCATION:  Education details: Initiated HEP Person educated: Patient Education method: Programmer, multimedia, Facilities manager, Verbal cues, and Handouts Education comprehension: verbalized understanding and returned demonstration  HOME EXERCISE PROGRAM: Access Code: [email or web address] URL: [email or web address] Date: 08/09/2022 Prepared by: Mikey Kirschner  Exercises - Seated Ankle Alphabet  - 1 x daily - 7 x weekly - 3 sets - 10 reps - Long Sitting Calf Stretch with Strap  - 1 x daily - 7 x weekly - 3 sets - 10 reps - Seated Ankle Inversion Eversion PROM  - 1 x daily - 7 x weekly - 3 sets - 10 reps - Seated Heel Raise  - 1 x daily - 7 x weekly - 3 sets - 10 reps - Seated Toe Raise  - 1 x daily - 7 x weekly - 3 sets - 10 reps  ASSESSMENT:  CLINICAL IMPRESSION: Ceci is progressing appropriately.  She was able to do level 5 on Nustep today.  We added in functional step work without any problems as well. She did have some quad strength issues but no knee pain per her report.   She is well motivated and compliant.  She should continue to improve.  She would benefit from continued skilled PT for ROM, stability and strengthening.    OBJECTIVE IMPAIRMENTS: Abnormal gait, decreased balance, decreased mobility, difficulty walking, decreased ROM,  decreased strength, hypomobility, increased edema, increased fascial restrictions, increased muscle spasms, impaired flexibility, and pain.   ACTIVITY LIMITATIONS: carrying, lifting, bending, standing, squatting, stairs, transfers, dressing, and locomotion level  PARTICIPATION LIMITATIONS: meal prep, cleaning, laundry,  driving, shopping, community activity, yard work, and church  PERSONAL FACTORS: Fitness and 1-2 comorbidities: Diabetes, htn  are also affecting patient's functional outcome.   REHAB POTENTIAL: Good  CLINICAL DECISION MAKING: Evolving/moderate complexity  EVALUATION COMPLEXITY: Moderate   GOALS: Goals reviewed with patient? Yes  SHORT TERM GOALS: Target date: 09/06/2022  Pain report to be no greater than 4/10  Baseline: Goal status: MET 09/05/22  2.  Patient will be independent with initial HEP  Baseline:  Goal status: MET 09/05/22   LONG TERM GOALS: Target date: 10/04/2022   Patient to report pain no greater than 2/10  Baseline:  Goal status: INITIAL  2.  Patient to be independent with advanced HEP  Baseline:  Goal status: INITIAL  3.  Patient to improve by 2-3 seconds on 5 times sit to stand and TUG Baseline:  Goal status: INITIAL  4.  Ankle DF to improve to 10-15 degrees Baseline:  Goal status: INITIAL  5.  Patient to be able to ascend and descend steps with reciprocal gait Baseline:  Goal status: INITIAL  6.  LEFS score to improve by 2 points Baseline:  Goal status: INITIAL   PLAN:  PT FREQUENCY: 1-2x/week  PT DURATION: 8 weeks  PLANNED INTERVENTIONS: Therapeutic exercises, Therapeutic activity, Neuromuscular re-education, Balance training, Gait training, Patient/Family education, Self Care, Joint mobilization, Stair training, Orthotic/Fit training, DME instructions, Aquatic Therapy, Dry Needling, Electrical stimulation, Cryotherapy, Moist heat, Compression bandaging, scar mobilization, Splintting, Taping, Vasopneumatic device, Ultrasound, Ionotophoresis 4mg /ml Dexamethasone, Manual therapy, and Re-evaluation  PLAN FOR NEXT SESSION: Continue weight shifting and WB training, PROM and ankle mobility, progress ankle stability training, Nustep.   Victorino Dike B. Jessika Rothery, PT 09/07/22 8:47 AM  [facility] [address], [address] [place], [place] [address] Phone # [phone] Fax [phone]

## 2022-09-12 ENCOUNTER — Ambulatory Visit: Payer: 59

## 2022-09-12 DIAGNOSIS — M25672 Stiffness of left ankle, not elsewhere classified: Secondary | ICD-10-CM

## 2022-09-12 DIAGNOSIS — R262 Difficulty in walking, not elsewhere classified: Secondary | ICD-10-CM

## 2022-09-12 DIAGNOSIS — R252 Cramp and spasm: Secondary | ICD-10-CM

## 2022-09-12 DIAGNOSIS — M6281 Muscle weakness (generalized): Secondary | ICD-10-CM

## 2022-09-12 DIAGNOSIS — M25572 Pain in left ankle and joints of left foot: Secondary | ICD-10-CM

## 2022-09-12 NOTE — Therapy (Signed)
OUTPATIENT PHYSICAL THERAPY LOWER EXTREMITY TREATMENT   Patient Name: Brandi Shaffer MRN: [id number] DOB:[birth year], 77 y.o., female Today's Date: 09/12/2022  END OF SESSION:  PT End of Session - 09/12/22 0859     Visit Number 7    Date for PT Re-Evaluation 10/04/22    Authorization Type [employer] MEDICARE Suan Halter DUAL COMPLETE    PT Start Time [phone]    PT Stop Time 0930    PT Time Calculation (min) 38 min    Activity Tolerance Patient tolerated treatment well    Behavior During Therapy [facility] for tasks assessed/performed               Past Medical History:  Diagnosis Date   ANEMIA-IRON DEFICIENCY 12/06/2009   Anxiety state 07/01/2015   Asthma 12/05/2011   DIABETES MELLITUS, TYPE II 09/17/2006   DIVERTICULOSIS, COLON 12/06/2009   GENITAL HERPES 03/24/2010   GERD (gastroesophageal reflux disease) 06/28/2011   HYPERLIPIDEMIA 03/24/2010   HYPERTENSION 09/17/2006   HYPOTHYROIDISM 09/17/2006   LIPOMA 12/06/2009   PERIPHERAL EDEMA 03/24/2010   Renal cyst 07/05/2011   1.9 cm minimally complex   Past Surgical History:  Procedure Laterality Date   ABDOMINAL HYSTERECTOMY     fibroids   ankle surgury     x 3 left - chronic pain/swelling   COLONOSCOPY  2011   SHOULDER SURGERY Left    TONSILLECTOMY     TUBAL LIGATION     Patient Active Problem List   Diagnosis Date Noted   Multiple skin nodules 06/28/2022   Dental infection 04/05/2022   Peripheral edema 03/26/2022   Leg length discrepancy 09/28/2020   Greater trochanteric bursitis, right 08/09/2020   Posterior vitreous detachment of right eye 02/18/2020   Posterior vitreous detachment of left eye 02/18/2020   Diabetes mellitus without complication (HCC) 02/18/2020   Nuclear sclerotic cataract of both eyes 02/18/2020   Urinary frequency 01/11/2020   Pelvic pain 01/11/2020   Vitamin D deficiency 01/11/2020   B12 deficiency 01/11/2020   Superficial phlebitis 09/03/2019   Pain of right thumb 01/06/2019   Sprain  of second toe, left, initial encounter 12/18/2017   Acute upper respiratory infection 01/04/2017   Greater trochanteric bursitis of left hip 12/19/2015   Right otitis media 12/16/2015   Shoulder dislocation 08/03/2015   Anxiety state 07/01/2015   Cellulitis 04/01/2015   Osteoarthritis of left ankle 04/01/2015   Bilateral leg pain 02/15/2015   PVD (peripheral vascular disease) (HCC) 12/22/2014   Cellulitis of right lower leg 12/13/2014   Right leg pain 11/10/2014   Hx of colonic polyps 09/08/2013   Obesity (BMI 30-39.9) 09/08/2013   Impingement syndrome of left shoulder 06/04/2012   Mild aortic stenosis 12/05/2011   Asthma 12/05/2011   Renal cyst 07/05/2011   Low back pain 06/28/2011   GERD (gastroesophageal reflux disease) 06/28/2011   Carpal tunnel syndrome of right wrist 04/22/2011   Right foot pain 05/23/2010   Encounter for well adult exam with abnormal findings 05/23/2010   GENITAL HERPES 03/24/2010   Hyperlipidemia 03/24/2010   Nocturia 03/24/2010   LIPOMA 12/06/2009   ANEMIA-IRON DEFICIENCY 12/06/2009   CONJUNCTIVITIS, BILATERAL 12/06/2009   DIVERTICULOSIS, COLON 12/06/2009   HYPOTHYROIDISM 09/17/2006   Diabetes (HCC) 09/17/2006   Essential hypertension 09/17/2006    PCP: Corwin Levins, MD   REFERRING PROVIDER: Richardean Sale, DO  REFERRING DIAG: [phone] (ICD-10-CM) - Chronic pain of left ankle M19.172 (ICD-10-CM) - Post-traumatic osteoarthritis of left ankle  THERAPY DIAG:  Pain in left ankle and  joints of left foot  Muscle weakness (generalized)  Stiffness of left ankle, not elsewhere classified  Cramp and spasm  Difficulty in walking, not elsewhere classified  Rationale for Evaluation and Treatment: Rehabilitation  ONSET DATE: 08/02/2022  SUBJECTIVE:   SUBJECTIVE STATEMENT: Patient reports no pain.  No soreness after last session.     PERTINENT HISTORY: ORIF left ankle 5 years ago PAIN:  09/05/22: Are you having pain? Yes: NPRS  scale: 0/10 Pain location: left ankle, medial side and R forefoot Pain description: aching, "feels like when you twist your wrist"   Aggravating factors: "when I move it the wrong way", unclear  Relieving factors: "green oil", icy hot, "rubbing it down"   PRECAUTIONS: None  WEIGHT BEARING RESTRICTIONS: No  FALLS:  Has patient fallen in last 6 months? No  LIVING ENVIRONMENT: Lives with: lives alone Lives in: House/apartment Stairs: Yes: External: 3 steps; on right going up Has following equipment at home: Single point cane  OCCUPATION: retired  PLOF: Independent, Independent with household mobility without device, Independent with community mobility without device, Independent with homemaking with ambulation, Independent with gait, and Independent with transfers  PATIENT GOALS: To keep my left ankle from flaring up and giving me trouble  NEXT MD VISIT: prn  OBJECTIVE:   DIAGNOSTIC FINDINGS: na  PATIENT SURVEYS:  LEFS: 12/80  COGNITION: Overall cognitive status: Within functional limits for tasks assessed     SENSATION: WFL   POSTURE:  slightly fwd bent with min knee extension and short step length  PALPATION: Tender at peroneal tendon area  LOWER EXTREMITY ROM:  Active ROM Right eval Left eval  Ankle dorsiflexion  Neutral (0)  Ankle plantarflexion  20  Ankle inversion  20  Ankle eversion  5   (Blank rows = not tested)  LOWER EXTREMITY MMT:  MMT Right eval Left eval  Ankle dorsiflexion  5  Ankle plantarflexion  5-  Ankle inversion  4  Ankle eversion  3+   (Blank rows = not tested)  LOWER EXTREMITY SPECIAL TESTS:  NA  FUNCTIONAL TESTS:  5 times sit to stand: 14.86 sec Timed up and go (TUG): 10.93 sec  GAIT: Distance walked: 30 Assistive device utilized: Single point cane and None Level of assistance: Modified independence Comments: Typically does not use any a.d but just bought a cane.  I have it in case I need it.     TODAY'S TREATMENT:                                                                                                                               DATE:  09/11/22 TherEx Nustep L5 x 5 minutes BLEs  Rocker board x 2 min Plantar fascia stretches on prostretch 5 times, 10 seconds left  Single leg inversion/eversion x 20 on half roll  TherAct Step ups on 4" step (due to knee issues) 2 x 10 leading with left LE Retro step 2 x 10 on 4"  step Sit to stand x 10 with normal stance, then x 10 with staggered stand (left foot in back)  NMR On purple foam pad: Step up and hold x 10 fwd then x 10 lateral (left) On purple foam pad: Marching x 20 Alternating cone taps x 20 SLS floor with cone touches (cone at waist level) x 10   09/07/22 TherEx Nustep L5 x 5 minutes BLEs  Rocker board x 2 min Plantar fascia stretches on prostretch 5 times, 10 seconds left  Single leg inversion/eversion x 20 on half roll  TherAct Step ups on 6" step 2 x 10 leading with left LE Retro step 2 x 10 on 4" step Sit to stand x 10 with normal stance, then x 10 with staggered stand (left foot in back)  NMR On purple foam pad: Step up and hold x 10 fwd then x 10 lateral (left) On purple foam pad: Marching x 20 Alternating cone taps x 20 SLS floor with cone touches (cone at waist level) x 10  09/05/22 TherEx Nustep L3 x 5 minutes BLEs  Rocker board x 2 min Gastroc stretch on rocker board 3 times 30 sec Soleus stretch on rocker board 3 times 30 Plantar fascia stretches on prostretch 5 times, 10 seconds left  Single leg inversion/eversion x 20 on half roll  NMR On purple foam pad: Step up and hold x 10 fwd then x 10 lateral (left) On purple foam pad: Marching x 20 Alternating cone taps x 20 SLS floor with heavy vc's for weight shift and balance strategies 10 attempts at 5 sec each    08/29/22 TherEx Nustep L3 x 5 minutes BLEs  Gastroc stretch on rocker board 10 times, 10 seconds both  Soleus stretch on rocker board 10 times, 10  seconds left  Plantar fascia stretches on prostretch 10 times, 10 seconds left  Rocker board x 2 min Single leg inversion/eversion x 20 on half roll Seated toe and heel raises x 20  NMR On purple foam pad: Step up and hold x 10 fwd then x 10 lateral (left) On purple foam pad: Marching x 20 Alternating cone taps x 20 SLS with one foot on 6 inch step 3x30 seconds B  Seated BAPS using circle board x 20 DF/PF, then x 20 cw/ccw    08/24/22 TherEx Nustep L3 x6 minutes BLEs  Gastroc stretch on door 2x30 seconds B  Plantar fascia stretches off step 2x30 seconds B Ankle PF/DF on rocker board x20 BLEs Single leg inversion/eversion x10 B on rocker board  NMR On purple foam pad: Tandem stance 3x30 seconds B  SLS with one foot on 6 inch step 3x30 seconds B  Narrow BOS 3x60 seconds  Double step tap x6 B   08/09/22 Initiated HEP, Initial eval completed   PATIENT EDUCATION:  Education details: Initiated HEP Person educated: Patient Education method: Programmer, multimedia, Facilities manager, Verbal cues, and Handouts Education comprehension: verbalized understanding and returned demonstration  HOME EXERCISE PROGRAM: Access Code: [email or web address] URL: [email or web address] Date: 08/09/2022 Prepared by: Mikey Kirschner  Exercises - Seated Ankle Alphabet  - 1 x daily - 7 x weekly - 3 sets - 10 reps - Long Sitting Calf Stretch with Strap  - 1 x daily - 7 x weekly - 3 sets - 10 reps - Seated Ankle Inversion Eversion PROM  - 1 x daily - 7 x weekly - 3 sets - 10 reps - Seated Heel Raise  - 1 x daily - 7 x weekly - 3 sets -  10 reps - Seated Toe Raise  - 1 x daily - 7 x weekly - 3 sets - 10 reps  ASSESSMENT:  CLINICAL IMPRESSION: Medha is doing very well.  She had minimal pain with all tasks today.  Knee pain was minimal with lowering the step height for step ups.    She is well motivated and compliant.  She should continue to improve.  She would benefit from continued skilled PT for ROM,  stability and strengthening.    OBJECTIVE IMPAIRMENTS: Abnormal gait, decreased balance, decreased mobility, difficulty walking, decreased ROM, decreased strength, hypomobility, increased edema, increased fascial restrictions, increased muscle spasms, impaired flexibility, and pain.   ACTIVITY LIMITATIONS: carrying, lifting, bending, standing, squatting, stairs, transfers, dressing, and locomotion level  PARTICIPATION LIMITATIONS: meal prep, cleaning, laundry, driving, shopping, community activity, yard work, and church  PERSONAL FACTORS: Fitness and 1-2 comorbidities: Diabetes, htn  are also affecting patient's functional outcome.   REHAB POTENTIAL: Good  CLINICAL DECISION MAKING: Evolving/moderate complexity  EVALUATION COMPLEXITY: Moderate   GOALS: Goals reviewed with patient? Yes  SHORT TERM GOALS: Target date: 09/06/2022  Pain report to be no greater than 4/10  Baseline: Goal status: MET 09/05/22  2.  Patient will be independent with initial HEP  Baseline:  Goal status: MET 09/05/22   LONG TERM GOALS: Target date: 10/04/2022   Patient to report pain no greater than 2/10  Baseline:  Goal status: INITIAL  2.  Patient to be independent with advanced HEP  Baseline:  Goal status: INITIAL  3.  Patient to improve by 2-3 seconds on 5 times sit to stand and TUG Baseline:  Goal status: INITIAL  4.  Ankle DF to improve to 10-15 degrees Baseline:  Goal status: INITIAL  5.  Patient to be able to ascend and descend steps with reciprocal gait Baseline:  Goal status: INITIAL  6.  LEFS score to improve by 2 points Baseline:  Goal status: INITIAL   PLAN:  PT FREQUENCY: 1-2x/week  PT DURATION: 8 weeks  PLANNED INTERVENTIONS: Therapeutic exercises, Therapeutic activity, Neuromuscular re-education, Balance training, Gait training, Patient/Family education, Self Care, Joint mobilization, Stair training, Orthotic/Fit training, DME instructions, Aquatic Therapy, Dry Needling,  Electrical stimulation, Cryotherapy, Moist heat, Compression bandaging, scar mobilization, Splintting, Taping, Vasopneumatic device, Ultrasound, Ionotophoresis 4mg /ml Dexamethasone, Manual therapy, and Re-evaluation  PLAN FOR NEXT SESSION: Continue weight shifting and WB training, PROM and ankle mobility, progress ankle stability training, Nustep.   Victorino Dike B. Thailyn Khalid, PT 09/12/22 9:28 AM  [facility] [address], [address] [place], [place] [address] Phone # [phone] Fax [phone]

## 2022-09-14 ENCOUNTER — Encounter: Payer: 59 | Attending: Internal Medicine | Admitting: Dietician

## 2022-09-14 ENCOUNTER — Ambulatory Visit: Payer: 59

## 2022-09-14 VITALS — Ht 59.0 in | Wt 186.0 lb

## 2022-09-14 DIAGNOSIS — M25672 Stiffness of left ankle, not elsewhere classified: Secondary | ICD-10-CM | POA: Diagnosis not present

## 2022-09-14 DIAGNOSIS — R252 Cramp and spasm: Secondary | ICD-10-CM

## 2022-09-14 DIAGNOSIS — R262 Difficulty in walking, not elsewhere classified: Secondary | ICD-10-CM | POA: Diagnosis not present

## 2022-09-14 DIAGNOSIS — E119 Type 2 diabetes mellitus without complications: Secondary | ICD-10-CM | POA: Diagnosis not present

## 2022-09-14 DIAGNOSIS — M25572 Pain in left ankle and joints of left foot: Secondary | ICD-10-CM

## 2022-09-14 DIAGNOSIS — M6281 Muscle weakness (generalized): Secondary | ICD-10-CM

## 2022-09-14 NOTE — Patient Instructions (Signed)
Eat more Non-Starchy Vegetables These include greens, broccoli, cauliflower, cabbage, carrots, beets, eggplant, peppers, squash and others. Minimize added sugars and refined grains Rethink what you drink.  Choose beverages without added sugar.  Look for 0 carbs on the label. See the list of whole grains below.  Find alternatives to usual sweet treats. Choose whole foods over processed. Make simple meals at home more often than eating out.  Tips to increase fiber in your diet: (All plants have fiber.  Eat a variety. There are more than are on this list.) Slowly increase the amount of fiber you eat to 25-35 grams per day.  (More is fine if you tolerate it.) Fiber from whole grains, nuts and seeds Quinoa, 1/2 cup = 5 grams Bulgur, 1/2 cup = 4.1 grams Popcorn, 3 cups = 3.6 grams Whole Wheat Spaghetti, 1/2 cup = 3.2 grams Barley, 1/2 cup = 3 grams Oatmeal, 1/2 cup = 2 grams Whole Wheat English Muffin = 3 grams Corn, 1/2 cup = 2.1 grams Brown Rice, 1/2 cup = 1.8 grams Flax seeds, 1 Tablespoon = 2.8 grams Chia seeds, 1 Tablespoon = 11 grams Almonds, 1 ounce = 3.5 grams fiber Fiber from legumes Kidney beans, 1/2 cup 7.9 grams Lentils, 1/2 cup = 7.8 grams Pinto beans, 1/2 cup = 7.7 grams Black beans, 1/2 cup = 7.6 grams Lima beans, 1/2 cup 6.4 grams Chick peas, 1/2 cup = 5.3 grams Black eyed peas, 1/2 cup = 4 grams Fiber from fruits and vegetables Pear, 6 grams Apple. 3.3 grams Raspberries or Blackberries, 3/4 cup = 6 grams Strawberries or Blueberries, 1 cup = 3.4 grams Baked sweet potato 3.8 grams fiber Baked potato with skin 4.4 grams  Peas, 1/2 cup = 4.4 grams  Spinach, 1/2 cup cooked = 3.5 grams  Avocado, 1/2 = 5 grams       

## 2022-09-14 NOTE — Progress Notes (Signed)
  Medical Nutrition Therapy  Appointment Start time:  [phone] (late)  Appointment End time:  1025 Patient is here today alone.  Primary concerns today: She states that she does not like taking her cholesterol medication as it makes her hurt and wants to learn more about lifestyle to help her cholesterol and overall health.  Referral diagnosis: Type 2 Diabetes, HLD, HTN Preferred learning style:  no preference indicated Learning readiness:  ready   NUTRITION ASSESSMENT   Anthropometrics  49" 186 lbs 09/14/2022  Wt Readings from Last 3 Encounters:  09/14/22 186 lb (84.4 kg)  09/03/22 192 lb (87.1 kg)  08/02/22 189 lb (85.7 kg)   Clinical Medical Hx: Type 2  Diabetes, HTN, HLD Medications: Actos, lasix, amlodipine, losartan Labs:  A1C 6% 01/11/2022, vitamin D 33 01/11/2022, Vitamin B-12 1421 01/11/2022    Component Value Date/Time   CHOL 182 01/11/2022 0854   TRIG 72.0 01/11/2022 0854   HDL 56.60 01/11/2022 0854   CHOLHDL 3 01/11/2022 0854   VLDL 14.4 01/11/2022 0854   LDLCALC 111 (H) 01/11/2022 0854   LDLDIRECT 134.6 05/23/2010 0937   Notable Signs/Symptoms: Fasting blood glucose 98  Lifestyle & Dietary Hx Patient lives with her dog. She states that she tries to eat healthy and is taking herbs. Cooks for herself and does not eat out much.  Avoids red meat.  Limits bread.  Supplements: MVI, vitamin B-12, Vitamin D, apple cider vinegar gummy, cranberry probiotic Sleep:  10 hours - good sleep   Current average weekly physical activity: cleans her own home, has a treadmill   24-Hr Dietary Recall First Meal: Cinnamon crunch or honey nut cheerios cereal with the lactaid whole milk OR pinto beans and cornbread Snack: occasional cereal bar Second Meal: potato soup, broccoli, spaghetti, chicken breast Snack: 2 very small ginger snap cookies Third Meal: leftovers from lunch with marinara Snack: none Beverages: water, V-8 - fruit juice, Gatorade, coffee with low sugar creamer,  black tea with 1 T sugar or sweetened ginger tea with, occasional cherry pepsi  NUTRITION DIAGNOSIS  NB-1.1 Food and nutrition-related knowledge deficit As related to balance of carbohydrates, protein, and fat.  As evidenced by diet hx and patient report.   NUTRITION INTERVENTION  Nutrition education (E-1) on the following topics:  Benefits of increased fiber and sources Discussed cholesterol and dietary impact Encouraged a continued active lifestyle  Handouts Provided Include  Cholesterol and triglycerides Foods to choose to lower your cholesterol [employer] of Lifestyle Medicine) packet  Learning Style & Readiness for Change Teaching method utilized: Visual & Auditory  Demonstrated degree of understanding via: Teach Back  Barriers to learning/adherence to lifestyle change:   Goals Established by Pt Increase vegetables Small portions meat Add meatless meals Continue to stay active  MONITORING & EVALUATION Dietary intake, weekly physical activity prn.  Next Steps  Patient is to call for questions.

## 2022-09-17 NOTE — Therapy (Signed)
OUTPATIENT PHYSICAL THERAPY LOWER EXTREMITY TREATMENT   Patient Name: Brandi Shaffer MRN: [id number] DOB:07/18/1945, 77 y.o., female Today's Date: 09/17/2022  END OF SESSION:      Past Medical History:  Diagnosis Date   ANEMIA-IRON DEFICIENCY 12/06/2009   Anxiety state 07/01/2015   Asthma 12/05/2011   DIABETES MELLITUS, TYPE II 09/17/2006   DIVERTICULOSIS, COLON 12/06/2009   GENITAL HERPES 03/24/2010   GERD (gastroesophageal reflux disease) 06/28/2011   HYPERLIPIDEMIA 03/24/2010   HYPERTENSION 09/17/2006   HYPOTHYROIDISM 09/17/2006   LIPOMA 12/06/2009   PERIPHERAL EDEMA 03/24/2010   Renal cyst 07/05/2011   1.9 cm minimally complex   Past Surgical History:  Procedure Laterality Date   ABDOMINAL HYSTERECTOMY     fibroids   ankle surgury     x 3 left - chronic pain/swelling   COLONOSCOPY  2011   SHOULDER SURGERY Left    TONSILLECTOMY     TUBAL LIGATION     Patient Active Problem List   Diagnosis Date Noted   Multiple skin nodules 06/28/2022   Dental infection 04/05/2022   Peripheral edema 03/26/2022   Leg length discrepancy 09/28/2020   Greater trochanteric bursitis, right 08/09/2020   Posterior vitreous detachment of right eye 02/18/2020   Posterior vitreous detachment of left eye 02/18/2020   Diabetes mellitus without complication (HCC) 02/18/2020   Nuclear sclerotic cataract of both eyes 02/18/2020   Urinary frequency 01/11/2020   Pelvic pain 01/11/2020   Vitamin D deficiency 01/11/2020   B12 deficiency 01/11/2020   Superficial phlebitis 09/03/2019   Pain of right thumb 01/06/2019   Sprain of second toe, left, initial encounter 12/18/2017   Acute upper respiratory infection 01/04/2017   Greater trochanteric bursitis of left hip 12/19/2015   Right otitis media 12/16/2015   Shoulder dislocation 08/03/2015   Anxiety state 07/01/2015   Cellulitis 04/01/2015   Osteoarthritis of left ankle 04/01/2015   Bilateral leg pain 02/15/2015   PVD (peripheral vascular disease)  (HCC) 12/22/2014   Cellulitis of right lower leg 12/13/2014   Right leg pain 11/10/2014   Hx of colonic polyps 09/08/2013   Obesity (BMI 30-39.9) 09/08/2013   Impingement syndrome of left shoulder 06/04/2012   Mild aortic stenosis 12/05/2011   Asthma 12/05/2011   Renal cyst 07/05/2011   Low back pain 06/28/2011   GERD (gastroesophageal reflux disease) 06/28/2011   Carpal tunnel syndrome of right wrist 04/22/2011   Right foot pain 05/23/2010   Encounter for well adult exam with abnormal findings 05/23/2010   GENITAL HERPES 03/24/2010   Hyperlipidemia 03/24/2010   Nocturia 03/24/2010   LIPOMA 12/06/2009   ANEMIA-IRON DEFICIENCY 12/06/2009   CONJUNCTIVITIS, BILATERAL 12/06/2009   DIVERTICULOSIS, COLON 12/06/2009   HYPOTHYROIDISM 09/17/2006   Diabetes (HCC) 09/17/2006   Essential hypertension 09/17/2006    PCP: Corwin Levins, MD   REFERRING PROVIDER: Richardean Sale, DO  REFERRING DIAG: [phone] (ICD-10-CM) - Chronic pain of left ankle M19.172 (ICD-10-CM) - Post-traumatic osteoarthritis of left ankle  THERAPY DIAG:  Pain in left ankle and joints of left foot  Stiffness of left ankle, not elsewhere classified  Muscle weakness (generalized)  Cramp and spasm  Difficulty in walking, not elsewhere classified  Rationale for Evaluation and Treatment: Rehabilitation  ONSET DATE: 08/02/2022  SUBJECTIVE:   SUBJECTIVE STATEMENT: Patient reports she has very little pain.  Feeling stronger.  Does get some discomfort when trying to do SLS tasks on HEP.      PERTINENT HISTORY: ORIF left ankle 5 years ago PAIN:  09/14/22: Are you  having pain? Yes: NPRS scale: 0/10 Pain location: left ankle, medial side and R forefoot Pain description: aching, "feels like when you twist your wrist"   Aggravating factors: "when I move it the wrong way", unclear  Relieving factors: "green oil", icy hot, "rubbing it down"   PRECAUTIONS: None  WEIGHT BEARING RESTRICTIONS: No  FALLS:   Has patient fallen in last 6 months? No  LIVING ENVIRONMENT: Lives with: lives alone Lives in: House/apartment Stairs: Yes: External: 3 steps; on right going up Has following equipment at home: Single point cane  OCCUPATION: retired  PLOF: Independent, Independent with household mobility without device, Independent with community mobility without device, Independent with homemaking with ambulation, Independent with gait, and Independent with transfers  PATIENT GOALS: To keep my left ankle from flaring up and giving me trouble  NEXT MD VISIT: prn  OBJECTIVE:   DIAGNOSTIC FINDINGS: na  PATIENT SURVEYS:  LEFS: 12/80  COGNITION: Overall cognitive status: Within functional limits for tasks assessed     SENSATION: WFL   POSTURE:  slightly fwd bent with min knee extension and short step length  PALPATION: Tender at peroneal tendon area  LOWER EXTREMITY ROM:  Active ROM Right eval Left eval  Ankle dorsiflexion  Neutral (0)  Ankle plantarflexion  20  Ankle inversion  20  Ankle eversion  5   (Blank rows = not tested)  LOWER EXTREMITY MMT:  MMT Right eval Left eval  Ankle dorsiflexion  5  Ankle plantarflexion  5-  Ankle inversion  4  Ankle eversion  3+   (Blank rows = not tested)  LOWER EXTREMITY SPECIAL TESTS:  NA  FUNCTIONAL TESTS:  5 times sit to stand: 14.86 sec Timed up and go (TUG): 10.93 sec  GAIT: Distance walked: 30 Assistive device utilized: Single point cane and None Level of assistance: Modified independence Comments: Typically does not use any a.d but just bought a cane.  I have it in case I need it.     TODAY'S TREATMENT:                                                                                                                              DATE:  09/14/22 TherEx Nustep L5 x 5 minutes BLEs  Rocker board x 2 min Gastroc and soleus stretches on rocker board propped 5 times, 10 seconds left  Single leg inversion/eversion x 20 on half  roll  TherAct Step ups on 4" step (due to knee issues) 2 x 10 leading with left LE Retro step 2 x 10 on 4" step Sit to stand x 10 with normal stance, then x 10 with staggered stand (left foot in back)  NMR On purple foam pad: Step up and hold x 10 fwd then x 10 lateral (left) On purple foam pad: Marching x 20 Alternating cone taps x 20 Lunge to BOSU both x 10 each fwd then lateral SLS floor with cone touches (cone at waist level)  x 10  09/11/22 TherEx Nustep L5 x 5 minutes BLEs  Rocker board x 2 min Plantar fascia stretches on prostretch 5 times, 10 seconds left  Single leg inversion/eversion x 20 on half roll  TherAct Step ups on 4" step (due to knee issues) 2 x 10 leading with left LE Retro step 2 x 10 on 4" step Sit to stand x 10 with normal stance, then x 10 with staggered stand (left foot in back)  NMR On purple foam pad: Step up and hold x 10 fwd then x 10 lateral (left) On purple foam pad: Marching x 20 Alternating cone taps x 20 SLS floor with cone touches (cone at waist level) x 10   09/07/22 TherEx Nustep L5 x 5 minutes BLEs  Rocker board x 2 min Plantar fascia stretches on prostretch 5 times, 10 seconds left  Single leg inversion/eversion x 20 on half roll  TherAct Step ups on 6" step 2 x 10 leading with left LE Retro step 2 x 10 on 4" step Sit to stand x 10 with normal stance, then x 10 with staggered stand (left foot in back)  NMR On purple foam pad: Step up and hold x 10 fwd then x 10 lateral (left) On purple foam pad: Marching x 20 Alternating cone taps x 20 SLS floor with cone touches (cone at waist level) x 10   08/09/22 Initiated HEP, Initial eval completed   PATIENT EDUCATION:  Education details: Initiated HEP Person educated: Patient Education method: Programmer, multimedia, Facilities manager, Verbal cues, and Handouts Education comprehension: verbalized understanding and returned demonstration  HOME EXERCISE PROGRAM: Access Code: [email or web address] URL:  [email or web address] Date: 08/09/2022 Prepared by: Mikey Kirschner  Exercises - Seated Ankle Alphabet  - 1 x daily - 7 x weekly - 3 sets - 10 reps - Long Sitting Calf Stretch with Strap  - 1 x daily - 7 x weekly - 3 sets - 10 reps - Seated Ankle Inversion Eversion PROM  - 1 x daily - 7 x weekly - 3 sets - 10 reps - Seated Heel Raise  - 1 x daily - 7 x weekly - 3 sets - 10 reps - Seated Toe Raise  - 1 x daily - 7 x weekly - 3 sets - 10 reps  ASSESSMENT:  CLINICAL IMPRESSION: Kaoru is progressing appropriately.  She has little pain to speak of even with higher level single leg stance activities.  Her gait is normalizing.   She should continue to improve.  She would benefit from continued skilled PT for ROM, stability and strengthening.    OBJECTIVE IMPAIRMENTS: Abnormal gait, decreased balance, decreased mobility, difficulty walking, decreased ROM, decreased strength, hypomobility, increased edema, increased fascial restrictions, increased muscle spasms, impaired flexibility, and pain.   ACTIVITY LIMITATIONS: carrying, lifting, bending, standing, squatting, stairs, transfers, dressing, and locomotion level  PARTICIPATION LIMITATIONS: meal prep, cleaning, laundry, driving, shopping, community activity, yard work, and church  PERSONAL FACTORS: Fitness and 1-2 comorbidities: Diabetes, htn  are also affecting patient's functional outcome.   REHAB POTENTIAL: Good  CLINICAL DECISION MAKING: Evolving/moderate complexity  EVALUATION COMPLEXITY: Moderate   GOALS: Goals reviewed with patient? Yes  SHORT TERM GOALS: Target date: 09/06/2022  Pain report to be no greater than 4/10  Baseline: Goal status: MET 09/05/22  2.  Patient will be independent with initial HEP  Baseline:  Goal status: MET 09/05/22   LONG TERM GOALS: Target date: 10/04/2022   Patient to report pain no greater than 2/10  Baseline:  Goal  status: MET 09/14/22  2.  Patient to be independent with  advanced HEP  Baseline:  Goal status: IN PROGRESS  3.  Patient to improve by 2-3 seconds on 5 times sit to stand and TUG Baseline:  Goal status: INITIAL  4.  Ankle DF to improve to 10-15 degrees Baseline:  Goal status: INITIAL  5.  Patient to be able to ascend and descend steps with reciprocal gait Baseline:  Goal status: IN PROGRESS  6.  LEFS score to improve by 2 points Baseline:  Goal status: INITIAL   PLAN:  PT FREQUENCY: 1-2x/week  PT DURATION: 8 weeks  PLANNED INTERVENTIONS: Therapeutic exercises, Therapeutic activity, Neuromuscular re-education, Balance training, Gait training, Patient/Family education, Self Care, Joint mobilization, Stair training, Orthotic/Fit training, DME instructions, Aquatic Therapy, Dry Needling, Electrical stimulation, Cryotherapy, Moist heat, Compression bandaging, scar mobilization, Splintting, Taping, Vasopneumatic device, Ultrasound, Ionotophoresis 4mg /ml Dexamethasone, Manual therapy, and Re-evaluation  PLAN FOR NEXT SESSION: PROM and ankle mobility, progress ankle stability training, Nustep.   Victorino Dike B. Martese Vanatta, PT 09/17/22 9:44 AM  [facility] [address], [address] [place], [place] [address] Phone # [phone] Fax [phone]

## 2022-09-19 ENCOUNTER — Ambulatory Visit: Payer: 59

## 2022-09-19 DIAGNOSIS — R262 Difficulty in walking, not elsewhere classified: Secondary | ICD-10-CM | POA: Diagnosis not present

## 2022-09-19 DIAGNOSIS — R252 Cramp and spasm: Secondary | ICD-10-CM

## 2022-09-19 DIAGNOSIS — M25672 Stiffness of left ankle, not elsewhere classified: Secondary | ICD-10-CM | POA: Diagnosis not present

## 2022-09-19 DIAGNOSIS — M25572 Pain in left ankle and joints of left foot: Secondary | ICD-10-CM

## 2022-09-19 DIAGNOSIS — M6281 Muscle weakness (generalized): Secondary | ICD-10-CM

## 2022-09-19 NOTE — Therapy (Signed)
OUTPATIENT PHYSICAL THERAPY LOWER EXTREMITY TREATMENT   Patient Name: Brandi Shaffer MRN: [id number] DOB:Oct 19, 1945, 77 y.o., female Today's Date: 09/19/2022  END OF SESSION:  PT End of Session - 09/19/22 0855     Visit Number 9    Date for PT Re-Evaluation 10/04/22    Authorization Type [employer] MEDICARE [employer] DUAL COMPLETE    PT Start Time [phone]    PT Stop Time 0930    PT Time Calculation (min) 43 min    Activity Tolerance Patient tolerated treatment well    Behavior During Therapy [facility] for tasks assessed/performed                Past Medical History:  Diagnosis Date   ANEMIA-IRON DEFICIENCY 12/06/2009   Anxiety state 07/01/2015   Asthma 12/05/2011   DIABETES MELLITUS, TYPE II 09/17/2006   DIVERTICULOSIS, COLON 12/06/2009   GENITAL HERPES 03/24/2010   GERD (gastroesophageal reflux disease) 06/28/2011   HYPERLIPIDEMIA 03/24/2010   HYPERTENSION 09/17/2006   HYPOTHYROIDISM 09/17/2006   LIPOMA 12/06/2009   PERIPHERAL EDEMA 03/24/2010   Renal cyst 07/05/2011   1.9 cm minimally complex   Past Surgical History:  Procedure Laterality Date   ABDOMINAL HYSTERECTOMY     fibroids   ankle surgury     x 3 left - chronic pain/swelling   COLONOSCOPY  2011   SHOULDER SURGERY Left    TONSILLECTOMY     TUBAL LIGATION     Patient Active Problem List   Diagnosis Date Noted   Multiple skin nodules 06/28/2022   Dental infection 04/05/2022   Peripheral edema 03/26/2022   Leg length discrepancy 09/28/2020   Greater trochanteric bursitis, right 08/09/2020   Posterior vitreous detachment of right eye 02/18/2020   Posterior vitreous detachment of left eye 02/18/2020   Diabetes mellitus without complication (HCC) 02/18/2020   Nuclear sclerotic cataract of both eyes 02/18/2020   Urinary frequency 01/11/2020   Pelvic pain 01/11/2020   Vitamin D deficiency 01/11/2020   B12 deficiency 01/11/2020   Superficial phlebitis 09/03/2019   Pain of right thumb 01/06/2019    Sprain of second toe, left, initial encounter 12/18/2017   Acute upper respiratory infection 01/04/2017   Greater trochanteric bursitis of left hip 12/19/2015   Right otitis media 12/16/2015   Shoulder dislocation 08/03/2015   Anxiety state 07/01/2015   Cellulitis 04/01/2015   Osteoarthritis of left ankle 04/01/2015   Bilateral leg pain 02/15/2015   PVD (peripheral vascular disease) (HCC) 12/22/2014   Cellulitis of right lower leg 12/13/2014   Right leg pain 11/10/2014   Hx of colonic polyps 09/08/2013   Obesity (BMI 30-39.9) 09/08/2013   Impingement syndrome of left shoulder 06/04/2012   Mild aortic stenosis 12/05/2011   Asthma 12/05/2011   Renal cyst 07/05/2011   Low back pain 06/28/2011   GERD (gastroesophageal reflux disease) 06/28/2011   Carpal tunnel syndrome of right wrist 04/22/2011   Right foot pain 05/23/2010   Encounter for well adult exam with abnormal findings 05/23/2010   GENITAL HERPES 03/24/2010   Hyperlipidemia 03/24/2010   Nocturia 03/24/2010   LIPOMA 12/06/2009   ANEMIA-IRON DEFICIENCY 12/06/2009   CONJUNCTIVITIS, BILATERAL 12/06/2009   DIVERTICULOSIS, COLON 12/06/2009   HYPOTHYROIDISM 09/17/2006   Diabetes (HCC) 09/17/2006   Essential hypertension 09/17/2006    PCP: Corwin Levins, MD   REFERRING PROVIDER: Richardean Sale, DO  REFERRING DIAG: [phone] (ICD-10-CM) - Chronic pain of left ankle M19.172 (ICD-10-CM) - Post-traumatic osteoarthritis of left ankle  THERAPY DIAG:  Pain in left ankle  and joints of left foot  Muscle weakness (generalized)  Cramp and spasm  Difficulty in walking, not elsewhere classified  Stiffness of left ankle, not elsewhere classified  Rationale for Evaluation and Treatment: Rehabilitation  ONSET DATE: 08/02/2022  SUBJECTIVE:   SUBJECTIVE STATEMENT: Patient reports she continues to feel better.       PERTINENT HISTORY: ORIF left ankle 5 years ago PAIN:  09/19/22: Are you having pain? Yes: NPRS scale:  0/10 Pain location: left ankle, medial side and R forefoot Pain description: aching, "feels like when you twist your wrist"   Aggravating factors: "when I move it the wrong way", unclear  Relieving factors: "green oil", icy hot, "rubbing it down"   PRECAUTIONS: None  WEIGHT BEARING RESTRICTIONS: No  FALLS:  Has patient fallen in last 6 months? No  LIVING ENVIRONMENT: Lives with: lives alone Lives in: House/apartment Stairs: Yes: External: 3 steps; on right going up Has following equipment at home: Single point cane  OCCUPATION: retired  PLOF: Independent, Independent with household mobility without device, Independent with community mobility without device, Independent with homemaking with ambulation, Independent with gait, and Independent with transfers  PATIENT GOALS: To keep my left ankle from flaring up and giving me trouble  NEXT MD VISIT: prn  OBJECTIVE:   DIAGNOSTIC FINDINGS: na  PATIENT SURVEYS:  LEFS: 12/80  COGNITION: Overall cognitive status: Within functional limits for tasks assessed     SENSATION: WFL   POSTURE:  slightly fwd bent with min knee extension and short step length  PALPATION: Tender at peroneal tendon area  LOWER EXTREMITY ROM:  Active ROM Right eval Left eval  Ankle dorsiflexion  Neutral (0)  Ankle plantarflexion  20  Ankle inversion  20  Ankle eversion  5   (Blank rows = not tested)  LOWER EXTREMITY MMT:  MMT Right eval Left eval  Ankle dorsiflexion  5  Ankle plantarflexion  5-  Ankle inversion  4  Ankle eversion  3+   (Blank rows = not tested)  LOWER EXTREMITY SPECIAL TESTS:  NA  FUNCTIONAL TESTS:  5 times sit to stand: 14.86 sec Timed up and go (TUG): 10.93 sec  GAIT: Distance walked: 30 Assistive device utilized: Single point cane and None Level of assistance: Modified independence Comments: Typically does not use any a.d but just bought a cane.  I have it in case I need it.     TODAY'S TREATMENT:                                                                                                                               DATE:  09/19/22 TherEx Nustep L5 x 5 minutes BLEs  Rocker board x 2 min Gastroc (bilateral)  and soleus (left) stretches on rocker board propped 5 times, 10 seconds Single leg inversion/eversion x 20 on half roll  TherAct Step ups on 6" step  2 x 10 leading with left LE Retro step 2 x 10 on  4" step Sit to stand x 10 with normal stance, then x 10 with staggered stand (left foot in back)  NMR On purple foam pad: Step up and hold x 10 fwd then x 10 lateral (left) On purple foam pad: Marching x 20 Alternating cone taps x 20 SLS floor with cone touches (cone at waist level) x 10  09/14/22 TherEx Nustep L5 x 5 minutes BLEs  Rocker board x 2 min Gastroc (bilateral)  and soleus (left) stretches on rocker board propped 5 times, 10 seconds   Single leg inversion/eversion x 20 on half roll  TherAct Step ups on 4" step (due to knee issues) 2 x 10 leading with left LE Retro step 2 x 10 on 4" step Sit to stand x 10 with normal stance, then x 10 with staggered stand (left foot in back)  NMR On purple foam pad: Step up and hold x 10 fwd then x 10 lateral (left) On purple foam pad: Marching x 20 Alternating cone taps x 20 Lunge to BOSU both x 10 each fwd then lateral SLS floor with cone touches (cone at waist level) x 10  09/11/22 TherEx Nustep L5 x 5 minutes BLEs  Rocker board x 2 min Plantar fascia stretches on prostretch 5 times, 10 seconds left  Single leg inversion/eversion x 20 on half roll  TherAct Step ups on 4" step (due to knee issues) 2 x 10 leading with left LE Retro step 2 x 10 on 4" step Sit to stand x 10 with normal stance, then x 10 with staggered stand (left foot in back)  NMR On purple foam pad: Step up and hold x 10 fwd then x 10 lateral (left) On purple foam pad: Marching x 20 Alternating cone taps x 20 SLS floor with cone touches (cone at waist  level) x 10  08/09/22 Initiated HEP, Initial eval completed   PATIENT EDUCATION:  Education details: Initiated HEP Person educated: Patient Education method: Programmer, multimedia, Facilities manager, Verbal cues, and Handouts Education comprehension: verbalized understanding and returned demonstration  HOME EXERCISE PROGRAM: Access Code: [email or web address] URL: [email or web address] Date: 08/09/2022 Prepared by: Mikey Kirschner  Exercises - Seated Ankle Alphabet  - 1 x daily - 7 x weekly - 3 sets - 10 reps - Long Sitting Calf Stretch with Strap  - 1 x daily - 7 x weekly - 3 sets - 10 reps - Seated Ankle Inversion Eversion PROM  - 1 x daily - 7 x weekly - 3 sets - 10 reps - Seated Heel Raise  - 1 x daily - 7 x weekly - 3 sets - 10 reps - Seated Toe Raise  - 1 x daily - 7 x weekly - 3 sets - 10 reps  ASSESSMENT:  CLINICAL IMPRESSION: Shaquna continues to demonstrate improved tolerance to static and dynamic SLS with improved control.   No pain reported with today's activities. She should continue to improve.  She would benefit from continued skilled PT for ROM, stability and strengthening.    OBJECTIVE IMPAIRMENTS: Abnormal gait, decreased balance, decreased mobility, difficulty walking, decreased ROM, decreased strength, hypomobility, increased edema, increased fascial restrictions, increased muscle spasms, impaired flexibility, and pain.   ACTIVITY LIMITATIONS: carrying, lifting, bending, standing, squatting, stairs, transfers, dressing, and locomotion level  PARTICIPATION LIMITATIONS: meal prep, cleaning, laundry, driving, shopping, community activity, yard work, and church  PERSONAL FACTORS: Fitness and 1-2 comorbidities: Diabetes, htn  are also affecting patient's functional outcome.   REHAB POTENTIAL: Good  CLINICAL DECISION MAKING: Evolving/moderate complexity  EVALUATION COMPLEXITY: Moderate   GOALS: Goals reviewed with patient? Yes  SHORT TERM GOALS: Target date:  09/06/2022  Pain report to be no greater than 4/10  Baseline: Goal status: MET 09/05/22  2.  Patient will be independent with initial HEP  Baseline:  Goal status: MET 09/05/22   LONG TERM GOALS: Target date: 10/04/2022   Patient to report pain no greater than 2/10  Baseline:  Goal status: MET 09/14/22  2.  Patient to be independent with advanced HEP  Baseline:  Goal status: IN PROGRESS  3.  Patient to improve by 2-3 seconds on 5 times sit to stand and TUG Baseline:  Goal status: INITIAL  4.  Ankle DF to improve to 10-15 degrees Baseline:  Goal status: MET  5.  Patient to be able to ascend and descend steps with reciprocal gait Baseline:  Goal status: MET 09/19/22  6.  LEFS score to improve by 2 points Baseline:  Goal status: INITIAL   PLAN:  PT FREQUENCY: 1-2x/week  PT DURATION: 8 weeks  PLANNED INTERVENTIONS: Therapeutic exercises, Therapeutic activity, Neuromuscular re-education, Balance training, Gait training, Patient/Family education, Self Care, Joint mobilization, Stair training, Orthotic/Fit training, DME instructions, Aquatic Therapy, Dry Needling, Electrical stimulation, Cryotherapy, Moist heat, Compression bandaging, scar mobilization, Splintting, Taping, Vasopneumatic device, Ultrasound, Ionotophoresis 4mg /ml Dexamethasone, Manual therapy, and Re-evaluation  PLAN FOR NEXT SESSION: PROM and ankle mobility, progress ankle stability training, Nustep.   Victorino Dike B. Syvanna Ciolino, PT 09/19/22 9:33 AM  [facility] [address] [place], [place] [address] Phone # [phone] Fax [phone]

## 2022-09-21 ENCOUNTER — Ambulatory Visit: Payer: 59

## 2022-09-21 DIAGNOSIS — M25572 Pain in left ankle and joints of left foot: Secondary | ICD-10-CM | POA: Diagnosis not present

## 2022-09-21 DIAGNOSIS — M6281 Muscle weakness (generalized): Secondary | ICD-10-CM | POA: Diagnosis not present

## 2022-09-21 DIAGNOSIS — R252 Cramp and spasm: Secondary | ICD-10-CM

## 2022-09-21 DIAGNOSIS — R262 Difficulty in walking, not elsewhere classified: Secondary | ICD-10-CM

## 2022-09-21 DIAGNOSIS — M25672 Stiffness of left ankle, not elsewhere classified: Secondary | ICD-10-CM | POA: Diagnosis not present

## 2022-09-21 NOTE — Therapy (Signed)
OUTPATIENT PHYSICAL THERAPY LOWER EXTREMITY TREATMENT Progress Note Reporting Period 08/09/22 to 09/21/22  See note below for Objective Data and Assessment of Progress/Goals.       Patient Name: Brandi Shaffer MRN: [id number] DOB:1945-10-07, 77 y.o., female Today's Date: 09/21/2022  END OF SESSION:  PT End of Session - 09/21/22 0857     Visit Number 10    Date for PT Re-Evaluation 10/04/22    Authorization Type [employer] MEDICARE [employer] DUAL COMPLETE    PT Start Time 0845    PT Stop Time 0930    PT Time Calculation (min) 45 min    Activity Tolerance Patient tolerated treatment well    Behavior During Therapy [facility] for tasks assessed/performed                Past Medical History:  Diagnosis Date   ANEMIA-IRON DEFICIENCY 12/06/2009   Anxiety state 07/01/2015   Asthma 12/05/2011   DIABETES MELLITUS, TYPE II 09/17/2006   DIVERTICULOSIS, COLON 12/06/2009   GENITAL HERPES 03/24/2010   GERD (gastroesophageal reflux disease) 06/28/2011   HYPERLIPIDEMIA 03/24/2010   HYPERTENSION 09/17/2006   HYPOTHYROIDISM 09/17/2006   LIPOMA 12/06/2009   PERIPHERAL EDEMA 03/24/2010   Renal cyst 07/05/2011   1.9 cm minimally complex   Past Surgical History:  Procedure Laterality Date   ABDOMINAL HYSTERECTOMY     fibroids   ankle surgury     x 3 left - chronic pain/swelling   COLONOSCOPY  2011   SHOULDER SURGERY Left    TONSILLECTOMY     TUBAL LIGATION     Patient Active Problem List   Diagnosis Date Noted   Multiple skin nodules 06/28/2022   Dental infection 04/05/2022   Peripheral edema 03/26/2022   Leg length discrepancy 09/28/2020   Greater trochanteric bursitis, right 08/09/2020   Posterior vitreous detachment of right eye 02/18/2020   Posterior vitreous detachment of left eye 02/18/2020   Diabetes mellitus without complication (HCC) 02/18/2020   Nuclear sclerotic cataract of both eyes 02/18/2020   Urinary frequency 01/11/2020   Pelvic pain 01/11/2020   Vitamin  D deficiency 01/11/2020   B12 deficiency 01/11/2020   Superficial phlebitis 09/03/2019   Pain of right thumb 01/06/2019   Sprain of second toe, left, initial encounter 12/18/2017   Acute upper respiratory infection 01/04/2017   Greater trochanteric bursitis of left hip 12/19/2015   Right otitis media 12/16/2015   Shoulder dislocation 08/03/2015   Anxiety state 07/01/2015   Cellulitis 04/01/2015   Osteoarthritis of left ankle 04/01/2015   Bilateral leg pain 02/15/2015   PVD (peripheral vascular disease) (HCC) 12/22/2014   Cellulitis of right lower leg 12/13/2014   Right leg pain 11/10/2014   Hx of colonic polyps 09/08/2013   Obesity (BMI 30-39.9) 09/08/2013   Impingement syndrome of left shoulder 06/04/2012   Mild aortic stenosis 12/05/2011   Asthma 12/05/2011   Renal cyst 07/05/2011   Low back pain 06/28/2011   GERD (gastroesophageal reflux disease) 06/28/2011   Carpal tunnel syndrome of right wrist 04/22/2011   Right foot pain 05/23/2010   Encounter for well adult exam with abnormal findings 05/23/2010   GENITAL HERPES 03/24/2010   Hyperlipidemia 03/24/2010   Nocturia 03/24/2010   LIPOMA 12/06/2009   ANEMIA-IRON DEFICIENCY 12/06/2009   CONJUNCTIVITIS, BILATERAL 12/06/2009   DIVERTICULOSIS, COLON 12/06/2009   HYPOTHYROIDISM 09/17/2006   Diabetes (HCC) 09/17/2006   Essential hypertension 09/17/2006    PCP: Corwin Levins, MD   REFERRING PROVIDER: Richardean Sale, DO  REFERRING DIAG: [phone] (ICD-10-CM) -  Chronic pain of left ankle M19.172 (ICD-10-CM) - Post-traumatic osteoarthritis of left ankle  THERAPY DIAG:  Pain in left ankle and joints of left foot  Muscle weakness (generalized)  Cramp and spasm  Difficulty in walking, not elsewhere classified  Stiffness of left ankle, not elsewhere classified  Rationale for Evaluation and Treatment: Rehabilitation  ONSET DATE: 08/02/2022  SUBJECTIVE:   SUBJECTIVE STATEMENT: Patient reports she is about 80%  better than when she started.     PERTINENT HISTORY: ORIF left ankle 5 years ago PAIN:  09/21/22:  3/10 mainly due to wearing lower profile shoes today.  If I am wearing my higher top shoe it would be 0/10.  Are you having pain? Yes: NPRS scale: 3/10 Pain location: left ankle, medial side and R forefoot Pain description: aching, "feels like when you twist your wrist"   Aggravating factors: "when I move it the wrong way", unclear  Relieving factors: "green oil", icy hot, "rubbing it down"   PRECAUTIONS: None  WEIGHT BEARING RESTRICTIONS: No  FALLS:  Has patient fallen in last 6 months? No  LIVING ENVIRONMENT: Lives with: lives alone Lives in: House/apartment Stairs: Yes: External: 3 steps; on right going up Has following equipment at home: Single point cane  OCCUPATION: retired  PLOF: Independent, Independent with household mobility without device, Independent with community mobility without device, Independent with homemaking with ambulation, Independent with gait, and Independent with transfers  PATIENT GOALS: To keep my left ankle from flaring up and giving me trouble  NEXT MD VISIT: prn  OBJECTIVE:   DIAGNOSTIC FINDINGS: na  PATIENT SURVEYS:  Initital eval: LEFS: 12/80  09/21/22:  LEFS: 63/80  COGNITION: Overall cognitive status: Within functional limits for tasks assessed     SENSATION: WFL   POSTURE:  slightly fwd bent with min knee extension and short step length  PALPATION: Tender at peroneal tendon area  LOWER EXTREMITY ROM:  Active ROM Right eval Left eval Left 09/21/22  Ankle dorsiflexion  Neutral (0) 10  Ankle plantarflexion  20 32  Ankle inversion  20 28  Ankle eversion  5 18   (Blank rows = not tested)  LOWER EXTREMITY MMT:  MMT Right eval Left eval Left 09/21/22  Ankle dorsiflexion  5 5  Ankle plantarflexion  5- 5  Ankle inversion  4 4+  Ankle eversion  3+ 4+   (Blank rows = not tested)  LOWER EXTREMITY SPECIAL TESTS:   NA  FUNCTIONAL TESTS:  Initial eval: 5 times sit to stand: 14.86 sec Timed up and go (TUG): 10.93 sec  09/21/22: 5 times sit to stand: 10.58 sec Timed up and go (TUG): 8.84  sec  GAIT: Distance walked: 30 Assistive device utilized: Single point cane and None Level of assistance: Modified independence Comments: Typically does not use any a.d but just bought a cane.  I have it in case I need it.     TODAY'S TREATMENT:  DATE:  09/21/22 Nustep x 5 min level 5 Eccentric heel raises x 10 10th visit re-assessment Review of HEP  09/19/22 TherEx Nustep L5 x 5 minutes BLEs  Rocker board x 2 min Gastroc (bilateral)  and soleus (left) stretches on rocker board propped 5 times, 10 seconds Single leg inversion/eversion x 20 on half roll  TherAct Step ups on 6" step  2 x 10 leading with left LE Retro step 2 x 10 on 4" step Sit to stand x 10 with normal stance, then x 10 with staggered stand (left foot in back)  NMR On purple foam pad: Step up and hold x 10 fwd then x 10 lateral (left) On purple foam pad: Marching x 20 Alternating cone taps x 20 SLS floor with cone touches (cone at waist level) x 10  09/14/22 TherEx Nustep L5 x 5 minutes BLEs  Rocker board x 2 min Gastroc (bilateral)  and soleus (left) stretches on rocker board propped 5 times, 10 seconds   Single leg inversion/eversion x 20 on half roll  TherAct Step ups on 4" step (due to knee issues) 2 x 10 leading with left LE Retro step 2 x 10 on 4" step Sit to stand x 10 with normal stance, then x 10 with staggered stand (left foot in back)  NMR On purple foam pad: Step up and hold x 10 fwd then x 10 lateral (left) On purple foam pad: Marching x 20 Alternating cone taps x 20 Lunge to BOSU both x 10 each fwd then lateral SLS floor with cone touches (cone at waist level) x  10  09/11/22 TherEx Nustep L5 x 5 minutes BLEs  Rocker board x 2 min Plantar fascia stretches on prostretch 5 times, 10 seconds left  Single leg inversion/eversion x 20 on half roll  TherAct Step ups on 4" step (due to knee issues) 2 x 10 leading with left LE Retro step 2 x 10 on 4" step Sit to stand x 10 with normal stance, then x 10 with staggered stand (left foot in back)  NMR On purple foam pad: Step up and hold x 10 fwd then x 10 lateral (left) On purple foam pad: Marching x 20 Alternating cone taps x 20 SLS floor with cone touches (cone at waist level) x 10  08/09/22 Initiated HEP, Initial eval completed   PATIENT EDUCATION:  Education details: Initiated HEP Person educated: Patient Education method: Programmer, multimedia, Facilities manager, Verbal cues, and Handouts Education comprehension: verbalized understanding and returned demonstration  HOME EXERCISE PROGRAM: Access Code: [email or web address] URL: [email or web address] Date: 08/09/2022 Prepared by: Mikey Kirschner  Exercises - Seated Ankle Alphabet  - 1 x daily - 7 x weekly - 3 sets - 10 reps - Long Sitting Calf Stretch with Strap  - 1 x daily - 7 x weekly - 3 sets - 10 reps - Seated Ankle Inversion Eversion PROM  - 1 x daily - 7 x weekly - 3 sets - 10 reps - Seated Heel Raise  - 1 x daily - 7 x weekly - 3 sets - 10 reps - Seated Toe Raise  - 1 x daily - 7 x weekly - 3 sets - 10 reps  ASSESSMENT:  CLINICAL IMPRESSION: Mayarose has met all original goals but continues to have difficulty with ascending and descending stairs and with bending, stooping and squatting. She is compliant and well motivated.   She would benefit from continued skilled PT for ROM, stability and strengthening along with focused functional training for stairs  and bending, stooping and squatting.    OBJECTIVE IMPAIRMENTS: Abnormal gait, decreased balance, decreased mobility, difficulty walking, decreased ROM, decreased strength, hypomobility, increased  edema, increased fascial restrictions, increased muscle spasms, impaired flexibility, and pain.   ACTIVITY LIMITATIONS: carrying, lifting, bending, standing, squatting, stairs, transfers, dressing, and locomotion level  PARTICIPATION LIMITATIONS: meal prep, cleaning, laundry, driving, shopping, community activity, yard work, and church  PERSONAL FACTORS: Fitness and 1-2 comorbidities: Diabetes, htn  are also affecting patient's functional outcome.   REHAB POTENTIAL: Good  CLINICAL DECISION MAKING: Evolving/moderate complexity  EVALUATION COMPLEXITY: Moderate   GOALS: Goals reviewed with patient? Yes  SHORT TERM GOALS: Target date: 09/06/2022  Pain report to be no greater than 4/10  Baseline: Goal status: MET 09/05/22  2.  Patient will be independent with initial HEP  Baseline:  Goal status: MET 09/05/22   LONG TERM GOALS: Target date: 10/04/2022   Patient to report pain no greater than 2/10  Baseline:  Goal status: MET 09/14/22  2.  Patient to be independent with advanced HEP  Baseline:  Goal status: MET 09/21/22  3.  Patient to improve by 2-3 seconds on 5 times sit to stand and TUG Baseline:  Goal status: MET 09/21/22  4.  Ankle DF to improve to 10-15 degrees Baseline:  Goal status: MET  5.  Patient to be able to ascend and descend steps with reciprocal gait Baseline:  Goal status: MET 09/19/22  6.  LEFS score to improve by 2 points Baseline:  Goal status: MET 09/21/22  7.  Patient to be able to do reciprocal gait on steps with confidence and safely.   Baseline:  She currently hesitates to visit her grandkids due to the steps. Goal status: NEW 09/21/22  8. Patient to be able to bend stoop and squat with increased ease to be able to pick up things from the floor. Baseline:  Difficulty with picking up objects from the floor while cleaning house Goal status: NEW 09/21/22   PLAN:  PT FREQUENCY: 1-2x/week  PT DURATION: 8 weeks  PLANNED INTERVENTIONS: Therapeutic  exercises, Therapeutic activity, Neuromuscular re-education, Balance training, Gait training, Patient/Family education, Self Care, Joint mobilization, Stair training, Orthotic/Fit training, DME instructions, Aquatic Therapy, Dry Needling, Electrical stimulation, Cryotherapy, Moist heat, Compression bandaging, scar mobilization, Splintting, Taping, Vasopneumatic device, Ultrasound, Ionotophoresis 4mg /ml Dexamethasone, Manual therapy, and Re-evaluation  PLAN FOR NEXT SESSION: Work on functional stepping and stairs along with functional squatting.   Victorino Dike B. Roscoe Witts, PT 09/21/22 7:41 PM [facility] [address] [place], [place] [address] Phone # [phone] Fax [phone]

## 2022-09-24 ENCOUNTER — Ambulatory Visit (INDEPENDENT_AMBULATORY_CARE_PROVIDER_SITE_OTHER): Payer: 59 | Admitting: Internal Medicine

## 2022-09-24 ENCOUNTER — Encounter: Payer: Self-pay | Admitting: Internal Medicine

## 2022-09-24 ENCOUNTER — Ambulatory Visit: Payer: 59 | Admitting: Physical Therapy

## 2022-09-24 VITALS — BP 122/72 | HR 70 | Temp 98.0°F | Ht 59.0 in | Wt 189.0 lb

## 2022-09-24 DIAGNOSIS — R252 Cramp and spasm: Secondary | ICD-10-CM | POA: Diagnosis not present

## 2022-09-24 DIAGNOSIS — E039 Hypothyroidism, unspecified: Secondary | ICD-10-CM | POA: Diagnosis not present

## 2022-09-24 DIAGNOSIS — M25672 Stiffness of left ankle, not elsewhere classified: Secondary | ICD-10-CM

## 2022-09-24 DIAGNOSIS — R262 Difficulty in walking, not elsewhere classified: Secondary | ICD-10-CM | POA: Diagnosis not present

## 2022-09-24 DIAGNOSIS — M6281 Muscle weakness (generalized): Secondary | ICD-10-CM | POA: Diagnosis not present

## 2022-09-24 DIAGNOSIS — I10 Essential (primary) hypertension: Secondary | ICD-10-CM | POA: Diagnosis not present

## 2022-09-24 DIAGNOSIS — M25572 Pain in left ankle and joints of left foot: Secondary | ICD-10-CM

## 2022-09-24 DIAGNOSIS — D509 Iron deficiency anemia, unspecified: Secondary | ICD-10-CM

## 2022-09-24 DIAGNOSIS — E538 Deficiency of other specified B group vitamins: Secondary | ICD-10-CM

## 2022-09-24 DIAGNOSIS — E1165 Type 2 diabetes mellitus with hyperglycemia: Secondary | ICD-10-CM

## 2022-09-24 DIAGNOSIS — Z0001 Encounter for general adult medical examination with abnormal findings: Secondary | ICD-10-CM

## 2022-09-24 DIAGNOSIS — E559 Vitamin D deficiency, unspecified: Secondary | ICD-10-CM

## 2022-09-24 DIAGNOSIS — E7800 Pure hypercholesterolemia, unspecified: Secondary | ICD-10-CM | POA: Diagnosis not present

## 2022-09-24 LAB — URINALYSIS, ROUTINE W REFLEX MICROSCOPIC
Bilirubin Urine: NEGATIVE
Hgb urine dipstick: NEGATIVE
Ketones, ur: NEGATIVE
Nitrite: NEGATIVE
RBC / HPF: NONE SEEN
Specific Gravity, Urine: 1.01 (ref 1.000–1.030)
Total Protein, Urine: NEGATIVE
Urine Glucose: NEGATIVE
Urobilinogen, UA: 0.2 (ref 0.0–1.0)
pH: 7 (ref 5.0–8.0)

## 2022-09-24 LAB — HEPATIC FUNCTION PANEL
ALT: 14 U/L (ref 0–35)
AST: 18 U/L (ref 0–37)
Albumin: 4.1 g/dL (ref 3.5–5.2)
Alkaline Phosphatase: 106 U/L (ref 39–117)
Bilirubin, Direct: 0.1 mg/dL (ref 0.0–0.3)
Total Bilirubin: 0.5 mg/dL (ref 0.2–1.2)
Total Protein: 6.8 g/dL (ref 6.0–8.3)

## 2022-09-24 LAB — CBC WITH DIFFERENTIAL/PLATELET
Basophils Absolute: 0 10*3/uL (ref 0.0–0.1)
Basophils Relative: 0.9 % (ref 0.0–3.0)
Eosinophils Absolute: 0.2 10*3/uL (ref 0.0–0.7)
Eosinophils Relative: 3.9 % (ref 0.0–5.0)
HCT: 39.8 % (ref 36.0–46.0)
Hemoglobin: 13 g/dL (ref 12.0–15.0)
Lymphocytes Relative: 44.4 % (ref 12.0–46.0)
Lymphs Abs: 2.2 10*3/uL (ref 0.7–4.0)
MCHC: 32.6 g/dL (ref 30.0–36.0)
MCV: 94.2 fl (ref 78.0–100.0)
Monocytes Absolute: 0.5 10*3/uL (ref 0.1–1.0)
Monocytes Relative: 9.9 % (ref 3.0–12.0)
Neutro Abs: 2 10*3/uL (ref 1.4–7.7)
Neutrophils Relative %: 40.9 % — ABNORMAL LOW (ref 43.0–77.0)
Platelets: 237 10*3/uL (ref 150.0–400.0)
RBC: 4.23 Mil/uL (ref 3.87–5.11)
RDW: 14.3 % (ref 11.5–15.5)
WBC: 4.8 10*3/uL (ref 4.0–10.5)

## 2022-09-24 LAB — IBC PANEL
Iron: 88 ug/dL (ref 42–145)
Saturation Ratios: 31 % (ref 20.0–50.0)
TIBC: 284.2 ug/dL (ref 250.0–450.0)
Transferrin: 203 mg/dL — ABNORMAL LOW (ref 212.0–360.0)

## 2022-09-24 LAB — BASIC METABOLIC PANEL WITH GFR
BUN: 15 mg/dL (ref 6–23)
CO2: 28 meq/L (ref 19–32)
Calcium: 9.2 mg/dL (ref 8.4–10.5)
Chloride: 107 meq/L (ref 96–112)
Creatinine, Ser: 0.92 mg/dL (ref 0.40–1.20)
GFR: 59.95 mL/min — ABNORMAL LOW
Glucose, Bld: 87 mg/dL (ref 70–99)
Potassium: 3.6 meq/L (ref 3.5–5.1)
Sodium: 142 meq/L (ref 135–145)

## 2022-09-24 LAB — MICROALBUMIN / CREATININE URINE RATIO
Creatinine,U: 29.6 mg/dL
Microalb Creat Ratio: 2.4 mg/g (ref 0.0–30.0)
Microalb, Ur: <0.7 mg/dL (ref 0.0–1.9)

## 2022-09-24 LAB — FERRITIN: Ferritin: 50.1 ng/mL (ref 10.0–291.0)

## 2022-09-24 LAB — LIPID PANEL
Cholesterol: 174 mg/dL (ref 0–200)
HDL: 60.3 mg/dL (ref >39.00)
LDL Cholesterol: 99 mg/dL (ref 0–99)
NonHDL: 113.2
Total CHOL/HDL Ratio: 3
Triglycerides: 72 mg/dL (ref 0.0–149.0)
VLDL: 14.4 mg/dL (ref 0.0–40.0)

## 2022-09-24 LAB — VITAMIN B12: Vitamin B-12: 1354 pg/mL — ABNORMAL HIGH (ref 211–911)

## 2022-09-24 LAB — TSH: TSH: 2.07 u[IU]/mL (ref 0.35–5.50)

## 2022-09-24 LAB — VITAMIN D 25 HYDROXY (VIT D DEFICIENCY, FRACTURES): VITD: 21.55 ng/mL — ABNORMAL LOW (ref 30.00–100.00)

## 2022-09-24 LAB — HEMOGLOBIN A1C: Hgb A1c MFr Bld: 5.9 % (ref 4.6–6.5)

## 2022-09-24 MED ORDER — ALBUTEROL SULFATE HFA 108 (90 BASE) MCG/ACT IN AERS: INHALATION_SPRAY | RESPIRATORY_TRACT | 3 refills | Status: DC

## 2022-09-24 NOTE — Progress Notes (Unsigned)
Patient ID: Brandi Shaffer, female   DOB: 1945-05-27, 77 y.o.   MRN: [id number]         Chief Complaint:: wellness exam and Medical Management of Chronic Issues (3 month follow up , still having some swelling even with taking water pill wants to know if it could do with a med she is taking )  , nail pallor, low thyroid, dm, iron def anemia, htn, hld, low vit d       HPI:  Brandi Shaffer is a 77 y.o. female here for wellness exam; for shingrix at pharmacy, o/w up to date                        Also Pt denies chest pain, increased sob or doe, wheezing, orthopnea, PND, increased LE swelling, palpitations, dizziness or syncope.   Pt denies polydipsia, polyuria, or new focal neuro s/s.    Pt denies fever, wt loss, night sweats, loss of appetite, or other constitutional symptoms  Has mild nail pallor, wondering if means anemia.     Wt Readings from Last 3 Encounters:  09/24/22 189 lb (85.7 kg)  09/14/22 186 lb (84.4 kg)  09/03/22 192 lb (87.1 kg)   BP Readings from Last 3 Encounters:  09/24/22 122/72  09/03/22 122/82  08/14/22 128/71   Immunization History  Administered Date(s) Administered   Fluad Quad(high Dose 65+) 01/06/2019, 01/11/2021, 01/11/2022   Influenza Split 11/21/2010, 12/05/2011   Influenza Whole 12/06/2009   Influenza, High Dose Seasonal PF 12/04/2012, 12/04/2013, 11/14/2016, 11/15/2017   Influenza,inj,Quad PF,6+ Mos 12/15/2014   PFIZER(Purple Top)SARS-COV-2 Vaccination 05/23/2019, 05/27/2019, 06/13/2019   Pneumococcal Conjugate-13 12/19/2012   Pneumococcal Polysaccharide-23 11/27/2004, 12/06/2009, 06/25/2017   Td 11/27/2004, 06/08/2014   Health Maintenance Due  Topic Date Due   Zoster Vaccines- Shingrix (1 of 2) Never done      Past Medical History:  Diagnosis Date   ANEMIA-IRON DEFICIENCY 12/06/2009   Anxiety state 07/01/2015   Asthma 12/05/2011   DIABETES MELLITUS, TYPE II 09/17/2006   DIVERTICULOSIS, COLON 12/06/2009   GENITAL HERPES 03/24/2010   GERD  (gastroesophageal reflux disease) 06/28/2011   HYPERLIPIDEMIA 03/24/2010   HYPERTENSION 09/17/2006   HYPOTHYROIDISM 09/17/2006   LIPOMA 12/06/2009   PERIPHERAL EDEMA 03/24/2010   Renal cyst 07/05/2011   1.9 cm minimally complex   Past Surgical History:  Procedure Laterality Date   ABDOMINAL HYSTERECTOMY     fibroids   ankle surgury     x 3 left - chronic pain/swelling   COLONOSCOPY  2011   SHOULDER SURGERY Left    TONSILLECTOMY     TUBAL LIGATION      reports that she has never smoked. She has never used smokeless tobacco. She reports that she does not drink alcohol and does not use drugs. family history includes Breast cancer (age of onset: 20) in her sister; Diabetes in an other family member; Heart disease in her sister; Hypertension in her father and mother; Joint hypermobility in an other family member; Prostate cancer in her brother; Stroke in her sister. Allergies  Allergen Reactions   Januvia [Sitagliptin Phosphate]     dizziness   Lipitor [Atorvastatin] Other (See Comments)    Myalgia    Metformin And Related Nausea Only   Doxycycline Rash   Current Outpatient Medications on File Prior to Visit  Medication Sig Dispense Refill   amLODipine (NORVASC) 10 MG tablet TAKE 1 TABLET BY MOUTH EVERY DAY 90 tablet 3   ammonium lactate (  LAC-HYDRIN) 12 % lotion APPLY 1 APPLICATION TOPICALLY AS NEEDED FOR DRY SKIN. 400 mL 5   APPLE CIDER VINEGAR PO Take by mouth.     aspirin 81 MG EC tablet TAKE 1 TABLET BY MOUTH DAILY. SWALLOW WHOLE. 30 tablet 11   chlorhexidine (PERIDEX) 0.12 % solution Use as directed 15 mLs in the mouth or throat 2 (two) times daily. 473 mL 11   Cholecalciferol 50 MCG (2000 UT) TABS 1 tab by mouth once daily 30 tablet 99   clotrimazole (LOTRIMIN) 1 % cream Apply to affected feet and between toes twice daily for 6 weeks for athlete's feet 60 g 1   furosemide (LASIX) 20 MG tablet Take 1 tablet (20 mg total) by mouth daily as needed. 90 tablet 3   losartan (COZAAR) 25  MG tablet Take 1 tablet (25 mg total) by mouth daily. 90 tablet 3   Misc Natural Products (CRANBERRY/PROBIOTIC PO) Take by mouth.     Multiple Vitamins-Minerals (WOMENS MULTIVITAMIN PO) Take 1 tablet by mouth daily.     OneTouch Delica Lancets 33G MISC 1 each by Does not apply route 2 (two) times daily. Use to check blood sugars twice a day Dx E11.9 200 each 11   ONETOUCH VERIO test strip USE 2 (TWO) TIMES DAILY. DX E11.9 100 strip 4   pioglitazone (ACTOS) 15 MG tablet TAKE 1 TABLET BY MOUTH EVERY DAY 90 tablet 2   potassium chloride (KLOR-CON) 10 MEQ tablet TAKE 1 TABLET BY MOUTH EVERY DAY 90 tablet 3   vitamin B-12 (CYANOCOBALAMIN) 1000 MCG tablet 1 tab by mouth mon - wed - thur 90 tablet 3   amoxicillin-clavulanate (AUGMENTIN) 875-125 MG tablet Take 1 tablet by mouth 2 (two) times daily. (Patient not taking: Reported on 09/14/2022) 20 tablet 0   cyclobenzaprine (FLEXERIL) 5 MG tablet Take 1 tablet (5 mg total) by mouth 3 (three) times daily as needed for muscle spasms. (Patient not taking: Reported on 09/14/2022) 30 tablet 1   fluconazole (DIFLUCAN) 150 MG tablet Take 1 tablet (150 mg total) by mouth every three (3) days as needed. (Patient not taking: Reported on 09/14/2022) 2 tablet 1   meloxicam (MOBIC) 15 MG tablet Take 1 tablet (15 mg total) by mouth daily. (Patient not taking: Reported on 09/14/2022) 30 tablet 0   rosuvastatin (CRESTOR) 20 MG tablet Take 1 tablet (20 mg total) by mouth daily. (Patient not taking: Reported on 09/14/2022) 90 tablet 3   No current facility-administered medications on file prior to visit.        ROS:  All others reviewed and negative.  Objective        PE:  BP 122/72 (BP Location: Right Arm, Patient Position: Sitting, Cuff Size: Normal)   Pulse 70   Temp 98 F (36.7 C) (Oral)   Ht 4\' 11"  (1.499 m)   Wt 189 lb (85.7 kg)   SpO2 100%   BMI 38.17 kg/m                 Constitutional: Pt appears in NAD               HENT: Head: NCAT.                Right  Ear: External ear normal.                 Left Ear: External ear normal.                Eyes: . Pupils  are equal, round, and reactive to light. Conjunctivae and EOM are normal               Nose: without d/c or deformity               Neck: Neck supple. Gross normal ROM               Cardiovascular: Normal rate and regular rhythm.                 Pulmonary/Chest: Effort normal and breath sounds without rales or wheezing.                Abd:  Soft, NT, ND, + BS, no organomegaly               Neurological: Pt is alert. At baseline orientation, motor grossly intact               Skin: Skin is warm. No rashes, no other new lesions, LE edema - trace edema bilateral               Psychiatric: Pt behavior is normal without agitation   Micro: none  Cardiac tracings I have personally interpreted today:  none  Pertinent Radiological findings (summarize): none   Lab Results  Component Value Date   WBC 4.8 09/24/2022   HGB 13.0 09/24/2022   HCT 39.8 09/24/2022   PLT 237.0 09/24/2022   GLUCOSE 87 09/24/2022   CHOL 174 09/24/2022   TRIG 72.0 09/24/2022   HDL 60.30 09/24/2022   LDLDIRECT 134.6 05/23/2010   LDLCALC 99 09/24/2022   ALT 14 09/24/2022   AST 18 09/24/2022   NA 142 09/24/2022   K 3.6 09/24/2022   CL 107 09/24/2022   CREATININE 0.92 09/24/2022   BUN 15 09/24/2022   CO2 28 09/24/2022   TSH 2.07 09/24/2022   HGBA1C 5.9 09/24/2022   MICROALBUR <0.7 09/24/2022   Assessment/Plan:  TONIESHA POPAL is a 77 y.o. Black or African American [2] female with  has a past medical history of ANEMIA-IRON DEFICIENCY (12/06/2009), Anxiety state (07/01/2015), Asthma (12/05/2011), DIABETES MELLITUS, TYPE II (09/17/2006), DIVERTICULOSIS, COLON (12/06/2009), GENITAL HERPES (03/24/2010), GERD (gastroesophageal reflux disease) (06/28/2011), HYPERLIPIDEMIA (03/24/2010), HYPERTENSION (09/17/2006), HYPOTHYROIDISM (09/17/2006), LIPOMA (12/06/2009), PERIPHERAL EDEMA (03/24/2010), and Renal cyst (07/05/2011).  Vitamin D  deficiency Last vitamin D Lab Results  Component Value Date   VD25OH 33.64 01/11/2022   Low, to start oral replacement   B12 deficiency Lab Results  Component Value Date   VITAMINB12 1,421 (H) 01/11/2022   Stable, cont oral replacement - b12 1000 mcg qd   Encounter for well adult exam with abnormal findings Age and sex appropriate education and counseling updated with regular exercise and diet Referrals for preventative services - none needed Immunizations addressed - for shingrix at pharmacy Smoking counseling  - none needed Evidence for depression or other mood disorder - none significant Most recent labs reviewed. I have personally reviewed and have noted: 1) the patient's medical and social history 2) The patient's current medications and supplements 3) The patient's height, weight, and BMI have been recorded in the chart   Hypothyroidism Lab Results  Component Value Date   TSH 2.07 09/24/2022   Stable, pt not requiring thyroid replacement at this time   Diabetes Lab Results  Component Value Date   HGBA1C 5.9 09/24/2022   Stable, pt to continue current medical treatment actos 15 qd   ANEMIA-IRON DEFICIENCY No recent overt bleeding, for f/u iron labs  Essential hypertension BP Readings from Last 3 Encounters:  09/24/22 122/72  09/03/22 122/82  08/14/22 128/71   Stable, pt to continue medical treatment norvasc 10 every day, losartan 25 qd   Hyperlipidemia Lab Results  Component Value Date   LDLCALC 99 09/24/2022   Uncontrolled, pt to continue current statin crestor 20 every day as declines change for now   Followup: Return in about 6 months (around 03/27/2023).  Oliver Barre, MD 09/26/2022 8:17 PM [facility]

## 2022-09-24 NOTE — Therapy (Signed)
OUTPATIENT PHYSICAL THERAPY LOWER EXTREMITY TREATMENT  Patient Name: Brandi Shaffer MRN: [id number] DOB:15-Aug-1945, 77 y.o., female Today's Date: 09/24/2022  END OF SESSION:  PT End of Session - 09/24/22 1149     Visit Number 11    Date for PT Re-Evaluation 10/04/22    Authorization Type [employer] MEDICARE [employer] DUAL COMPLETE    PT Start Time 1149    PT Stop Time 1230    PT Time Calculation (min) 41 min    Activity Tolerance Patient tolerated treatment well    Behavior During Therapy [facility] for tasks assessed/performed                 Past Medical History:  Diagnosis Date   ANEMIA-IRON DEFICIENCY 12/06/2009   Anxiety state 07/01/2015   Asthma 12/05/2011   DIABETES MELLITUS, TYPE II 09/17/2006   DIVERTICULOSIS, COLON 12/06/2009   GENITAL HERPES 03/24/2010   GERD (gastroesophageal reflux disease) 06/28/2011   HYPERLIPIDEMIA 03/24/2010   HYPERTENSION 09/17/2006   HYPOTHYROIDISM 09/17/2006   LIPOMA 12/06/2009   PERIPHERAL EDEMA 03/24/2010   Renal cyst 07/05/2011   1.9 cm minimally complex   Past Surgical History:  Procedure Laterality Date   ABDOMINAL HYSTERECTOMY     fibroids   ankle surgury     x 3 left - chronic pain/swelling   COLONOSCOPY  2011   SHOULDER SURGERY Left    TONSILLECTOMY     TUBAL LIGATION     Patient Active Problem List   Diagnosis Date Noted   Multiple skin nodules 06/28/2022   Dental infection 04/05/2022   Peripheral edema 03/26/2022   Leg length discrepancy 09/28/2020   Greater trochanteric bursitis, right 08/09/2020   Posterior vitreous detachment of right eye 02/18/2020   Posterior vitreous detachment of left eye 02/18/2020   Diabetes mellitus without complication (HCC) 02/18/2020   Nuclear sclerotic cataract of both eyes 02/18/2020   Urinary frequency 01/11/2020   Pelvic pain 01/11/2020   Vitamin D deficiency 01/11/2020   B12 deficiency 01/11/2020   Superficial phlebitis 09/03/2019   Pain of right thumb 01/06/2019    Sprain of second toe, left, initial encounter 12/18/2017   Acute upper respiratory infection 01/04/2017   Greater trochanteric bursitis of left hip 12/19/2015   Right otitis media 12/16/2015   Shoulder dislocation 08/03/2015   Anxiety state 07/01/2015   Cellulitis 04/01/2015   Osteoarthritis of left ankle 04/01/2015   Bilateral leg pain 02/15/2015   PVD (peripheral vascular disease) (HCC) 12/22/2014   Cellulitis of right lower leg 12/13/2014   Right leg pain 11/10/2014   Hx of colonic polyps 09/08/2013   Obesity (BMI 30-39.9) 09/08/2013   Impingement syndrome of left shoulder 06/04/2012   Mild aortic stenosis 12/05/2011   Asthma 12/05/2011   Renal cyst 07/05/2011   Low back pain 06/28/2011   GERD (gastroesophageal reflux disease) 06/28/2011   Carpal tunnel syndrome of right wrist 04/22/2011   Right foot pain 05/23/2010   Encounter for well adult exam with abnormal findings 05/23/2010   GENITAL HERPES 03/24/2010   Hyperlipidemia 03/24/2010   Nocturia 03/24/2010   LIPOMA 12/06/2009   ANEMIA-IRON DEFICIENCY 12/06/2009   CONJUNCTIVITIS, BILATERAL 12/06/2009   DIVERTICULOSIS, COLON 12/06/2009   HYPOTHYROIDISM 09/17/2006   Diabetes (HCC) 09/17/2006   Essential hypertension 09/17/2006    PCP: Corwin Levins, MD   REFERRING PROVIDER: Richardean Sale, DO  REFERRING DIAG: [phone] (ICD-10-CM) - Chronic pain of left ankle M19.172 (ICD-10-CM) - Post-traumatic osteoarthritis of left ankle  THERAPY DIAG:  Pain in left ankle  and joints of left foot  Muscle weakness (generalized)  Cramp and spasm  Difficulty in walking, not elsewhere classified  Stiffness of left ankle, not elsewhere classified  Rationale for Evaluation and Treatment: Rehabilitation  ONSET DATE: 08/02/2022  SUBJECTIVE:   SUBJECTIVE STATEMENT: Patient states it's aching today due to the weather. Pt states she got up early and saw her PCP.  PERTINENT HISTORY: ORIF left ankle 5 years ago PAIN:   Are you having pain? Yes: NPRS scale: 6-7/10 Pain location: left ankle, medial side and R forefoot Pain description: aching, "feels like when you twist your wrist"   Aggravating factors: "when I move it the wrong way", unclear  Relieving factors: "green oil", icy hot, "rubbing it down"   PRECAUTIONS: None  WEIGHT BEARING RESTRICTIONS: No  FALLS:  Has patient fallen in last 6 months? No  LIVING ENVIRONMENT: Lives with: lives alone Lives in: House/apartment Stairs: Yes: External: 3 steps; on right going up Has following equipment at home: Single point cane  OCCUPATION: retired  PLOF: Independent, Independent with household mobility without device, Independent with community mobility without device, Independent with homemaking with ambulation, Independent with gait, and Independent with transfers  PATIENT GOALS: To keep my left ankle from flaring up and giving me trouble  NEXT MD VISIT: prn  OBJECTIVE:   DIAGNOSTIC FINDINGS: na  PATIENT SURVEYS:  Initital eval: LEFS: 12/80 09/21/22:  LEFS: 63/80  COGNITION: Overall cognitive status: Within functional limits for tasks assessed     SENSATION: WFL  POSTURE:  slightly fwd bent with min knee extension and short step length  PALPATION: Tender at peroneal tendon area  LOWER EXTREMITY ROM:  Active ROM Right eval Left eval Left 09/21/22  Ankle dorsiflexion  Neutral (0) 10  Ankle plantarflexion  20 32  Ankle inversion  20 28  Ankle eversion  5 18   (Blank rows = not tested)  LOWER EXTREMITY MMT:  MMT Right eval Left eval Left 09/21/22  Ankle dorsiflexion  5 5  Ankle plantarflexion  5- 5  Ankle inversion  4 4+  Ankle eversion  3+ 4+   (Blank rows = not tested)  LOWER EXTREMITY SPECIAL TESTS:  NA  FUNCTIONAL TESTS:  Initial eval: 5 times sit to stand: 14.86 sec Timed up and go (TUG): 10.93 sec  09/21/22: 5 times sit to stand: 10.58 sec Timed up and go (TUG): 8.84  sec  GAIT: Distance walked:  30 Assistive device utilized: Single point cane and None Level of assistance: Modified independence Comments: Typically does not use any a.d but just bought a cane.  I have it in case I need it.     TODAY'S TREATMENT:                                                                                                                              DATE:  09/24/22 TherEx Recumbent bike L1 x 5 min Gastroc stretch on step 2x30 sec B Soleus  stretch on step 2x30 sec Ankle DF on step with forward knee flexion x10 L Eccentric heel raise x10 Right foot on step heel raise on L 2x10 TherAct Runner's step up 6" step 2x10 Resisted backwards walking 15# x10 Resisted side walking 15# x10 NMR Alternating step tap forward on 6" step 2x10 standing on foam Step tap sideways 6" step 2x10 standing on foam Staggered stance on foam 2x30 sec L LE back Feet together eyes closed 2x30 sec  DATE:  09/21/22 Nustep x 5 min level 5 Eccentric heel raises x 10 10th visit re-assessment Review of HEP  09/19/22 TherEx Nustep L5 x 5 minutes BLEs  Rocker board x 2 min Gastroc (bilateral)  and soleus (left) stretches on rocker board propped 5 times, 10 seconds Single leg inversion/eversion x 20 on half roll TherAct Step ups on 6" step  2 x 10 leading with left LE Retro step 2 x 10 on 4" step Sit to stand x 10 with normal stance, then x 10 with staggered stand (left foot in back) NMR On purple foam pad: Step up and hold x 10 fwd then x 10 lateral (left) On purple foam pad: Marching x 20 Alternating cone taps x 20 SLS floor with cone touches (cone at waist level) x 10  09/14/22 TherEx Nustep L5 x 5 minutes BLEs  Rocker board x 2 min Gastroc (bilateral)  and soleus (left) stretches on rocker board propped 5 times, 10 seconds   Single leg inversion/eversion x 20 on half roll TherAct Step ups on 4" step (due to knee issues) 2 x 10 leading with left LE Retro step 2 x 10 on 4" step Sit to stand x 10 with normal  stance, then x 10 with staggered stand (left foot in back) NMR On purple foam pad: Step up and hold x 10 fwd then x 10 lateral (left) On purple foam pad: Marching x 20 Alternating cone taps x 20 Lunge to BOSU both x 10 each fwd then lateral SLS floor with cone touches (cone at waist level) x 10  09/11/22 TherEx Nustep L5 x 5 minutes BLEs  Rocker board x 2 min Plantar fascia stretches on prostretch 5 times, 10 seconds left  Single leg inversion/eversion x 20 on half roll TherAct Step ups on 4" step (due to knee issues) 2 x 10 leading with left LE Retro step 2 x 10 on 4" step Sit to stand x 10 with normal stance, then x 10 with staggered stand (left foot in back) NMR On purple foam pad: Step up and hold x 10 fwd then x 10 lateral (left) On purple foam pad: Marching x 20 Alternating cone taps x 20 SLS floor with cone touches (cone at waist level) x 10  08/09/22 Initiated HEP, Initial eval completed   PATIENT EDUCATION:  Education details: Initiated HEP Person educated: Patient Education method: Programmer, multimedia, Facilities manager, Verbal cues, and Handouts Education comprehension: verbalized understanding and returned demonstration  HOME EXERCISE PROGRAM: Access Code: [email or web address] URL: [email or web address] Date: 08/09/2022 Prepared by: Mikey Kirschner  Exercises - Seated Ankle Alphabet  - 1 x daily - 7 x weekly - 3 sets - 10 reps - Long Sitting Calf Stretch with Strap  - 1 x daily - 7 x weekly - 3 sets - 10 reps - Seated Ankle Inversion Eversion PROM  - 1 x daily - 7 x weekly - 3 sets - 10 reps - Seated Heel Raise  - 1 x daily - 7 x weekly - 3 sets -  10 reps - Seated Toe Raise  - 1 x daily - 7 x weekly - 3 sets - 10 reps  ASSESSMENT:  CLINICAL IMPRESSION: Korianna comes in with increase in her normal pain due to the weather.   OBJECTIVE IMPAIRMENTS: Abnormal gait, decreased balance, decreased mobility, difficulty walking, decreased ROM, decreased strength, hypomobility,  increased edema, increased fascial restrictions, increased muscle spasms, impaired flexibility, and pain.    GOALS: Goals reviewed with patient? Yes  SHORT TERM GOALS: Target date: 09/06/2022  Pain report to be no greater than 4/10  Baseline: Goal status: MET 09/05/22  2.  Patient will be independent with initial HEP  Baseline:  Goal status: MET 09/05/22   LONG TERM GOALS: Target date: 10/04/2022   Patient to report pain no greater than 2/10  Baseline:  Goal status: MET 09/14/22  2.  Patient to be independent with advanced HEP  Baseline:  Goal status: MET 09/21/22  3.  Patient to improve by 2-3 seconds on 5 times sit to stand and TUG Baseline:  Goal status: MET 09/21/22  4.  Ankle DF to improve to 10-15 degrees Baseline:  Goal status: MET  5.  Patient to be able to ascend and descend steps with reciprocal gait Baseline:  Goal status: MET 09/19/22  6.  LEFS score to improve by 2 points Baseline:  Goal status: MET 09/21/22  7.  Patient to be able to do reciprocal gait on steps with confidence and safely.   Baseline:  She currently hesitates to visit her grandkids due to the steps. Goal status: NEW 09/21/22  8. Patient to be able to bend stoop and squat with increased ease to be able to pick up things from the floor. Baseline:  Difficulty with picking up objects from the floor while cleaning house Goal status: NEW 09/21/22   PLAN:  PT FREQUENCY: 1-2x/week  PT DURATION: 8 weeks  PLANNED INTERVENTIONS: Therapeutic exercises, Therapeutic activity, Neuromuscular re-education, Balance training, Gait training, Patient/Family education, Self Care, Joint mobilization, Stair training, Orthotic/Fit training, DME instructions, Aquatic Therapy, Dry Needling, Electrical stimulation, Cryotherapy, Moist heat, Compression bandaging, scar mobilization, Splintting, Taping, Vasopneumatic device, Ultrasound, Ionotophoresis 4mg /ml Dexamethasone, Manual therapy, and Re-evaluation  PLAN FOR  NEXT SESSION: Work on functional stepping and stairs along with functional squatting.   [facility] April Randalyn Rhea [place], PT 09/24/22 11:51 AM [facility] [address], [address] [place], [place] [address] Phone # [phone] Fax [phone]

## 2022-09-24 NOTE — Patient Instructions (Addendum)
Please have your Shingrix (shingles) shots done at your local pharmacy.  Please continue all other medications as before, and refills have been done if requested.  Please have the pharmacy call with any other refills you may need.  Please continue your efforts at being more active, low cholesterol diet, and weight control.  You are otherwise up to date with prevention measures today.  Please keep your appointments with your specialists as you may have planned  Please go to the LAB at the blood drawing area for the tests to be done  You will be contacted by phone if any changes need to be made immediately.  Otherwise, you will receive a letter about your results with an explanation, but please check with MyChart first.  Please remember to sign up for MyChart if you have not done so, as this will be important to you in the future with finding out test results, communicating by private email, and scheduling acute appointments online when needed.  Please make an Appointment to return in 6 months, or sooner if needed

## 2022-09-24 NOTE — Progress Notes (Signed)
The test results show that your current treatment is OK, as the tests are stable.  Please continue the same plan.  There is no other need for change of treatment or further evaluation based on these results, at this time.  thanks 

## 2022-09-24 NOTE — Assessment & Plan Note (Signed)
Lab Results  Component Value Date   VITAMINB12 4,503 (H) 01/11/2022   Stable, cont oral replacement - b12 1000 mcg qd

## 2022-09-24 NOTE — Assessment & Plan Note (Signed)
Last vitamin D Lab Results  Component Value Date   VD25OH 33.64 01/11/2022   Low, to start  oral replacement

## 2022-09-26 ENCOUNTER — Encounter: Payer: Self-pay | Admitting: Internal Medicine

## 2022-09-26 NOTE — Assessment & Plan Note (Signed)
Lab Results  Component Value Date   TSH 2.07 09/24/2022   Stable, pt not requiring thyroid replacement at this time

## 2022-09-26 NOTE — Assessment & Plan Note (Signed)
Lab Results  Component Value Date   LDLCALC 99 09/24/2022   Uncontrolled, pt to continue current statin crestor 20 every day as declines change for now

## 2022-09-26 NOTE — Assessment & Plan Note (Signed)

## 2022-09-26 NOTE — Assessment & Plan Note (Signed)
Lab Results  Component Value Date   HGBA1C 5.9 09/24/2022   Stable, pt to continue current medical treatment actos 15 qd

## 2022-09-26 NOTE — Assessment & Plan Note (Signed)
No recent overt bleeding, for f/u iron labs

## 2022-09-26 NOTE — Assessment & Plan Note (Signed)
BP Readings from Last 3 Encounters:  09/24/22 122/72  09/03/22 122/82  08/14/22 128/71   Stable, pt to continue medical treatment norvasc 10 every day, losartan 25 qd

## 2022-09-28 ENCOUNTER — Other Ambulatory Visit: Payer: Self-pay | Admitting: Internal Medicine

## 2022-09-28 ENCOUNTER — Ambulatory Visit: Payer: 59 | Attending: Sports Medicine | Admitting: Physical Therapy

## 2022-09-28 DIAGNOSIS — M25572 Pain in left ankle and joints of left foot: Secondary | ICD-10-CM | POA: Diagnosis not present

## 2022-09-28 DIAGNOSIS — R262 Difficulty in walking, not elsewhere classified: Secondary | ICD-10-CM | POA: Diagnosis not present

## 2022-09-28 DIAGNOSIS — R252 Cramp and spasm: Secondary | ICD-10-CM | POA: Diagnosis not present

## 2022-09-28 DIAGNOSIS — M6281 Muscle weakness (generalized): Secondary | ICD-10-CM | POA: Insufficient documentation

## 2022-09-28 DIAGNOSIS — M25672 Stiffness of left ankle, not elsewhere classified: Secondary | ICD-10-CM | POA: Diagnosis not present

## 2022-09-28 NOTE — Therapy (Signed)
OUTPATIENT PHYSICAL THERAPY LOWER EXTREMITY TREATMENT  Patient Name: Brandi Shaffer MRN: [id number] DOB:06-09-1945, 77 y.o., female Today's Date: 09/28/2022  END OF SESSION:  PT End of Session - 09/28/22 0840     Visit Number 12    Date for PT Re-Evaluation 10/04/22    Authorization Type [employer] MEDICARE [employer] DUAL COMPLETE    PT Start Time 0845    PT Stop Time 0925    PT Time Calculation (min) 40 min    Activity Tolerance Patient tolerated treatment well    Behavior During Therapy [facility] for tasks assessed/performed              Past Medical History:  Diagnosis Date   ANEMIA-IRON DEFICIENCY 12/06/2009   Anxiety state 07/01/2015   Asthma 12/05/2011   DIABETES MELLITUS, TYPE II 09/17/2006   DIVERTICULOSIS, COLON 12/06/2009   GENITAL HERPES 03/24/2010   GERD (gastroesophageal reflux disease) 06/28/2011   HYPERLIPIDEMIA 03/24/2010   HYPERTENSION 09/17/2006   HYPOTHYROIDISM 09/17/2006   LIPOMA 12/06/2009   PERIPHERAL EDEMA 03/24/2010   Renal cyst 07/05/2011   1.9 cm minimally complex   Past Surgical History:  Procedure Laterality Date   ABDOMINAL HYSTERECTOMY     fibroids   ankle surgury     x 3 left - chronic pain/swelling   COLONOSCOPY  2011   SHOULDER SURGERY Left    TONSILLECTOMY     TUBAL LIGATION     Patient Active Problem List   Diagnosis Date Noted   Multiple skin nodules 06/28/2022   Dental infection 04/05/2022   Peripheral edema 03/26/2022   Leg length discrepancy 09/28/2020   Greater trochanteric bursitis, right 08/09/2020   Posterior vitreous detachment of right eye 02/18/2020   Posterior vitreous detachment of left eye 02/18/2020   Diabetes mellitus without complication (HCC) 02/18/2020   Nuclear sclerotic cataract of both eyes 02/18/2020   Urinary frequency 01/11/2020   Pelvic pain 01/11/2020   Vitamin D deficiency 01/11/2020   B12 deficiency 01/11/2020   Superficial phlebitis 09/03/2019   Pain of right thumb 01/06/2019   Sprain of  second toe, left, initial encounter 12/18/2017   Acute upper respiratory infection 01/04/2017   Greater trochanteric bursitis of left hip 12/19/2015   Right otitis media 12/16/2015   Shoulder dislocation 08/03/2015   Anxiety state 07/01/2015   Cellulitis 04/01/2015   Osteoarthritis of left ankle 04/01/2015   Bilateral leg pain 02/15/2015   PVD (peripheral vascular disease) (HCC) 12/22/2014   Cellulitis of right lower leg 12/13/2014   Right leg pain 11/10/2014   Hx of colonic polyps 09/08/2013   Obesity (BMI 30-39.9) 09/08/2013   Impingement syndrome of left shoulder 06/04/2012   Mild aortic stenosis 12/05/2011   Asthma 12/05/2011   Renal cyst 07/05/2011   Low back pain 06/28/2011   GERD (gastroesophageal reflux disease) 06/28/2011   Carpal tunnel syndrome of right wrist 04/22/2011   Right foot pain 05/23/2010   Encounter for well adult exam with abnormal findings 05/23/2010   GENITAL HERPES 03/24/2010   Hyperlipidemia 03/24/2010   Nocturia 03/24/2010   LIPOMA 12/06/2009   ANEMIA-IRON DEFICIENCY 12/06/2009   CONJUNCTIVITIS, BILATERAL 12/06/2009   DIVERTICULOSIS, COLON 12/06/2009   Hypothyroidism 09/17/2006   Diabetes (HCC) 09/17/2006   Essential hypertension 09/17/2006    PCP: Corwin Levins, MD   REFERRING PROVIDER: Richardean Sale, DO  REFERRING DIAG: [phone] (ICD-10-CM) - Chronic pain of left ankle M19.172 (ICD-10-CM) - Post-traumatic osteoarthritis of left ankle  THERAPY DIAG:  Pain in left ankle and joints of  left foot  Muscle weakness (generalized)  Cramp and spasm  Difficulty in walking, not elsewhere classified  Stiffness of left ankle, not elsewhere classified  Rationale for Evaluation and Treatment: Rehabilitation  ONSET DATE: 08/02/2022  SUBJECTIVE:   SUBJECTIVE STATEMENT: Pt reports a little pain when she first got up but nothing bad.   PERTINENT HISTORY: ORIF left ankle 5 years ago PAIN:  Are you having pain? Yes: NPRS scale: 0  currently/10 Pain location: left ankle, medial side and R forefoot Pain description: aching, "feels like when you twist your wrist"   Aggravating factors: "when I move it the wrong way", unclear  Relieving factors: "green oil", icy hot, "rubbing it down"   PRECAUTIONS: None  WEIGHT BEARING RESTRICTIONS: No  FALLS:  Has patient fallen in last 6 months? No  LIVING ENVIRONMENT: Lives with: lives alone Lives in: House/apartment Stairs: Yes: External: 3 steps; on right going up Has following equipment at home: Single point cane  OCCUPATION: retired  PLOF: Independent, Independent with household mobility without device, Independent with community mobility without device, Independent with homemaking with ambulation, Independent with gait, and Independent with transfers  PATIENT GOALS: To keep my left ankle from flaring up and giving me trouble  NEXT MD VISIT: prn  OBJECTIVE:   DIAGNOSTIC FINDINGS: na  PATIENT SURVEYS:  Initital eval: LEFS: 12/80 09/21/22:  LEFS: 63/80  COGNITION: Overall cognitive status: Within functional limits for tasks assessed     SENSATION: WFL  POSTURE:  slightly fwd bent with min knee extension and short step length  PALPATION: Tender at peroneal tendon area  LOWER EXTREMITY ROM:  Active ROM Right eval Left eval Left 09/21/22  Ankle dorsiflexion  Neutral (0) 10  Ankle plantarflexion  20 32  Ankle inversion  20 28  Ankle eversion  5 18   (Blank rows = not tested)  LOWER EXTREMITY MMT:  MMT Right eval Left eval Left 09/21/22  Ankle dorsiflexion  5 5  Ankle plantarflexion  5- 5  Ankle inversion  4 4+  Ankle eversion  3+ 4+   (Blank rows = not tested)  LOWER EXTREMITY SPECIAL TESTS:  NA  FUNCTIONAL TESTS:  Initial eval: 5 times sit to stand: 14.86 sec Timed up and go (TUG): 10.93 sec  09/21/22: 5 times sit to stand: 10.58 sec Timed up and go (TUG): 8.84  sec  GAIT: Distance walked: 30 Assistive device utilized: Single point  cane and None Level of assistance: Modified independence Comments: Typically does not use any a.d but just bought a cane.  I have it in case I need it.     TODAY'S TREATMENT:                                                                                                                              DATE:  09/28/22 TherEx Recumbent bike L1 x5 min Sit to stand 10# 2x10 Deadlift 10# 2x10 Single leg heel raise 2x10 R&L TherAct Runner's  step up 6" step 5# x10 Step down off 4" step 2x10 Golfer's lift 2x10 Farmer's carry 2 x6# weights x 2 laps around gym Luggage carry 6# x 1 lap each around gym  DATE:  09/24/22 TherEx Recumbent bike L1 x 5 min Gastroc stretch on step 2x30 sec B Soleus stretch on step 2x30 sec Ankle DF on step with forward knee flexion x10 L Eccentric heel raise x10 Right foot on step heel raise on L 2x10 TherAct Runner's step up 6" step 2x10 Resisted backwards walking 15# x10 Resisted side walking 15# x10 NMR Alternating step tap forward on 6" step 2x10 standing on foam Step tap sideways 6" step 2x10 standing on foam Staggered stance on foam 2x30 sec L LE back Feet together eyes closed 2x30 sec  DATE:  09/21/22 Nustep x 5 min level 5 Eccentric heel raises x 10 10th visit re-assessment Review of HEP  09/19/22 TherEx Nustep L5 x 5 minutes BLEs  Rocker board x 2 min Gastroc (bilateral)  and soleus (left) stretches on rocker board propped 5 times, 10 seconds Single leg inversion/eversion x 20 on half roll TherAct Step ups on 6" step  2 x 10 leading with left LE Retro step 2 x 10 on 4" step Sit to stand x 10 with normal stance, then x 10 with staggered stand (left foot in back) NMR On purple foam pad: Step up and hold x 10 fwd then x 10 lateral (left) On purple foam pad: Marching x 20 Alternating cone taps x 20 SLS floor with cone touches (cone at waist level) x 10  09/14/22 TherEx Nustep L5 x 5 minutes BLEs  Rocker board x 2 min Gastroc (bilateral)   and soleus (left) stretches on rocker board propped 5 times, 10 seconds   Single leg inversion/eversion x 20 on half roll TherAct Step ups on 4" step (due to knee issues) 2 x 10 leading with left LE Retro step 2 x 10 on 4" step Sit to stand x 10 with normal stance, then x 10 with staggered stand (left foot in back) NMR On purple foam pad: Step up and hold x 10 fwd then x 10 lateral (left) On purple foam pad: Marching x 20 Alternating cone taps x 20 Lunge to BOSU both x 10 each fwd then lateral SLS floor with cone touches (cone at waist level) x 10  09/11/22 TherEx Nustep L5 x 5 minutes BLEs  Rocker board x 2 min Plantar fascia stretches on prostretch 5 times, 10 seconds left  Single leg inversion/eversion x 20 on half roll TherAct Step ups on 4" step (due to knee issues) 2 x 10 leading with left LE Retro step 2 x 10 on 4" step Sit to stand x 10 with normal stance, then x 10 with staggered stand (left foot in back) NMR On purple foam pad: Step up and hold x 10 fwd then x 10 lateral (left) On purple foam pad: Marching x 20 Alternating cone taps x 20 SLS floor with cone touches (cone at waist level) x 10  08/09/22 Initiated HEP, Initial eval completed   PATIENT EDUCATION:  Education details: Initiated HEP Person educated: Patient Education method: Programmer, multimedia, Facilities manager, Verbal cues, and Handouts Education comprehension: verbalized understanding and returned demonstration  HOME EXERCISE PROGRAM: Access Code: [email or web address] URL: [email or web address] Date: 08/09/2022 Prepared by: Mikey Kirschner  Exercises - Seated Ankle Alphabet  - 1 x daily - 7 x weekly - 3 sets - 10 reps - Long Sitting Calf Stretch with  Strap  - 1 x daily - 7 x weekly - 3 sets - 10 reps - Seated Ankle Inversion Eversion PROM  - 1 x daily - 7 x weekly - 3 sets - 10 reps - Seated Heel Raise  - 1 x daily - 7 x weekly - 3 sets - 10 reps - Seated Toe Raise  - 1 x daily - 7 x weekly - 3 sets - 10  reps  ASSESSMENT:  CLINICAL IMPRESSION: Focused more on lifting and stabilizing with increased weight. Able to tolerate 10# well. Improving dynamic stability noted.   OBJECTIVE IMPAIRMENTS: Abnormal gait, decreased balance, decreased mobility, difficulty walking, decreased ROM, decreased strength, hypomobility, increased edema, increased fascial restrictions, increased muscle spasms, impaired flexibility, and pain.    GOALS: Goals reviewed with patient? Yes  SHORT TERM GOALS: Target date: 09/06/2022  Pain report to be no greater than 4/10  Baseline: Goal status: MET 09/05/22  2.  Patient will be independent with initial HEP  Baseline:  Goal status: MET 09/05/22   LONG TERM GOALS: Target date: 10/04/2022   Patient to report pain no greater than 2/10  Baseline:  Goal status: MET 09/14/22  2.  Patient to be independent with advanced HEP  Baseline:  Goal status: MET 09/21/22  3.  Patient to improve by 2-3 seconds on 5 times sit to stand and TUG Baseline:  Goal status: MET 09/21/22  4.  Ankle DF to improve to 10-15 degrees Baseline:  Goal status: MET  5.  Patient to be able to ascend and descend steps with reciprocal gait Baseline:  Goal status: MET 09/19/22  6.  LEFS score to improve by 2 points Baseline:  Goal status: MET 09/21/22  7.  Patient to be able to do reciprocal gait on steps with confidence and safely.   Baseline:  She currently hesitates to visit her grandkids due to the steps. Goal status: NEW 09/21/22  8. Patient to be able to bend stoop and squat with increased ease to be able to pick up things from the floor. Baseline:  Difficulty with picking up objects from the floor while cleaning house Goal status: NEW 09/21/22   PLAN:  PT FREQUENCY: 1-2x/week  PT DURATION: 8 weeks  PLANNED INTERVENTIONS: Therapeutic exercises, Therapeutic activity, Neuromuscular re-education, Balance training, Gait training, Patient/Family education, Self Care, Joint  mobilization, Stair training, Orthotic/Fit training, DME instructions, Aquatic Therapy, Dry Needling, Electrical stimulation, Cryotherapy, Moist heat, Compression bandaging, scar mobilization, Splintting, Taping, Vasopneumatic device, Ultrasound, Ionotophoresis 4mg /ml Dexamethasone, Manual therapy, and Re-evaluation  PLAN FOR NEXT SESSION: Work on functional stepping and stairs along with functional squatting.   [facility] April Randalyn Rhea Rusk, Organ 09/28/22 8:41 AM [facility] [address], [address] [place], [place] [address] Phone # [phone] Fax [phone]

## 2022-10-03 ENCOUNTER — Ambulatory Visit: Payer: 59

## 2022-10-03 DIAGNOSIS — M25672 Stiffness of left ankle, not elsewhere classified: Secondary | ICD-10-CM

## 2022-10-03 DIAGNOSIS — M6281 Muscle weakness (generalized): Secondary | ICD-10-CM | POA: Diagnosis not present

## 2022-10-03 DIAGNOSIS — M25572 Pain in left ankle and joints of left foot: Secondary | ICD-10-CM | POA: Diagnosis not present

## 2022-10-03 DIAGNOSIS — R252 Cramp and spasm: Secondary | ICD-10-CM

## 2022-10-03 DIAGNOSIS — R262 Difficulty in walking, not elsewhere classified: Secondary | ICD-10-CM | POA: Diagnosis not present

## 2022-10-03 NOTE — Therapy (Signed)
OUTPATIENT PHYSICAL THERAPY LOWER EXTREMITY TREATMENT PHYSICAL THERAPY DISCHARGE SUMMARY  Visits from Start of Care: 13  Current functional level related to goals / functional outcomes: See below   Remaining deficits: See below   Education / Equipment: See below   Patient agrees to discharge. Patient goals were met. Patient is being discharged due to meeting the stated rehab goals.   Patient Name: Brandi Shaffer MRN: [id number] DOB:29-Sep-1945, 77 y.o., female Today's Date: 10/03/2022  END OF SESSION:  PT End of Session - 10/03/22 0847     Visit Number 13    Date for PT Re-Evaluation 10/04/22    Authorization Type [employer] MEDICARE [employer] DUAL COMPLETE    PT Start Time [phone]    PT Stop Time 0930    PT Time Calculation (min) 43 min    Activity Tolerance Patient tolerated treatment well    Behavior During Therapy [facility] for tasks assessed/performed              Past Medical History:  Diagnosis Date   ANEMIA-IRON DEFICIENCY 12/06/2009   Anxiety state 07/01/2015   Asthma 12/05/2011   DIABETES MELLITUS, TYPE II 09/17/2006   DIVERTICULOSIS, COLON 12/06/2009   GENITAL HERPES 03/24/2010   GERD (gastroesophageal reflux disease) 06/28/2011   HYPERLIPIDEMIA 03/24/2010   HYPERTENSION 09/17/2006   HYPOTHYROIDISM 09/17/2006   LIPOMA 12/06/2009   PERIPHERAL EDEMA 03/24/2010   Renal cyst 07/05/2011   1.9 cm minimally complex   Past Surgical History:  Procedure Laterality Date   ABDOMINAL HYSTERECTOMY     fibroids   ankle surgury     x 3 left - chronic pain/swelling   COLONOSCOPY  2011   SHOULDER SURGERY Left    TONSILLECTOMY     TUBAL LIGATION     Patient Active Problem List   Diagnosis Date Noted   Multiple skin nodules 06/28/2022   Dental infection 04/05/2022   Peripheral edema 03/26/2022   Leg length discrepancy 09/28/2020   Greater trochanteric bursitis, right 08/09/2020   Posterior vitreous detachment of right eye 02/18/2020   Posterior vitreous  detachment of left eye 02/18/2020   Diabetes mellitus without complication (HCC) 02/18/2020   Nuclear sclerotic cataract of both eyes 02/18/2020   Urinary frequency 01/11/2020   Pelvic pain 01/11/2020   Vitamin D deficiency 01/11/2020   B12 deficiency 01/11/2020   Superficial phlebitis 09/03/2019   Pain of right thumb 01/06/2019   Sprain of second toe, left, initial encounter 12/18/2017   Acute upper respiratory infection 01/04/2017   Greater trochanteric bursitis of left hip 12/19/2015   Right otitis media 12/16/2015   Shoulder dislocation 08/03/2015   Anxiety state 07/01/2015   Cellulitis 04/01/2015   Osteoarthritis of left ankle 04/01/2015   Bilateral leg pain 02/15/2015   PVD (peripheral vascular disease) (HCC) 12/22/2014   Cellulitis of right lower leg 12/13/2014   Right leg pain 11/10/2014   Hx of colonic polyps 09/08/2013   Obesity (BMI 30-39.9) 09/08/2013   Impingement syndrome of left shoulder 06/04/2012   Mild aortic stenosis 12/05/2011   Asthma 12/05/2011   Renal cyst 07/05/2011   Low back pain 06/28/2011   GERD (gastroesophageal reflux disease) 06/28/2011   Carpal tunnel syndrome of right wrist 04/22/2011   Right foot pain 05/23/2010   Encounter for well adult exam with abnormal findings 05/23/2010   GENITAL HERPES 03/24/2010   Hyperlipidemia 03/24/2010   Nocturia 03/24/2010   LIPOMA 12/06/2009   ANEMIA-IRON DEFICIENCY 12/06/2009   CONJUNCTIVITIS, BILATERAL 12/06/2009   DIVERTICULOSIS, COLON 12/06/2009  Hypothyroidism 09/17/2006   Diabetes (HCC) 09/17/2006   Essential hypertension 09/17/2006    PCP: Corwin Levins, MD   REFERRING PROVIDER: Richardean Sale, DO  REFERRING DIAG: [phone] (ICD-10-CM) - Chronic pain of left ankle M19.172 (ICD-10-CM) - Post-traumatic osteoarthritis of left ankle  THERAPY DIAG:  Pain in left ankle and joints of left foot  Muscle weakness (generalized)  Cramp and spasm  Difficulty in walking, not elsewhere  classified  Stiffness of left ankle, not elsewhere classified  Rationale for Evaluation and Treatment: Rehabilitation  ONSET DATE: 08/02/2022  SUBJECTIVE:   SUBJECTIVE STATEMENT: Pt reports she is feeling great about her progress.  I went to a wedding shower this past weekend and had to walk up an incline and up and down steps and I was able to do this with confidence and no one had to help me.    PERTINENT HISTORY: ORIF left ankle 5 years ago PAIN:  Are you having pain? Yes: NPRS scale: 0 currently/10 Pain location: left ankle, medial side and R forefoot Pain description: aching, "feels like when you twist your wrist"   Aggravating factors: "when I move it the wrong way", unclear  Relieving factors: "green oil", icy hot, "rubbing it down"   PRECAUTIONS: None  WEIGHT BEARING RESTRICTIONS: No  FALLS:  Has patient fallen in last 6 months? No  LIVING ENVIRONMENT: Lives with: lives alone Lives in: House/apartment Stairs: Yes: External: 3 steps; on right going up Has following equipment at home: Single point cane  OCCUPATION: retired  PLOF: Independent, Independent with household mobility without device, Independent with community mobility without device, Independent with homemaking with ambulation, Independent with gait, and Independent with transfers  PATIENT GOALS: To keep my left ankle from flaring up and giving me trouble  NEXT MD VISIT: prn  OBJECTIVE:   DIAGNOSTIC FINDINGS: na  PATIENT SURVEYS:  Initital eval: LEFS: 12/80 09/21/22:  LEFS: 63/80 10/03/22:  LEFS: 63/80 goal met   COGNITION: Overall cognitive status: Within functional limits for tasks assessed     SENSATION: WFL  POSTURE:  slightly fwd bent with min knee extension and short step length  PALPATION: Tender at peroneal tendon area  LOWER EXTREMITY ROM:  Active ROM Right eval Left eval Left 09/21/22 Left 10/03/22  Ankle dorsiflexion  Neutral (0) 10 14  Ankle plantarflexion  20 32 32  Ankle  inversion  20 28 30   Ankle eversion  5 18 24    (Blank rows = not tested)  LOWER EXTREMITY MMT:  MMT Right eval Left eval Left 09/21/22 Left 10/03/22  Ankle dorsiflexion  5 5 5   Ankle plantarflexion  5- 5 5  Ankle inversion  4 4+ 5  Ankle eversion  3+ 4+ 4+   (Blank rows = not tested)  LOWER EXTREMITY SPECIAL TESTS:  NA  FUNCTIONAL TESTS:  Initial eval: 5 times sit to stand: 14.86 sec Timed up and go (TUG): 10.93 sec  09/21/22: 5 times sit to stand: 10.58 sec Timed up and go (TUG): 8.84  sec  10/03/22: 5 times sit to stand: 10.06 sec Timed up and go (TUG): 7.29  sec  GAIT: Distance walked: 30 Assistive device utilized: Single point cane and None Level of assistance: Modified independence Comments: Typically does not use any a.d but just bought a cane.  I have it in case I need it.     TODAY'S TREATMENT:  DATE:  09/28/22 DC assessment completed Reviewed all of HEP and updated - printouts provided.   DC plan established  DATE:  09/28/22 TherEx Recumbent bike L1 x5 min Sit to stand 10# 2x10 Deadlift 10# 2x10 Single leg heel raise 2x10 R&L TherAct Runner's step up 6" step 5# x10 Step down off 4" step 2x10 Golfer's lift 2x10 Farmer's carry 2 x6# weights x 2 laps around gym Luggage carry 6# x 1 lap each around gym  DATE:  09/24/22 TherEx Recumbent bike L1 x 5 min Gastroc stretch on step 2x30 sec B Soleus stretch on step 2x30 sec Ankle DF on step with forward knee flexion x10 L Eccentric heel raise x10 Right foot on step heel raise on L 2x10 TherAct Runner's step up 6" step 2x10 Resisted backwards walking 15# x10 Resisted side walking 15# x10 NMR Alternating step tap forward on 6" step 2x10 standing on foam Step tap sideways 6" step 2x10 standing on foam Staggered stance on foam 2x30 sec L LE back Feet together eyes closed 2x30  sec  08/09/22 Initiated HEP, Initial eval completed   PATIENT EDUCATION:  Education details: Initiated HEP Person educated: Patient Education method: Programmer, multimedia, Facilities manager, Verbal cues, and Handouts Education comprehension: verbalized understanding and returned demonstration  HOME EXERCISE PROGRAM: Access Code: [email or web address] URL: [email or web address] Date: 10/03/2022 Prepared by: Mikey Kirschner  Exercises - Seated Ankle Alphabet  - 1 x daily - 7 x weekly - 3 sets - 10 reps - Long Sitting Calf Stretch with Strap  - 1 x daily - 7 x weekly - 3 sets - 10 reps - Seated Ankle Inversion Eversion PROM  - 1 x daily - 7 x weekly - 3 sets - 10 reps - Seated Heel Raise  - 1 x daily - 7 x weekly - 3 sets - 10 reps - Seated Toe Raise  - 1 x daily - 7 x weekly - 3 sets - 10 reps - Gastroc Stretch on Wall  - 1 x daily - 7 x weekly - 3 sets - 10 reps - Standing Soleus Stretch  - 1 x daily - 7 x weekly - 3 sets - 10 reps - Long Sitting Ankle Eversion with Resistance  - 1 x daily - 7 x weekly - 3 sets - 10 reps - Long Sitting Ankle Plantar Flexion with Resistance  - 1 x daily - 7 x weekly - 3 sets - 10 reps - Long Sitting Ankle Inversion with Resistance  - 1 x daily - 7 x weekly - 3 sets - 10 reps - Long Sitting Ankle Dorsiflexion with Anchored Resistance  - 1 x daily - 7 x weekly - 3 sets - 10 reps - Standing Single Leg Stance with Counter Support  - 1 x daily - 7 x weekly - 3 sets - 10 reps ASSESSMENT:  CLINICAL IMPRESSION: Kyrah has met all goals including newer goals.  All objective findings have improved even in the past 2 weeks.  She is compliant and well motivated with her HEP. She should continue to do very well.  We will DC at this time.     OBJECTIVE IMPAIRMENTS: Abnormal gait, decreased balance, decreased mobility, difficulty walking, decreased ROM, decreased strength, hypomobility, increased edema, increased fascial restrictions, increased muscle spasms, impaired  flexibility, and pain.    GOALS: Goals reviewed with patient? Yes  SHORT TERM GOALS: Target date: 09/06/2022  Pain report to be no greater than 4/10  Baseline: Goal status: MET 09/05/22  2.  Patient will  be independent with initial HEP  Baseline:  Goal status: MET 09/05/22   LONG TERM GOALS: Target date: 10/04/2022   Patient to report pain no greater than 2/10  Baseline:  Goal status: MET 09/14/22  2.  Patient to be independent with advanced HEP  Baseline:  Goal status: MET 09/21/22  3.  Patient to improve by 2-3 seconds on 5 times sit to stand and TUG Baseline:  Goal status: MET 09/21/22  4.  Ankle DF to improve to 10-15 degrees Baseline:  Goal status: MET  5.  Patient to be able to ascend and descend steps with reciprocal gait Baseline:  Goal status: MET 09/19/22  6.  LEFS score to improve by 2 points Baseline:  Goal status: MET 09/21/22  7.  Patient to be able to do reciprocal gait on steps with confidence and safely.   Baseline:  She currently hesitates to visit her grandkids due to the steps. Goal status: MET 10/03/22  8. Patient to be able to bend stoop and squat with increased ease to be able to pick up things from the floor. Baseline:  Difficulty with picking up objects from the floor while cleaning house Goal status: MET 10/03/22   PLAN:  PT FREQUENCY: 1-2x/week  PT DURATION: 8 weeks  PLANNED INTERVENTIONS: Therapeutic exercises, Therapeutic activity, Neuromuscular re-education, Balance training, Gait training, Patient/Family education, Self Care, Joint mobilization, Stair training, Orthotic/Fit training, DME instructions, Aquatic Therapy, Dry Needling, Electrical stimulation, Cryotherapy, Moist heat, Compression bandaging, scar mobilization, Splintting, Taping, Vasopneumatic device, Ultrasound, Ionotophoresis 4mg /ml Dexamethasone, Manual therapy, and Re-evaluation  PLAN FOR NEXT SESSION: DC  Larenda Reedy B. Neira Bentsen, PT 10/03/22 11:28 AM [facility] [address], [address] [place], [place] [address] Phone # [phone] Fax [phone]

## 2022-10-05 ENCOUNTER — Ambulatory Visit: Payer: 59

## 2022-10-26 ENCOUNTER — Other Ambulatory Visit: Payer: Self-pay

## 2022-10-26 ENCOUNTER — Other Ambulatory Visit: Payer: Self-pay | Admitting: Internal Medicine

## 2022-11-09 ENCOUNTER — Ambulatory Visit (HOSPITAL_COMMUNITY)
Admission: RE | Admit: 2022-11-09 | Discharge: 2022-11-09 | Disposition: A | Discharge status: Home / Self Care | Payer: 59 | Source: Ambulatory Visit | Attending: Family | Admitting: Family

## 2022-11-09 ENCOUNTER — Ambulatory Visit (INDEPENDENT_AMBULATORY_CARE_PROVIDER_SITE_OTHER): Payer: 59 | Admitting: Family

## 2022-11-09 VITALS — BP 140/90 | HR 72 | Temp 97.8°F | Ht 59.0 in | Wt 188.4 lb

## 2022-11-09 DIAGNOSIS — M7989 Other specified soft tissue disorders: Secondary | ICD-10-CM

## 2022-11-09 NOTE — Progress Notes (Signed)
Patient ID: Brandi Shaffer, female    DOB: 03-28-1945, 77 y.o.   MRN: [id number]  Chief Complaint  Patient presents with   Edema    Pt in the office for LLE edema and painful to touch; o hx of blood clots but previous fractures and surgery on LE.     HPI:      Left leg/ankle pain:  pt reports left leg pain and seen by her PCP,  sent to Ortho for PT and did that for 4 weeks and she finished in August, which helped, but now reports having swelling on left lower leg with pain. She states it is tender to touch, but no redness. She has hx of fracture years ago in left ankle and has "hardware" left in.  Assessment & Plan:  1. Left leg swelling- ordering stat U/S to r/o DVT, advised to keep leg elevated as much as possible, wear mild compression sock that covers the foot up to the knee, can go to [facility] medical supply and they can measure & order correct size, or [facility] has a store also. If no DVT, advised pt take her Lasix the next 3 mornings, be sure to hydrate well, avoid extra sodium. RTO w/PCP precautions given if no improvement by next week.  - VAS [place] LOWER EXTREMITY VENOUS (DVT)  Subjective:    Outpatient Medications Prior to Visit  Medication Sig Dispense Refill   albuterol (VENTOLIN HFA) 108 (90 Base) MCG/ACT inhaler INHALE 2 PUFFS BY MOUTH EVERY 6 HOURS AS NEEDED FOR WHEEZE 8.5 each 3   amLODipine (NORVASC) 10 MG tablet TAKE 1 TABLET BY MOUTH EVERY DAY 90 tablet 3   ammonium lactate (LAC-HYDRIN) 12 % lotion APPLY 1 APPLICATION TOPICALLY AS NEEDED FOR DRY SKIN. 400 mL 5   APPLE CIDER VINEGAR PO Take by mouth.     aspirin 81 MG EC tablet TAKE 1 TABLET BY MOUTH DAILY. SWALLOW WHOLE. 30 tablet 11   chlorhexidine (PERIDEX) 0.12 % solution Use as directed 15 mLs in the mouth or throat 2 (two) times daily. 473 mL 11   Cholecalciferol 50 MCG (2000 UT) TABS 1 tab by mouth once daily 30 tablet 99   clotrimazole (LOTRIMIN) 1 % cream Apply to affected feet and between toes twice daily for 6  weeks for athlete's feet 60 g 1   furosemide (LASIX) 20 MG tablet Take 1 tablet (20 mg total) by mouth daily as needed. 90 tablet 3   losartan (COZAAR) 25 MG tablet Take 1 tablet (25 mg total) by mouth daily. 90 tablet 3   Misc Natural Products (CRANBERRY/PROBIOTIC PO) Take by mouth.     Multiple Vitamins-Minerals (WOMENS MULTIVITAMIN PO) Take 1 tablet by mouth daily.     OneTouch Delica Lancets 33G MISC 1 each by Does not apply route 2 (two) times daily. Use to check blood sugars twice a day Dx E11.9 200 each 11   ONETOUCH VERIO test strip USE 2 (TWO) TIMES DAILY. DX E11.9 100 strip 4   pioglitazone (ACTOS) 15 MG tablet TAKE 1 TABLET BY MOUTH EVERY DAY 90 tablet 3   potassium chloride (KLOR-CON) 10 MEQ tablet TAKE 1 TABLET BY MOUTH EVERY DAY 90 tablet 3   rosuvastatin (CRESTOR) 20 MG tablet Take 1 tablet (20 mg total) by mouth daily. 90 tablet 3   vitamin B-12 (CYANOCOBALAMIN) 1000 MCG tablet 1 tab by mouth mon - wed - thur 90 tablet 3   cyclobenzaprine (FLEXERIL) 5 MG tablet Take 1 tablet (5  mg total) by mouth 3 (three) times daily as needed for muscle spasms. (Patient not taking: Reported on 11/09/2022) 30 tablet 1   fluconazole (DIFLUCAN) 150 MG tablet Take 1 tablet (150 mg total) by mouth every three (3) days as needed. (Patient not taking: Reported on 11/09/2022) 2 tablet 1   amoxicillin-clavulanate (AUGMENTIN) 875-125 MG tablet Take 1 tablet by mouth 2 (two) times daily. (Patient not taking: Reported on 11/09/2022) 20 tablet 0   meloxicam (MOBIC) 15 MG tablet Take 1 tablet (15 mg total) by mouth daily. (Patient not taking: Reported on 11/09/2022) 30 tablet 0   No facility-administered medications prior to visit.   Past Medical History:  Diagnosis Date   ANEMIA-IRON DEFICIENCY 12/06/2009   Anxiety state 07/01/2015   Asthma 12/05/2011   DIABETES MELLITUS, TYPE II 09/17/2006   DIVERTICULOSIS, COLON 12/06/2009   GENITAL HERPES 03/24/2010   GERD (gastroesophageal reflux disease) 06/28/2011    HYPERLIPIDEMIA 03/24/2010   HYPERTENSION 09/17/2006   HYPOTHYROIDISM 09/17/2006   LIPOMA 12/06/2009   PERIPHERAL EDEMA 03/24/2010   Renal cyst 07/05/2011   1.9 cm minimally complex   Past Surgical History:  Procedure Laterality Date   ABDOMINAL HYSTERECTOMY     fibroids   ankle surgury     x 3 left - chronic pain/swelling   COLONOSCOPY  2011   SHOULDER SURGERY Left    TONSILLECTOMY     TUBAL LIGATION     Allergies  Allergen Reactions   Januvia [Sitagliptin Phosphate]     dizziness   Lipitor [Atorvastatin] Other (See Comments)    Myalgia    Metformin And Related Nausea Only   Doxycycline Rash      Objective:    Physical Exam Vitals and nursing note reviewed.  Constitutional:      Appearance: Normal appearance.  Cardiovascular:     Rate and Rhythm: Normal rate and regular rhythm.  Pulmonary:     Effort: Pulmonary effort is normal.     Breath sounds: Normal breath sounds.  Musculoskeletal:        General: Normal range of motion.     Right lower leg: No edema.     Left lower leg: 1+ Edema (left lower leg from calf down is tight, very tender to palpation, no erythema, uneven swelling noted below calf) present.  Skin:    General: Skin is warm and dry.  Neurological:     Mental Status: She is alert.  Psychiatric:        Mood and Affect: Mood normal.        Behavior: Behavior normal.    BP (!) 142/98 (BP Location: Left Arm, Patient Position: Sitting)   Pulse 72   Temp 97.8 F (36.6 C) (Temporal)   Ht 4\' 11"  (1.499 m)   Wt 188 lb 6.4 oz (85.5 kg)   SpO2 98%   BMI 38.05 kg/m  Wt Readings from Last 3 Encounters:  11/09/22 188 lb 6.4 oz (85.5 kg)  09/24/22 189 lb (85.7 kg)  09/14/22 186 lb (84.4 kg)      Dulce Sellar, NP

## 2022-11-09 NOTE — Patient Instructions (Addendum)
It was very nice to see you today!   I am sending you to have an ultrasound done on your leg to rule out a clot. If this is negative, you should take the Lasix (Furosemide) one pill in the morning for 3 days and see if this helps the swelling and pain. Remember to keep your leg elevated whenever you are sitting. Wear the compression sock - should be mild compression and it MUST cover your foot and leg up to your knee! You can go to [facility] on [address] and they should be able to measure your legs to order the right size.   Follow up with Dr Jonny Ruiz if you continue to have pain.       PLEASE NOTE:  If you had any lab tests please let us know if you have not heard back within a few days. You may see your results on MyChart before we have a chance to review them but we will give you a call once they are reviewed by [place]. If we ordered any referrals today, please let us know if you have not heard from their office within the next week.

## 2022-11-12 ENCOUNTER — Telehealth: Payer: Self-pay | Admitting: Internal Medicine

## 2022-11-12 DIAGNOSIS — L03116 Cellulitis of left lower limb: Secondary | ICD-10-CM

## 2022-11-12 MED ORDER — CEPHALEXIN 500 MG PO CAPS: 500.0000 mg | ORAL_CAPSULE | Freq: Three times a day (TID) | ORAL | 0 refills | Status: AC

## 2022-11-12 NOTE — Telephone Encounter (Signed)
Keflex sent to her [facility].

## 2022-11-12 NOTE — Telephone Encounter (Signed)
Patient called in stating she is still having symptoms following 9/13 OV and would like an abx called in. Please advise.

## 2022-12-10 ENCOUNTER — Ambulatory Visit: Payer: 59 | Admitting: Internal Medicine

## 2022-12-10 ENCOUNTER — Encounter: Payer: Self-pay | Admitting: Internal Medicine

## 2022-12-10 ENCOUNTER — Ambulatory Visit (INDEPENDENT_AMBULATORY_CARE_PROVIDER_SITE_OTHER)
Admission: RE | Admit: 2022-12-10 | Discharge: 2022-12-10 | Disposition: A | Discharge status: Home / Self Care | Payer: 59 | Source: Ambulatory Visit | Attending: Internal Medicine

## 2022-12-10 VITALS — BP 144/76 | HR 83 | Temp 97.6°F | Ht 59.0 in | Wt 189.4 lb

## 2022-12-10 DIAGNOSIS — M79662 Pain in left lower leg: Secondary | ICD-10-CM | POA: Diagnosis not present

## 2022-12-10 DIAGNOSIS — M19072 Primary osteoarthritis, left ankle and foot: Secondary | ICD-10-CM | POA: Diagnosis not present

## 2022-12-10 DIAGNOSIS — R3589 Other polyuria: Secondary | ICD-10-CM | POA: Diagnosis not present

## 2022-12-10 DIAGNOSIS — L03116 Cellulitis of left lower limb: Secondary | ICD-10-CM | POA: Diagnosis not present

## 2022-12-10 DIAGNOSIS — M7989 Other specified soft tissue disorders: Secondary | ICD-10-CM | POA: Diagnosis not present

## 2022-12-10 DIAGNOSIS — S8252XA Displaced fracture of medial malleolus of left tibia, initial encounter for closed fracture: Secondary | ICD-10-CM | POA: Diagnosis not present

## 2022-12-10 LAB — SEDIMENTATION RATE: Sed Rate: 30 mm/h (ref 0–30)

## 2022-12-10 LAB — CBC WITH DIFFERENTIAL/PLATELET
Basophils Absolute: 0.1 10*3/uL (ref 0.0–0.1)
Basophils Relative: 1.3 % (ref 0.0–3.0)
Eosinophils Absolute: 0.2 10*3/uL (ref 0.0–0.7)
Eosinophils Relative: 2.8 % (ref 0.0–5.0)
HCT: 40.3 % (ref 36.0–46.0)
Hemoglobin: 13.2 g/dL (ref 12.0–15.0)
Lymphocytes Relative: 38.7 % (ref 12.0–46.0)
Lymphs Abs: 2.1 10*3/uL (ref 0.7–4.0)
MCHC: 32.8 g/dL (ref 30.0–36.0)
MCV: 92.7 fL (ref 78.0–100.0)
Monocytes Absolute: 0.4 10*3/uL (ref 0.1–1.0)
Monocytes Relative: 7.8 % (ref 3.0–12.0)
Neutro Abs: 2.7 10*3/uL (ref 1.4–7.7)
Neutrophils Relative %: 49.4 % (ref 43.0–77.0)
Platelets: 278 10*3/uL (ref 150.0–400.0)
RBC: 4.35 Mil/uL (ref 3.87–5.11)
RDW: 14 % (ref 11.5–15.5)
WBC: 5.5 10*3/uL (ref 4.0–10.5)

## 2022-12-10 LAB — COMPREHENSIVE METABOLIC PANEL
ALT: 13 U/L (ref 0–35)
AST: 18 U/L (ref 0–37)
Albumin: 4.3 g/dL (ref 3.5–5.2)
Alkaline Phosphatase: 112 U/L (ref 39–117)
BUN: 17 mg/dL (ref 6–23)
CO2: 26 meq/L (ref 19–32)
Calcium: 9.4 mg/dL (ref 8.4–10.5)
Chloride: 108 meq/L (ref 96–112)
Creatinine, Ser: 0.88 mg/dL (ref 0.40–1.20)
GFR: 63.14 mL/min (ref >60.00)
Glucose, Bld: 84 mg/dL (ref 70–99)
Potassium: 3.8 meq/L (ref 3.5–5.1)
Sodium: 142 meq/L (ref 135–145)
Total Bilirubin: 0.5 mg/dL (ref 0.2–1.2)
Total Protein: 7.4 g/dL (ref 6.0–8.3)

## 2022-12-10 LAB — C-REACTIVE PROTEIN: CRP: <1 mg/dL (ref 0.5–20.0)

## 2022-12-10 LAB — LIPID PANEL
Cholesterol: 193 mg/dL (ref 0–200)
HDL: 55.4 mg/dL (ref >39.00)
LDL Cholesterol: 118 mg/dL — ABNORMAL HIGH (ref 0–99)
NonHDL: 137.17
Total CHOL/HDL Ratio: 3
Triglycerides: 96 mg/dL (ref 0.0–149.0)
VLDL: 19.2 mg/dL (ref 0.0–40.0)

## 2022-12-10 LAB — CK: Total CK: 108 U/L (ref 7–177)

## 2022-12-10 LAB — URIC ACID: Uric Acid, Serum: 5.6 mg/dL (ref 2.4–7.0)

## 2022-12-10 MED ORDER — GABAPENTIN 100 MG PO CAPS
100.0000 mg | ORAL_CAPSULE | Freq: Three times a day (TID) | ORAL | 3 refills | Status: DC
Start: 2022-12-10 — End: 2023-09-05

## 2022-12-10 MED ORDER — PREDNISONE 20 MG PO TABS: ORAL_TABLET | ORAL | 0 refills | Status: DC

## 2022-12-10 MED ORDER — SULFAMETHOXAZOLE-TRIMETHOPRIM 800-160 MG PO TABS
1.0000 | ORAL_TABLET | Freq: Two times a day (BID) | ORAL | 0 refills | Status: DC
Start: 2022-12-10 — End: 2022-12-17

## 2022-12-10 NOTE — Patient Instructions (Signed)
VISIT SUMMARY:  During your visit, we discussed your ongoing left leg discomfort and swelling, despite previous treatments. We also touched on your diabetes management and the persistent itching around your ankle. We have outlined a comprehensive plan to address these issues, which includes new medications, further diagnostic tests, and referrals to specialists.  YOUR PLAN:  -LEFT LEG PAIN AND SWELLING: Your left leg pain and swelling, despite a negative blood clot test, could be due to various reasons. We will continue with the compression stockings and start new medications for pain control, inflammation, and possible infection. We will also conduct further tests to check blood flow, kidney function, and muscle condition. Additionally, we will assess the hardware in your ankle through an X-ray. We will refer you to a Vascular specialist, an Orthopedic specialist, and a Dermatologist for further evaluation and possible biopsies.  -DIABETES: We will continue with your current diabetes management plan. The itching around your ankle could be due to a fungal infection, so we will apply a cream to treat this.  INSTRUCTIONS:  Please start taking the new medications as prescribed. Make sure to schedule and attend the recommended tests and specialist appointments. Continue using the compression stockings and the cream for your feet. Monitor your symptoms and report any changes or concerns. We will review the results of your tests and specialist evaluations at your next appointment.   It was a pleasure seeing you today! Your health and satisfaction are our top priorities.   Glenetta Hew, MD  Next Steps:  [x]  Flexible Follow-Up: We recommend a follow up with your primary care, a specialist, or me within 1-2 weeks for ensuring your problem resolves. This allows for progress monitoring and treatment adjustments. [x]  Early Intervention: Schedule sooner appointment, call our on-call services, or go to  emergency room if there is Increase in pain or discomfort New or worsening symptoms Sudden or severe changes in your health [x]  Lab & X-ray Appointments: complete or schedule to complete today, or call to schedule.  X-rays: [facility] [place] (M-F, 8:30am-noon or 1pm-5pm).  Making the Most of Our Focused (20 minute) Follow Up Appointments:  [x]   Clearly state your top concerns at the beginning of the visit to focus our discussion [x]   If you anticipate you will need more time, please inform the front desk during scheduling - we can book multiple appointments in the same week. [x]   If you have transportation problems- use our convenient video appointments or ask about transportation support. [x]   We can get down to business faster if you use MyChart to update information before the visit and submit non-urgent questions before your visit. Thank you for taking the time to provide details through MyChart.  Let our nurse know and she can import this information into your encounter documents.  Arrival and Wait Times: [x]   Arriving on time ensures that everyone receives prompt attention. [x]   Early morning (8a) and afternoon (1p) appointments tend to have shortest wait times. [x]   Unfortunately, we cannot delay appointments for late arrivals or hold slots during phone calls.  Getting Answers and Following Up  [x]   Simple Questions & Concerns: For quick questions or basic follow-up after your visit, reach [place] at [phone] or MyChart messaging. [x]   Complex Concerns: If your concern is more complex, scheduling an appointment might be best. Discuss this with the staff to find the most suitable option. [x]   Lab & Imaging Results: We'll contact you directly if results are abnormal or you  don't use MyChart. Most normal results will be on MyChart within 2-3 business days, with a review message from Dr. Jon Billings. Haven't heard back in 2 weeks? Need results sooner? Contact us at [phone]  [phone]. [x]   Referrals: Our referral coordinator will manage specialist referrals. The specialist's office should contact you within 2 weeks to schedule an appointment. Call us if you haven't heard from them after 2 weeks.  Staying Connected  [x]   MyChart: Activate your MyChart for the fastest way to access results and message [place]. See the last page of this paperwork for instructions on how to activate.  Bring to Your Next Appointment  [x]   Medications: Please bring all your medication bottles to your next appointment to ensure we have an accurate record of your prescriptions. [x]   Health Diaries: If you're monitoring any health conditions at home, keeping a diary of your readings can be very helpful for discussions at your next appointment.  Billing  [x]   X-ray & Lab Orders: These are billed by separate companies. Contact the invoicing company directly for questions or concerns. [x]   Visit Charges: Discuss any billing inquiries with our administrative services team.  Your Satisfaction Matters  [x]   Share Your Experience: We strive for your satisfaction! If you have any complaints, or preferably compliments, please let Dr. Jon Billings know directly or contact our Practice Administrators, Edwena Felty or [employer], by asking at the front desk.   Reviewing Your Records  [x]   Review this early draft of your clinical encounter notes below and the final encounter summary tomorrow on MyChart after its been completed.   Cellulitis of left lower leg -     Sulfamethoxazole-Trimethoprim; Take 1 tablet by mouth 2 (two) times daily.  Dispense: 14 tablet; Refill: 0 -     Gabapentin; Take 1 capsule (100 mg total) by mouth 3 (three) times daily.  Dispense: 90 capsule; Refill: 3 -     Ambulatory referral to Vascular Surgery -     Lipid panel -     Comprehensive metabolic panel -     CBC with Differential/Platelet -     TSH Rfx on Abnormal to Free T4 -     Lipid Panel w/reflex Direct LDL -      Culture, blood (single) w Reflex to ID Panel -     Sedimentation rate -     C-reactive protein -     predniSONE; Take 2 pills for 3 days, 1 pill for 4 days  Dispense: 10 tablet; Refill: 0 -     NCV with EMG(electromyography); Future -     CT ANGIO AO+BIFEM W & OR WO CONTRAST; Future -     Ambulatory referral to Dermatology -     DG Ankle Complete Left; Future -     CK -     Protein electrophoresis, serum -     Uric acid -     Cyclic citrul peptide antibody, IgG -     Rheumatoid factor -     Ambulatory referral to Orthopedic Surgery  Tenderness of left calf -     Sulfamethoxazole-Trimethoprim; Take 1 tablet by mouth 2 (two) times daily.  Dispense: 14 tablet; Refill: 0 -     Gabapentin; Take 1 capsule (100 mg total) by mouth 3 (three) times daily.  Dispense: 90 capsule; Refill: 3 -     Ambulatory referral to Vascular Surgery -     Lipid panel -     Comprehensive metabolic panel -  CBC with Differential/Platelet -     TSH Rfx on Abnormal to Free T4 -     Lipid Panel w/reflex Direct LDL -     Culture, blood (single) w Reflex to ID Panel -     Sedimentation rate -     C-reactive protein -     predniSONE; Take 2 pills for 3 days, 1 pill for 4 days  Dispense: 10 tablet; Refill: 0 -     NCV with EMG(electromyography); Future -     CT ANGIO AO+BIFEM W & OR WO CONTRAST; Future -     Ambulatory referral to Dermatology -     DG Ankle Complete Left; Future -     CK -     Protein electrophoresis, serum -     Uric acid -     Cyclic citrul peptide antibody, IgG -     Rheumatoid factor -     Ambulatory referral to Orthopedic Surgery  Left leg swelling -     Sulfamethoxazole-Trimethoprim; Take 1 tablet by mouth 2 (two) times daily.  Dispense: 14 tablet; Refill: 0 -     Gabapentin; Take 1 capsule (100 mg total) by mouth 3 (three) times daily.  Dispense: 90 capsule; Refill: 3 -     Ambulatory referral to Vascular Surgery -     Lipid panel -     Comprehensive metabolic panel -     CBC with  Differential/Platelet -     TSH Rfx on Abnormal to Free T4 -     Lipid Panel w/reflex Direct LDL -     Culture, blood (single) w Reflex to ID Panel -     Sedimentation rate -     C-reactive protein -     predniSONE; Take 2 pills for 3 days, 1 pill for 4 days  Dispense: 10 tablet; Refill: 0 -     NCV with EMG(electromyography); Future -     CT ANGIO AO+BIFEM W & OR WO CONTRAST; Future -     Ambulatory referral to Dermatology -     DG Ankle Complete Left; Future -     Ambulatory referral to Orthopedic Surgery  Polyuria -     Urinalysis, Routine w reflex microscopic -     Microalbumin / creatinine urine ratio

## 2022-12-10 NOTE — Progress Notes (Signed)
Basophils Relative 01/11/2022 0.8    Neutro Abs 01/11/2022 2.1    Lymphs Abs 01/11/2022 2.3    Monocytes Absolute 01/11/2022 0.5    Eosinophils Absolute 01/11/2022 0.2    Basophils Absolute 01/11/2022 0.0    TSH 01/11/2022 1.94    Color, Urine 01/11/2022 YELLOW    APPearance 01/11/2022 CLEAR    Specific Gravity, Urine 01/11/2022 1.015    pH 01/11/2022 7.0    Total Protein, Urine 01/11/2022 NEGATIVE    Urine Glucose 01/11/2022 NEGATIVE    Ketones, ur 01/11/2022 NEGATIVE    Bilirubin Urine 01/11/2022 NEGATIVE    Hgb urine dipstick 01/11/2022 NEGATIVE    Urobilinogen, UA 01/11/2022 0.2    Leukocytes,Ua 01/11/2022 TRACE (A)    Nitrite 01/11/2022 NEGATIVE    WBC, UA 01/11/2022 0-2/hpf    RBC / HPF 01/11/2022 none seen    Mucus, UA 01/11/2022 Presence of (A)    Squamous Epithelial / HPF 01/11/2022 Few(5-10/hpf) (A)    Sodium 01/11/2022 143    Potassium 01/11/2022 3.7    Chloride 01/11/2022 108    CO2 01/11/2022 29    Glucose, Bld 01/11/2022 87    BUN 01/11/2022 20    Creatinine, Ser 01/11/2022 0.88    GFR 01/11/2022 63.55    Calcium 01/11/2022 9.1    VITD 01/11/2022 33.64    Vitamin B-12 01/11/2022 1,421 (H)    No image results found.   VAS [place] LOWER EXTREMITY VENOUS (DVT)  Result Date: 11/09/2022  Lower Venous DVT Study Patient Name:  Brandi Shaffer  Date of Exam:   11/09/2022 Medical Rec #:  [id number]       Accession #:    [id number] Date of Birth: November 03, 1945      Patient Gender: F Patient Age:   77 years Exam Location:  [facility] Procedure:      VAS [place] LOWER EXTREMITY VENOUS (DVT) Referring Phys: STEPHANIE HUDNELL --------------------------------------------------------------------------------  Indications: Swelling.  Comparison Study: Previous exam on 11/11/2014 was negative for DVT Performing Technologist: Ernestene Mention RVT, RDMS  Examination Guidelines: A complete evaluation includes B-mode imaging, spectral Doppler, color Doppler, and power Doppler as needed of all accessible portions of each vessel. Bilateral testing is considered an integral part of a complete examination. Limited examinations for reoccurring indications may be performed as noted. The reflux portion of the exam is performed with the patient in reverse Trendelenburg.  +-----+---------------+---------+-----------+----------+--------------+ RIGHTCompressibilityPhasicitySpontaneityPropertiesThrombus Aging +-----+---------------+---------+-----------+----------+--------------+ CFV  Full           Yes      Yes                                 +-----+---------------+---------+-----------+----------+--------------+   +---------+---------------+---------+-----------+----------+-------------------+ LEFT     CompressibilityPhasicitySpontaneityPropertiesThrombus Aging      +---------+---------------+---------+-----------+----------+-------------------+ CFV      Full           Yes      Yes                                      +---------+---------------+---------+-----------+----------+-------------------+ SFJ      Full                                                             +---------+---------------+---------+-----------+----------+-------------------+  Basophils Relative 01/11/2022 0.8    Neutro Abs 01/11/2022 2.1    Lymphs Abs 01/11/2022 2.3    Monocytes Absolute 01/11/2022 0.5    Eosinophils Absolute 01/11/2022 0.2    Basophils Absolute 01/11/2022 0.0    TSH 01/11/2022 1.94    Color, Urine 01/11/2022 YELLOW    APPearance 01/11/2022 CLEAR    Specific Gravity, Urine 01/11/2022 1.015    pH 01/11/2022 7.0    Total Protein, Urine 01/11/2022 NEGATIVE    Urine Glucose 01/11/2022 NEGATIVE    Ketones, ur 01/11/2022 NEGATIVE    Bilirubin Urine 01/11/2022 NEGATIVE    Hgb urine dipstick 01/11/2022 NEGATIVE    Urobilinogen, UA 01/11/2022 0.2    Leukocytes,Ua 01/11/2022 TRACE (A)    Nitrite 01/11/2022 NEGATIVE    WBC, UA 01/11/2022 0-2/hpf    RBC / HPF 01/11/2022 none seen    Mucus, UA 01/11/2022 Presence of (A)    Squamous Epithelial / HPF 01/11/2022 Few(5-10/hpf) (A)    Sodium 01/11/2022 143    Potassium 01/11/2022 3.7    Chloride 01/11/2022 108    CO2 01/11/2022 29    Glucose, Bld 01/11/2022 87    BUN 01/11/2022 20    Creatinine, Ser 01/11/2022 0.88    GFR 01/11/2022 63.55    Calcium 01/11/2022 9.1    VITD 01/11/2022 33.64    Vitamin B-12 01/11/2022 1,421 (H)    No image results found.   VAS [place] LOWER EXTREMITY VENOUS (DVT)  Result Date: 11/09/2022  Lower Venous DVT Study Patient Name:  Brandi Shaffer  Date of Exam:   11/09/2022 Medical Rec #:  [id number]       Accession #:    [id number] Date of Birth: November 03, 1945      Patient Gender: F Patient Age:   77 years Exam Location:  [facility] Procedure:      VAS [place] LOWER EXTREMITY VENOUS (DVT) Referring Phys: STEPHANIE HUDNELL --------------------------------------------------------------------------------  Indications: Swelling.  Comparison Study: Previous exam on 11/11/2014 was negative for DVT Performing Technologist: Ernestene Mention RVT, RDMS  Examination Guidelines: A complete evaluation includes B-mode imaging, spectral Doppler, color Doppler, and power Doppler as needed of all accessible portions of each vessel. Bilateral testing is considered an integral part of a complete examination. Limited examinations for reoccurring indications may be performed as noted. The reflux portion of the exam is performed with the patient in reverse Trendelenburg.  +-----+---------------+---------+-----------+----------+--------------+ RIGHTCompressibilityPhasicitySpontaneityPropertiesThrombus Aging +-----+---------------+---------+-----------+----------+--------------+ CFV  Full           Yes      Yes                                 +-----+---------------+---------+-----------+----------+--------------+   +---------+---------------+---------+-----------+----------+-------------------+ LEFT     CompressibilityPhasicitySpontaneityPropertiesThrombus Aging      +---------+---------------+---------+-----------+----------+-------------------+ CFV      Full           Yes      Yes                                      +---------+---------------+---------+-----------+----------+-------------------+ SFJ      Full                                                             +---------+---------------+---------+-----------+----------+-------------------+  Basophils Relative 01/11/2022 0.8    Neutro Abs 01/11/2022 2.1    Lymphs Abs 01/11/2022 2.3    Monocytes Absolute 01/11/2022 0.5    Eosinophils Absolute 01/11/2022 0.2    Basophils Absolute 01/11/2022 0.0    TSH 01/11/2022 1.94    Color, Urine 01/11/2022 YELLOW    APPearance 01/11/2022 CLEAR    Specific Gravity, Urine 01/11/2022 1.015    pH 01/11/2022 7.0    Total Protein, Urine 01/11/2022 NEGATIVE    Urine Glucose 01/11/2022 NEGATIVE    Ketones, ur 01/11/2022 NEGATIVE    Bilirubin Urine 01/11/2022 NEGATIVE    Hgb urine dipstick 01/11/2022 NEGATIVE    Urobilinogen, UA 01/11/2022 0.2    Leukocytes,Ua 01/11/2022 TRACE (A)    Nitrite 01/11/2022 NEGATIVE    WBC, UA 01/11/2022 0-2/hpf    RBC / HPF 01/11/2022 none seen    Mucus, UA 01/11/2022 Presence of (A)    Squamous Epithelial / HPF 01/11/2022 Few(5-10/hpf) (A)    Sodium 01/11/2022 143    Potassium 01/11/2022 3.7    Chloride 01/11/2022 108    CO2 01/11/2022 29    Glucose, Bld 01/11/2022 87    BUN 01/11/2022 20    Creatinine, Ser 01/11/2022 0.88    GFR 01/11/2022 63.55    Calcium 01/11/2022 9.1    VITD 01/11/2022 33.64    Vitamin B-12 01/11/2022 1,421 (H)    No image results found.   VAS [place] LOWER EXTREMITY VENOUS (DVT)  Result Date: 11/09/2022  Lower Venous DVT Study Patient Name:  Brandi Shaffer  Date of Exam:   11/09/2022 Medical Rec #:  [id number]       Accession #:    [id number] Date of Birth: November 03, 1945      Patient Gender: F Patient Age:   77 years Exam Location:  [facility] Procedure:      VAS [place] LOWER EXTREMITY VENOUS (DVT) Referring Phys: STEPHANIE HUDNELL --------------------------------------------------------------------------------  Indications: Swelling.  Comparison Study: Previous exam on 11/11/2014 was negative for DVT Performing Technologist: Ernestene Mention RVT, RDMS  Examination Guidelines: A complete evaluation includes B-mode imaging, spectral Doppler, color Doppler, and power Doppler as needed of all accessible portions of each vessel. Bilateral testing is considered an integral part of a complete examination. Limited examinations for reoccurring indications may be performed as noted. The reflux portion of the exam is performed with the patient in reverse Trendelenburg.  +-----+---------------+---------+-----------+----------+--------------+ RIGHTCompressibilityPhasicitySpontaneityPropertiesThrombus Aging +-----+---------------+---------+-----------+----------+--------------+ CFV  Full           Yes      Yes                                 +-----+---------------+---------+-----------+----------+--------------+   +---------+---------------+---------+-----------+----------+-------------------+ LEFT     CompressibilityPhasicitySpontaneityPropertiesThrombus Aging      +---------+---------------+---------+-----------+----------+-------------------+ CFV      Full           Yes      Yes                                      +---------+---------------+---------+-----------+----------+-------------------+ SFJ      Full                                                             +---------+---------------+---------+-----------+----------+-------------------+  Basophils Relative 01/11/2022 0.8    Neutro Abs 01/11/2022 2.1    Lymphs Abs 01/11/2022 2.3    Monocytes Absolute 01/11/2022 0.5    Eosinophils Absolute 01/11/2022 0.2    Basophils Absolute 01/11/2022 0.0    TSH 01/11/2022 1.94    Color, Urine 01/11/2022 YELLOW    APPearance 01/11/2022 CLEAR    Specific Gravity, Urine 01/11/2022 1.015    pH 01/11/2022 7.0    Total Protein, Urine 01/11/2022 NEGATIVE    Urine Glucose 01/11/2022 NEGATIVE    Ketones, ur 01/11/2022 NEGATIVE    Bilirubin Urine 01/11/2022 NEGATIVE    Hgb urine dipstick 01/11/2022 NEGATIVE    Urobilinogen, UA 01/11/2022 0.2    Leukocytes,Ua 01/11/2022 TRACE (A)    Nitrite 01/11/2022 NEGATIVE    WBC, UA 01/11/2022 0-2/hpf    RBC / HPF 01/11/2022 none seen    Mucus, UA 01/11/2022 Presence of (A)    Squamous Epithelial / HPF 01/11/2022 Few(5-10/hpf) (A)    Sodium 01/11/2022 143    Potassium 01/11/2022 3.7    Chloride 01/11/2022 108    CO2 01/11/2022 29    Glucose, Bld 01/11/2022 87    BUN 01/11/2022 20    Creatinine, Ser 01/11/2022 0.88    GFR 01/11/2022 63.55    Calcium 01/11/2022 9.1    VITD 01/11/2022 33.64    Vitamin B-12 01/11/2022 1,421 (H)    No image results found.   VAS [place] LOWER EXTREMITY VENOUS (DVT)  Result Date: 11/09/2022  Lower Venous DVT Study Patient Name:  Brandi Shaffer  Date of Exam:   11/09/2022 Medical Rec #:  [id number]       Accession #:    [id number] Date of Birth: November 03, 1945      Patient Gender: F Patient Age:   77 years Exam Location:  [facility] Procedure:      VAS [place] LOWER EXTREMITY VENOUS (DVT) Referring Phys: STEPHANIE HUDNELL --------------------------------------------------------------------------------  Indications: Swelling.  Comparison Study: Previous exam on 11/11/2014 was negative for DVT Performing Technologist: Ernestene Mention RVT, RDMS  Examination Guidelines: A complete evaluation includes B-mode imaging, spectral Doppler, color Doppler, and power Doppler as needed of all accessible portions of each vessel. Bilateral testing is considered an integral part of a complete examination. Limited examinations for reoccurring indications may be performed as noted. The reflux portion of the exam is performed with the patient in reverse Trendelenburg.  +-----+---------------+---------+-----------+----------+--------------+ RIGHTCompressibilityPhasicitySpontaneityPropertiesThrombus Aging +-----+---------------+---------+-----------+----------+--------------+ CFV  Full           Yes      Yes                                 +-----+---------------+---------+-----------+----------+--------------+   +---------+---------------+---------+-----------+----------+-------------------+ LEFT     CompressibilityPhasicitySpontaneityPropertiesThrombus Aging      +---------+---------------+---------+-----------+----------+-------------------+ CFV      Full           Yes      Yes                                      +---------+---------------+---------+-----------+----------+-------------------+ SFJ      Full                                                             +---------+---------------+---------+-----------+----------+-------------------+  FV Prox  Full           Yes      Yes                                       +---------+---------------+---------+-----------+----------+-------------------+ FV Mid   Full           Yes      Yes                                      +---------+---------------+---------+-----------+----------+-------------------+ FV DistalFull           Yes      Yes                                      +---------+---------------+---------+-----------+----------+-------------------+ PFV      Full                                                             +---------+---------------+---------+-----------+----------+-------------------+ POP      Full           Yes      Yes                                      +---------+---------------+---------+-----------+----------+-------------------+ PTV      Full                                         Not well visualized +---------+---------------+---------+-----------+----------+-------------------+ PERO     Full                                         Not well visualized +---------+---------------+---------+-----------+----------+-------------------+    Summary: RIGHT: - No evidence of common femoral vein obstruction.   LEFT: - There is no evidence of deep vein thrombosis in the lower extremity.  - No cystic structure found in the popliteal fossa. Subcutaneous edema seen in area of calf (lateral).  *See table(s) above for measurements and observations. Electronically signed by Sherald Hess MD on 11/09/2022 at 4:02:22 PM.    Final     VAS [place] LOWER EXTREMITY VENOUS (DVT)  Result Date: 11/09/2022  Lower Venous DVT Study Patient Name:  AUBRINA NIEMAN  Date of Exam:   11/09/2022 Medical Rec #: [id number]       Accession #:    [id number] Date of Birth: 1945-07-31      Patient Gender: F Patient Age:   29 years Exam Location:  [facility] Procedure:      VAS [place] LOWER EXTREMITY VENOUS (DVT) Referring Phys: STEPHANIE HUDNELL --------------------------------------------------------------------------------  Indications:  Swelling.  Comparison Study: Previous exam on 11/11/2014 was negative for DVT Performing Technologist: Ernestene Mention RVT, RDMS  Examination Guidelines: A complete evaluation includes B-mode imaging, spectral Doppler, color  Basophils Relative 01/11/2022 0.8    Neutro Abs 01/11/2022 2.1    Lymphs Abs 01/11/2022 2.3    Monocytes Absolute 01/11/2022 0.5    Eosinophils Absolute 01/11/2022 0.2    Basophils Absolute 01/11/2022 0.0    TSH 01/11/2022 1.94    Color, Urine 01/11/2022 YELLOW    APPearance 01/11/2022 CLEAR    Specific Gravity, Urine 01/11/2022 1.015    pH 01/11/2022 7.0    Total Protein, Urine 01/11/2022 NEGATIVE    Urine Glucose 01/11/2022 NEGATIVE    Ketones, ur 01/11/2022 NEGATIVE    Bilirubin Urine 01/11/2022 NEGATIVE    Hgb urine dipstick 01/11/2022 NEGATIVE    Urobilinogen, UA 01/11/2022 0.2    Leukocytes,Ua 01/11/2022 TRACE (A)    Nitrite 01/11/2022 NEGATIVE    WBC, UA 01/11/2022 0-2/hpf    RBC / HPF 01/11/2022 none seen    Mucus, UA 01/11/2022 Presence of (A)    Squamous Epithelial / HPF 01/11/2022 Few(5-10/hpf) (A)    Sodium 01/11/2022 143    Potassium 01/11/2022 3.7    Chloride 01/11/2022 108    CO2 01/11/2022 29    Glucose, Bld 01/11/2022 87    BUN 01/11/2022 20    Creatinine, Ser 01/11/2022 0.88    GFR 01/11/2022 63.55    Calcium 01/11/2022 9.1    VITD 01/11/2022 33.64    Vitamin B-12 01/11/2022 1,421 (H)    No image results found.   VAS [place] LOWER EXTREMITY VENOUS (DVT)  Result Date: 11/09/2022  Lower Venous DVT Study Patient Name:  Brandi Shaffer  Date of Exam:   11/09/2022 Medical Rec #:  [id number]       Accession #:    [id number] Date of Birth: November 03, 1945      Patient Gender: F Patient Age:   77 years Exam Location:  [facility] Procedure:      VAS [place] LOWER EXTREMITY VENOUS (DVT) Referring Phys: STEPHANIE HUDNELL --------------------------------------------------------------------------------  Indications: Swelling.  Comparison Study: Previous exam on 11/11/2014 was negative for DVT Performing Technologist: Ernestene Mention RVT, RDMS  Examination Guidelines: A complete evaluation includes B-mode imaging, spectral Doppler, color Doppler, and power Doppler as needed of all accessible portions of each vessel. Bilateral testing is considered an integral part of a complete examination. Limited examinations for reoccurring indications may be performed as noted. The reflux portion of the exam is performed with the patient in reverse Trendelenburg.  +-----+---------------+---------+-----------+----------+--------------+ RIGHTCompressibilityPhasicitySpontaneityPropertiesThrombus Aging +-----+---------------+---------+-----------+----------+--------------+ CFV  Full           Yes      Yes                                 +-----+---------------+---------+-----------+----------+--------------+   +---------+---------------+---------+-----------+----------+-------------------+ LEFT     CompressibilityPhasicitySpontaneityPropertiesThrombus Aging      +---------+---------------+---------+-----------+----------+-------------------+ CFV      Full           Yes      Yes                                      +---------+---------------+---------+-----------+----------+-------------------+ SFJ      Full                                                             +---------+---------------+---------+-----------+----------+-------------------+  Basophils Relative 01/11/2022 0.8    Neutro Abs 01/11/2022 2.1    Lymphs Abs 01/11/2022 2.3    Monocytes Absolute 01/11/2022 0.5    Eosinophils Absolute 01/11/2022 0.2    Basophils Absolute 01/11/2022 0.0    TSH 01/11/2022 1.94    Color, Urine 01/11/2022 YELLOW    APPearance 01/11/2022 CLEAR    Specific Gravity, Urine 01/11/2022 1.015    pH 01/11/2022 7.0    Total Protein, Urine 01/11/2022 NEGATIVE    Urine Glucose 01/11/2022 NEGATIVE    Ketones, ur 01/11/2022 NEGATIVE    Bilirubin Urine 01/11/2022 NEGATIVE    Hgb urine dipstick 01/11/2022 NEGATIVE    Urobilinogen, UA 01/11/2022 0.2    Leukocytes,Ua 01/11/2022 TRACE (A)    Nitrite 01/11/2022 NEGATIVE    WBC, UA 01/11/2022 0-2/hpf    RBC / HPF 01/11/2022 none seen    Mucus, UA 01/11/2022 Presence of (A)    Squamous Epithelial / HPF 01/11/2022 Few(5-10/hpf) (A)    Sodium 01/11/2022 143    Potassium 01/11/2022 3.7    Chloride 01/11/2022 108    CO2 01/11/2022 29    Glucose, Bld 01/11/2022 87    BUN 01/11/2022 20    Creatinine, Ser 01/11/2022 0.88    GFR 01/11/2022 63.55    Calcium 01/11/2022 9.1    VITD 01/11/2022 33.64    Vitamin B-12 01/11/2022 1,421 (H)    No image results found.   VAS [place] LOWER EXTREMITY VENOUS (DVT)  Result Date: 11/09/2022  Lower Venous DVT Study Patient Name:  Brandi Shaffer  Date of Exam:   11/09/2022 Medical Rec #:  [id number]       Accession #:    [id number] Date of Birth: November 03, 1945      Patient Gender: F Patient Age:   77 years Exam Location:  [facility] Procedure:      VAS [place] LOWER EXTREMITY VENOUS (DVT) Referring Phys: STEPHANIE HUDNELL --------------------------------------------------------------------------------  Indications: Swelling.  Comparison Study: Previous exam on 11/11/2014 was negative for DVT Performing Technologist: Ernestene Mention RVT, RDMS  Examination Guidelines: A complete evaluation includes B-mode imaging, spectral Doppler, color Doppler, and power Doppler as needed of all accessible portions of each vessel. Bilateral testing is considered an integral part of a complete examination. Limited examinations for reoccurring indications may be performed as noted. The reflux portion of the exam is performed with the patient in reverse Trendelenburg.  +-----+---------------+---------+-----------+----------+--------------+ RIGHTCompressibilityPhasicitySpontaneityPropertiesThrombus Aging +-----+---------------+---------+-----------+----------+--------------+ CFV  Full           Yes      Yes                                 +-----+---------------+---------+-----------+----------+--------------+   +---------+---------------+---------+-----------+----------+-------------------+ LEFT     CompressibilityPhasicitySpontaneityPropertiesThrombus Aging      +---------+---------------+---------+-----------+----------+-------------------+ CFV      Full           Yes      Yes                                      +---------+---------------+---------+-----------+----------+-------------------+ SFJ      Full                                                             +---------+---------------+---------+-----------+----------+-------------------+  FV Prox  Full           Yes      Yes                                       +---------+---------------+---------+-----------+----------+-------------------+ FV Mid   Full           Yes      Yes                                      +---------+---------------+---------+-----------+----------+-------------------+ FV DistalFull           Yes      Yes                                      +---------+---------------+---------+-----------+----------+-------------------+ PFV      Full                                                             +---------+---------------+---------+-----------+----------+-------------------+ POP      Full           Yes      Yes                                      +---------+---------------+---------+-----------+----------+-------------------+ PTV      Full                                         Not well visualized +---------+---------------+---------+-----------+----------+-------------------+ PERO     Full                                         Not well visualized +---------+---------------+---------+-----------+----------+-------------------+    Summary: RIGHT: - No evidence of common femoral vein obstruction.   LEFT: - There is no evidence of deep vein thrombosis in the lower extremity.  - No cystic structure found in the popliteal fossa. Subcutaneous edema seen in area of calf (lateral).  *See table(s) above for measurements and observations. Electronically signed by Sherald Hess MD on 11/09/2022 at 4:02:22 PM.    Final     VAS [place] LOWER EXTREMITY VENOUS (DVT)  Result Date: 11/09/2022  Lower Venous DVT Study Patient Name:  AUBRINA NIEMAN  Date of Exam:   11/09/2022 Medical Rec #: [id number]       Accession #:    [id number] Date of Birth: 1945-07-31      Patient Gender: F Patient Age:   29 years Exam Location:  [facility] Procedure:      VAS [place] LOWER EXTREMITY VENOUS (DVT) Referring Phys: STEPHANIE HUDNELL --------------------------------------------------------------------------------  Indications:  Swelling.  Comparison Study: Previous exam on 11/11/2014 was negative for DVT Performing Technologist: Ernestene Mention RVT, RDMS  Examination Guidelines: A complete evaluation includes B-mode imaging, spectral Doppler, color  FV Prox  Full           Yes      Yes                                       +---------+---------------+---------+-----------+----------+-------------------+ FV Mid   Full           Yes      Yes                                      +---------+---------------+---------+-----------+----------+-------------------+ FV DistalFull           Yes      Yes                                      +---------+---------------+---------+-----------+----------+-------------------+ PFV      Full                                                             +---------+---------------+---------+-----------+----------+-------------------+ POP      Full           Yes      Yes                                      +---------+---------------+---------+-----------+----------+-------------------+ PTV      Full                                         Not well visualized +---------+---------------+---------+-----------+----------+-------------------+ PERO     Full                                         Not well visualized +---------+---------------+---------+-----------+----------+-------------------+    Summary: RIGHT: - No evidence of common femoral vein obstruction.   LEFT: - There is no evidence of deep vein thrombosis in the lower extremity.  - No cystic structure found in the popliteal fossa. Subcutaneous edema seen in area of calf (lateral).  *See table(s) above for measurements and observations. Electronically signed by Sherald Hess MD on 11/09/2022 at 4:02:22 PM.    Final     VAS [place] LOWER EXTREMITY VENOUS (DVT)  Result Date: 11/09/2022  Lower Venous DVT Study Patient Name:  AUBRINA NIEMAN  Date of Exam:   11/09/2022 Medical Rec #: [id number]       Accession #:    [id number] Date of Birth: 1945-07-31      Patient Gender: F Patient Age:   29 years Exam Location:  [facility] Procedure:      VAS [place] LOWER EXTREMITY VENOUS (DVT) Referring Phys: STEPHANIE HUDNELL --------------------------------------------------------------------------------  Indications:  Swelling.  Comparison Study: Previous exam on 11/11/2014 was negative for DVT Performing Technologist: Ernestene Mention RVT, RDMS  Examination Guidelines: A complete evaluation includes B-mode imaging, spectral Doppler, color  Basophils Relative 01/11/2022 0.8    Neutro Abs 01/11/2022 2.1    Lymphs Abs 01/11/2022 2.3    Monocytes Absolute 01/11/2022 0.5    Eosinophils Absolute 01/11/2022 0.2    Basophils Absolute 01/11/2022 0.0    TSH 01/11/2022 1.94    Color, Urine 01/11/2022 YELLOW    APPearance 01/11/2022 CLEAR    Specific Gravity, Urine 01/11/2022 1.015    pH 01/11/2022 7.0    Total Protein, Urine 01/11/2022 NEGATIVE    Urine Glucose 01/11/2022 NEGATIVE    Ketones, ur 01/11/2022 NEGATIVE    Bilirubin Urine 01/11/2022 NEGATIVE    Hgb urine dipstick 01/11/2022 NEGATIVE    Urobilinogen, UA 01/11/2022 0.2    Leukocytes,Ua 01/11/2022 TRACE (A)    Nitrite 01/11/2022 NEGATIVE    WBC, UA 01/11/2022 0-2/hpf    RBC / HPF 01/11/2022 none seen    Mucus, UA 01/11/2022 Presence of (A)    Squamous Epithelial / HPF 01/11/2022 Few(5-10/hpf) (A)    Sodium 01/11/2022 143    Potassium 01/11/2022 3.7    Chloride 01/11/2022 108    CO2 01/11/2022 29    Glucose, Bld 01/11/2022 87    BUN 01/11/2022 20    Creatinine, Ser 01/11/2022 0.88    GFR 01/11/2022 63.55    Calcium 01/11/2022 9.1    VITD 01/11/2022 33.64    Vitamin B-12 01/11/2022 1,421 (H)    No image results found.   VAS [place] LOWER EXTREMITY VENOUS (DVT)  Result Date: 11/09/2022  Lower Venous DVT Study Patient Name:  Brandi Shaffer  Date of Exam:   11/09/2022 Medical Rec #:  [id number]       Accession #:    [id number] Date of Birth: November 03, 1945      Patient Gender: F Patient Age:   77 years Exam Location:  [facility] Procedure:      VAS [place] LOWER EXTREMITY VENOUS (DVT) Referring Phys: STEPHANIE HUDNELL --------------------------------------------------------------------------------  Indications: Swelling.  Comparison Study: Previous exam on 11/11/2014 was negative for DVT Performing Technologist: Ernestene Mention RVT, RDMS  Examination Guidelines: A complete evaluation includes B-mode imaging, spectral Doppler, color Doppler, and power Doppler as needed of all accessible portions of each vessel. Bilateral testing is considered an integral part of a complete examination. Limited examinations for reoccurring indications may be performed as noted. The reflux portion of the exam is performed with the patient in reverse Trendelenburg.  +-----+---------------+---------+-----------+----------+--------------+ RIGHTCompressibilityPhasicitySpontaneityPropertiesThrombus Aging +-----+---------------+---------+-----------+----------+--------------+ CFV  Full           Yes      Yes                                 +-----+---------------+---------+-----------+----------+--------------+   +---------+---------------+---------+-----------+----------+-------------------+ LEFT     CompressibilityPhasicitySpontaneityPropertiesThrombus Aging      +---------+---------------+---------+-----------+----------+-------------------+ CFV      Full           Yes      Yes                                      +---------+---------------+---------+-----------+----------+-------------------+ SFJ      Full                                                             +---------+---------------+---------+-----------+----------+-------------------+  FV Prox  Full           Yes      Yes                                       +---------+---------------+---------+-----------+----------+-------------------+ FV Mid   Full           Yes      Yes                                      +---------+---------------+---------+-----------+----------+-------------------+ FV DistalFull           Yes      Yes                                      +---------+---------------+---------+-----------+----------+-------------------+ PFV      Full                                                             +---------+---------------+---------+-----------+----------+-------------------+ POP      Full           Yes      Yes                                      +---------+---------------+---------+-----------+----------+-------------------+ PTV      Full                                         Not well visualized +---------+---------------+---------+-----------+----------+-------------------+ PERO     Full                                         Not well visualized +---------+---------------+---------+-----------+----------+-------------------+    Summary: RIGHT: - No evidence of common femoral vein obstruction.   LEFT: - There is no evidence of deep vein thrombosis in the lower extremity.  - No cystic structure found in the popliteal fossa. Subcutaneous edema seen in area of calf (lateral).  *See table(s) above for measurements and observations. Electronically signed by Sherald Hess MD on 11/09/2022 at 4:02:22 PM.    Final     VAS [place] LOWER EXTREMITY VENOUS (DVT)  Result Date: 11/09/2022  Lower Venous DVT Study Patient Name:  AUBRINA NIEMAN  Date of Exam:   11/09/2022 Medical Rec #: [id number]       Accession #:    [id number] Date of Birth: 1945-07-31      Patient Gender: F Patient Age:   29 years Exam Location:  [facility] Procedure:      VAS [place] LOWER EXTREMITY VENOUS (DVT) Referring Phys: STEPHANIE HUDNELL --------------------------------------------------------------------------------  Indications:  Swelling.  Comparison Study: Previous exam on 11/11/2014 was negative for DVT Performing Technologist: Ernestene Mention RVT, RDMS  Examination Guidelines: A complete evaluation includes B-mode imaging, spectral Doppler, color

## 2022-12-11 ENCOUNTER — Encounter (HOSPITAL_BASED_OUTPATIENT_CLINIC_OR_DEPARTMENT_OTHER): Payer: Self-pay | Admitting: Emergency Medicine

## 2022-12-11 ENCOUNTER — Emergency Department (HOSPITAL_BASED_OUTPATIENT_CLINIC_OR_DEPARTMENT_OTHER): Payer: 59

## 2022-12-11 ENCOUNTER — Telehealth: Payer: Self-pay | Admitting: Internal Medicine

## 2022-12-11 ENCOUNTER — Emergency Department (HOSPITAL_BASED_OUTPATIENT_CLINIC_OR_DEPARTMENT_OTHER)
Admission: EM | Admit: 2022-12-11 | Discharge: 2022-12-12 | Disposition: A | Discharge status: Home / Self Care | Payer: 59 | Attending: Emergency Medicine | Admitting: Emergency Medicine

## 2022-12-11 DIAGNOSIS — E039 Hypothyroidism, unspecified: Secondary | ICD-10-CM | POA: Diagnosis not present

## 2022-12-11 DIAGNOSIS — E119 Type 2 diabetes mellitus without complications: Secondary | ICD-10-CM | POA: Insufficient documentation

## 2022-12-11 DIAGNOSIS — Z79899 Other long term (current) drug therapy: Secondary | ICD-10-CM | POA: Diagnosis not present

## 2022-12-11 DIAGNOSIS — M7989 Other specified soft tissue disorders: Secondary | ICD-10-CM | POA: Diagnosis not present

## 2022-12-11 DIAGNOSIS — M79605 Pain in left leg: Secondary | ICD-10-CM | POA: Insufficient documentation

## 2022-12-11 DIAGNOSIS — J45909 Unspecified asthma, uncomplicated: Secondary | ICD-10-CM | POA: Diagnosis not present

## 2022-12-11 DIAGNOSIS — I10 Essential (primary) hypertension: Secondary | ICD-10-CM | POA: Diagnosis not present

## 2022-12-11 DIAGNOSIS — Z7982 Long term (current) use of aspirin: Secondary | ICD-10-CM | POA: Diagnosis not present

## 2022-12-11 DIAGNOSIS — M19072 Primary osteoarthritis, left ankle and foot: Secondary | ICD-10-CM | POA: Diagnosis not present

## 2022-12-11 LAB — BASIC METABOLIC PANEL
Anion gap: 8 (ref 5–15)
BUN: 16 mg/dL (ref 8–23)
CO2: 26 mmol/L (ref 22–32)
Calcium: 9.3 mg/dL (ref 8.9–10.3)
Chloride: 106 mmol/L (ref 98–111)
Creatinine, Ser: 1.03 mg/dL — ABNORMAL HIGH (ref 0.44–1.00)
GFR, Estimated: 56 mL/min — ABNORMAL LOW (ref >60)
Glucose, Bld: 90 mg/dL (ref 70–99)
Potassium: 4 mmol/L (ref 3.5–5.1)
Sodium: 140 mmol/L (ref 135–145)

## 2022-12-11 LAB — CBC WITH DIFFERENTIAL/PLATELET
Abs Immature Granulocytes: 0.02 10*3/uL (ref 0.00–0.07)
Basophils Absolute: 0 10*3/uL (ref 0.0–0.1)
Basophils Relative: 1 %
Eosinophils Absolute: 0.2 10*3/uL (ref 0.0–0.5)
Eosinophils Relative: 3 %
HCT: 39.5 % (ref 36.0–46.0)
Hemoglobin: 13 g/dL (ref 12.0–15.0)
Immature Granulocytes: 0 %
Lymphocytes Relative: 44 %
Lymphs Abs: 3 10*3/uL (ref 0.7–4.0)
MCH: 30.4 pg (ref 26.0–34.0)
MCHC: 32.9 g/dL (ref 30.0–36.0)
MCV: 92.5 fL (ref 80.0–100.0)
Monocytes Absolute: 0.6 10*3/uL (ref 0.1–1.0)
Monocytes Relative: 9 %
Neutro Abs: 2.8 10*3/uL (ref 1.7–7.7)
Neutrophils Relative %: 43 %
Platelets: 287 10*3/uL (ref 150–400)
RBC: 4.27 MIL/uL (ref 3.87–5.11)
RDW: 14.4 % (ref 11.5–15.5)
WBC: 6.6 10*3/uL (ref 4.0–10.5)
nRBC: 0 % (ref 0.0–0.2)

## 2022-12-11 LAB — URINALYSIS, W/ REFLEX TO CULTURE (INFECTION SUSPECTED)
Bacteria, UA: NONE SEEN
Bilirubin Urine: NEGATIVE
Glucose, UA: NEGATIVE mg/dL
Hgb urine dipstick: NEGATIVE
Ketones, ur: NEGATIVE mg/dL
Nitrite: NEGATIVE
Protein, ur: NEGATIVE mg/dL
Specific Gravity, Urine: 1.039 — ABNORMAL HIGH (ref 1.005–1.030)
pH: 6 (ref 5.0–8.0)

## 2022-12-11 LAB — CBG MONITORING, ED: Glucose-Capillary: 85 mg/dL (ref 70–99)

## 2022-12-11 LAB — SEDIMENTATION RATE: Sed Rate: 8 mm/h (ref 0–22)

## 2022-12-11 MED ORDER — IOHEXOL 300 MG/ML  SOLN
100.0000 mL | Freq: Once | INTRAMUSCULAR | Status: AC | PRN
  Administered 2022-12-11: 100 mL via INTRAVENOUS

## 2022-12-11 NOTE — ED Provider Notes (Signed)
[place] EMERGENCY DEPARTMENT AT [facility] Provider Note   CSN: [id number] Arrival date & time: 12/11/22  1844     History  Chief Complaint  Patient presents with   Leg Pain    Brandi Shaffer is a 77 y.o. female.   Leg Pain Patient sent in for positive blood culture.  Has had pain and some swelling in her left lower leg for around 3 months now.  Has been at physical therapy for it.  Has had worsening swelling for couple months now.  Has been seen by PCP.  Had been on antibiotics.  Saw a new provider and blood cultures and other blood work done and reportedly had positive blood culture. Review of blood work from yesterday.  Does show gram-positive cocci without any growth yet. Of note patient also has normal white count no fevers no chills does have hardware in her left ankle.  Negative sed rate and CRP.  Does have swelling in the leg but states that is really been unchanged recently. Reviewed note and worried for infection or potentially necrotizing fasciitis. Has had recent negative Doppler for DVT.   Past Medical History:  Diagnosis Date   ANEMIA-IRON DEFICIENCY 12/06/2009   Anxiety state 07/01/2015   Asthma 12/05/2011   DIABETES MELLITUS, TYPE II 09/17/2006   DIVERTICULOSIS, COLON 12/06/2009   GENITAL HERPES 03/24/2010   GERD (gastroesophageal reflux disease) 06/28/2011   HYPERLIPIDEMIA 03/24/2010   HYPERTENSION 09/17/2006   HYPOTHYROIDISM 09/17/2006   LIPOMA 12/06/2009   PERIPHERAL EDEMA 03/24/2010   Renal cyst 07/05/2011   1.9 cm minimally complex    Home Medications Prior to Admission medications   Medication Sig Start Date End Date Taking? Authorizing Provider  albuterol (VENTOLIN HFA) 108 (90 Base) MCG/ACT inhaler INHALE 2 PUFFS BY MOUTH EVERY 6 HOURS AS NEEDED FOR WHEEZE 09/24/22   Corwin Levins, MD  amLODipine (NORVASC) 10 MG tablet TAKE 1 TABLET BY MOUTH EVERY DAY 08/28/21   Corwin Levins, MD  ammonium lactate (LAC-HYDRIN) 12 % lotion APPLY 1 APPLICATION  TOPICALLY AS NEEDED FOR DRY SKIN. 06/21/21   [name], DPM  APPLE CIDER VINEGAR PO Take by mouth.    [provider]  aspirin 81 MG EC tablet TAKE 1 TABLET BY MOUTH DAILY. SWALLOW WHOLE. 03/05/14   Corwin Levins, MD  chlorhexidine (PERIDEX) 0.12 % solution Use as directed 15 mLs in the mouth or throat 2 (two) times daily. 04/05/22   Lula Olszewski, MD  Cholecalciferol 50 MCG (2000 UT) TABS 1 tab by mouth once daily 01/11/21   Corwin Levins, MD  clotrimazole (LOTRIMIN) 1 % cream Apply to affected feet and between toes twice daily for 6 weeks for athlete's feet 11/30/20   Galaway, Lise Auer, DPM  cyclobenzaprine (FLEXERIL) 5 MG tablet Take 1 tablet (5 mg total) by mouth 3 (three) times daily as needed for muscle spasms. Patient not taking: Reported on 11/09/2022 04/05/22   Lula Olszewski, MD  fluconazole (DIFLUCAN) 150 MG tablet Take 1 tablet (150 mg total) by mouth every three (3) days as needed. Patient not taking: Reported on 11/09/2022 04/05/22   Lula Olszewski, MD  furosemide (LASIX) 20 MG tablet Take 1 tablet (20 mg total) by mouth daily as needed. 03/26/22   Corwin Levins, MD  gabapentin (NEURONTIN) 100 MG capsule Take 1 capsule (100 mg total) by mouth 3 (three) times daily. 12/10/22   Lula Olszewski, MD  losartan (COZAAR) 25 MG tablet Take 1  tablet (25 mg total) by mouth daily. 06/25/22   Corwin Levins, MD  Misc Natural Products (CRANBERRY/PROBIOTIC PO) Take by mouth.    [provider]  Multiple Vitamins-Minerals (WOMENS MULTIVITAMIN PO) Take 1 tablet by mouth daily.    [provider]  OneTouch Delica Lancets 33G MISC 1 each by Does not apply route 2 (two) times daily. Use to check blood sugars twice a day Dx E11.9 01/11/22   Corwin Levins, MD  [facility] VERIO test strip USE 2 (TWO) TIMES DAILY. DX E11.9 10/01/22   Corwin Levins, MD  pioglitazone (ACTOS) 15 MG tablet TAKE 1 TABLET BY MOUTH EVERY DAY 10/26/22   Corwin Levins, MD  potassium chloride (KLOR-CON) 10  MEQ tablet TAKE 1 TABLET BY MOUTH EVERY DAY 04/06/22   Corwin Levins, MD  predniSONE (DELTASONE) 20 MG tablet Take 2 pills for 3 days, 1 pill for 4 days 12/10/22   Lula Olszewski, MD  rosuvastatin (CRESTOR) 20 MG tablet Take 1 tablet (20 mg total) by mouth daily. Patient not taking: Reported on 12/10/2022 01/11/22   Corwin Levins, MD  sulfamethoxazole-trimethoprim (BACTRIM DS) 800-160 MG tablet Take 1 tablet by mouth 2 (two) times daily. 12/10/22   Lula Olszewski, MD  vitamin B-12 (CYANOCOBALAMIN) 1000 MCG tablet 1 tab by mouth mon - wed - thur 01/11/21   Corwin Levins, MD      Allergies    Januvia [sitagliptin phosphate], Lipitor [atorvastatin], Metformin and related, and Doxycycline    Review of Systems   Review of Systems  Physical Exam Updated Vital Signs BP (!) 168/64   Pulse 90   Temp 98.3 F (36.8 C)   Resp 18   SpO2 100%  Physical Exam Vitals and nursing note reviewed.  Musculoskeletal:        General: Tenderness present.     Comments: Tenderness to left lower extremity.  Does have some induration.  No erythema and not warm.  Does have scars both medial and laterally on ankle from previous surgery.  Not irritable at ankle.  Does have some pain with palpation in the area of the calf.  Neurological:     Mental Status: She is alert.     ED Results / Procedures / Treatments   Labs (all labs ordered are listed, but only abnormal results are displayed) Labs Reviewed  CULTURE, BLOOD (ROUTINE X 2)  CULTURE, BLOOD (ROUTINE X 2)  CBC WITH DIFFERENTIAL/PLATELET  BASIC METABOLIC PANEL  C-REACTIVE PROTEIN  SEDIMENTATION RATE    EKG None  Radiology No results found.  Procedures Procedures    Medications Ordered in ED Medications - No data to display  ED Course/ Medical Decision Making/ A&P                                 Medical Decision Making Amount and/or Complexity of Data Reviewed Labs: ordered. Radiology: ordered.  Risk Prescription drug  management.   Patient sent in for positive blood culture and swelling of left lower leg.  However leg has been swollen for months now.  Not more tender.  No fevers.  No chills.  Patient is diabetic however.  White count sed rate and CRP normal yesterday.  White count still normal today.  Discussed with Dr. Manson Passey from infectious disease.  We think it is reasonable for oral treatment if CT scan is reassuring.  If shows fluid collection or likely  infection potentially would need admission to hospital.  Does not appear to be septic at this time.  CT scan pending.  Care turned over to [facility]        Final Clinical Impression(s) / ED Diagnoses Final diagnoses:  None    Rx / DC Orders ED Discharge Orders     None         Benjiman Core, MD 12/11/22 2239

## 2022-12-11 NOTE — ED Notes (Signed)
Patient concerned about her BG, checked and informed patient and MD of result. MD states we should continue to wait for CT results before giving patient anything PO, patient informed, agrees with plan.

## 2022-12-11 NOTE — ED Provider Notes (Signed)
  Provider Note MRN:  [id number]  Arrival date & time: 12/12/22    ED Course and Medical Decision Making  Assumed care from Dr. Rubin Payor at shift change.  Acute on chronic lower extremity swelling, positive single blood culture recently, question contaminant.  Patient is not systemically ill.  Labs and inflammatory markers are normal.  Awaiting CT to ensure no evidence of necrotizing infection.  Candidate for discharge.  12 AM update: The CT scan is overall reassuring, nothing to suggest infection or abscess.  There is edema.  On my exam the leg is swollen and a bit more warm to touch and her pain is increased but it sounds like this is all since August after a day of strenuous activity with physical therapy.  Another consideration would be complex regional pain syndrome.  Overall suspect that the positive blood culture is a contaminant.  Still at the request of infectious disease, will have patient take outpatient antibiotics with strict return precautions.  She has a listed allergy to doxycycline, she is already taking Bactrim, will add on Keflex for broader coverage.  Procedures  Final Clinical Impressions(s) / ED Diagnoses     ICD-10-CM   1. Pain of left lower extremity  M79.605       ED Discharge Orders          Ordered    cephALEXin (KEFLEX) 500 MG capsule  3 times daily        12/12/22 0000              Discharge Instructions      You were evaluated in the Emergency Department and after careful evaluation, we did not find any emergent condition requiring admission or further testing in the hospital.  Your exam/testing today was overall reassuring.  We recommend that you continue taking the TMP-SMX medication as prescribed.  We also want you to take the newly prescribed Keflex antibiotic.  Continue Tylenol or Motrin at home for discomfort, elevate the leg to help with the swelling.  Recommend follow-up with an orthopedic specialist and continue to follow-up with your regular  doctors.  Please return to the Emergency Department if you experience any worsening of your condition.  Thank you for allowing [place] to be a part of your care.       Elmer Sow. Pilar Plate, MD [facility] Emergency Medicine [facility] [email or web address]    Sabas Sous, MD 12/12/22 Marlyne Beards

## 2022-12-11 NOTE — Telephone Encounter (Signed)
FYI. Received a call from on call answering service regarding a critical lab value.  Blood culture - gram positive cocci.  Spoke to pt and reviewed chart.  Dr Jon Billings has already notified Ms Elms and she is currently over at the hospital (in the ER) waiting to be evaluated.

## 2022-12-11 NOTE — Discharge Instructions (Addendum)
You were evaluated in the Emergency Department and after careful evaluation, we did not find any emergent condition requiring admission or further testing in the hospital.  Your exam/testing today was overall reassuring.  We recommend that you continue taking the TMP-SMX medication as prescribed.  We also want you to take the newly prescribed Keflex antibiotic.  Continue Tylenol or Motrin at home for discomfort, elevate the leg to help with the swelling.  Recommend follow-up with an orthopedic specialist and continue to follow-up with your regular doctors.  Please return to the Emergency Department if you experience any worsening of your condition.  Thank you for allowing [place] to be a part of your care.

## 2022-12-11 NOTE — ED Triage Notes (Signed)
Very tender left lower leg, calf. X a couple weeks  Was seen by PCP, treatment plan started.  Today +blood cultures- gram + cocci

## 2022-12-12 ENCOUNTER — Telehealth: Payer: Self-pay

## 2022-12-12 ENCOUNTER — Encounter: Payer: Self-pay | Admitting: Internal Medicine

## 2022-12-12 ENCOUNTER — Inpatient Hospital Stay
Admission: RE | Admit: 2022-12-12 | Discharge: 2022-12-12 | Discharge status: Home / Self Care | Payer: 59 | Source: Ambulatory Visit | Attending: Internal Medicine

## 2022-12-12 ENCOUNTER — Other Ambulatory Visit: Payer: Self-pay | Admitting: Internal Medicine

## 2022-12-12 DIAGNOSIS — M7989 Other specified soft tissue disorders: Secondary | ICD-10-CM

## 2022-12-12 DIAGNOSIS — L03116 Cellulitis of left lower limb: Secondary | ICD-10-CM

## 2022-12-12 DIAGNOSIS — M79662 Pain in left lower leg: Secondary | ICD-10-CM | POA: Diagnosis not present

## 2022-12-12 DIAGNOSIS — K760 Fatty (change of) liver, not elsewhere classified: Secondary | ICD-10-CM | POA: Insufficient documentation

## 2022-12-12 DIAGNOSIS — N182 Chronic kidney disease, stage 2 (mild): Secondary | ICD-10-CM | POA: Insufficient documentation

## 2022-12-12 DIAGNOSIS — I739 Peripheral vascular disease, unspecified: Secondary | ICD-10-CM | POA: Diagnosis not present

## 2022-12-12 LAB — C-REACTIVE PROTEIN: CRP: 0.7 mg/dL (ref <1.0)

## 2022-12-12 MED ORDER — IOPAMIDOL (ISOVUE-370) INJECTION 76%
100.0000 mL | Freq: Once | INTRAVENOUS | Status: AC | PRN
  Administered 2022-12-12: 100 mL via INTRAVENOUS

## 2022-12-12 MED ORDER — CEPHALEXIN 500 MG PO CAPS: 500.0000 mg | ORAL_CAPSULE | Freq: Three times a day (TID) | ORAL | 0 refills | Status: AC

## 2022-12-12 NOTE — Progress Notes (Signed)
Please convert the orthopedic referral to urgent and include this xray showing a fracture that was not previously seen. She is still walking on this ankle.  We also worry about possible infection of hardware due to high concern for adjoining soft tissue infection

## 2022-12-12 NOTE — Telephone Encounter (Signed)
Results were taken by Dale Silo, MD. Pt was informed & went to [facility].  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 Translation No Nurse Assessment Nurse: Geryl Rankins, RN, Nofa Date/Time (Eastern Time): 12/11/2022 6:29:06 PM Is there an on-call provider listed? ---Yes Please list name of person reporting value (Lab Employee) and a contact number. ---Bonita Quin [phone] Please document the following items: Lab name Lab value (read back to lab to verify) Reference range for lab value Date and time blood was drawn Collect time of birth for bilirubin results ---12/10/22 1229pm Blood culture: abnormal (gram positive cocci) no growth today partial results; anerobic and aerobic received Please collect the patient contact information from the lab. (name, phone number and address) ---Jennye Boroughs [phone] Disp. Time Lamount Cohen Time) Disposition Final User 12/11/2022 6:36:40 PM Called On-Call Provider Kadour, RN, Nofa 12/11/2022 6:37:16 PM Clinical Call Yes Geryl Rankins, RN, Nofa Final Disposition 12/11/2022 6:37:16 PM Clinical Call Yes Geryl Rankins, RN, Nofa Paging DoctorName Phone DateTime Result/ Outcome Message Type Notes Dale [place] - MD [phone] 12/11/2022 6:36:40 PM Called On Call Provider - Reached Doctor Paged Dale [employer] - MD 12/11/2022 6:37:08 PM Spoke with On Call -  General Message Result Critical results shared. States she will call pt.

## 2022-12-12 NOTE — ED Notes (Signed)
Reviewed AVS with patient, patient expressed understanding of directions, denies further questions at this time. 

## 2022-12-12 NOTE — Telephone Encounter (Signed)
FYI

## 2022-12-12 NOTE — Telephone Encounter (Signed)
Dr.Morrison made aware verbally.

## 2022-12-12 NOTE — Progress Notes (Signed)
Bactrim is being taken with keflex form emergency room for this. See message from CT with runoff

## 2022-12-12 NOTE — Progress Notes (Signed)
Reviewed CT angiography (12/12/2022) and recent labs. Blood culture positive for Staphylococcus hominis. CT shows leg swelling and calcifications, likely from chronic venous issues. No significant vascular problems found. ER Adding Keflex to Bactrim for infection. Continue compression therapy. Follow up with PCP next week. Orthopedic referral placed for ankle evaluation. Monitor for fever or worsening symptoms. See detailed explanation in Patient Message. Very complex presentation complex illness.

## 2022-12-13 ENCOUNTER — Encounter: Payer: Self-pay | Admitting: Internal Medicine

## 2022-12-13 LAB — HM DIABETES EYE EXAM

## 2022-12-16 LAB — PROTEIN ELECTROPHORESIS, SERUM
Albumin ELP: 4.2 g/dL (ref 3.8–4.8)
Alpha 1: 0.3 g/dL (ref 0.2–0.3)
Alpha 2: 0.6 g/dL (ref 0.5–0.9)
Beta 2: 0.4 g/dL (ref 0.2–0.5)
Beta Globulin: 0.4 g/dL (ref 0.4–0.6)
Gamma Globulin: 1 g/dL (ref 0.8–1.7)
Total Protein: 7 g/dL (ref 6.1–8.1)

## 2022-12-16 LAB — CULTURE, BLOOD (SINGLE)
MICRO NUMBER:: 15590702
SPECIMEN QUALITY:: ADEQUATE

## 2022-12-16 LAB — CULTURE, BLOOD (ROUTINE X 2)
Culture: NO GROWTH
Culture: NO GROWTH
Special Requests: ADEQUATE
Special Requests: ADEQUATE

## 2022-12-16 LAB — RHEUMATOID FACTOR: Rheumatoid fact SerPl-aCnc: 20 [IU]/mL — ABNORMAL HIGH (ref <14)

## 2022-12-16 LAB — CYCLIC CITRUL PEPTIDE ANTIBODY, IGG: Cyclic Citrullin Peptide Ab: <16 U

## 2022-12-16 NOTE — Progress Notes (Signed)
Refill bactrim x 1 week and confirm patient is aware to extend her course.   She also should follow up on how her leg pain is doing soon with Primary Care Provider (PCP) Corwin Levins, MD unless she is not able to get in with him- I will see in follow up as needed.

## 2022-12-17 ENCOUNTER — Other Ambulatory Visit: Payer: Self-pay

## 2022-12-17 DIAGNOSIS — M79662 Pain in left lower leg: Secondary | ICD-10-CM

## 2022-12-17 DIAGNOSIS — L03116 Cellulitis of left lower limb: Secondary | ICD-10-CM

## 2022-12-17 DIAGNOSIS — M7989 Other specified soft tissue disorders: Secondary | ICD-10-CM

## 2022-12-17 MED ORDER — SULFAMETHOXAZOLE-TRIMETHOPRIM 800-160 MG PO TABS: 1.0000 | ORAL_TABLET | Freq: Two times a day (BID) | ORAL | 0 refills | Status: DC

## 2022-12-20 ENCOUNTER — Ambulatory Visit (INDEPENDENT_AMBULATORY_CARE_PROVIDER_SITE_OTHER): Payer: 59 | Admitting: Physician Assistant

## 2022-12-20 ENCOUNTER — Ambulatory Visit (HOSPITAL_COMMUNITY)
Admission: RE | Admit: 2022-12-20 | Discharge: 2022-12-20 | Disposition: A | Discharge status: Home / Self Care | Payer: 59 | Source: Ambulatory Visit | Attending: Vascular Surgery | Admitting: Vascular Surgery

## 2022-12-20 ENCOUNTER — Ambulatory Visit: Payer: 59 | Admitting: Orthopedic Surgery

## 2022-12-20 ENCOUNTER — Other Ambulatory Visit: Payer: Self-pay

## 2022-12-20 ENCOUNTER — Encounter: Payer: Self-pay | Admitting: Orthopedic Surgery

## 2022-12-20 VITALS — BP 154/81 | HR 73 | Temp 97.7°F | Ht 59.0 in | Wt 190.9 lb

## 2022-12-20 DIAGNOSIS — M19172 Post-traumatic osteoarthritis, left ankle and foot: Secondary | ICD-10-CM | POA: Diagnosis not present

## 2022-12-20 DIAGNOSIS — M79604 Pain in right leg: Secondary | ICD-10-CM

## 2022-12-20 DIAGNOSIS — M609 Myositis, unspecified: Secondary | ICD-10-CM

## 2022-12-20 DIAGNOSIS — M79662 Pain in left lower leg: Secondary | ICD-10-CM | POA: Diagnosis not present

## 2022-12-20 DIAGNOSIS — I872 Venous insufficiency (chronic) (peripheral): Secondary | ICD-10-CM

## 2022-12-20 DIAGNOSIS — M79605 Pain in left leg: Secondary | ICD-10-CM | POA: Insufficient documentation

## 2022-12-20 NOTE — Progress Notes (Signed)
Office Visit Note   Patient: Brandi Shaffer           Date of Birth: 1945-09-15           MRN: [id number] Visit Date: 12/20/2022              Requested by: Lula Olszewski, MD [address] [place],  [place] [address] PCP: Corwin Levins, MD  Chief Complaint  Patient presents with   Left Ankle - Pain      HPI: Patient is a 77 year old woman who is seen for initial evaluation for left ankle and left calf pain for the past month.  Patient states she went to the [facility] emergency department on October 15 radiographs and CT scan was obtained of the left leg and left ankle.  Patient was started on Bactrim and Keflex as well as 20 mg of prednisone and Neurontin.  Assessment & Plan: Visit Diagnoses:  1. Pain of left calf   2. Venous stasis dermatitis of left lower extremity   3. Post-traumatic arthritis of ankle, left     Plan: Discussed patient does have calcification of the soft tissue in her left calf.  Recommended graduated compression stockings at [facility].  Recommended Voltaren gel for the traumatic arthritis.  She does have a follow-up appointment with vascular surgery as well.  I will follow-up in 4 weeks.  Recommend that she stop the antibiotics prednisone and Neurontin.  Follow-Up Instructions: No follow-ups on file.   Ortho Exam  Patient is alert, oriented, no adenopathy, well-dressed, normal affect, normal respiratory effort. Examination patient has a palpable dorsalis pedis pulse.  She is status post open reduction internal fixation for bimalleolar ankle fracture about 7 years ago.  Radiograph shows a healed bimalleolar fracture and does show traumatic arthritis with joint space narrowing and periarticular cystic changes.  Patient also has venous insufficiency with pitting edema in the left calf and brawny skin color changes.  There is calcifications radiographically in the soft tissue.  Patient has no temperature changes in her leg no pain to light  touch no clinical signs of complex regional pain syndrome.  No pain with passive dorsiflexion of the ankle or palpation in the calf no clinical signs of a DVT.  Imaging: No results found. No images are attached to the encounter.  Labs: Lab Results  Component Value Date   HGBA1C 5.9 09/24/2022   HGBA1C 6.0 01/11/2022   HGBA1C 5.6 07/11/2021   ESRSEDRATE 8 12/11/2022   ESRSEDRATE 30 12/10/2022   ESRSEDRATE 35 (H) 12/13/2014   CRP 0.7 12/11/2022   CRP <1.0 12/10/2022   CRP 0.8 12/13/2014   LABURIC 5.6 12/10/2022   REPTSTATUS 12/16/2022 FINAL 12/11/2022   CULT  12/11/2022    NO GROWTH 5 DAYS Performed at [facility], [address], [place], [place] [address]    LABORGA PROTEUS MIRABILIS 12/12/2006     Lab Results  Component Value Date   ALBUMIN 4.3 12/10/2022   ALBUMIN 4.1 09/24/2022   ALBUMIN 4.3 04/03/2022    No results found for: "MG" Lab Results  Component Value Date   VD25OH 21.55 (L) 09/24/2022   VD25OH 33.64 01/11/2022   VD25OH 23.26 (L) 07/11/2021    No results found for: "PREALBUMIN"    Latest Ref Rng & Units 12/11/2022    8:04 PM 12/10/2022   12:29 PM 09/24/2022   10:21 AM  CBC EXTENDED  WBC 4.0 - 10.5 K/uL 6.6  5.5  4.8  RBC 3.87 - 5.11 MIL/uL 4.27  4.35  4.23   Hemoglobin 12.0 - 15.0 g/dL 16.1  09.6  04.5   HCT 36.0 - 46.0 % 39.5  40.3  39.8   Platelets 150 - 400 K/uL 287  278.0  237.0   NEUT# 1.7 - 7.7 K/uL 2.8  2.7  2.0   Lymph# 0.7 - 4.0 K/uL 3.0  2.1  2.2      There is no height or weight on file to calculate BMI.  Orders:  No orders of the defined types were placed in this encounter.  No orders of the defined types were placed in this encounter.    Procedures: No procedures performed  Clinical Data: No additional findings.  ROS:  All other systems negative, except as noted in the HPI. Review of Systems  Objective: Vital Signs: There were no vitals taken for this visit.  Specialty Comments:  No specialty comments  available.  PMFS History: Patient Active Problem List   Diagnosis Date Noted   Chronic kidney disease, stage 2 (mild) 12/12/2022   Fatty liver 12/12/2022   Multiple skin nodules 06/28/2022   Dental infection 04/05/2022   Peripheral edema 03/26/2022   Leg length discrepancy 09/28/2020   Greater trochanteric bursitis, right 08/09/2020   Posterior vitreous detachment of right eye 02/18/2020   Posterior vitreous detachment of left eye 02/18/2020   Diabetes mellitus without complication (HCC) 02/18/2020   Nuclear sclerotic cataract of both eyes 02/18/2020   Urinary frequency 01/11/2020   Pelvic pain 01/11/2020   Vitamin D deficiency 01/11/2020   B12 deficiency 01/11/2020   Superficial phlebitis 09/03/2019   Pain of right thumb 01/06/2019   Sprain of second toe, left, initial encounter 12/18/2017   Acute upper respiratory infection 01/04/2017   Greater trochanteric bursitis of left hip 12/19/2015   Right otitis media 12/16/2015   Shoulder dislocation 08/03/2015   Anxiety state 07/01/2015   Cellulitis 04/01/2015   Osteoarthritis of left ankle 04/01/2015   Bilateral leg pain 02/15/2015   PVD (peripheral vascular disease) (HCC) 12/22/2014   Cellulitis of left lower leg 12/13/2014   Right leg pain 11/10/2014   Hx of colonic polyps 09/08/2013   Obesity (BMI 30-39.9) 09/08/2013   Impingement syndrome of left shoulder 06/04/2012   Mild aortic stenosis 12/05/2011   Asthma 12/05/2011   Renal cyst 07/05/2011   Low back pain 06/28/2011   GERD (gastroesophageal reflux disease) 06/28/2011   Carpal tunnel syndrome of right wrist 04/22/2011   Right foot pain 05/23/2010   Encounter for well adult exam with abnormal findings 05/23/2010   Genital herpes 03/24/2010   Hyperlipidemia 03/24/2010   Nocturia 03/24/2010   LIPOMA 12/06/2009   ANEMIA-IRON DEFICIENCY 12/06/2009   CONJUNCTIVITIS, BILATERAL 12/06/2009   DIVERTICULOSIS, COLON 12/06/2009   Hypothyroidism 09/17/2006   Diabetes (HCC)  09/17/2006   Essential hypertension 09/17/2006   Past Medical History:  Diagnosis Date   ANEMIA-IRON DEFICIENCY 12/06/2009   Anxiety state 07/01/2015   Asthma 12/05/2011   DIABETES MELLITUS, TYPE II 09/17/2006   DIVERTICULOSIS, COLON 12/06/2009   GENITAL HERPES 03/24/2010   GERD (gastroesophageal reflux disease) 06/28/2011   HYPERLIPIDEMIA 03/24/2010   HYPERTENSION 09/17/2006   HYPOTHYROIDISM 09/17/2006   LIPOMA 12/06/2009   PERIPHERAL EDEMA 03/24/2010   Renal cyst 07/05/2011   1.9 cm minimally complex    Family History  Problem Relation Age of Onset   Hypertension Mother    Hypertension Father    Heart disease Sister        sudden  death early 56's   Stroke Sister    Breast cancer Sister 40   Prostate cancer Brother    Diabetes Other    Joint hypermobility Other        DJD   Colon cancer Neg Hx    Colon polyps Neg Hx    Esophageal cancer Neg Hx    Stomach cancer Neg Hx    Rectal cancer Neg Hx     Past Surgical History:  Procedure Laterality Date   ABDOMINAL HYSTERECTOMY     fibroids   ankle surgury     x 3 left - chronic pain/swelling   COLONOSCOPY  2011   SHOULDER SURGERY Left    TONSILLECTOMY     TUBAL LIGATION     Social History   Occupational History   Occupation: CASHIER/ retired    Comment: gas station, stands for work  Tobacco Use   Smoking status: Never   Smokeless tobacco: Never  Vaping Use   Vaping status: Never Used  Substance and Sexual Activity   Alcohol use: No    Alcohol/week: 0.0 standard drinks of alcohol   Drug use: No   Sexual activity: Not Currently

## 2022-12-20 NOTE — Progress Notes (Signed)
Requested by:  Lula Olszewski, MD [address] [place],  [place] [address]  Reason for consultation: left leg swelling    History of Present Illness   Brandi Shaffer is a 77 y.o. (Feb 18, 1946) female who presents for evaluation of left leg swelling.  The patient states she has a long history of lower extremity swelling, but it has never been very bothersome.  Over the past month she has developed worsening left lower leg swelling.  Her left leg from the knee to the ankle gets fairly swollen after being on her feet all day.  This causes her leg to feel tight, uncomfortable, and itchy.  Her right leg does not swell much.  She recently went to the ED for evaluation of her symptoms.  There was concern for possible cellulitis and she was placed on antibiotics.  She has recently finished these.  She denies any fever or chills.  She was also recently started on Lasix and has slightly benefited from this.  She has tried compression stockings in the past but they are too difficult to put on.  She just recently started elevating her leg above her heart once or twice a day and is benefiting from this.  She has no prior history of DVT or previous vein procedures.  She has a history of left ankle ORIF.  She does have a history of diabetes.  She denies any claudication, tissue loss, rest pain.   Past Medical History:  Diagnosis Date   ANEMIA-IRON DEFICIENCY 12/06/2009   Anxiety state 07/01/2015   Asthma 12/05/2011   DIABETES MELLITUS, TYPE II 09/17/2006   DIVERTICULOSIS, COLON 12/06/2009   GENITAL HERPES 03/24/2010   GERD (gastroesophageal reflux disease) 06/28/2011   HYPERLIPIDEMIA 03/24/2010   HYPERTENSION 09/17/2006   HYPOTHYROIDISM 09/17/2006   LIPOMA 12/06/2009   PERIPHERAL EDEMA 03/24/2010   Renal cyst 07/05/2011   1.9 cm minimally complex    Past Surgical History:  Procedure Laterality Date   ABDOMINAL HYSTERECTOMY     fibroids   ankle surgury     x 3 left - chronic pain/swelling    COLONOSCOPY  2011   SHOULDER SURGERY Left    TONSILLECTOMY     TUBAL LIGATION      Social History   Socioeconomic History   Marital status: Divorced    Spouse name: Not on file   Number of children: 7   Years of education: Not on file   Highest education level: Not on file  Occupational History   Occupation: CASHIER/ retired    Comment: gas station, stands for work  Tobacco Use   Smoking status: Never   Smokeless tobacco: Never  Vaping Use   Vaping status: Never Used  Substance and Sexual Activity   Alcohol use: No    Alcohol/week: 0.0 standard drinks of alcohol   Drug use: No   Sexual activity: Not Currently  Other Topics Concern   Not on file  Social History Narrative   Lives with grandson and and pets;    Have 6 sons and one dtr   Have grand- children; running children through out the day and cooking   Social Determinants of Health   Financial Resource Strain: Low Risk  (03/05/2022)   Overall Financial Resource Strain (CARDIA)    Difficulty of Paying Living Expenses: Not hard at all  Food Insecurity: No Food Insecurity (03/05/2022)   Hunger Vital Sign    Worried About Running Out of Food in the Last Year: Never  true    Ran Out of Food in the Last Year: Never true  Transportation Needs: No Transportation Needs (03/05/2022)   PRAPARE - Administrator, Civil Service (Medical): No    Lack of Transportation (Non-Medical): No  Physical Activity: Sufficiently Active (03/05/2022)   Exercise Vital Sign    Days of Exercise per Week: 5 days    Minutes of Exercise per Session: 30 min  Stress: No Stress Concern Present (03/05/2022)   [employer] of Occupational Health - Occupational Stress Questionnaire    Feeling of Stress : Not at all  Social Connections: Moderately Integrated (03/05/2022)   Social Connection and Isolation Panel [NHANES]    Frequency of Communication with Friends and Family: More than three times a week    Frequency of Social Gatherings with  Friends and Family: More than three times a week    Attends Religious Services: More than 4 times per year    Active Member of [employer] or Organizations: No    Attends Engineer, structural: More than 4 times per year    Marital Status: Divorced  Catering manager Violence: Not At Risk (03/05/2022)   Humiliation, Afraid, Rape, and Kick questionnaire    Fear of Current or Ex-Partner: No    Emotionally Abused: No    Physically Abused: No    Sexually Abused: No    Family History  Problem Relation Age of Onset   Hypertension Mother    Hypertension Father    Heart disease Sister        sudden death early 71's   Stroke Sister    Breast cancer Sister 44   Prostate cancer Brother    Diabetes Other    Joint hypermobility Other        DJD   Colon cancer Neg Hx    Colon polyps Neg Hx    Esophageal cancer Neg Hx    Stomach cancer Neg Hx    Rectal cancer Neg Hx     Current Outpatient Medications  Medication Sig Dispense Refill   albuterol (VENTOLIN HFA) 108 (90 Base) MCG/ACT inhaler INHALE 2 PUFFS BY MOUTH EVERY 6 HOURS AS NEEDED FOR WHEEZE 8.5 each 3   amLODipine (NORVASC) 10 MG tablet TAKE 1 TABLET BY MOUTH EVERY DAY 90 tablet 3   ammonium lactate (LAC-HYDRIN) 12 % lotion APPLY 1 APPLICATION TOPICALLY AS NEEDED FOR DRY SKIN. 400 mL 5   APPLE CIDER VINEGAR PO Take by mouth.     aspirin 81 MG EC tablet TAKE 1 TABLET BY MOUTH DAILY. SWALLOW WHOLE. 30 tablet 11   chlorhexidine (PERIDEX) 0.12 % solution Use as directed 15 mLs in the mouth or throat 2 (two) times daily. 473 mL 11   Cholecalciferol 50 MCG (2000 UT) TABS 1 tab by mouth once daily 30 tablet 99   clotrimazole (LOTRIMIN) 1 % cream Apply to affected feet and between toes twice daily for 6 weeks for athlete's feet 60 g 1   cyclobenzaprine (FLEXERIL) 5 MG tablet Take 1 tablet (5 mg total) by mouth 3 (three) times daily as needed for muscle spasms. (Patient not taking: Reported on 11/09/2022) 30 tablet 1   fluconazole (DIFLUCAN)  150 MG tablet Take 1 tablet (150 mg total) by mouth every three (3) days as needed. (Patient not taking: Reported on 11/09/2022) 2 tablet 1   furosemide (LASIX) 20 MG tablet Take 1 tablet (20 mg total) by mouth daily as needed. 90 tablet 3   gabapentin (NEURONTIN) 100 MG  capsule Take 1 capsule (100 mg total) by mouth 3 (three) times daily. (Patient not taking: Reported on 12/20/2022) 90 capsule 3   losartan (COZAAR) 25 MG tablet Take 1 tablet (25 mg total) by mouth daily. 90 tablet 3   Misc Natural Products (CRANBERRY/PROBIOTIC PO) Take by mouth.     Multiple Vitamins-Minerals (WOMENS MULTIVITAMIN PO) Take 1 tablet by mouth daily.     OneTouch Delica Lancets 33G MISC 1 each by Does not apply route 2 (two) times daily. Use to check blood sugars twice a day Dx E11.9 200 each 11   ONETOUCH VERIO test strip USE 2 (TWO) TIMES DAILY. DX E11.9 100 strip 4   pioglitazone (ACTOS) 15 MG tablet TAKE 1 TABLET BY MOUTH EVERY DAY 90 tablet 3   potassium chloride (KLOR-CON) 10 MEQ tablet TAKE 1 TABLET BY MOUTH EVERY DAY 90 tablet 3   predniSONE (DELTASONE) 20 MG tablet Take 2 pills for 3 days, 1 pill for 4 days (Patient not taking: Reported on 12/20/2022) 10 tablet 0   rosuvastatin (CRESTOR) 20 MG tablet Take 1 tablet (20 mg total) by mouth daily. (Patient not taking: Reported on 12/10/2022) 90 tablet 3   sulfamethoxazole-trimethoprim (BACTRIM DS) 800-160 MG tablet Take 1 tablet by mouth 2 (two) times daily. 14 tablet 0   vitamin B-12 (CYANOCOBALAMIN) 1000 MCG tablet 1 tab by mouth mon - wed - thur 90 tablet 3   No current facility-administered medications for this visit.    Allergies  Allergen Reactions   Januvia [Sitagliptin Phosphate]     dizziness   Lipitor [Atorvastatin] Other (See Comments)    Myalgia    Metformin And Related Nausea Only   Doxycycline Rash    REVIEW OF SYSTEMS (negative unless checked):   Cardiac:  []  Chest pain or chest pressure? []  Shortness of breath upon activity? []   Shortness of breath when lying flat? []  Irregular heart rhythm?  Vascular:  []  Pain in calf, thigh, or hip brought on by walking? []  Pain in feet at night that wakes you up from your sleep? []  Blood clot in your veins? [x]  Leg swelling?  Pulmonary:  []  Oxygen at home? []  Productive cough? []  Wheezing?  Neurologic:  []  Sudden weakness in arms or legs? []  Sudden numbness in arms or legs? []  Sudden onset of difficult speaking or slurred speech? []  Temporary loss of vision in one eye? []  Problems with dizziness?  Gastrointestinal:  []  Blood in stool? []  Vomited blood?  Genitourinary:  []  Burning when urinating? []  Blood in urine?  Psychiatric:  []  Major depression  Hematologic:  []  Bleeding problems? []  Problems with blood clotting?  Dermatologic:  []  Rashes or ulcers?  Constitutional:  []  Fever or chills?  Ear/Nose/Throat:  []  Change in hearing? []  Nose bleeds? []  Sore throat?  Musculoskeletal:  []  Back pain? []  Joint pain? []  Muscle pain?   Physical Examination     Vitals:   12/20/22 1445  BP: (!) 154/81  Pulse: 73  Temp: 97.7 F (36.5 C)  SpO2: 97%  Weight: 190 lb 14.4 oz (86.6 kg)  Height: 4\' 11"  (1.499 m)   Body mass index is 38.56 kg/m.  General:  WDWN in NAD; vital signs documented above Gait: Not observed HENT: WNL, normocephalic Pulmonary: normal non-labored breathing , without Rales, rhonchi,  wheezing Cardiac: regular Abdomen: soft, NT, no masses Skin: without rashes Vascular Exam/Pulses: 2+ pedal pulse Extremities: without varicose veins, small reticular veins on bilateral thighs, stasis pigmentation of bilateral ankles, 1+ right  ankle edema, 2+ left ankle and calf edema Musculoskeletal: no muscle wasting or atrophy  Neurologic: A&O X 3;  No focal weakness or paresthesias are detected Psychiatric:  The pt has Normal affect.  Non-invasive Vascular Imaging   LLE Venous Insufficiency Duplex (12/20/2022):   +--------------+---------+------+-----------+------------+-------------+  LEFT         Reflux NoRefluxReflux TimeDiameter cmsComments                               Yes                                        +--------------+---------+------+-----------+------------+-------------+  CFV          no                                                   +--------------+---------+------+-----------+------------+-------------+  FV mid        no                                                   +--------------+---------+------+-----------+------------+-------------+  Popliteal    no                                                   +--------------+---------+------+-----------+------------+-------------+  GSV at SFJ              yes    >500 ms      0.98                   +--------------+---------+------+-----------+------------+-------------+  GSV prox thighno                            0.56                   +--------------+---------+------+-----------+------------+-------------+  GSV mid thigh no                            0.45    out of fascia  +--------------+---------+------+-----------+------------+-------------+  GSV dist thighno                            0.25                   +--------------+---------+------+-----------+------------+-------------+  GSV at knee   no                            0.27                   +--------------+---------+------+-----------+------------+-------------+  GSV prox calf no                            0.24                   +--------------+---------+------+-----------+------------+-------------+  GSV mid calf  no                            0.17                   +--------------+---------+------+-----------+------------+-------------+  SSV Pop Fossa no                            0.17                   +--------------+---------+------+-----------+------------+-------------+   SSV prox calf no                            0.18                   +--------------+---------+------+-----------+------------+-------------+  SSV mid calf  no                            0.23                   +--------------+---------+------+-----------+------------+-------------+  AASV O        no                            0.54                   +--------------+---------+------+-----------+------------+-------------+  AASV P        no                            0.40                   +--------------+---------+------+-----------+------------+-------------+  AASV P-m      no                            0.37                     Medical Decision Making   Brandi Shaffer is a 77 y.o. female who presents for evaluation of LLE swelling  Based on the patient's duplex, there is reflux in the left greater saphenous vein at the saphenofemoral junction.  The remainder of the deep and superficial venous system is competent.  There is no evidence of DVT on exam.  Given that the majority of her greater saphenous vein is competent, she would not be a candidate for ablation. She has a chronic history of lower extremity edema, worse on the left.  This has acutely worsened over the past month.  Her left leg is normal in the mornings and then becomes fairly swollen by the end the day.  She spends most of her day on her feet.  Her swelling causes her leg to feel tight and uncomfortable. On exam she has 1+ right lower extremity edema.  She has 2+ left lower extremity edema from the ankle to the knee.  There is no evidence of cellulitis or ulceration.  I explained to the patient her swelling is due to venous insufficiency.  She would benefit from conservative treatment including compression, leg elevation, exercise, and weight control. I have shown her how to wrap her leg with an ace wrap since she  cannot tolerate compression stockings. She can continue to take Lasix if this benefits  her She can follow up with our office as needed  Ernestene Mention, PA-C [facility] Office: [phone]  12/20/2022, 3:21 PM  Clinic MD: Karin Lieu

## 2022-12-22 ENCOUNTER — Other Ambulatory Visit: Payer: Self-pay | Admitting: Internal Medicine

## 2022-12-24 ENCOUNTER — Other Ambulatory Visit: Payer: Self-pay

## 2022-12-25 ENCOUNTER — Other Ambulatory Visit: Payer: Self-pay

## 2022-12-25 ENCOUNTER — Other Ambulatory Visit: Payer: Self-pay | Admitting: Internal Medicine

## 2022-12-31 ENCOUNTER — Ambulatory Visit: Payer: 59 | Admitting: Dermatology

## 2023-01-03 ENCOUNTER — Encounter: Payer: Self-pay | Admitting: Dermatology

## 2023-01-03 ENCOUNTER — Other Ambulatory Visit: Payer: Self-pay

## 2023-01-03 ENCOUNTER — Other Ambulatory Visit: Payer: Self-pay | Admitting: Sports Medicine

## 2023-01-03 ENCOUNTER — Ambulatory Visit (INDEPENDENT_AMBULATORY_CARE_PROVIDER_SITE_OTHER): Payer: 59 | Admitting: Dermatology

## 2023-01-03 ENCOUNTER — Other Ambulatory Visit: Payer: Self-pay | Admitting: Internal Medicine

## 2023-01-03 DIAGNOSIS — M793 Panniculitis, unspecified: Secondary | ICD-10-CM | POA: Diagnosis not present

## 2023-01-03 MED ORDER — PENTOXIFYLLINE ER 400 MG PO TBCR
400.0000 mg | EXTENDED_RELEASE_TABLET | Freq: Three times a day (TID) | ORAL | 0 refills | Status: DC
Start: 2023-01-03 — End: 2023-10-07

## 2023-01-03 MED ORDER — TRIAMCINOLONE ACETONIDE 0.1 % EX CREA
1.0000 | TOPICAL_CREAM | Freq: Every day | CUTANEOUS | 0 refills | Status: DC
Start: 2023-01-03 — End: 2023-05-06

## 2023-01-03 NOTE — Progress Notes (Signed)
   New Patient Visit   Subjective  Brandi Shaffer is a 77 y.o. female who presents for the following: New Pt   Patient states she has cellulitis located at the left calf that she would like to have examined. Patient reports the areas have been there for 1 month. She reports the areas are bothersome. Patient rates irritation (painful) 7 out of 10. She states that the areas have spread. Patient reports she has previously been treated for these areas by PCP, [facility]. She was Rx'd Bactrium, Gabapentin, & Prednisone and she has completed all therapy. Patient denies Hx of bx. Patient denies family history of skin cancer(s).  The patient has spots, moles and lesions to be evaluated, some may be new or changing and the patient may have concern these could be cancer.   The following portions of the chart were reviewed this encounter and updated as appropriate: medications, allergies, medical history  Review of Systems:  No other skin or systemic complaints except as noted in HPI or Assessment and Plan.  Objective  Well appearing patient in no apparent distress; mood and affect are within normal limits.   A focused examination was performed of the following areas: Left calf   Relevant exam findings are noted in the Assessment and Plan.        Assessment & Plan   1. Lipodermatosclerosis - Assessment:  Thick, indurated skin from ankles to mid-lower calf bilaterally. Moderate severity - Plan: Prescribe triamcinolone cream, to be applied twice daily for two weeks, followed by a break and the use of CeraVe with Urea as a moisturizer.   -Recommend wearing Velcro compression stockings for a few hours daily during walking or standing.  - -Prescribe pentoxifylline to improve blood flow and circulation.   -Encourage exercises such as lifting heels and toes when standing at the counter or brushing teeth, as outlined in the provided pamphlet. -     Return in about 3 months (around  04/05/2023).    Documentation: I have reviewed the above documentation for accuracy and completeness, and I agree with the above.   I, Brandi Shaffer, CMA, am acting as scribe for [employer], DO.   Langston Reusing, DO

## 2023-01-03 NOTE — Patient Instructions (Addendum)
Hello [name],  Thank you for visiting my office today. Your dedication to addressing and improving your health concerns is greatly appreciated. Here is a summary of the key instructions and recommendations from our consultation:  Diagnosis: Lipodermatosclerosis - Medications Prescribed:   - Triamcinolone Cream: Apply twice daily for 2 weeks, then pause and switch to a moisturizer like CeraVe with Urea. Ensure to massage the cream into the skin to help loosen it.   - Pentoxifylline: This medication is to improve blood flow and circulation by making blood cells more flexible.   - Furosemide: Use as directed when you notice swelling, especially in your feet and toes, to help reduce pain associated with swelling.  - Lifestyle and Behavioral Changes:   - Compression Stockings: Wear Velcro compression stockings for a few hours daily during periods of prolonged standing or walking, such as when walking dogs, doing dishes, or housework.   - Leg Elevation: Regularly elevate your legs to aid in reducing swelling, especially when sitting.   - Exercises: Follow the exercises outlined in the pamphlet provided, focusing on heel and toe lifts. Perform these exercises daily, such as when standing at the counter or brushing your teeth.  - Dietary Considerations:   - Monitor and possibly reduce salt intake to manage and prevent swelling, especially during holidays or when eating out at restaurants.  - Follow-Up Appointment:   - We will schedule a follow-up in 3 months to assess progress and make any necessary adjustments to your treatment plan.  Improvements may take months to become noticeable. Consistency with the prescribed regimen is key to preventing the condition from worsening and potentially reversing some changes.  Please do not hesitate to reach out if you have any questions or concerns before our next appointment.  Wishing you the best in health,  Dr. Langston Reusing,   Dermatology    Important Information  Due to recent changes in healthcare laws, you may see results of your pathology and/or laboratory studies on MyChart before the doctors have had a chance to review them. We understand that in some cases there may be results that are confusing or concerning to you. Please understand that not all results are received at the same time and often the doctors may need to interpret multiple results in order to provide you with the best plan of care or course of treatment. Therefore, we ask that you please give [place] 2 business days to thoroughly review all your results before contacting the office for clarification. Should we see a critical lab result, you will be contacted sooner.   If You Need Anything After Your Visit  If you have any questions or concerns for your doctor, please call our main line at [phone] If no one answers, please leave a voicemail as directed and we will return your call as soon as possible. Messages left after 4 pm will be answered the following business day.   You may also send [place] a message via MyChart. We typically respond to MyChart messages within 1-2 business days.  For prescription refills, please ask your pharmacy to contact our office. Our fax number is [phone].  If you have an urgent issue when the clinic is closed that cannot wait until the next business day, you can page your doctor at the number below.    Please note that while we do our best to be available for urgent issues outside of office hours, we are not available 24/7.   If  you have an urgent issue and are unable to reach [place], you may choose to seek medical care at your doctor's office, retail clinic, urgent care center, or emergency room.  If you have a medical emergency, please immediately call 911 or go to the emergency department. In the event of inclement weather, please call our main line at [phone] for an update on the status of any delays or  closures.  Dermatology Medication Tips: Please keep the boxes that topical medications come in in order to help keep track of the instructions about where and how to use these. Pharmacies typically print the medication instructions only on the boxes and not directly on the medication tubes.   If your medication is too expensive, please contact our office at [phone] or send [place] a message through MyChart.   We are unable to tell what your co-pay for medications will be in advance as this is different depending on your insurance coverage. However, we may be able to find a substitute medication at lower cost or fill out paperwork to get insurance to cover a needed medication.   If a prior authorization is required to get your medication covered by your insurance company, please allow [place] 1-2 business days to complete this process.  Drug prices often vary depending on where the prescription is filled and some pharmacies may offer cheaper prices.  The website www.goodrx.com contains coupons for medications through different pharmacies. The prices here do not account for what the cost may be with help from insurance (it may be cheaper with your insurance), but the website can give you the price if you did not use any insurance.  - You can print the associated coupon and take it with your prescription to the pharmacy.  - You may also stop by our office during regular business hours and pick up a GoodRx coupon card.  - If you need your prescription sent electronically to a different pharmacy, notify our office through [facility] or by phone at [phone]

## 2023-01-17 ENCOUNTER — Ambulatory Visit: Payer: 59 | Admitting: Orthopedic Surgery

## 2023-01-17 ENCOUNTER — Encounter: Payer: Self-pay | Admitting: Orthopedic Surgery

## 2023-01-17 DIAGNOSIS — M19172 Post-traumatic osteoarthritis, left ankle and foot: Secondary | ICD-10-CM | POA: Diagnosis not present

## 2023-01-17 DIAGNOSIS — I872 Venous insufficiency (chronic) (peripheral): Secondary | ICD-10-CM | POA: Diagnosis not present

## 2023-01-17 DIAGNOSIS — M79662 Pain in left lower leg: Secondary | ICD-10-CM | POA: Diagnosis not present

## 2023-01-17 NOTE — Progress Notes (Signed)
Office Visit Note   Patient: Brandi Shaffer           Date of Birth: Jul 25, 1945           MRN: [id number] Visit Date: 01/17/2023              Requested by: Corwin Levins, MD [address] [place],  [place] [address] PCP: Corwin Levins, MD  Chief Complaint  Patient presents with   Left Leg - Follow-up      HPI: Patient is a 77 year old woman who is seen in follow-up for venous insufficiency left lower extremity.  Patient has been provided compression socks she is not wearing them today.  Patient states that she has tried Voltaren gel for the traumatic arthritis.  Patient has been seen by vascular as well.  She states she went to dermatology and was provided triamcinolone cream and Trental  Assessment & Plan: Visit Diagnoses:  1. Pain of left calf   2. Venous stasis dermatitis of left lower extremity   3. Post-traumatic arthritis of ankle, left     Plan: Recommended patient continue the Trental for the venous insufficiency.  Recommended stopping the triamcinolone cream.  Recommended using compression exercise and elevation to help resolve the swelling.  Follow-Up Instructions: No follow-ups on file.   Ortho Exam  Patient is alert, oriented, no adenopathy, well-dressed, normal affect, normal respiratory effort. Examination there is no cellulitis or inflammation of the left lower extremity she does have chronic brawny edema without ulceration.  Patient has palpable pulse.  There is no pain with dorsiflexion of the ankle and no signs or symptoms of a DVT.  Imaging: No results found. No images are attached to the encounter.  Labs: Lab Results  Component Value Date   HGBA1C 5.9 09/24/2022   HGBA1C 6.0 01/11/2022   HGBA1C 5.6 07/11/2021   ESRSEDRATE 8 12/11/2022   ESRSEDRATE 30 12/10/2022   ESRSEDRATE 35 (H) 12/13/2014   CRP 0.7 12/11/2022   CRP <1.0 12/10/2022   CRP 0.8 12/13/2014   LABURIC 5.6 12/10/2022   REPTSTATUS 12/16/2022 FINAL 12/11/2022   CULT  12/11/2022     NO GROWTH 5 DAYS Performed at [facility], [address] [place], [place] [address]    LABORGA PROTEUS MIRABILIS 12/12/2006     Lab Results  Component Value Date   ALBUMIN 4.3 12/10/2022   ALBUMIN 4.1 09/24/2022   ALBUMIN 4.3 04/03/2022    No results found for: "MG" Lab Results  Component Value Date   VD25OH 21.55 (L) 09/24/2022   VD25OH 33.64 01/11/2022   VD25OH 23.26 (L) 07/11/2021    No results found for: "PREALBUMIN"    Latest Ref Rng & Units 12/11/2022    8:04 PM 12/10/2022   12:29 PM 09/24/2022   10:21 AM  CBC EXTENDED  WBC 4.0 - 10.5 K/uL 6.6  5.5  4.8   RBC 3.87 - 5.11 MIL/uL 4.27  4.35  4.23   Hemoglobin 12.0 - 15.0 g/dL 24.4  01.0  27.2   HCT 36.0 - 46.0 % 39.5  40.3  39.8   Platelets 150 - 400 K/uL 287  278.0  237.0   NEUT# 1.7 - 7.7 K/uL 2.8  2.7  2.0   Lymph# 0.7 - 4.0 K/uL 3.0  2.1  2.2      There is no height or weight on file to calculate BMI.  Orders:  No orders of the defined types were placed in this encounter.  No  orders of the defined types were placed in this encounter.    Procedures: No procedures performed  Clinical Data: No additional findings.  ROS:  All other systems negative, except as noted in the HPI. Review of Systems  Objective: Vital Signs: There were no vitals taken for this visit.  Specialty Comments:  No specialty comments available.  PMFS History: Patient Active Problem List   Diagnosis Date Noted   Chronic kidney disease, stage 2 (mild) 12/12/2022   Fatty liver 12/12/2022   Multiple skin nodules 06/28/2022   Dental infection 04/05/2022   Peripheral edema 03/26/2022   Leg length discrepancy 09/28/2020   Greater trochanteric bursitis, right 08/09/2020   Posterior vitreous detachment of right eye 02/18/2020   Posterior vitreous detachment of left eye 02/18/2020   Diabetes mellitus without complication (HCC) 02/18/2020   Nuclear sclerotic cataract of both eyes 02/18/2020   Urinary frequency  01/11/2020   Pelvic pain 01/11/2020   Vitamin D deficiency 01/11/2020   B12 deficiency 01/11/2020   Superficial phlebitis 09/03/2019   Pain of right thumb 01/06/2019   Sprain of second toe, left, initial encounter 12/18/2017   Acute upper respiratory infection 01/04/2017   Greater trochanteric bursitis of left hip 12/19/2015   Right otitis media 12/16/2015   Shoulder dislocation 08/03/2015   Anxiety state 07/01/2015   Cellulitis 04/01/2015   Osteoarthritis of left ankle 04/01/2015   Bilateral leg pain 02/15/2015   PVD (peripheral vascular disease) (HCC) 12/22/2014   Cellulitis of left lower leg 12/13/2014   Right leg pain 11/10/2014   Hx of colonic polyps 09/08/2013   Obesity (BMI 30-39.9) 09/08/2013   Impingement syndrome of left shoulder 06/04/2012   Mild aortic stenosis 12/05/2011   Asthma 12/05/2011   Renal cyst 07/05/2011   Low back pain 06/28/2011   GERD (gastroesophageal reflux disease) 06/28/2011   Carpal tunnel syndrome of right wrist 04/22/2011   Right foot pain 05/23/2010   Encounter for well adult exam with abnormal findings 05/23/2010   Genital herpes 03/24/2010   Hyperlipidemia 03/24/2010   Nocturia 03/24/2010   LIPOMA 12/06/2009   ANEMIA-IRON DEFICIENCY 12/06/2009   CONJUNCTIVITIS, BILATERAL 12/06/2009   DIVERTICULOSIS, COLON 12/06/2009   Hypothyroidism 09/17/2006   Diabetes (HCC) 09/17/2006   Essential hypertension 09/17/2006   Past Medical History:  Diagnosis Date   ANEMIA-IRON DEFICIENCY 12/06/2009   Anxiety state 07/01/2015   Asthma 12/05/2011   DIABETES MELLITUS, TYPE II 09/17/2006   DIVERTICULOSIS, COLON 12/06/2009   GENITAL HERPES 03/24/2010   GERD (gastroesophageal reflux disease) 06/28/2011   HYPERLIPIDEMIA 03/24/2010   HYPERTENSION 09/17/2006   HYPOTHYROIDISM 09/17/2006   LIPOMA 12/06/2009   PERIPHERAL EDEMA 03/24/2010   Renal cyst 07/05/2011   1.9 cm minimally complex    Family History  Problem Relation Age of Onset   Hypertension Mother     Hypertension Father    Heart disease Sister        sudden death early 76's   Stroke Sister    Breast cancer Sister 58   Prostate cancer Brother    Diabetes Other    Joint hypermobility Other        DJD   Colon cancer Neg Hx    Colon polyps Neg Hx    Esophageal cancer Neg Hx    Stomach cancer Neg Hx    Rectal cancer Neg Hx     Past Surgical History:  Procedure Laterality Date   ABDOMINAL HYSTERECTOMY     fibroids   ankle surgury     x 3  left - chronic pain/swelling   COLONOSCOPY  2011   SHOULDER SURGERY Left    TONSILLECTOMY     TUBAL LIGATION     Social History   Occupational History   Occupation: CASHIER/ retired    Comment: gas station, stands for work  Tobacco Use   Smoking status: Never   Smokeless tobacco: Never  Vaping Use   Vaping status: Never Used  Substance and Sexual Activity   Alcohol use: No    Alcohol/week: 0.0 standard drinks of alcohol   Drug use: No   Sexual activity: Not Currently

## 2023-01-29 ENCOUNTER — Telehealth: Payer: Self-pay | Admitting: Internal Medicine

## 2023-01-29 NOTE — Telephone Encounter (Signed)
Patient called and said that her co pay for the albuterol inhaler has gone up to $40.00.  She wants the albuterol that she was on before sent in because she does not have to pay for it.  Please call patient [phone]

## 2023-01-30 MED ORDER — ALBUTEROL SULFATE HFA 108 (90 BASE) MCG/ACT IN AERS: INHALATION_SPRAY | RESPIRATORY_TRACT | 5 refills | Status: DC

## 2023-01-30 NOTE — Telephone Encounter (Signed)
Ok I have refilled but the EMR will not let me order without a name like ventolin on the script  I did put in pharmacy note about NO BRAND NAME so hopefully the pharmacy will note this

## 2023-03-19 ENCOUNTER — Ambulatory Visit (INDEPENDENT_AMBULATORY_CARE_PROVIDER_SITE_OTHER): Payer: 59

## 2023-03-19 VITALS — Ht 59.0 in | Wt 186.0 lb

## 2023-03-19 DIAGNOSIS — Z0000 Encounter for general adult medical examination without abnormal findings: Secondary | ICD-10-CM

## 2023-03-19 DIAGNOSIS — Z780 Asymptomatic menopausal state: Secondary | ICD-10-CM | POA: Diagnosis not present

## 2023-03-19 DIAGNOSIS — Z1231 Encounter for screening mammogram for malignant neoplasm of breast: Secondary | ICD-10-CM | POA: Diagnosis not present

## 2023-03-19 NOTE — Progress Notes (Signed)
Subjective:   Brandi Shaffer is a 78 y.o. female who presents for Medicare Annual (Subsequent) preventive examination.  Visit Complete: Virtual I connected with  Brandi Shaffer on 03/19/23 by a audio enabled telemedicine application and verified that I am speaking with the correct person using two identifiers.  Patient Location: Home  Provider Location: Office/Clinic  I discussed the limitations of evaluation and management by telemedicine. The patient expressed understanding and agreed to proceed.  Vital Signs: Because this visit was a virtual/telehealth visit, some criteria may be missing or patient reported. Any vitals not documented were not able to be obtained and vitals that have been documented are patient reported.   Cardiac Risk Factors include: advanced age (>29men, >22 women);hypertension;Other (see comment);diabetes mellitus;dyslipidemia, Risk factor comments: Mild aortic stenosis, PVD, Asthma, CKD stage 2     Objective:    Today's Vitals   03/19/23 1009  Weight: 186 lb (84.4 kg)  Height: 4\' 11"  (1.499 m)   Body mass index is 37.57 kg/m.     03/19/2023   11:01 AM 12/11/2022    6:59 PM 09/14/2022    9:31 AM 08/09/2022    2:04 PM 03/05/2022    2:39 PM 03/03/2021   10:05 AM 01/12/2020    1:02 PM  Advanced Directives  Does Patient Have a Medical Advance Directive? No No No No No No No  Would patient like information on creating a medical advance directive?  No - Patient declined Yes (MAU/Ambulatory/Procedural Areas - Information given) No - Patient declined No - Patient declined No - Patient declined No - Patient declined    Current Medications (verified) Outpatient Encounter Medications as of 03/19/2023  Medication Sig   albuterol (VENTOLIN HFA) 108 (90 Base) MCG/ACT inhaler INHALE 2 PUFFS BY MOUTH EVERY 6 HOURS AS NEEDED FOR WHEEZING   amLODipine (NORVASC) 10 MG tablet TAKE 1 TABLET BY MOUTH EVERY DAY   ammonium lactate (LAC-HYDRIN) 12 % lotion APPLY 1 APPLICATION  TOPICALLY AS NEEDED FOR DRY SKIN.   APPLE CIDER VINEGAR PO Take by mouth.   aspirin 81 MG EC tablet TAKE 1 TABLET BY MOUTH DAILY. SWALLOW WHOLE.   chlorhexidine (PERIDEX) 0.12 % solution Use as directed 15 mLs in the mouth or throat 2 (two) times daily.   Cholecalciferol 50 MCG (2000 UT) TABS 1 tab by mouth once daily   losartan (COZAAR) 25 MG tablet Take 1 tablet (25 mg total) by mouth daily.   Misc Natural Products (CRANBERRY/PROBIOTIC PO) Take by mouth.   Multiple Vitamins-Minerals (WOMENS MULTIVITAMIN PO) Take 1 tablet by mouth daily.   OneTouch Delica Lancets 33G MISC 1 each by Does not apply route 2 (two) times daily. Use to check blood sugars twice a day Dx E11.9   ONETOUCH VERIO test strip USE 2 (TWO) TIMES DAILY. DX E11.9   pentoxifylline (TRENTAL) 400 MG CR tablet Take 1 tablet (400 mg total) by mouth 3 (three) times daily with meals.   pioglitazone (ACTOS) 15 MG tablet TAKE 1 TABLET BY MOUTH EVERY DAY   potassium chloride (KLOR-CON) 10 MEQ tablet TAKE 1 TABLET BY MOUTH EVERY DAY   triamcinolone cream (KENALOG) 0.1 % Apply 1 Application topically daily. (JAR)   vitamin B-12 (CYANOCOBALAMIN) 1000 MCG tablet 1 tab by mouth mon - wed - thur   clotrimazole (LOTRIMIN) 1 % cream Apply to affected feet and between toes twice daily for 6 weeks for athlete's feet (Patient not taking: Reported on 03/19/2023)   cyclobenzaprine (FLEXERIL) 5 MG tablet Take  1 tablet (5 mg total) by mouth 3 (three) times daily as needed for muscle spasms. (Patient not taking: Reported on 03/19/2023)   fluconazole (DIFLUCAN) 150 MG tablet Take 1 tablet (150 mg total) by mouth every three (3) days as needed. (Patient not taking: Reported on 03/19/2023)   furosemide (LASIX) 20 MG tablet Take 1 tablet (20 mg total) by mouth daily as needed. (Patient not taking: Reported on 03/19/2023)   gabapentin (NEURONTIN) 100 MG capsule Take 1 capsule (100 mg total) by mouth 3 (three) times daily. (Patient not taking: Reported on  03/19/2023)   predniSONE (DELTASONE) 20 MG tablet Take 2 pills for 3 days, 1 pill for 4 days (Patient not taking: Reported on 03/19/2023)   rosuvastatin (CRESTOR) 20 MG tablet TAKE 1 TABLET BY MOUTH EVERY DAY (Patient not taking: Reported on 03/19/2023)   sulfamethoxazole-trimethoprim (BACTRIM DS) 800-160 MG tablet Take 1 tablet by mouth 2 (two) times daily. (Patient not taking: Reported on 03/19/2023)   No facility-administered encounter medications on file as of 03/19/2023.    Allergies (verified) Januvia [sitagliptin phosphate], Lipitor [atorvastatin], Metformin and related, and Doxycycline   History: Past Medical History:  Diagnosis Date   ANEMIA-IRON DEFICIENCY 12/06/2009   Anxiety state 07/01/2015   Asthma 12/05/2011   DIABETES MELLITUS, TYPE II 09/17/2006   DIVERTICULOSIS, COLON 12/06/2009   GENITAL HERPES 03/24/2010   GERD (gastroesophageal reflux disease) 06/28/2011   HYPERLIPIDEMIA 03/24/2010   HYPERTENSION 09/17/2006   HYPOTHYROIDISM 09/17/2006   LIPOMA 12/06/2009   PERIPHERAL EDEMA 03/24/2010   Renal cyst 07/05/2011   1.9 cm minimally complex   Past Surgical History:  Procedure Laterality Date   ABDOMINAL HYSTERECTOMY     fibroids   ankle surgury     x 3 left - chronic pain/swelling   COLONOSCOPY  2011   SHOULDER SURGERY Left    TONSILLECTOMY     TUBAL LIGATION     Family History  Problem Relation Age of Onset   Hypertension Mother    Hypertension Father    Heart disease Sister        sudden death early 31's   Stroke Sister    Breast cancer Sister 53   Prostate cancer Brother    Diabetes Other    Joint hypermobility Other        DJD   Colon cancer Neg Hx    Colon polyps Neg Hx    Esophageal cancer Neg Hx    Stomach cancer Neg Hx    Rectal cancer Neg Hx    Social History   Socioeconomic History   Marital status: Divorced    Spouse name: Not on file   Number of children: 7   Years of education: Not on file   Highest education level: Not on file  Occupational  History   Occupation: CASHIER/ retired    Comment: gas station, stands for work  Tobacco Use   Smoking status: Never   Smokeless tobacco: Never  Vaping Use   Vaping status: Never Used  Substance and Sexual Activity   Alcohol use: No    Alcohol/week: 0.0 standard drinks of alcohol   Drug use: No   Sexual activity: Not Currently  Other Topics Concern   Not on file  Social History Narrative   Lives with grandson and and pets;    Have 6 sons and one dtr   Have grand- children; running children through out the day and cooking         Lives alone with one dog-2025  Social Drivers of Corporate investment banker Strain: Low Risk  (03/19/2023)   Overall Financial Resource Strain (CARDIA)    Difficulty of Paying Living Expenses: Not very hard  Food Insecurity: No Food Insecurity (03/19/2023)   Hunger Vital Sign    Worried About Running Out of Food in the Last Year: Never true    Ran Out of Food in the Last Year: Never true  Transportation Needs: No Transportation Needs (03/19/2023)   PRAPARE - Administrator, Civil Service (Medical): No    Lack of Transportation (Non-Medical): No  Physical Activity: Inactive (03/19/2023)   Exercise Vital Sign    Days of Exercise per Week: 0 days    Minutes of Exercise per Session: 0 min  Stress: No Stress Concern Present (03/19/2023)   [employer] of Occupational Health - Occupational Stress Questionnaire    Feeling of Stress : Not at all  Social Connections: Socially Isolated (03/19/2023)   Social Connection and Isolation Panel [NHANES]    Frequency of Communication with Friends and Family: More than three times a week    Frequency of Social Gatherings with Friends and Family: Once a week    Attends Religious Services: Never    Database administrator or Organizations: No    Attends Engineer, structural: Never    Marital Status: Divorced    Tobacco Counseling Counseling given: Not Answered   Clinical  Intake:  Pre-visit preparation completed: Yes  Pain : No/denies pain     BMI - recorded: 37.57 Nutritional Risks: None  How often do you need to have someone help you when you read instructions, pamphlets, or other written materials from your doctor or pharmacy?: 1 - Never  Interpreter Needed?: No  Information entered by :: Vaida Kerchner, RMA   Activities of Daily Living    03/19/2023   10:14 AM  In your present state of health, do you have any difficulty performing the following activities:  Hearing? 0  Vision? 0  Difficulty concentrating or making decisions? 1  Walking or climbing stairs? 1  Dressing or bathing? 1  Doing errands, shopping? 1  Preparing Food and eating ? Y  Using the Toilet? Y  In the past six months, have you accidently leaked urine? Y  Do you have problems with loss of bowel control? Y  Managing your Medications? Y  Managing your Finances? Y  Housekeeping or managing your Housekeeping? Y    Patient Care Team: Corwin Levins, MD as PCP - General Judi Saa, DO as Consulting Physician (Family Medicine) [name], DPM as Consulting Physician (Podiatry) Luciana Axe Alford Highland, MD as Consulting Physician (Ophthalmology) Glenford Peers, OD as Referring Physician (Optometry)  Indicate any recent Medical Services you may have received from other than [facility] providers in the past year (date may be approximate).     Assessment:   This is a routine wellness examination for [place].  Hearing/Vision screen Hearing Screening - Comments:: Denies hearing difficulties   Vision Screening - Comments:: Wears eyeglasses   Goals Addressed             This Visit's Progress    My goal for 2024 is to lose some weight. I want to do me this year.   On track    Still working on losing weight-2025      Depression Screen    03/19/2023   11:10 AM 12/10/2022   11:37 AM 11/09/2022   11:50 AM 09/24/2022  9:37 AM 09/14/2022    9:31 AM 06/25/2022   10:16  AM 03/26/2022    3:30 PM  PHQ 2/9 Scores  PHQ - 2 Score 0 0 0 0 0 0 0  PHQ- 9 Score 0 0     0    Fall Risk    03/19/2023   11:02 AM 12/10/2022   11:37 AM 11/09/2022   11:49 AM 09/24/2022    9:37 AM 09/14/2022    9:31 AM  Fall Risk   Falls in the past year? 0 0 0 0 0  Number falls in past yr: 0 0 0 0   Injury with Fall? 0 0 0    Risk for fall due to : No Fall Risks No Fall Risks No Fall Risks No Fall Risks   Follow up Falls evaluation completed;Falls prevention discussed Falls evaluation completed Falls evaluation completed Falls evaluation completed     MEDICARE RISK AT HOME: Medicare Risk at Home Any stairs in or around the home?: Yes (front and back) If so, are there any without handrails?: Yes Home free of loose throw rugs in walkways, pet beds, electrical cords, etc?: Yes Adequate lighting in your home to reduce risk of falls?: Yes Life alert?: No (had one but medicare is stopping to pay for it) Use of a cane, walker or w/c?: Yes (uses cane sometimes.) Grab bars in the bathroom?: Yes Shower chair or bench in shower?: Yes Elevated toilet seat or a handicapped toilet?: Yes  TIMED UP AND GO:  Was the test performed?  No    Cognitive Function:    11/14/2016   11:45 AM 06/29/2014    8:58 AM  MMSE - Mini Mental State Exam  Not completed:  Unable to complete  Orientation to time 5   Orientation to Place 5   Registration 3   Attention/ Calculation 4   Recall 2   Language- name 2 objects 2   Language- repeat 1   Language- follow 3 step command 3   Language- read & follow direction 1   Write a sentence 1   Copy design 1   Total score 28         03/19/2023   10:15 AM 03/05/2022    2:40 PM 01/12/2020    1:04 PM  6CIT Screen  What Year? 0 points 0 points 0 points  What month? 0 points 0 points 0 points  What time? 0 points 0 points 0 points  Count back from 20 2 points 0 points 0 points  Months in reverse 4 points 0 points 0 points  Repeat phrase 2 points 0 points 0  points  Total Score 8 points 0 points 0 points    Immunizations Immunization History  Administered Date(s) Administered   Fluad Quad(high Dose 65+) 01/06/2019, 01/11/2021, 01/11/2022   Influenza Split 11/21/2010, 12/05/2011   Influenza Whole 12/06/2009   Influenza, High Dose Seasonal PF 12/04/2012, 12/04/2013, 11/14/2016, 11/15/2017   Influenza,inj,Quad PF,6+ Mos 12/15/2014   PFIZER(Purple Top)SARS-COV-2 Vaccination 05/23/2019, 05/27/2019, 06/13/2019   Pneumococcal Conjugate-13 12/19/2012   Pneumococcal Polysaccharide-23 11/27/2004, 12/06/2009, 06/25/2017   Td 11/27/2004, 06/08/2014    TDAP status: Up to date  Flu Vaccine status: Due, Education has been provided regarding the importance of this vaccine. Advised may receive this vaccine at local pharmacy or Health Dept. Aware to provide a copy of the vaccination record if obtained from local pharmacy or Health Dept. Verbalized acceptance and understanding.  Pneumococcal vaccine status: Up to date  Covid-19 vaccine  status: Declined, Education has been provided regarding the importance of this vaccine but patient still declined. Advised may receive this vaccine at local pharmacy or Health Dept.or vaccine clinic. Aware to provide a copy of the vaccination record if obtained from local pharmacy or Health Dept. Verbalized acceptance and understanding.  Qualifies for Shingles Vaccine? Yes   Zostavax completed No   Shingrix Completed?: No.    Education has been provided regarding the importance of this vaccine. Patient has been advised to call insurance company to determine out of pocket expense if they have not yet received this vaccine. Advised may also receive vaccine at local pharmacy or Health Dept. Verbalized acceptance and understanding.  Screening Tests Health Maintenance  Topic Date Due   Zoster Vaccines- Shingrix (1 of 2) Never done   INFLUENZA VACCINE  09/27/2022   HEMOGLOBIN A1C  03/27/2023   Diabetic kidney evaluation - Urine  ACR  09/24/2023   FOOT EXAM  09/24/2023   Diabetic kidney evaluation - eGFR measurement  12/11/2023   OPHTHALMOLOGY EXAM  12/13/2023   Medicare Annual Wellness (AWV)  03/18/2024   DTaP/Tdap/Td (3 - Tdap) 06/07/2024   Pneumonia Vaccine 80+ Years old  Completed   DEXA SCAN  Completed   Hepatitis C Screening  Completed   HPV VACCINES  Aged Out   Colonoscopy  Discontinued   COVID-19 Vaccine  Discontinued    Health Maintenance  Health Maintenance Due  Topic Date Due   Zoster Vaccines- Shingrix (1 of 2) Never done   INFLUENZA VACCINE  09/27/2022    Colorectal cancer screening: No longer required.   Mammogram status: Ordered 03/19/2023. Pt provided with contact info and advised to call to schedule appt.   Bone Density status: Ordered 03/19/2023. Pt provided with contact info and advised to call to schedule appt.  Lung Cancer Screening: (Low Dose CT Chest recommended if Age 43-80 years, 20 pack-year currently smoking OR have quit w/in 15years.) does not qualify.   Lung Cancer Screening Referral: N/A  Additional Screening:  Hepatitis C Screening: does qualify; Completed 02/02/2020  Vision Screening: Recommended annual ophthalmology exams for early detection of glaucoma and other disorders of the eye. Is the patient up to date with their annual eye exam?  Yes  Who is the provider or what is the name of the office in which the patient attends annual eye exams? [facility] If pt is not established with a provider, would they like to be referred to a provider to establish care? No .   Dental Screening: Recommended annual dental exams for proper oral hygiene  Diabetic Foot Exam: Diabetic Foot Exam: Completed 09/24/2022  Community Resource Referral / Chronic Care Management: CRR required this visit?  No   CCM required this visit?  No     Plan:     I have personally reviewed and noted the following in the patient's chart:   Medical and social history Use  of alcohol, tobacco or illicit drugs  Current medications and supplements including opioid prescriptions. Patient is not currently taking opioid prescriptions. Functional ability and status Nutritional status Physical activity Advanced directives List of other physicians Hospitalizations, surgeries, and ER visits in previous 12 months Vitals Screenings to include cognitive, depression, and falls Referrals and appointments  In addition, I have reviewed and discussed with patient certain preventive protocols, quality metrics, and best practice recommendations. A written personalized care plan for preventive services as well as general preventive health recommendations were provided to patient.  Dolph Tavano L Kamalani Mastro, CMA   03/19/2023   After Visit Summary: (MyChart) Due to this being a telephonic visit, the after visit summary with patients personalized plan was offered to patient via MyChart   Nurse Notes: Patient stated that she has gotten her Flu vaccine here, however, it has not been documented in [place] or chart.  She is due for a mammogram and a DEXA, which orders have been placed today.  Patient declines the Shingrix vaccine.  Patient had no other concerns to address today.

## 2023-03-19 NOTE — Patient Instructions (Addendum)
Brandi Shaffer , Thank you for taking time to come for your Medicare Wellness Visit. I appreciate your ongoing commitment to your health goals. Please review the following plan we discussed and let me know if I can assist you in the future.   Referrals/Orders/Follow-Ups/Clinician Recommendations: It was nice talking to you today.  Keep you the good work and remember you have an order for:  [x]   3D Mammogram  [x]   Bone Density     Please call for appointment:  [facility] [address] [place], [place] [address] [phone]  Make sure to wear two-piece clothing.  No lotions, powders, or deodorants the day of the appointment. Make sure to bring picture ID and insurance card.  Bring list of medications you are currently taking including any supplements.   Schedule your [place] screening mammogram through MyChart!   Log into your MyChart account.  Go to 'Visit' (or 'Appointments' if on mobile App) --> Schedule an Appointment  Under 'Select a Reason for Visit' choose the Mammogram Screening option.  Complete the pre-visit questions and select the time and place that best fits your schedule.    This is a list of the screening recommended for you and due dates:  Health Maintenance  Topic Date Due   Zoster (Shingles) Vaccine (1 of 2) Never done   Flu Shot  09/27/2022   Hemoglobin A1C  03/27/2023   Yearly kidney health urinalysis for diabetes  09/24/2023   Complete foot exam   09/24/2023   Yearly kidney function blood test for diabetes  12/11/2023   Eye exam for diabetics  12/13/2023   Medicare Annual Wellness Visit  03/18/2024   DTaP/Tdap/Td vaccine (3 - Tdap) 06/07/2024   Pneumonia Vaccine  Completed   DEXA scan (bone density measurement)  Completed   Hepatitis C Screening  Completed   HPV Vaccine  Aged Out   Colon Cancer Screening  Discontinued   COVID-19 Vaccine  Discontinued    Advanced directives: (Declined) Advance directive discussed with you  today. Even though you declined this today, please call our office should you change your mind, and we can give you the proper paperwork for you to fill out.  Next Medicare Annual Wellness Visit scheduled for next year: Yes

## 2023-03-20 ENCOUNTER — Other Ambulatory Visit: Payer: Self-pay | Admitting: Internal Medicine

## 2023-03-20 DIAGNOSIS — K047 Periapical abscess without sinus: Secondary | ICD-10-CM

## 2023-03-27 ENCOUNTER — Ambulatory Visit (INDEPENDENT_AMBULATORY_CARE_PROVIDER_SITE_OTHER): Payer: 59

## 2023-03-27 ENCOUNTER — Ambulatory Visit: Payer: 59 | Admitting: Internal Medicine

## 2023-03-27 ENCOUNTER — Encounter: Payer: Self-pay | Admitting: Internal Medicine

## 2023-03-27 VITALS — BP 138/84 | HR 91 | Ht 59.0 in | Wt 183.0 lb

## 2023-03-27 DIAGNOSIS — R011 Cardiac murmur, unspecified: Secondary | ICD-10-CM | POA: Diagnosis not present

## 2023-03-27 DIAGNOSIS — E1165 Type 2 diabetes mellitus with hyperglycemia: Secondary | ICD-10-CM | POA: Diagnosis not present

## 2023-03-27 DIAGNOSIS — Z0001 Encounter for general adult medical examination with abnormal findings: Secondary | ICD-10-CM | POA: Diagnosis not present

## 2023-03-27 DIAGNOSIS — Z23 Encounter for immunization: Secondary | ICD-10-CM

## 2023-03-27 DIAGNOSIS — E039 Hypothyroidism, unspecified: Secondary | ICD-10-CM | POA: Diagnosis not present

## 2023-03-27 DIAGNOSIS — I700 Atherosclerosis of aorta: Secondary | ICD-10-CM | POA: Diagnosis not present

## 2023-03-27 DIAGNOSIS — E538 Deficiency of other specified B group vitamins: Secondary | ICD-10-CM

## 2023-03-27 DIAGNOSIS — I10 Essential (primary) hypertension: Secondary | ICD-10-CM | POA: Diagnosis not present

## 2023-03-27 DIAGNOSIS — Z7984 Long term (current) use of oral hypoglycemic drugs: Secondary | ICD-10-CM | POA: Diagnosis not present

## 2023-03-27 DIAGNOSIS — E7800 Pure hypercholesterolemia, unspecified: Secondary | ICD-10-CM | POA: Diagnosis not present

## 2023-03-27 DIAGNOSIS — I517 Cardiomegaly: Secondary | ICD-10-CM | POA: Diagnosis not present

## 2023-03-27 DIAGNOSIS — E559 Vitamin D deficiency, unspecified: Secondary | ICD-10-CM

## 2023-03-27 LAB — BASIC METABOLIC PANEL
BUN: 18 mg/dL (ref 6–23)
CO2: 30 meq/L (ref 19–32)
Calcium: 9.1 mg/dL (ref 8.4–10.5)
Chloride: 106 meq/L (ref 96–112)
Creatinine, Ser: 0.87 mg/dL (ref 0.40–1.20)
GFR: 63.88 mL/min (ref >60.00)
Glucose, Bld: 83 mg/dL (ref 70–99)
Potassium: 3.6 meq/L (ref 3.5–5.1)
Sodium: 142 meq/L (ref 135–145)

## 2023-03-27 LAB — CBC WITH DIFFERENTIAL/PLATELET
Basophils Absolute: 0 10*3/uL (ref 0.0–0.1)
Basophils Relative: 0.7 % (ref 0.0–3.0)
Eosinophils Absolute: 0.2 10*3/uL (ref 0.0–0.7)
Eosinophils Relative: 3.2 % (ref 0.0–5.0)
HCT: 42.2 % (ref 36.0–46.0)
Hemoglobin: 13.9 g/dL (ref 12.0–15.0)
Lymphocytes Relative: 44.9 % (ref 12.0–46.0)
Lymphs Abs: 2.4 10*3/uL (ref 0.7–4.0)
MCHC: 33 g/dL (ref 30.0–36.0)
MCV: 94 fL (ref 78.0–100.0)
Monocytes Absolute: 0.5 10*3/uL (ref 0.1–1.0)
Monocytes Relative: 8.4 % (ref 3.0–12.0)
Neutro Abs: 2.3 10*3/uL (ref 1.4–7.7)
Neutrophils Relative %: 42.8 % — ABNORMAL LOW (ref 43.0–77.0)
Platelets: 265 10*3/uL (ref 150.0–400.0)
RBC: 4.49 Mil/uL (ref 3.87–5.11)
RDW: 14.2 % (ref 11.5–15.5)
WBC: 5.4 10*3/uL (ref 4.0–10.5)

## 2023-03-27 LAB — URINALYSIS, ROUTINE W REFLEX MICROSCOPIC
Bilirubin Urine: NEGATIVE
Hgb urine dipstick: NEGATIVE
Ketones, ur: NEGATIVE
Nitrite: NEGATIVE
Specific Gravity, Urine: 1.015 (ref 1.000–1.030)
Total Protein, Urine: NEGATIVE
Urine Glucose: NEGATIVE
Urobilinogen, UA: 0.2 (ref 0.0–1.0)
pH: 7.5 (ref 5.0–8.0)

## 2023-03-27 LAB — LIPID PANEL
Cholesterol: 196 mg/dL (ref 0–200)
HDL: 60 mg/dL (ref >39.00)
LDL Cholesterol: 120 mg/dL — ABNORMAL HIGH (ref 0–99)
NonHDL: 135.51
Total CHOL/HDL Ratio: 3
Triglycerides: 76 mg/dL (ref 0.0–149.0)
VLDL: 15.2 mg/dL (ref 0.0–40.0)

## 2023-03-27 LAB — VITAMIN B12: Vitamin B-12: 882 pg/mL (ref 211–911)

## 2023-03-27 LAB — HEPATIC FUNCTION PANEL
ALT: 12 U/L (ref 0–35)
AST: 20 U/L (ref 0–37)
Albumin: 4.3 g/dL (ref 3.5–5.2)
Alkaline Phosphatase: 113 U/L (ref 39–117)
Bilirubin, Direct: 0.1 mg/dL (ref 0.0–0.3)
Total Bilirubin: 0.5 mg/dL (ref 0.2–1.2)
Total Protein: 7.1 g/dL (ref 6.0–8.3)

## 2023-03-27 LAB — MICROALBUMIN / CREATININE URINE RATIO
Creatinine,U: 62.9 mg/dL
Microalb Creat Ratio: 1.3 mg/g (ref 0.0–30.0)
Microalb, Ur: 0.8 mg/dL (ref 0.0–1.9)

## 2023-03-27 LAB — HEMOGLOBIN A1C: Hgb A1c MFr Bld: 6.2 % (ref 4.6–6.5)

## 2023-03-27 LAB — TSH: TSH: 1.72 u[IU]/mL (ref 0.35–5.50)

## 2023-03-27 LAB — VITAMIN D 25 HYDROXY (VIT D DEFICIENCY, FRACTURES): VITD: 20.96 ng/mL — ABNORMAL LOW (ref 30.00–100.00)

## 2023-03-27 NOTE — Progress Notes (Signed)
Patient ID: Brandi Shaffer, female   DOB: 02/25/1946, 78 y.o.   MRN: [id number]         Chief Complaint:: wellness exam and dm, heart murmur, low b12, htn, hld, low thyroid, low vit d       HPI:  Brandi Shaffer is a 78 y.o. female here for wellness exam; for shingrix at pharmacy, for flu shot and prevnar 20 today,  o/w up to date                        Also Pt denies chest pain, increased sob or doe, wheezing, orthopnea, PND, increased LE swelling, palpitations, dizziness or syncope.  Pt denies polydipsia, polyuria, or new focal neuro s/s.    Pt denies fever, wt loss, night sweats, loss of appetite, or other constitutional symptoms  Denies hyper or hypo thyroid symptoms such as voice, skin or hair change.     Wt Readings from Last 3 Encounters:  03/27/23 183 lb (83 kg)  03/19/23 186 lb (84.4 kg)  12/20/22 190 lb 14.4 oz (86.6 kg)   BP Readings from Last 3 Encounters:  03/27/23 138/84  12/20/22 (!) 154/81  12/12/22 (!) 146/66   Immunization History  Administered Date(s) Administered   Fluad Quad(high Dose 65+) 01/06/2019, 01/11/2021, 01/11/2022   Fluad Trivalent(High Dose 65+) 03/27/2023   Influenza Split 11/21/2010, 12/05/2011   Influenza Whole 12/06/2009   Influenza, High Dose Seasonal PF 12/04/2012, 12/04/2013, 11/14/2016, 11/15/2017   Influenza,inj,Quad PF,6+ Mos 12/15/2014   PFIZER(Purple Top)SARS-COV-2 Vaccination 05/23/2019, 05/27/2019, 06/13/2019   PNEUMOCOCCAL CONJUGATE-20 03/27/2023   Pneumococcal Conjugate-13 12/19/2012   Pneumococcal Polysaccharide-23 11/27/2004, 12/06/2009, 06/25/2017   Td 11/27/2004, 06/08/2014   Health Maintenance Due  Topic Date Due   Zoster Vaccines- Shingrix (1 of 2) Never done      Past Medical History:  Diagnosis Date   ANEMIA-IRON DEFICIENCY 12/06/2009   Anxiety state 07/01/2015   Asthma 12/05/2011   DIABETES MELLITUS, TYPE II 09/17/2006   DIVERTICULOSIS, COLON 12/06/2009   GENITAL HERPES 03/24/2010   GERD (gastroesophageal reflux  disease) 06/28/2011   HYPERLIPIDEMIA 03/24/2010   HYPERTENSION 09/17/2006   HYPOTHYROIDISM 09/17/2006   LIPOMA 12/06/2009   PERIPHERAL EDEMA 03/24/2010   Renal cyst 07/05/2011   1.9 cm minimally complex   Past Surgical History:  Procedure Laterality Date   ABDOMINAL HYSTERECTOMY     fibroids   ankle surgury     x 3 left - chronic pain/swelling   COLONOSCOPY  2011   SHOULDER SURGERY Left    TONSILLECTOMY     TUBAL LIGATION      reports that she has never smoked. She has never used smokeless tobacco. She reports that she does not drink alcohol and does not use drugs. family history includes Breast cancer (age of onset: 77) in her sister; Diabetes in an other family member; Heart disease in her sister; Hypertension in her father and mother; Joint hypermobility in an other family member; Prostate cancer in her brother; Stroke in her sister. Allergies  Allergen Reactions   Januvia [Sitagliptin Phosphate]     dizziness   Lipitor [Atorvastatin] Other (See Comments)    Myalgia    Metformin And Related Nausea Only   Doxycycline Rash   Current Outpatient Medications on File Prior to Visit  Medication Sig Dispense Refill   albuterol (VENTOLIN HFA) 108 (90 Base) MCG/ACT inhaler INHALE 2 PUFFS BY MOUTH EVERY 6 HOURS AS NEEDED FOR WHEEZING 18 each 5   amLODipine (NORVASC)  10 MG tablet TAKE 1 TABLET BY MOUTH EVERY DAY 90 tablet 3   ammonium lactate (LAC-HYDRIN) 12 % lotion APPLY 1 APPLICATION TOPICALLY AS NEEDED FOR DRY SKIN. 400 mL 5   APPLE CIDER VINEGAR PO Take by mouth.     aspirin 81 MG EC tablet TAKE 1 TABLET BY MOUTH DAILY. SWALLOW WHOLE. 30 tablet 11   chlorhexidine (PERIDEX) 0.12 % solution USE AS DIRECTED 473 mL 11   Cholecalciferol 50 MCG (2000 UT) TABS 1 tab by mouth once daily 30 tablet 99   clotrimazole (LOTRIMIN) 1 % cream Apply to affected feet and between toes twice daily for 6 weeks for athlete's feet 60 g 1   cyclobenzaprine (FLEXERIL) 5 MG tablet Take 1 tablet (5 mg total) by  mouth 3 (three) times daily as needed for muscle spasms. 30 tablet 1   fluconazole (DIFLUCAN) 150 MG tablet Take 1 tablet (150 mg total) by mouth every three (3) days as needed. 2 tablet 1   furosemide (LASIX) 20 MG tablet Take 1 tablet (20 mg total) by mouth daily as needed. 90 tablet 3   gabapentin (NEURONTIN) 100 MG capsule Take 1 capsule (100 mg total) by mouth 3 (three) times daily. 90 capsule 3   losartan (COZAAR) 25 MG tablet Take 1 tablet (25 mg total) by mouth daily. 90 tablet 3   Misc Natural Products (CRANBERRY/PROBIOTIC PO) Take by mouth.     Multiple Vitamins-Minerals (WOMENS MULTIVITAMIN PO) Take 1 tablet by mouth daily.     OneTouch Delica Lancets 33G MISC 1 each by Does not apply route 2 (two) times daily. Use to check blood sugars twice a day Dx E11.9 200 each 11   ONETOUCH VERIO test strip USE 2 (TWO) TIMES DAILY. DX E11.9 100 strip 4   pentoxifylline (TRENTAL) 400 MG CR tablet Take 1 tablet (400 mg total) by mouth 3 (three) times daily with meals. 270 tablet 0   pioglitazone (ACTOS) 15 MG tablet TAKE 1 TABLET BY MOUTH EVERY DAY 90 tablet 3   potassium chloride (KLOR-CON) 10 MEQ tablet TAKE 1 TABLET BY MOUTH EVERY DAY 90 tablet 3   predniSONE (DELTASONE) 20 MG tablet Take 2 pills for 3 days, 1 pill for 4 days 10 tablet 0   sulfamethoxazole-trimethoprim (BACTRIM DS) 800-160 MG tablet Take 1 tablet by mouth 2 (two) times daily. 14 tablet 0   triamcinolone cream (KENALOG) 0.1 % Apply 1 Application topically daily. (JAR) 453.6 g 0   vitamin B-12 (CYANOCOBALAMIN) 1000 MCG tablet 1 tab by mouth mon - wed - thur 90 tablet 3   No current facility-administered medications on file prior to visit.        ROS:  All others reviewed and negative.  Objective        PE:  BP 138/84 (BP Location: Left Arm, Patient Position: Sitting, Cuff Size: Normal)   Pulse 91   Ht 4\' 11"  (1.499 m)   Wt 183 lb (83 kg)   SpO2 98%   BMI 36.96 kg/m                 Constitutional: Pt appears in NAD                HENT: Head: NCAT.                Right Ear: External ear normal.                 Left Ear: External ear normal.  Eyes: . Pupils are equal, round, and reactive to light. Conjunctivae and EOM are normal               Nose: without d/c or deformity               Neck: Neck supple. Gross normal ROM               Cardiovascular: Normal rate and regular rhythm., gr 2/6 sys murmur                 Pulmonary/Chest: Effort normal and breath sounds without rales or wheezing.                Abd:  Soft, NT, ND, + BS, no organomegaly               Neurological: Pt is alert. At baseline orientation, motor grossly intact               Skin: Skin is warm. No rashes, no other new lesions, LE edema - none               Psychiatric: Pt behavior is normal without agitation   Micro: none  Cardiac tracings I have personally interpreted today:  none  Pertinent Radiological findings (summarize): none   Lab Results  Component Value Date   WBC 5.4 03/27/2023   HGB 13.9 03/27/2023   HCT 42.2 03/27/2023   PLT 265.0 03/27/2023   GLUCOSE 83 03/27/2023   CHOL 196 03/27/2023   TRIG 76.0 03/27/2023   HDL 60.00 03/27/2023   LDLDIRECT 134.6 05/23/2010   LDLCALC 120 (H) 03/27/2023   ALT 12 03/27/2023   AST 20 03/27/2023   NA 142 03/27/2023   K 3.6 03/27/2023   CL 106 03/27/2023   CREATININE 0.87 03/27/2023   BUN 18 03/27/2023   CO2 30 03/27/2023   TSH 1.72 03/27/2023   HGBA1C 6.2 03/27/2023   MICROALBUR 0.8 03/27/2023   Assessment/Plan:  KAMORAH NEVILS is a 78 y.o. Black or African American [2] female with  has a past medical history of ANEMIA-IRON DEFICIENCY (12/06/2009), Anxiety state (07/01/2015), Asthma (12/05/2011), DIABETES MELLITUS, TYPE II (09/17/2006), DIVERTICULOSIS, COLON (12/06/2009), GENITAL HERPES (03/24/2010), GERD (gastroesophageal reflux disease) (06/28/2011), HYPERLIPIDEMIA (03/24/2010), HYPERTENSION (09/17/2006), HYPOTHYROIDISM (09/17/2006), LIPOMA (12/06/2009), PERIPHERAL  EDEMA (03/24/2010), and Renal cyst (07/05/2011).  Encounter for well adult exam with abnormal findings Age and sex appropriate education and counseling updated with regular exercise and diet Referrals for preventative services - none needed Immunizations addressed - for shingrix at pharmacy, for flu shot and prevnar 20 today Smoking counseling  - none needed Evidence for depression or other mood disorder - none significant Most recent labs reviewed. I have personally reviewed and have noted: 1) the patient's medical and social history 2) The patient's current medications and supplements 3) The patient's height, weight, and BMI have been recorded in the chart   B12 deficiency Lab Results  Component Value Date   VITAMINB12 882 03/27/2023   Stable, cont oral replacement - b12 1000 mcg qd   Diabetes (HCC) Lab Results  Component Value Date   HGBA1C 6.2 03/27/2023   Stable, pt to continue current medical treatment actos 15 qd   Essential hypertension BP Readings from Last 3 Encounters:  03/27/23 138/84  12/20/22 (!) 154/81  12/12/22 (!) 146/66   Stable, pt to continue medical treatment norvasc 10 every day, losartan 25 qd   Hyperlipidemia Lab Results  Component Value Date   LDLCALC 120 (H)  03/27/2023   Uncontrolled, pt to restart crestor 40 qd   Hypothyroidism Lab Results  Component Value Date   TSH 1.72 03/27/2023   Stable, pt does not require med at this time   Vitamin D deficiency Last vitamin D Lab Results  Component Value Date   VD25OH 20.96 (L) 03/27/2023   Low, to start oral replacement   Heart murmur Echo benign, also for cxr today  Followup: No follow-ups on file.  Oliver Barre, MD 03/31/2023 5:45 PM [facility]

## 2023-03-27 NOTE — Patient Instructions (Signed)
You had the flu shot today, and the Prevnar 20 pneumonia shot today  Please continue all other medications as before, and refills have been done if requested.  Please have the pharmacy call with any other refills you may need.  Please continue your efforts at being more active, low cholesterol diet, and weight control.  You are otherwise up to date with prevention measures today.  Please keep your appointments with your specialists as you may have planned  Please go to the XRAY Department in the first floor for the x-ray testing  Please go to the LAB at the blood drawing area for the tests to be done  You will be contacted by phone if any changes need to be made immediately.  Otherwise, you will receive a letter about your results with an explanation, but please check with MyChart first.  Please make an Appointment to return in 6 months, or sooner if needed

## 2023-03-29 ENCOUNTER — Encounter: Payer: Self-pay | Admitting: Internal Medicine

## 2023-03-29 ENCOUNTER — Other Ambulatory Visit: Payer: Self-pay | Admitting: Internal Medicine

## 2023-03-29 MED ORDER — ROSUVASTATIN CALCIUM 40 MG PO TABS: 40.0000 mg | ORAL_TABLET | Freq: Every day | ORAL | 3 refills | Status: DC

## 2023-03-31 DIAGNOSIS — R011 Cardiac murmur, unspecified: Secondary | ICD-10-CM | POA: Insufficient documentation

## 2023-03-31 NOTE — Assessment & Plan Note (Signed)
Last vitamin D Lab Results  Component Value Date   VD25OH 20.96 (L) 03/27/2023   Low, to start oral replacement

## 2023-03-31 NOTE — Assessment & Plan Note (Addendum)
Age and sex appropriate education and counseling updated with regular exercise and diet Referrals for preventative services - none needed Immunizations addressed - for shingrix at pharmacy, for flu shot and prevnar 20 today Smoking counseling  - none needed Evidence for depression or other mood disorder - none significant Most recent labs reviewed. I have personally reviewed and have noted: 1) the patient's medical and social history 2) The patient's current medications and supplements 3) The patient's height, weight, and BMI have been recorded in the chart

## 2023-03-31 NOTE — Assessment & Plan Note (Signed)
Echo benign, also for cxr today

## 2023-03-31 NOTE — Assessment & Plan Note (Signed)
Lab Results  Component Value Date   LDLCALC 120 (H) 03/27/2023   Uncontrolled, pt to restart crestor 40 qd

## 2023-03-31 NOTE — Assessment & Plan Note (Signed)
Lab Results  Component Value Date   VITAMINB12 882 03/27/2023   Stable, cont oral replacement - b12 1000 mcg qd

## 2023-03-31 NOTE — Assessment & Plan Note (Signed)
Lab Results  Component Value Date   TSH 1.72 03/27/2023   Stable, pt does not require med at this time

## 2023-03-31 NOTE — Assessment & Plan Note (Signed)
Lab Results  Component Value Date   HGBA1C 6.2 03/27/2023   Stable, pt to continue current medical treatment actos 15 qd

## 2023-03-31 NOTE — Assessment & Plan Note (Signed)
BP Readings from Last 3 Encounters:  03/27/23 138/84  12/20/22 (!) 154/81  12/12/22 (!) 146/66   Stable, pt to continue medical treatment norvasc 10 every day, losartan 25 qd

## 2023-04-01 ENCOUNTER — Other Ambulatory Visit: Payer: Self-pay | Admitting: Dermatology

## 2023-04-01 DIAGNOSIS — M793 Panniculitis, unspecified: Secondary | ICD-10-CM

## 2023-04-04 ENCOUNTER — Ambulatory Visit: Payer: 59 | Admitting: Dermatology

## 2023-04-04 ENCOUNTER — Encounter: Payer: Self-pay | Admitting: Dermatology

## 2023-04-04 VITALS — BP 140/87 | HR 78

## 2023-04-04 DIAGNOSIS — L821 Other seborrheic keratosis: Secondary | ICD-10-CM

## 2023-04-04 DIAGNOSIS — M793 Panniculitis, unspecified: Secondary | ICD-10-CM

## 2023-04-04 MED ORDER — TACROLIMUS 0.1 % EX OINT: TOPICAL_OINTMENT | Freq: Two times a day (BID) | CUTANEOUS | 3 refills | Status: DC

## 2023-04-04 NOTE — Patient Instructions (Addendum)
 Hello Brandi Shaffer,  Thank you for visiting us  today.   Here is a summary of the key instructions from today's consultation:  Medications and Usage:   Triamcinolone  Cream: Stop using this every day.  Continue using for 2 weeks as needed for flare-ups. Remember to take breaks to prevent skin thinning.   Tacrolimus  Ointment: Switch to this ointment for daily use to manage inflammation without the side effects associated with skin thinning. Apply a thin layer as demonstrated.   Pentoxifylline  Pills: Discontinue unless symptoms of tight skin and pain return. If symptoms reappear, resume the medication and contact our office for a refill.  Compression Stockings:   Continue regular use, especially in air-conditioned environments during warmer months.  Follow-Up:   No immediate follow-up appointment is necessary. We will see you in 8 months unless you experience any emergencies or worsening of symptoms.  General Advice:   The lesion on your abdomen has been identified as a seborrheic keratosis, which is benign and requires no treatment.  Thank you once again for your commitment to your health. Should you have any questions or concerns before our next scheduled visit, please do not hesitate to contact the office.  Warm regards,  Dr. Delon Lenis,  Dermatology     Important Information   Due to recent changes in healthcare laws, you may see results of your pathology and/or laboratory studies on MyChart before the doctors have had a chance to review them. We understand that in some cases there may be results that are confusing or concerning to you. Please understand that not all results are received at the same time and often the doctors may need to interpret multiple results in order to provide you with the best plan of care or course of treatment. Therefore, we ask that you please give us  2 business days to thoroughly review all your results before contacting the office for clarification.  Should we see a critical lab result, you will be contacted sooner.     If You Need Anything After Your Visit   If you have any questions or concerns for your doctor, please call our main line at [phone]. If no one answers, please leave a voicemail as directed and we will return your call as soon as possible. Messages left after 4 pm will be answered the following business day.    You may also send us  a message via MyChart. We typically respond to MyChart messages within 1-2 business days.  For prescription refills, please ask your pharmacy to contact our office. Our fax number is [phone].  If you have an urgent issue when the clinic is closed that cannot wait until the next business day, you can page your doctor at the number below.     Please note that while we do our best to be available for urgent issues outside of office hours, we are not available 24/7.    If you have an urgent issue and are unable to reach us , you may choose to seek medical care at your doctor's office, retail clinic, urgent care center, or emergency room.   If you have a medical emergency, please immediately call 911 or go to the emergency department. In the event of inclement weather, please call our main line at [phone] for an update on the status of any delays or closures.  Dermatology Medication Tips: Please keep the boxes that topical medications come in in order to help keep track of the instructions about where and how to use  these. Pharmacies typically print the medication instructions only on the boxes and not directly on the medication tubes.   If your medication is too expensive, please contact our office at [phone] or send us  a message through MyChart.    We are unable to tell what your co-pay for medications will be in advance as this is different depending on your insurance coverage. However, we may be able to find a substitute medication at lower cost or fill out paperwork to get  insurance to cover a needed medication.    If a prior authorization is required to get your medication covered by your insurance company, please allow us  1-2 business days to complete this process.   Drug prices often vary depending on where the prescription is filled and some pharmacies may offer cheaper prices.   The website www.goodrx.com contains coupons for medications through different pharmacies. The prices here do not account for what the cost may be with help from insurance (it may be cheaper with your insurance), but the website can give you the price if you did not use any insurance.  - You can print the associated coupon and take it with your prescription to the pharmacy.  - You may also stop by our office during regular business hours and pick up a GoodRx coupon card.  - If you need your prescription sent electronically to a different pharmacy, notify our office through [facility] or by phone at [phone]

## 2023-04-04 NOTE — Progress Notes (Signed)
   Follow-Up Visit   Subjective  Brandi Shaffer is a 78 y.o. female who presents for the following: Lipodermatosclerosis   Patient present today for follow up visit for Lipodermatosclerosis. Patient was last evaluated on 01/03/23. At this visit pt was prescribed triamcinolone  cream, to be applied twice daily for two weeks, followed by a break and the use of CeraVe with Urea as a moisturizer & pentoxifylline . She was also recommended to wear compression hose daily. Patient reports sxs are  Improving . Patient denies medication changes.  The following portions of the chart were reviewed this encounter and updated as appropriate: medications, allergies, medical history  Review of Systems:  No other skin or systemic complaints except as noted in HPI or Assessment and Plan.  Objective  Well appearing patient in no apparent distress; mood and affect are within normal limits.  A focused examination was performed of the following areas: B/L Lower Extremities  Relevant exam findings are noted in the Assessment and Plan.              Assessment & Plan    Lipodermatosclerosis Exam: Inverted champagne shaped, with no tenderness and improved swelling,  Dorsal pedal pulse LLE 2+  Assessment: Significant improvement in the patient's lipodermatosclerosis. Notable reduction in tightness and pigmentation on the lower half of the left lower extremity. Pain and tenderness upon palpation have resolved. The condition is chronic. Examination reveals left foot dorsal pedis pulse at 2+, warm feet, and intact sensation, indicating good blood flow.   Treatment Plan: - Recommended stopping pentoxifylline  - Recommended continuing Compression Hose - We will prescribe Tacrolimus  Ointment to apply to the effected areas daily - Recommended if sxs return to restart the [facility] application - We will plan to follow up in 8 months to reassess  SEBORRHEIC KERATOSIS - Stuck-on, waxy, tan-brown papules and/or plaques   - Benign-appearing - Discussed benign etiology and prognosis. - Observe - Call for any changes LIPODERMATOSCLEROSIS   Related Medications triamcinolone  cream (KENALOG ) 0.1 % Apply 1 Application topically daily. (JAR)  Return in about 8 months (around 12/02/2023) for Lipodermatosclerosis F/U.  Documentation: I have reviewed the above documentation for accuracy and completeness, and I agree with the above.  I, Jetta Ager, am acting as scribe for Delon Lenis, DO.  Delon Lenis, DO

## 2023-04-06 ENCOUNTER — Encounter: Payer: Self-pay | Admitting: Internal Medicine

## 2023-05-06 ENCOUNTER — Other Ambulatory Visit: Payer: Self-pay

## 2023-05-06 ENCOUNTER — Other Ambulatory Visit: Payer: Self-pay | Admitting: Dermatology

## 2023-05-06 DIAGNOSIS — M793 Panniculitis, unspecified: Secondary | ICD-10-CM

## 2023-05-06 MED ORDER — TRIAMCINOLONE ACETONIDE 0.1 % EX CREA: 1.0000 | TOPICAL_CREAM | Freq: Every day | CUTANEOUS | 0 refills | Status: DC

## 2023-05-06 NOTE — Progress Notes (Signed)
 Refill request

## 2023-05-15 ENCOUNTER — Ambulatory Visit
Admission: RE | Admit: 2023-05-15 | Discharge: 2023-05-15 | Disposition: A | Discharge status: Home / Self Care | Payer: 59 | Source: Ambulatory Visit | Attending: Internal Medicine | Admitting: Internal Medicine

## 2023-05-15 DIAGNOSIS — Z1231 Encounter for screening mammogram for malignant neoplasm of breast: Secondary | ICD-10-CM

## 2023-06-07 ENCOUNTER — Other Ambulatory Visit: Payer: Self-pay | Admitting: Internal Medicine

## 2023-06-07 ENCOUNTER — Other Ambulatory Visit: Payer: Self-pay

## 2023-06-13 ENCOUNTER — Other Ambulatory Visit: Payer: Self-pay

## 2023-06-13 ENCOUNTER — Other Ambulatory Visit: Payer: Self-pay | Admitting: Internal Medicine

## 2023-06-21 ENCOUNTER — Ambulatory Visit
Admission: EM | Admit: 2023-06-21 | Discharge: 2023-06-21 | Disposition: A | Discharge status: Home / Self Care | Attending: Family Medicine | Admitting: Family Medicine

## 2023-06-21 DIAGNOSIS — K047 Periapical abscess without sinus: Secondary | ICD-10-CM

## 2023-06-21 MED ORDER — FLUCONAZOLE 150 MG PO TABS: 150.0000 mg | ORAL_TABLET | ORAL | 0 refills | Status: AC

## 2023-06-21 MED ORDER — AMOXICILLIN 875 MG PO TABS: 875.0000 mg | ORAL_TABLET | Freq: Two times a day (BID) | ORAL | 0 refills | Status: DC

## 2023-06-21 NOTE — ED Triage Notes (Signed)
 Pt c/o neck tenderness starting this afternoon.No home intervention.

## 2023-06-21 NOTE — Discharge Instructions (Addendum)
 Start amoxicillin  for a dental infection. Follow up with your dental specialist as soon as possible. Use fluconazole  for yeast infection from antibiotics.

## 2023-06-21 NOTE — ED Provider Notes (Signed)
 [facility]  Note:  This document was prepared using Conservation officer, historic buildings and may include unintentional dictation errors.  MRN: [id number] DOB: 1945/11/21  Subjective:   Brandi Shaffer is a 78 y.o. female presenting for acute onset today of left lower jaw pain.  Patient has a history of dental infections and reports that she is concerned this is recurring for her.  Feels a knot on the underside of her jaw.  No fever, drainage or pus or bleeding.  No chest pain, heart racing, history of MI.  She reports that she has to use a metallic hardware for her teeth but avoids it for the most part.  Feels that every time she uses it she gets an infection and she definitely use that earlier this week.  No current facility-administered medications for this encounter.  Current Outpatient Medications:    amLODipine  (NORVASC ) 10 MG tablet, TAKE 1 TABLET BY MOUTH EVERY DAY, Disp: 90 tablet, Rfl: 3   albuterol  (VENTOLIN  HFA) 108 (90 Base) MCG/ACT inhaler, TAKE 2 PUFFS BY MOUTH EVERY 6 HOURS AS NEEDED FOR WHEEZE, Disp: 18 each, Rfl: 5   ammonium lactate  (LAC-HYDRIN ) 12 % lotion, APPLY 1 APPLICATION TOPICALLY AS NEEDED FOR DRY SKIN., Disp: 400 mL, Rfl: 5   APPLE CIDER VINEGAR PO, Take by mouth., Disp: , Rfl:    aspirin  81 MG EC tablet, TAKE 1 TABLET BY MOUTH DAILY. SWALLOW WHOLE., Disp: 30 tablet, Rfl: 11   chlorhexidine  (PERIDEX ) 0.12 % solution, USE AS DIRECTED, Disp: 473 mL, Rfl: 11   Cholecalciferol  50 MCG (2000 UT) TABS, 1 tab by mouth once daily, Disp: 30 tablet, Rfl: 99   clotrimazole  (LOTRIMIN ) 1 % cream, Apply to affected feet and between toes twice daily for 6 weeks for athlete's feet, Disp: 60 g, Rfl: 1   cyclobenzaprine  (FLEXERIL ) 5 MG tablet, Take 1 tablet (5 mg total) by mouth 3 (three) times daily as needed for muscle spasms., Disp: 30 tablet, Rfl: 1   fluconazole  (DIFLUCAN ) 150 MG tablet, Take 1 tablet (150 mg total) by mouth every three (3) days as needed.,  Disp: 2 tablet, Rfl: 1   furosemide  (LASIX ) 20 MG tablet, Take 1 tablet (20 mg total) by mouth daily as needed., Disp: 90 tablet, Rfl: 3   gabapentin  (NEURONTIN ) 100 MG capsule, Take 1 capsule (100 mg total) by mouth 3 (three) times daily., Disp: 90 capsule, Rfl: 3   losartan  (COZAAR ) 25 MG tablet, TAKE 1 TABLET (25 MG TOTAL) BY MOUTH DAILY., Disp: 90 tablet, Rfl: 3   Misc Natural Products (CRANBERRY/PROBIOTIC PO), Take by mouth., Disp: , Rfl:    Multiple Vitamins-Minerals (WOMENS MULTIVITAMIN PO), Take 1 tablet by mouth daily., Disp: , Rfl:    OneTouch Delica Lancets 33G MISC, 1 each by Does not apply route 2 (two) times daily. Use to check blood sugars twice a day Dx E11.9, Disp: 200 each, Rfl: 11   ONETOUCH VERIO test strip, USE 2 (TWO) TIMES DAILY. DX E11.9, Disp: 100 strip, Rfl: 4   pioglitazone  (ACTOS ) 15 MG tablet, TAKE 1 TABLET BY MOUTH EVERY DAY, Disp: 90 tablet, Rfl: 3   potassium chloride  (KLOR-CON ) 10 MEQ tablet, TAKE 1 TABLET BY MOUTH EVERY DAY, Disp: 90 tablet, Rfl: 3   predniSONE  (DELTASONE ) 20 MG tablet, Take 2 pills for 3 days, 1 pill for 4 days, Disp: 10 tablet, Rfl: 0   rosuvastatin  (CRESTOR ) 40 MG tablet, Take 1 tablet (40 mg total) by mouth daily., Disp: 90 tablet,  Rfl: 3   sulfamethoxazole -trimethoprim  (BACTRIM  DS) 800-160 MG tablet, Take 1 tablet by mouth 2 (two) times daily., Disp: 14 tablet, Rfl: 0   tacrolimus  (PROTOPIC ) 0.1 % ointment, Apply topically 2 (two) times daily., Disp: 100 g, Rfl: 3   triamcinolone  cream (KENALOG ) 0.1 %, Apply 1 Application topically daily. (JAR), Disp: 453.6 g, Rfl: 0   vitamin B-12 (CYANOCOBALAMIN ) 1000 MCG tablet, 1 tab by mouth mon - wed - thur, Disp: 90 tablet, Rfl: 3   Allergies  Allergen Reactions   Januvia [Sitagliptin Phosphate]     dizziness   Lipitor [Atorvastatin ] Other (See Comments)    Myalgia    Metformin  And Related Nausea Only   Doxycycline  Rash    Past Medical History:  Diagnosis Date   ANEMIA-IRON DEFICIENCY  12/06/2009   Anxiety state 07/01/2015   Asthma 12/05/2011   DIABETES MELLITUS, TYPE II 09/17/2006   DIVERTICULOSIS, COLON 12/06/2009   GENITAL HERPES 03/24/2010   GERD (gastroesophageal reflux disease) 06/28/2011   HYPERLIPIDEMIA 03/24/2010   HYPERTENSION 09/17/2006   HYPOTHYROIDISM 09/17/2006   LIPOMA 12/06/2009   PERIPHERAL EDEMA 03/24/2010   Renal cyst 07/05/2011   1.9 cm minimally complex     Past Surgical History:  Procedure Laterality Date   ABDOMINAL HYSTERECTOMY     fibroids   ankle surgury     x 3 left - chronic pain/swelling   COLONOSCOPY  2011   SHOULDER SURGERY Left    TONSILLECTOMY     TUBAL LIGATION      Family History  Problem Relation Age of Onset   Hypertension Mother    Hypertension Father    Heart disease Sister        sudden death early 37's   Stroke Sister    Breast cancer Sister 6   Prostate cancer Brother    Diabetes Other    Joint hypermobility Other        DJD   Colon cancer Neg Hx    Colon polyps Neg Hx    Esophageal cancer Neg Hx    Stomach cancer Neg Hx    Rectal cancer Neg Hx     Social History   Tobacco Use   Smoking status: Never    Passive exposure: Never   Smokeless tobacco: Never  Vaping Use   Vaping status: Never Used  Substance Use Topics   Alcohol use: No    Alcohol/week: 0.0 standard drinks of alcohol   Drug use: No    ROS   Objective:   Vitals: BP (!) 170/83 (BP Location: Right Arm)   Pulse 84   Temp 98 F (36.7 C) (Oral)   Resp 20   SpO2 98%   Physical Exam Constitutional:      General: She is not in acute distress.    Appearance: Normal appearance. She is well-developed. She is not ill-appearing, toxic-appearing or diaphoretic.  HENT:     Head: Normocephalic and atraumatic.      Nose: Nose normal.     Mouth/Throat:     Mouth: Mucous membranes are moist.   Eyes:     General: No scleral icterus.       Right eye: No discharge.        Left eye: No discharge.     Extraocular Movements: Extraocular  movements intact.  Cardiovascular:     Rate and Rhythm: Normal rate.  Pulmonary:     Effort: Pulmonary effort is normal.  Skin:    General: Skin is warm and dry.  Neurological:  General: No focal deficit present.     Mental Status: She is alert and oriented to person, place, and time.  Psychiatric:        Mood and Affect: Mood normal.        Behavior: Behavior normal.      Assessment and Plan :   PDMP not reviewed this encounter.  1. Dental infection    Start amoxicillin  for dental infection/abscess, use Tylenol  for pain. Emphasized need for follow-up with her dental specialist. Counseled patient on potential for adverse effects with medications prescribed/recommended today, strict ER and return-to-clinic precautions discussed, patient verbalized understanding.    Adolph Hoop, [place] 06/21/23 1757

## 2023-07-04 DIAGNOSIS — H25813 Combined forms of age-related cataract, bilateral: Secondary | ICD-10-CM | POA: Diagnosis not present

## 2023-07-04 DIAGNOSIS — E119 Type 2 diabetes mellitus without complications: Secondary | ICD-10-CM | POA: Diagnosis not present

## 2023-07-04 LAB — HM DIABETES EYE EXAM

## 2023-08-20 ENCOUNTER — Ambulatory Visit (INDEPENDENT_AMBULATORY_CARE_PROVIDER_SITE_OTHER): Admitting: Internal Medicine

## 2023-08-20 ENCOUNTER — Ambulatory Visit: Payer: Self-pay | Admitting: Internal Medicine

## 2023-08-20 ENCOUNTER — Ambulatory Visit: Payer: Self-pay

## 2023-08-20 ENCOUNTER — Encounter: Payer: Self-pay | Admitting: Internal Medicine

## 2023-08-20 VITALS — BP 126/78 | HR 83 | Temp 98.3°F | Ht 59.0 in | Wt 187.0 lb

## 2023-08-20 DIAGNOSIS — E7800 Pure hypercholesterolemia, unspecified: Secondary | ICD-10-CM | POA: Diagnosis not present

## 2023-08-20 DIAGNOSIS — I10 Essential (primary) hypertension: Secondary | ICD-10-CM

## 2023-08-20 DIAGNOSIS — E1165 Type 2 diabetes mellitus with hyperglycemia: Secondary | ICD-10-CM

## 2023-08-20 DIAGNOSIS — E039 Hypothyroidism, unspecified: Secondary | ICD-10-CM | POA: Diagnosis not present

## 2023-08-20 DIAGNOSIS — E538 Deficiency of other specified B group vitamins: Secondary | ICD-10-CM | POA: Diagnosis not present

## 2023-08-20 DIAGNOSIS — Z7984 Long term (current) use of oral hypoglycemic drugs: Secondary | ICD-10-CM

## 2023-08-20 DIAGNOSIS — J069 Acute upper respiratory infection, unspecified: Secondary | ICD-10-CM | POA: Diagnosis not present

## 2023-08-20 DIAGNOSIS — E559 Vitamin D deficiency, unspecified: Secondary | ICD-10-CM | POA: Diagnosis not present

## 2023-08-20 LAB — CBC WITH DIFFERENTIAL/PLATELET
Basophils Absolute: 0 10*3/uL (ref 0.0–0.1)
Basophils Relative: 0.5 % (ref 0.0–3.0)
Eosinophils Absolute: 0.2 10*3/uL (ref 0.0–0.7)
Eosinophils Relative: 2.5 % (ref 0.0–5.0)
HCT: 40.5 % (ref 36.0–46.0)
Hemoglobin: 13.5 g/dL (ref 12.0–15.0)
Lymphocytes Relative: 46.2 % — ABNORMAL HIGH (ref 12.0–46.0)
Lymphs Abs: 3 10*3/uL (ref 0.7–4.0)
MCHC: 33.3 g/dL (ref 30.0–36.0)
MCV: 92.2 fl (ref 78.0–100.0)
Monocytes Absolute: 0.6 10*3/uL (ref 0.1–1.0)
Monocytes Relative: 8.6 % (ref 3.0–12.0)
Neutro Abs: 2.7 10*3/uL (ref 1.4–7.7)
Neutrophils Relative %: 42.2 % — ABNORMAL LOW (ref 43.0–77.0)
Platelets: 257 10*3/uL (ref 150.0–400.0)
RBC: 4.39 Mil/uL (ref 3.87–5.11)
RDW: 14.5 % (ref 11.5–15.5)
WBC: 6.5 10*3/uL (ref 4.0–10.5)

## 2023-08-20 LAB — VITAMIN D 25 HYDROXY (VIT D DEFICIENCY, FRACTURES): VITD: 25.08 ng/mL — ABNORMAL LOW (ref 30.00–100.00)

## 2023-08-20 LAB — URINALYSIS, ROUTINE W REFLEX MICROSCOPIC
Bilirubin Urine: NEGATIVE
Hgb urine dipstick: NEGATIVE
Ketones, ur: NEGATIVE
Leukocytes,Ua: NEGATIVE
Nitrite: NEGATIVE
Specific Gravity, Urine: 1.01 (ref 1.000–1.030)
Total Protein, Urine: NEGATIVE
Urine Glucose: NEGATIVE
Urobilinogen, UA: 0.2 (ref 0.0–1.0)
WBC, UA: NONE SEEN (ref 0–?)
pH: 7 (ref 5.0–8.0)

## 2023-08-20 LAB — LIPID PANEL
Cholesterol: 190 mg/dL (ref 0–200)
HDL: 64.2 mg/dL (ref >39.00)
LDL Cholesterol: 108 mg/dL — ABNORMAL HIGH (ref 0–99)
NonHDL: 125.5
Total CHOL/HDL Ratio: 3
Triglycerides: 89 mg/dL (ref 0.0–149.0)
VLDL: 17.8 mg/dL (ref 0.0–40.0)

## 2023-08-20 LAB — BASIC METABOLIC PANEL WITH GFR
BUN: 16 mg/dL (ref 6–23)
CO2: 30 meq/L (ref 19–32)
Calcium: 9.5 mg/dL (ref 8.4–10.5)
Chloride: 106 meq/L (ref 96–112)
Creatinine, Ser: 0.95 mg/dL (ref 0.40–1.20)
GFR: 57.32 mL/min — ABNORMAL LOW (ref >60.00)
Glucose, Bld: 87 mg/dL (ref 70–99)
Potassium: 3.8 meq/L (ref 3.5–5.1)
Sodium: 142 meq/L (ref 135–145)

## 2023-08-20 LAB — VITAMIN B12: Vitamin B-12: 1099 pg/mL — ABNORMAL HIGH (ref 211–911)

## 2023-08-20 LAB — MICROALBUMIN / CREATININE URINE RATIO
Creatinine,U: 55 mg/dL
Microalb Creat Ratio: 17.7 mg/g (ref 0.0–30.0)
Microalb, Ur: 1 mg/dL (ref 0.0–1.9)

## 2023-08-20 LAB — HEMOGLOBIN A1C: Hgb A1c MFr Bld: 6 % (ref 4.6–6.5)

## 2023-08-20 LAB — HEPATIC FUNCTION PANEL
ALT: 14 U/L (ref 0–35)
AST: 24 U/L (ref 0–37)
Albumin: 4.5 g/dL (ref 3.5–5.2)
Alkaline Phosphatase: 107 U/L (ref 39–117)
Bilirubin, Direct: 0.1 mg/dL (ref 0.0–0.3)
Total Bilirubin: 0.5 mg/dL (ref 0.2–1.2)
Total Protein: 7.4 g/dL (ref 6.0–8.3)

## 2023-08-20 LAB — TSH: TSH: 2.12 u[IU]/mL (ref 0.35–5.50)

## 2023-08-20 MED ORDER — MECLIZINE HCL 12.5 MG PO TABS: 12.5000 mg | ORAL_TABLET | Freq: Three times a day (TID) | ORAL | 1 refills | Status: DC | PRN

## 2023-08-20 MED ORDER — AMOXICILLIN-POT CLAVULANATE 875-125 MG PO TABS: 1.0000 | ORAL_TABLET | Freq: Two times a day (BID) | ORAL | 0 refills | Status: DC

## 2023-08-20 NOTE — Patient Instructions (Signed)
 Please take all new medication as prescribed- the antibiotic, and meclizine  as needed for vertigo  Please continue all other medications as before, and refills have been done if requested.  Please have the pharmacy call with any other refills you may need.  Please keep your appointments with your specialists as you may have planned  Please go to the LAB at the blood drawing area for the tests to be done  You will be contacted by phone if any changes need to be made immediately.  Otherwise, you will receive a letter about your results with an explanation, but please check with MyChart first.  Please make an Appointment to return after Feb 27 2024, or sooner if needed

## 2023-08-20 NOTE — Progress Notes (Unsigned)
 Patient ID: Brandi Shaffer, female   DOB: [birth year], 77 y.o.   MRN: [id number]        Chief Complaint: follow up HTN, HLD and hyperglycemia ***       HPI:  Brandi Shaffer is a 78 y.o. female here with c/o        Wt Readings from Last 3 Encounters:  08/20/23 187 lb (84.8 kg)  03/27/23 183 lb (83 kg)  03/19/23 186 lb (84.4 kg)   BP Readings from Last 3 Encounters:  08/20/23 126/78  06/21/23 (!) 170/83  04/04/23 (!) 140/87         Past Medical History:  Diagnosis Date   ANEMIA-IRON DEFICIENCY 12/06/2009   Anxiety state 07/01/2015   Asthma 12/05/2011   DIABETES MELLITUS, TYPE II 09/17/2006   DIVERTICULOSIS, COLON 12/06/2009   GENITAL HERPES 03/24/2010   GERD (gastroesophageal reflux disease) 06/28/2011   HYPERLIPIDEMIA 03/24/2010   HYPERTENSION 09/17/2006   HYPOTHYROIDISM 09/17/2006   LIPOMA 12/06/2009   PERIPHERAL EDEMA 03/24/2010   Renal cyst 07/05/2011   1.9 cm minimally complex   Past Surgical History:  Procedure Laterality Date   ABDOMINAL HYSTERECTOMY     fibroids   ankle surgury     x 3 left - chronic pain/swelling   COLONOSCOPY  2011   SHOULDER SURGERY Left    TONSILLECTOMY     TUBAL LIGATION      reports that she has never smoked. She has never been exposed to tobacco smoke. She has never used smokeless tobacco. She reports that she does not drink alcohol and does not use drugs. family history includes Breast cancer (age of onset: 33) in her sister; Diabetes in an other family member; Heart disease in her sister; Hypertension in her father and mother; Joint hypermobility in an other family member; Prostate cancer in her brother; Stroke in her sister. Allergies  Allergen Reactions   Januvia [Sitagliptin Phosphate]     dizziness   Lipitor [Atorvastatin ] Other (See Comments)    Myalgia    Metformin  And Related Nausea Only   Doxycycline  Rash   Current Outpatient Medications on File Prior to Visit  Medication Sig Dispense Refill   albuterol  (VENTOLIN  HFA) 108 (90 Base)  MCG/ACT inhaler TAKE 2 PUFFS BY MOUTH EVERY 6 HOURS AS NEEDED FOR WHEEZE 18 each 5   amLODipine  (NORVASC ) 10 MG tablet TAKE 1 TABLET BY MOUTH EVERY DAY 90 tablet 3   ammonium lactate  (LAC-HYDRIN ) 12 % lotion APPLY 1 APPLICATION TOPICALLY AS NEEDED FOR DRY SKIN. 400 mL 5   amoxicillin  (AMOXIL ) 875 MG tablet Take 1 tablet (875 mg total) by mouth 2 (two) times daily. 14 tablet 0   APPLE CIDER VINEGAR PO Take by mouth.     aspirin  81 MG EC tablet TAKE 1 TABLET BY MOUTH DAILY. SWALLOW WHOLE. 30 tablet 11   chlorhexidine  (PERIDEX ) 0.12 % solution USE AS DIRECTED 473 mL 11   Cholecalciferol  50 MCG (2000 UT) TABS 1 tab by mouth once daily 30 tablet 99   clotrimazole  (LOTRIMIN ) 1 % cream Apply to affected feet and between toes twice daily for 6 weeks for athlete's feet 60 g 1   cyclobenzaprine  (FLEXERIL ) 5 MG tablet Take 1 tablet (5 mg total) by mouth 3 (three) times daily as needed for muscle spasms. 30 tablet 1   fluconazole  (DIFLUCAN ) 150 MG tablet Take 1 tablet (150 mg total) by mouth once a week. 2 tablet 0   furosemide  (LASIX ) 20 MG tablet Take 1 tablet (20  mg total) by mouth daily as needed. 90 tablet 3   gabapentin  (NEURONTIN ) 100 MG capsule Take 1 capsule (100 mg total) by mouth 3 (three) times daily. 90 capsule 3   losartan  (COZAAR ) 25 MG tablet TAKE 1 TABLET (25 MG TOTAL) BY MOUTH DAILY. 90 tablet 3   Misc Natural Products (CRANBERRY/PROBIOTIC PO) Take by mouth.     Multiple Vitamins-Minerals (WOMENS MULTIVITAMIN PO) Take 1 tablet by mouth daily.     OneTouch Delica Lancets 33G MISC 1 each by Does not apply route 2 (two) times daily. Use to check blood sugars twice a day Dx E11.9 200 each 11   ONETOUCH VERIO test strip USE 2 (TWO) TIMES DAILY. DX E11.9 100 strip 4   pioglitazone  (ACTOS ) 15 MG tablet TAKE 1 TABLET BY MOUTH EVERY DAY 90 tablet 3   potassium chloride  (KLOR-CON ) 10 MEQ tablet TAKE 1 TABLET BY MOUTH EVERY DAY 90 tablet 3   predniSONE  (DELTASONE ) 20 MG tablet Take 2 pills for 3 days,  1 pill for 4 days 10 tablet 0   rosuvastatin  (CRESTOR ) 40 MG tablet Take 1 tablet (40 mg total) by mouth daily. 90 tablet 3   sulfamethoxazole -trimethoprim  (BACTRIM  DS) 800-160 MG tablet Take 1 tablet by mouth 2 (two) times daily. 14 tablet 0   tacrolimus  (PROTOPIC ) 0.1 % ointment Apply topically 2 (two) times daily. 100 g 3   triamcinolone  cream (KENALOG ) 0.1 % Apply 1 Application topically daily. (JAR) 453.6 g 0   vitamin B-12 (CYANOCOBALAMIN ) 1000 MCG tablet 1 tab by mouth mon - wed - thur 90 tablet 3   No current facility-administered medications on file prior to visit.        ROS:  All others reviewed and negative.  Objective        PE:  BP 126/78 (BP Location: Right Arm, Patient Position: Sitting, Cuff Size: Normal)   Pulse 83   Temp 98.3 F (36.8 C) (Oral)   Ht 4' 11 (1.499 m)   Wt 187 lb (84.8 kg)   SpO2 99%   BMI 37.77 kg/m                 Constitutional: Pt appears in NAD               HENT: Head: NCAT.                Right Ear: External ear normal.                 Left Ear: External ear normal.                Eyes: . Pupils are equal, round, and reactive to light. Conjunctivae and EOM are normal               Nose: without d/c or deformity               Neck: Neck supple. Gross normal ROM               Cardiovascular: Normal rate and regular rhythm.                 Pulmonary/Chest: Effort normal and breath sounds without rales or wheezing.                Abd:  Soft, NT, ND, + BS, no organomegaly               Neurological: Pt is alert. At baseline orientation, motor grossly intact  Skin: Skin is warm. No rashes, no other new lesions, LE edema - ***               Psychiatric: Pt behavior is normal without agitation   Micro: none  Cardiac tracings I have personally interpreted today:  none  Pertinent Radiological findings (summarize): none   Lab Results  Component Value Date   WBC 5.4 03/27/2023   HGB 13.9 03/27/2023   HCT 42.2 03/27/2023   PLT  265.0 03/27/2023   GLUCOSE 83 03/27/2023   CHOL 196 03/27/2023   TRIG 76.0 03/27/2023   HDL 60.00 03/27/2023   LDLDIRECT 134.6 05/23/2010   LDLCALC 120 (H) 03/27/2023   ALT 12 03/27/2023   AST 20 03/27/2023   NA 142 03/27/2023   K 3.6 03/27/2023   CL 106 03/27/2023   CREATININE 0.87 03/27/2023   BUN 18 03/27/2023   CO2 30 03/27/2023   TSH 1.72 03/27/2023   HGBA1C 6.2 03/27/2023   MICROALBUR 0.8 03/27/2023   Assessment/Plan:  Brandi Shaffer is a 78 y.o. Black or African American [2] female with  has a past medical history of ANEMIA-IRON DEFICIENCY (12/06/2009), Anxiety state (07/01/2015), Asthma (12/05/2011), DIABETES MELLITUS, TYPE II (09/17/2006), DIVERTICULOSIS, COLON (12/06/2009), GENITAL HERPES (03/24/2010), GERD (gastroesophageal reflux disease) (06/28/2011), HYPERLIPIDEMIA (03/24/2010), HYPERTENSION (09/17/2006), HYPOTHYROIDISM (09/17/2006), LIPOMA (12/06/2009), PERIPHERAL EDEMA (03/24/2010), and Renal cyst (07/05/2011).  No problem-specific Assessment & Plan notes found for this encounter.  Followup: No follow-ups on file.  Lynwood Rush, MD 08/20/2023 1:57 PM [facility]

## 2023-08-20 NOTE — Telephone Encounter (Signed)
 Patient was disconnected from Nurse triage and called back-appointment scheduled for this afternoon with PCP at 1:20 PM

## 2023-08-20 NOTE — Telephone Encounter (Addendum)
 Disconnected from patient due to call drop. Patient was assisted with scheduling with another E2C2 Nurse.    Copied from CRM [id number]. Topic: Clinical - Red Word Triage >> Aug 20, 2023  9:01 AM Aleatha C wrote: Red Word that prompted transfer to Nurse Triage:Patient is having  Dizzy spells patient has diabetes type 2 and she has hypertension Answer Assessment - Initial Assessment Questions Diabetic - glucose levels are good. This morning it was 101. Yesterday morning when she woke up it was 81.  BP - not missing any BP meds. BP this morning was 140s/90s.    1. DESCRIPTION: Describe your dizziness.     Room was spinning 2. LIGHTHEADED: Do you feel lightheaded? (e.g., somewhat faint, woozy, weak upon standing)     Patient felt room was spinning 3. VERTIGO: Do you feel like either you or the room is spinning or tilting? (i.e. vertigo)     Room is spinning 4. SEVERITY: How bad is it?  Do you feel like you are going to faint? Can you stand and walk?   - MILD: Feels slightly dizzy, but walking normally.   - MODERATE: Feels unsteady when walking, but not falling; interferes with normal activities (e.g., school, work).   - SEVERE: Unable to walk without falling, or requires assistance to walk without falling; feels like passing out now.      Mildly dizzy today 5. ONSET:  When did the dizziness begin?     Yesterday morning 6. AGGRAVATING FACTORS: Does anything make it worse? (e.g., standing, change in head position)     Patient states she hasn't done a lot so doesn't know aggravating factors 8. CAUSE: What do you think is causing the dizziness?     Patient wonders if she is dehydrated 10. OTHER SYMPTOMS: Do you have any other symptoms? (e.g., fever, chest pain, vomiting, diarrhea, bleeding)       Patient states ears are itchy, states right shoulder is bothering her more than normal  Denies: vomiting, diarrhea, chest pain, numbness/weakness  Protocols used: Dizziness -  Lightheadedness-A-AH

## 2023-08-21 ENCOUNTER — Encounter: Payer: Self-pay | Admitting: Internal Medicine

## 2023-08-21 NOTE — Assessment & Plan Note (Signed)
 BP Readings from Last 3 Encounters:  08/20/23 126/78  06/21/23 (!) 170/83  04/04/23 (!) 140/87   Stable, pt to continue medical treatment norvasc  10 every day, losartan  25 qd

## 2023-08-21 NOTE — Assessment & Plan Note (Signed)
 Lab Results  Component Value Date   LDLCALC 108 (H) 08/20/2023   Uncontrolled, , pt to continue current statin crestor  40 mg with good compliance, low chol diet, and declines add zetia

## 2023-08-21 NOTE — Assessment & Plan Note (Signed)
 Last vitamin D  Lab Results  Component Value Date   VD25OH 25.08 (L) 08/20/2023   Low, to start oral replacement

## 2023-08-21 NOTE — Assessment & Plan Note (Signed)
 Mild to mod, for antibx course augmentin  bid course, cough med prn, and meclizine  12.5 mg prn vertigo, to f/u any worsening symptoms or concerns

## 2023-08-21 NOTE — Assessment & Plan Note (Signed)
 Lab Results  Component Value Date   TSH 2.12 08/20/2023   Stable, pt to continue current no replacement

## 2023-08-21 NOTE — Assessment & Plan Note (Signed)
 Lab Results  Component Value Date   HGBA1C 6.0 08/20/2023   Stable, pt to continue current medical treatment actos  15 mg qd

## 2023-08-21 NOTE — Assessment & Plan Note (Signed)
 Lab Results  Component Value Date   VITAMINB12 1,099 (H) 08/20/2023   Stable, cont oral replacement - b12 1000 mcg qd

## 2023-09-05 ENCOUNTER — Ambulatory Visit: Payer: Self-pay

## 2023-09-05 ENCOUNTER — Ambulatory Visit (INDEPENDENT_AMBULATORY_CARE_PROVIDER_SITE_OTHER): Admitting: Internal Medicine

## 2023-09-05 ENCOUNTER — Encounter: Payer: Self-pay | Admitting: Internal Medicine

## 2023-09-05 VITALS — BP 120/76 | HR 81 | Temp 98.2°F | Ht 59.0 in | Wt 189.0 lb

## 2023-09-05 DIAGNOSIS — E1165 Type 2 diabetes mellitus with hyperglycemia: Secondary | ICD-10-CM

## 2023-09-05 DIAGNOSIS — M25511 Pain in right shoulder: Secondary | ICD-10-CM | POA: Diagnosis not present

## 2023-09-05 DIAGNOSIS — R222 Localized swelling, mass and lump, trunk: Secondary | ICD-10-CM

## 2023-09-05 DIAGNOSIS — E7800 Pure hypercholesterolemia, unspecified: Secondary | ICD-10-CM | POA: Diagnosis not present

## 2023-09-05 DIAGNOSIS — G8929 Other chronic pain: Secondary | ICD-10-CM

## 2023-09-05 MED ORDER — REPATHA SURECLICK 140 MG/ML ~~LOC~~ SOAJ
140.0000 mg | SUBCUTANEOUS | 3 refills | Status: DC
Start: 2023-09-05 — End: 2023-12-11

## 2023-09-05 MED ORDER — TRAMADOL HCL 50 MG PO TABS: 50.0000 mg | ORAL_TABLET | Freq: Four times a day (QID) | ORAL | 0 refills | Status: DC | PRN

## 2023-09-05 MED ORDER — GABAPENTIN 300 MG PO CAPS: 300.0000 mg | ORAL_CAPSULE | Freq: Three times a day (TID) | ORAL | 3 refills | Status: DC

## 2023-09-05 NOTE — Progress Notes (Signed)
 Patient ID: Brandi Shaffer, female   DOB: Jul 18, 1945, 78 y.o.   MRN: [id number]        Chief Complaint: follow up right shoulder pain, right supraclavicular fullness swelling, hld,, dm       HPI:  Brandi Shaffer is a 78 y.o. female here with c/o chronic right shoulder pain with end stage arthritis now worsening pain for the last month, has been putting off surgical evaluation but no longer able to do this.  Now also has unusual right supraclavicular fullness swelling, and associated pain that involves the whole RUE in a neuritic fashion. Willing to add repatha  for lipids today.  Pt denies chest pain, increased sob or doe, wheezing, orthopnea, PND, increased LE swelling, palpitations, dizziness or syncope.   Pt denies polydipsia, polyuria, or new focal neuro s/s.    Pt denies fever, wt loss, night sweats, loss of appetite, or other constitutional symptoms         Wt Readings from Last 3 Encounters:  09/05/23 189 lb (85.7 kg)  08/20/23 187 lb (84.8 kg)  03/27/23 183 lb (83 kg)   BP Readings from Last 3 Encounters:  09/05/23 120/76  08/20/23 126/78  06/21/23 (!) 170/83         Past Medical History:  Diagnosis Date   ANEMIA-IRON DEFICIENCY 12/06/2009   Anxiety state 07/01/2015   Asthma 12/05/2011   DIABETES MELLITUS, TYPE II 09/17/2006   DIVERTICULOSIS, COLON 12/06/2009   GENITAL HERPES 03/24/2010   GERD (gastroesophageal reflux disease) 06/28/2011   HYPERLIPIDEMIA 03/24/2010   HYPERTENSION 09/17/2006   HYPOTHYROIDISM 09/17/2006   LIPOMA 12/06/2009   PERIPHERAL EDEMA 03/24/2010   Renal cyst 07/05/2011   1.9 cm minimally complex   Past Surgical History:  Procedure Laterality Date   ABDOMINAL HYSTERECTOMY     fibroids   ankle surgury     x 3 left - chronic pain/swelling   COLONOSCOPY  2011   SHOULDER SURGERY Left    TONSILLECTOMY     TUBAL LIGATION      reports that she has never smoked. She has never been exposed to tobacco smoke. She has never used smokeless tobacco. She reports that she  does not drink alcohol and does not use drugs. family history includes Breast cancer (age of onset: 35) in her sister; Diabetes in an other family member; Heart disease in her sister; Hypertension in her father and mother; Joint hypermobility in an other family member; Prostate cancer in her brother; Stroke in her sister. Allergies  Allergen Reactions   Januvia [Sitagliptin Phosphate]     dizziness   Lipitor [Atorvastatin ] Other (See Comments)    Myalgia    Metformin  And Related Nausea Only   Soma [Carisoprodol] Other (See Comments)   Doxycycline  Rash   Current Outpatient Medications on File Prior to Visit  Medication Sig Dispense Refill   albuterol  (VENTOLIN  HFA) 108 (90 Base) MCG/ACT inhaler TAKE 2 PUFFS BY MOUTH EVERY 6 HOURS AS NEEDED FOR WHEEZE 18 each 5   amLODipine  (NORVASC ) 10 MG tablet TAKE 1 TABLET BY MOUTH EVERY DAY 90 tablet 3   ammonium lactate  (LAC-HYDRIN ) 12 % lotion APPLY 1 APPLICATION TOPICALLY AS NEEDED FOR DRY SKIN. 400 mL 5   amoxicillin -clavulanate (AUGMENTIN ) 875-125 MG tablet Take 1 tablet by mouth 2 (two) times daily. 20 tablet 0   APPLE CIDER VINEGAR PO Take by mouth.     aspirin  81 MG EC tablet TAKE 1 TABLET BY MOUTH DAILY. SWALLOW WHOLE. 30 tablet 11   chlorhexidine  (  PERIDEX ) 0.12 % solution USE AS DIRECTED 473 mL 11   Cholecalciferol  50 MCG (2000 UT) TABS 1 tab by mouth once daily 30 tablet 99   clotrimazole  (LOTRIMIN ) 1 % cream Apply to affected feet and between toes twice daily for 6 weeks for athlete's feet 60 g 1   cyclobenzaprine  (FLEXERIL ) 5 MG tablet Take 1 tablet (5 mg total) by mouth 3 (three) times daily as needed for muscle spasms. 30 tablet 1   fluconazole  (DIFLUCAN ) 150 MG tablet Take 1 tablet (150 mg total) by mouth once a week. 2 tablet 0   furosemide  (LASIX ) 20 MG tablet Take 1 tablet (20 mg total) by mouth daily as needed. 90 tablet 3   losartan  (COZAAR ) 25 MG tablet TAKE 1 TABLET (25 MG TOTAL) BY MOUTH DAILY. 90 tablet 3   meclizine  (ANTIVERT )  12.5 MG tablet Take 1 tablet (12.5 mg total) by mouth 3 (three) times daily as needed. 40 tablet 1   Misc Natural Products (CRANBERRY/PROBIOTIC PO) Take by mouth.     Multiple Vitamins-Minerals (WOMENS MULTIVITAMIN PO) Take 1 tablet by mouth daily.     OneTouch Delica Lancets 33G MISC 1 each by Does not apply route 2 (two) times daily. Use to check blood sugars twice a day Dx E11.9 200 each 11   ONETOUCH VERIO test strip USE 2 (TWO) TIMES DAILY. DX E11.9 100 strip 4   pioglitazone  (ACTOS ) 15 MG tablet TAKE 1 TABLET BY MOUTH EVERY DAY 90 tablet 3   potassium chloride  (KLOR-CON ) 10 MEQ tablet TAKE 1 TABLET BY MOUTH EVERY DAY 90 tablet 3   predniSONE  (DELTASONE ) 20 MG tablet Take 2 pills for 3 days, 1 pill for 4 days 10 tablet 0   rosuvastatin  (CRESTOR ) 40 MG tablet Take 1 tablet (40 mg total) by mouth daily. 90 tablet 3   tacrolimus  (PROTOPIC ) 0.1 % ointment Apply topically 2 (two) times daily. 100 g 3   triamcinolone  cream (KENALOG ) 0.1 % Apply 1 Application topically daily. (JAR) 453.6 g 0   vitamin B-12 (CYANOCOBALAMIN ) 1000 MCG tablet 1 tab by mouth mon - wed - thur 90 tablet 3   No current facility-administered medications on file prior to visit.        ROS:  All others reviewed and negative.  Objective        PE:  BP 120/76   Pulse 81   Temp 98.2 F (36.8 C)   Ht 4' 11 (1.499 m)   Wt 189 lb (85.7 kg)   SpO2 98%   BMI 38.17 kg/m                 Constitutional: Pt appears in NAD               HENT: Head: NCAT.                Right Ear: External ear normal.                 Left Ear: External ear normal.                Eyes: . Pupils are equal, round, and reactive to light. Conjunctivae and EOM are normal               Nose: without d/c or deformity               Neck: Neck supple. Gross normal ROM  Cardiovascular: Normal rate and regular rhythm.                 Pulmonary/Chest: Effort normal and breath sounds without rales or wheezing. Right supraclavicular area  with large swelling tender but no overlying skin change; right shoulder with marked reduced ROM to forward elevation               Abd:  Soft, NT, ND, + BS, no organomegaly               Neurological: Pt is alert. At baseline orientation, motor grossly intact               Skin: Skin is warm. No rashes, no other new lesions, LE edema - none               Psychiatric: Pt behavior is normal without agitation   Micro: none  Cardiac tracings I have personally interpreted today:  none  Pertinent Radiological findings (summarize): none   Lab Results  Component Value Date   WBC 6.5 08/20/2023   HGB 13.5 08/20/2023   HCT 40.5 08/20/2023   PLT 257.0 08/20/2023   GLUCOSE 87 08/20/2023   CHOL 190 08/20/2023   TRIG 89.0 08/20/2023   HDL 64.20 08/20/2023   LDLDIRECT 134.6 05/23/2010   LDLCALC 108 (H) 08/20/2023   ALT 14 08/20/2023   AST 24 08/20/2023   NA 142 08/20/2023   K 3.8 08/20/2023   CL 106 08/20/2023   CREATININE 0.95 08/20/2023   BUN 16 08/20/2023   CO2 30 08/20/2023   TSH 2.12 08/20/2023   HGBA1C 6.0 08/20/2023   MICROALBUR 1.0 08/20/2023   Assessment/Plan:  Brandi Shaffer is a 78 y.o. Black or African American [2] female with  has a past medical history of ANEMIA-IRON DEFICIENCY (12/06/2009), Anxiety state (07/01/2015), Asthma (12/05/2011), DIABETES MELLITUS, TYPE II (09/17/2006), DIVERTICULOSIS, COLON (12/06/2009), GENITAL HERPES (03/24/2010), GERD (gastroesophageal reflux disease) (06/28/2011), HYPERLIPIDEMIA (03/24/2010), HYPERTENSION (09/17/2006), HYPOTHYROIDISM (09/17/2006), LIPOMA (12/06/2009), PERIPHERAL EDEMA (03/24/2010), and Renal cyst (07/05/2011).  Supraclavicular fossa fullness Quite unusual with pain and swelling that can't r/o pulm issue, for CT chest, but o/w possibly related to severe neuritis of the underlying plexus it seems, also for trial gabapentin  100 tid  Hyperlipidemia Lab Results  Component Value Date   LDLCALC 108 (H) 08/20/2023   Uncontrolled, pt to  continue current statin crestor  40 mg but add repatha  140 mg    Diabetes (HCC) Lab Results  Component Value Date   HGBA1C 6.0 08/20/2023   Stable, pt to continue current medical treatment actos  15 mg qd   Chronic right shoulder pain Likely end stage arthritic per pt - for ortho referral Dr Kay  Followup: Return in about 3 months (around 12/06/2023).  Lynwood Rush, MD 09/06/2023 7:31 PM [facility] [place]

## 2023-09-05 NOTE — Telephone Encounter (Signed)
 FYI Only or Action Required?: FYI only for provider.  Patient was last seen in primary care on 08/20/2023 by Norleen Lynwood ORN, MD.  Called Nurse Triage reporting Neck Pain.  Symptoms began several days ago.  Interventions attempted: OTC medications: tylenol  and Ice/heat application.  Symptoms are: unchanged.  Triage Disposition: See PCP When Office is Open (Within 3 Days)  Patient/caregiver understands and will follow disposition?: Yes        Copied from CRM [id number]. Topic: Clinical - Red Word Triage >> Sep 05, 2023  8:04 AM Mesmerise C wrote: [facility] that prompted transfer to Nurse Triage: Pain in right arm was supposed to have surgery years ago, but was told couldn't be able to fix the issue, patient states the pain starts at her neck all the way to her hands, uses heat pads to help but pain comes back Reason for Disposition  [1] MODERATE neck pain (e.g., interferes with normal activities) AND [2] present > 3 days  Answer Assessment - Initial Assessment Questions 1. ONSET: When did the pain begin?      Several days 2. LOCATION: Where does it hurt?      neck 3. PATTERN Does the pain come and go, or has it been constant since it started?      Constant aching pain 4. SEVERITY: How bad is the pain?  (Scale 0-10; or none or slight stiffness, mild, moderate, severe)     Mod 5. RADIATION: Does the pain go anywhere else, shoot into your arms?     Down right arm and hand 6. CORD SYMPTOMS: Any weakness or numbness of the arms or legs?     Weakness in right hand 7. CAUSE: What do you think is causing the neck pain?     unknown 8. NECK OVERUSE: Any recent activities that involved turning or twisting the neck?     no 9. OTHER SYMPTOMS: Do you have any other symptoms? (e.g., headache, fever, chest pain, difficulty breathing, neck swelling)     no  Protocols used: Neck Pain or Stiffness-A-AH

## 2023-09-05 NOTE — Patient Instructions (Signed)
 Please take all new medication as prescribed- the tramadol  for pain  Ok to start the gabapentin  100 mg you have at home, but then after 5-7 days you can change to gabapentin  300 mg   Please take all new medication as prescribed- the repatha  for cholesterol  Please continue all other medications as before, and refills have been done if requested.  Please have the pharmacy call with any other refills you may need.  Please continue your efforts at being more active, low cholesterol diet, and weight control.  Please keep your appointments with your specialists as you may have planned  You will be contacted regarding the referral for: Dr Kay at [facility], and CT chest for the right neck swelling  No need for more lab testing today  Please make an Appointment to return in 3 months, or sooner if needed

## 2023-09-06 ENCOUNTER — Encounter: Payer: Self-pay | Admitting: Internal Medicine

## 2023-09-06 DIAGNOSIS — R222 Localized swelling, mass and lump, trunk: Secondary | ICD-10-CM | POA: Insufficient documentation

## 2023-09-06 DIAGNOSIS — G8929 Other chronic pain: Secondary | ICD-10-CM | POA: Insufficient documentation

## 2023-09-06 NOTE — Assessment & Plan Note (Signed)
 Lab Results  Component Value Date   HGBA1C 6.0 08/20/2023   Stable, pt to continue current medical treatment actos  15 mg qd

## 2023-09-06 NOTE — Assessment & Plan Note (Signed)
 Likely end stage arthritic per pt - for ortho referral Dr Kay

## 2023-09-06 NOTE — Assessment & Plan Note (Addendum)
 Quite unusual with pain and swelling that can't r/o pulm issue, for CT chest, but o/w possibly related to severe neuritis of the underlying plexus it seems, also for trial gabapentin  100 tid

## 2023-09-06 NOTE — Assessment & Plan Note (Signed)
 Lab Results  Component Value Date   LDLCALC 108 (H) 08/20/2023   Uncontrolled, pt to continue current statin crestor  40 mg but add repatha  140 mg

## 2023-09-11 DIAGNOSIS — M542 Cervicalgia: Secondary | ICD-10-CM | POA: Diagnosis not present

## 2023-09-11 DIAGNOSIS — M25511 Pain in right shoulder: Secondary | ICD-10-CM | POA: Diagnosis not present

## 2023-09-20 ENCOUNTER — Ambulatory Visit (HOSPITAL_COMMUNITY)
Admission: RE | Admit: 2023-09-20 | Discharge: 2023-09-20 | Disposition: A | Discharge status: Home / Self Care | Source: Ambulatory Visit | Attending: Internal Medicine | Admitting: Internal Medicine

## 2023-09-20 ENCOUNTER — Ambulatory Visit: Payer: Self-pay

## 2023-09-20 DIAGNOSIS — M25561 Pain in right knee: Secondary | ICD-10-CM | POA: Diagnosis not present

## 2023-09-20 DIAGNOSIS — M25562 Pain in left knee: Secondary | ICD-10-CM | POA: Diagnosis not present

## 2023-09-20 DIAGNOSIS — R079 Chest pain, unspecified: Secondary | ICD-10-CM | POA: Diagnosis not present

## 2023-09-20 DIAGNOSIS — M25512 Pain in left shoulder: Secondary | ICD-10-CM | POA: Diagnosis not present

## 2023-09-20 DIAGNOSIS — R222 Localized swelling, mass and lump, trunk: Secondary | ICD-10-CM | POA: Diagnosis present

## 2023-09-20 DIAGNOSIS — I700 Atherosclerosis of aorta: Secondary | ICD-10-CM | POA: Diagnosis not present

## 2023-09-20 DIAGNOSIS — M25511 Pain in right shoulder: Secondary | ICD-10-CM | POA: Diagnosis not present

## 2023-09-20 NOTE — Telephone Encounter (Signed)
 FYI Only or Action Required?: FYI only for provider.  Patient was last seen in primary care on 09/05/2023 by Norleen Lynwood ORN, MD.  Called Nurse Triage reporting Fall.  Symptoms began today.  Interventions attempted: Rest, hydration, or home remedies.  Symptoms are: unchanged.  Triage Disposition: See PCP When Office is Open (Within 3 Days), See HCP Within 4 Hours (Or PCP Triage)-patient to be evaluated at urgent care post fall. Continual shoulder pain  Patient/caregiver understands and will follow disposition?: Yes  Copied from CRM [id number]. Topic: Clinical - Red Word Triage >> Sep 20, 2023 11:05 AM Suzen RAMAN wrote: Red Word that prompted transfer to Nurse Triage: Fall(hit knee and fell on shoulder that is already injured) Reason for Disposition  [1] MODERATE weakness (e.g., interferes with work, school, normal activities) AND [2] new-onset or getting worse  [1] MODERATE pain (e.g., interferes with normal activities) AND [2] present > 3 days  Answer Assessment - Initial Assessment Questions 1. MECHANISM: How did the fall happen?     Patient fell while she was leaving the hospital after her CT scan. Patient thinks she may have tripped on a lump in the floor.  2. DOMESTIC VIOLENCE AND ELDER ABUSE SCREENING: Did you fall because someone pushed you or tried to hurt you? If Yes, ask: Are you safe now?     no 3. ONSET: When did the fall happen? (e.g., minutes, hours, or days ago)     Occurred today after her CT scan 4. LOCATION: What part of the body hit the ground? (e.g., back, buttocks, head, hips, knees, hands, head, stomach)     Left knee and left shoulder 5. INJURY: Did you hurt (injure) yourself when you fell? If Yes, ask: What did you injure? Tell me more about this? (e.g., body area; type of injury; pain severity)     Patient states she  6. PAIN: Is there any pain? If Yes, ask: How bad is the pain? (e.g., Scale 0-10; or none, mild,      7-8 out of 10 7. SIZE:  For cuts, bruises, or swelling, ask: How large is it? (e.g., inches or centimeters)      no 9. OTHER SYMPTOMS: Do you have any other symptoms? (e.g., dizziness, fever, weakness; new-onset or worsening).      No other symptoms.  10. CAUSE: What do you think caused the fall (or falling)? (e.g., dizzy spell, tripped)       Believes she may have tripped  Answer Assessment - Initial Assessment Questions 1. ONSET: When did the pain start?     Pain has been going on for a couple of week.  2. LOCATION: Where is the pain located?     Left shoulder 3. PAIN: How bad is the pain? (Scale 1-10; or mild, moderate, severe)     7-8 out of 10 4. WORK OR EXERCISE: Has there been any recent work or exercise that involved this part of the body?     No but patient did fall today while leaving the hospital after CT scan. 5. CAUSE: What do you think is causing the shoulder pain?     unsure 6. OTHER SYMPTOMS: Do you have any other symptoms? (e.g., neck pain, swelling, rash, fever, numbness, weakness)     No other symptoms  Protocols used: Falls and Falling-A-AH, Shoulder Pain-A-AH

## 2023-09-20 NOTE — Progress Notes (Signed)
 Patient fell while leaving the hospital after her CT scan.  Landed on left knee and right shoulder.  Rapid RN evaluated the patient.  Patient declined to go the ED and went home instead.

## 2023-09-21 ENCOUNTER — Other Ambulatory Visit: Payer: Self-pay | Admitting: Dermatology

## 2023-09-21 DIAGNOSIS — M793 Panniculitis, unspecified: Secondary | ICD-10-CM

## 2023-09-24 ENCOUNTER — Ambulatory Visit: Payer: 59 | Admitting: Internal Medicine

## 2023-09-27 ENCOUNTER — Ambulatory Visit: Payer: Self-pay | Admitting: Internal Medicine

## 2023-10-04 ENCOUNTER — Other Ambulatory Visit: Payer: Self-pay | Admitting: Internal Medicine

## 2023-10-05 ENCOUNTER — Other Ambulatory Visit: Payer: Self-pay | Admitting: Internal Medicine

## 2023-10-05 ENCOUNTER — Other Ambulatory Visit: Payer: Self-pay | Admitting: Dermatology

## 2023-10-05 DIAGNOSIS — M793 Panniculitis, unspecified: Secondary | ICD-10-CM

## 2023-10-09 DIAGNOSIS — M25511 Pain in right shoulder: Secondary | ICD-10-CM | POA: Diagnosis not present

## 2023-11-07 ENCOUNTER — Other Ambulatory Visit: Payer: 59

## 2023-11-19 DIAGNOSIS — R42 Dizziness and giddiness: Secondary | ICD-10-CM | POA: Diagnosis not present

## 2023-11-26 ENCOUNTER — Ambulatory Visit (HOSPITAL_BASED_OUTPATIENT_CLINIC_OR_DEPARTMENT_OTHER)
Admission: RE | Admit: 2023-11-26 | Discharge: 2023-11-26 | Disposition: A | Discharge status: Home / Self Care | Source: Ambulatory Visit | Attending: Internal Medicine | Admitting: Internal Medicine

## 2023-11-26 ENCOUNTER — Ambulatory Visit: Payer: Self-pay | Admitting: Internal Medicine

## 2023-11-26 DIAGNOSIS — Z780 Asymptomatic menopausal state: Secondary | ICD-10-CM | POA: Diagnosis not present

## 2023-11-30 ENCOUNTER — Other Ambulatory Visit: Payer: Self-pay | Admitting: Internal Medicine

## 2023-12-03 ENCOUNTER — Ambulatory Visit: Payer: 59 | Admitting: Dermatology

## 2023-12-04 ENCOUNTER — Other Ambulatory Visit: Payer: Self-pay

## 2023-12-04 ENCOUNTER — Other Ambulatory Visit: Payer: Self-pay | Admitting: Internal Medicine

## 2023-12-11 ENCOUNTER — Ambulatory Visit: Payer: Self-pay | Admitting: Internal Medicine

## 2023-12-11 ENCOUNTER — Encounter: Payer: Self-pay | Admitting: Internal Medicine

## 2023-12-11 ENCOUNTER — Other Ambulatory Visit (HOSPITAL_COMMUNITY): Payer: Self-pay

## 2023-12-11 ENCOUNTER — Other Ambulatory Visit: Payer: Self-pay | Admitting: Internal Medicine

## 2023-12-11 ENCOUNTER — Telehealth: Payer: Self-pay

## 2023-12-11 ENCOUNTER — Ambulatory Visit: Admitting: Internal Medicine

## 2023-12-11 VITALS — BP 126/80 | HR 80 | Temp 98.1°F | Ht 59.0 in | Wt 186.0 lb

## 2023-12-11 DIAGNOSIS — E538 Deficiency of other specified B group vitamins: Secondary | ICD-10-CM | POA: Diagnosis not present

## 2023-12-11 DIAGNOSIS — I10 Essential (primary) hypertension: Secondary | ICD-10-CM | POA: Diagnosis not present

## 2023-12-11 DIAGNOSIS — E7800 Pure hypercholesterolemia, unspecified: Secondary | ICD-10-CM | POA: Diagnosis not present

## 2023-12-11 DIAGNOSIS — E1165 Type 2 diabetes mellitus with hyperglycemia: Secondary | ICD-10-CM

## 2023-12-11 DIAGNOSIS — I2510 Atherosclerotic heart disease of native coronary artery without angina pectoris: Secondary | ICD-10-CM | POA: Diagnosis not present

## 2023-12-11 DIAGNOSIS — R252 Cramp and spasm: Secondary | ICD-10-CM

## 2023-12-11 DIAGNOSIS — E559 Vitamin D deficiency, unspecified: Secondary | ICD-10-CM | POA: Diagnosis not present

## 2023-12-11 DIAGNOSIS — I2583 Coronary atherosclerosis due to lipid rich plaque: Secondary | ICD-10-CM

## 2023-12-11 DIAGNOSIS — E119 Type 2 diabetes mellitus without complications: Secondary | ICD-10-CM

## 2023-12-11 LAB — CBC WITH DIFFERENTIAL/PLATELET
Basophils Absolute: 0 K/uL (ref 0.0–0.1)
Basophils Relative: 0.8 % (ref 0.0–3.0)
Eosinophils Absolute: 0.1 K/uL (ref 0.0–0.7)
Eosinophils Relative: 2.7 % (ref 0.0–5.0)
HCT: 40.1 % (ref 36.0–46.0)
Hemoglobin: 13.2 g/dL (ref 12.0–15.0)
Lymphocytes Relative: 40 % (ref 12.0–46.0)
Lymphs Abs: 2.1 K/uL (ref 0.7–4.0)
MCHC: 32.9 g/dL (ref 30.0–36.0)
MCV: 93.4 fl (ref 78.0–100.0)
Monocytes Absolute: 0.6 K/uL (ref 0.1–1.0)
Monocytes Relative: 10.7 % (ref 3.0–12.0)
Neutro Abs: 2.5 K/uL (ref 1.4–7.7)
Neutrophils Relative %: 45.8 % (ref 43.0–77.0)
Platelets: 257 K/uL (ref 150.0–400.0)
RBC: 4.29 Mil/uL (ref 3.87–5.11)
RDW: 14.7 % (ref 11.5–15.5)
WBC: 5.4 K/uL (ref 4.0–10.5)

## 2023-12-11 LAB — URINALYSIS, ROUTINE W REFLEX MICROSCOPIC
Bilirubin Urine: NEGATIVE
Hgb urine dipstick: NEGATIVE
Ketones, ur: NEGATIVE
Nitrite: NEGATIVE
Specific Gravity, Urine: 1.02 (ref 1.000–1.030)
Total Protein, Urine: NEGATIVE
Urine Glucose: NEGATIVE
Urobilinogen, UA: 0.2 (ref 0.0–1.0)
pH: 6 (ref 5.0–8.0)

## 2023-12-11 LAB — LIPID PANEL
Cholesterol: 204 mg/dL — ABNORMAL HIGH (ref 0–200)
HDL: 57.2 mg/dL (ref >39.00)
LDL Cholesterol: 128 mg/dL — ABNORMAL HIGH (ref 0–99)
NonHDL: 147.03
Total CHOL/HDL Ratio: 4
Triglycerides: 93 mg/dL (ref 0.0–149.0)
VLDL: 18.6 mg/dL (ref 0.0–40.0)

## 2023-12-11 LAB — MICROALBUMIN / CREATININE URINE RATIO
Creatinine,U: 102.9 mg/dL
Microalb Creat Ratio: 16.4 mg/g (ref 0.0–30.0)
Microalb, Ur: 1.7 mg/dL (ref 0.0–1.9)

## 2023-12-11 LAB — VITAMIN D 25 HYDROXY (VIT D DEFICIENCY, FRACTURES): VITD: 17.95 ng/mL — ABNORMAL LOW (ref 30.00–100.00)

## 2023-12-11 LAB — BASIC METABOLIC PANEL WITH GFR
BUN: 17 mg/dL (ref 6–23)
CO2: 28 meq/L (ref 19–32)
Calcium: 9.2 mg/dL (ref 8.4–10.5)
Chloride: 107 meq/L (ref 96–112)
Creatinine, Ser: 0.93 mg/dL (ref 0.40–1.20)
GFR: 58.68 mL/min — ABNORMAL LOW (ref >60.00)
Glucose, Bld: 92 mg/dL (ref 70–99)
Potassium: 3.9 meq/L (ref 3.5–5.1)
Sodium: 143 meq/L (ref 135–145)

## 2023-12-11 LAB — HEPATIC FUNCTION PANEL
ALT: 13 U/L (ref 0–35)
AST: 19 U/L (ref 0–37)
Albumin: 4.3 g/dL (ref 3.5–5.2)
Alkaline Phosphatase: 82 U/L (ref 39–117)
Bilirubin, Direct: 0.1 mg/dL (ref 0.0–0.3)
Total Bilirubin: 0.6 mg/dL (ref 0.2–1.2)
Total Protein: 6.9 g/dL (ref 6.0–8.3)

## 2023-12-11 LAB — HEMOGLOBIN A1C: Hgb A1c MFr Bld: 6.1 % (ref 4.6–6.5)

## 2023-12-11 LAB — TSH: TSH: 1.38 u[IU]/mL (ref 0.35–5.50)

## 2023-12-11 LAB — VITAMIN B12: Vitamin B-12: 1180 pg/mL — ABNORMAL HIGH (ref 211–911)

## 2023-12-11 MED ORDER — CYCLOBENZAPRINE HCL 5 MG PO TABS: 5.0000 mg | ORAL_TABLET | Freq: Every day | ORAL | 1 refills | Status: DC

## 2023-12-11 MED ORDER — EZETIMIBE 10 MG PO TABS: 10.0000 mg | ORAL_TABLET | Freq: Every day | ORAL | 3 refills | Status: DC

## 2023-12-11 NOTE — Assessment & Plan Note (Signed)
 BP Readings from Last 3 Encounters:  12/11/23 126/80  09/05/23 120/76  08/20/23 126/78   Stable, pt to continue medical treatment norvasc  10 mg every day, losartan  25 mg qd

## 2023-12-11 NOTE — Assessment & Plan Note (Addendum)
 Lab Results  Component Value Date   HGBA1C 6.0 08/20/2023   Stable with very mild hyperglycemia, pt to continue current medical treatment actos15 mg qd

## 2023-12-11 NOTE — Telephone Encounter (Signed)
 Pharmacy Patient Advocate Encounter  Insurance verification completed.   The patient is insured through [employer] [facility]   Ran test claim for Cyclobenzaprine  HCl 5MG  tablets. Currently a quantity of 90 was needed a PA FOR FOR A 90 day supply and the PLAN WAS WANTED  PA . The current 30 day co-pay is, $0.00.  No PA needed at this time.  PLEASE BE ADVISED CALLED PHARMACY AND THEY REPROCESSED FOR PT AND WILL LET HER KNOW   This test claim was processed through [facility]- copay amounts may vary at other pharmacies due to pharmacy/plan contracts, or as the patient moves through the different stages of their insurance plan.

## 2023-12-11 NOTE — Addendum Note (Signed)
 Addended by: NORLEEN LYNWOOD ORN on: 12/11/2023 01:12 PM   Modules accepted: Orders

## 2023-12-11 NOTE — Assessment & Plan Note (Signed)
 Lab Results  Component Value Date   VITAMINB12 1,099 (H) 08/20/2023   Stable, cont oral replacement - b12 1000 mcg qd

## 2023-12-11 NOTE — Assessment & Plan Note (Signed)
 Lab Results  Component Value Date   LDLCALC 108 (H) 08/20/2023   Improved but uncontrolled, pt states has statin muscle pain and crestor  and does not want statin, delcines repatha  due to this being a shot, but ok for zetia 10 mg qd

## 2023-12-11 NOTE — Patient Instructions (Signed)
 Please take all new medication as prescribed - the zetia 10 mg per day, and the flexeril  muscle relaxer at bedtime for cramps  Please continue all other medications as before, and refills have been done if requested.  Please have the pharmacy call with any other refills you may need.  Please continue your efforts at being more active, low cholesterol diet, and weight control.  Please keep your appointments with your specialists as you may have planned  Please go to the LAB at the blood drawing area for the tests to be done  You will be contacted by phone if any changes need to be made immediately.  Otherwise, you will receive a letter about your results with an explanation, but please check with MyChart first.  Please make an Appointment to return in 6 months, or sooner if needed

## 2023-12-11 NOTE — Assessment & Plan Note (Signed)
 Right medial thigh recurrent, exam benign, ok for flexeril  5 at bedtime prn

## 2023-12-11 NOTE — Progress Notes (Signed)
 Patient ID: Brandi Shaffer, female   DOB: 1946/01/02, 78 y.o.   MRN: [id number]        Chief Complaint: follow up HTN, HLD, DM, low vit d and b12       HPI:  Brandi Shaffer is a 78 y.o. female here overall doing ok, Pt denies chest pain, increased sob or doe, wheezing, orthopnea, PND, increased LE swelling, palpitations, dizziness or syncope.  . Pt denies polydipsia, polyuria, or new focal neuro s/s.    Pt denies fever, wt loss, night sweats, loss of appetite, or other constitutional symptoms  Does have nightly leg cramps in recent weeks, and makes her jump out bed with the severity, walking makes better, seems to occur at the right medial thigh only, and has some soreness in the area only when it happens.  Also mentions she has right cataract due for surgury soon.  Wt Readings from Last 3 Encounters:  12/11/23 186 lb (84.4 kg)  09/05/23 189 lb (85.7 kg)  08/20/23 187 lb (84.8 kg)   BP Readings from Last 3 Encounters:  12/11/23 126/80  09/05/23 120/76  08/20/23 126/78         Past Medical History:  Diagnosis Date   ANEMIA-IRON DEFICIENCY 12/06/2009   Anxiety state 07/01/2015   Asthma 12/05/2011   DIABETES MELLITUS, TYPE II 09/17/2006   DIVERTICULOSIS, COLON 12/06/2009   GENITAL HERPES 03/24/2010   GERD (gastroesophageal reflux disease) 06/28/2011   HYPERLIPIDEMIA 03/24/2010   HYPERTENSION 09/17/2006   HYPOTHYROIDISM 09/17/2006   LIPOMA 12/06/2009   PERIPHERAL EDEMA 03/24/2010   Renal cyst 07/05/2011   1.9 cm minimally complex   Past Surgical History:  Procedure Laterality Date   ABDOMINAL HYSTERECTOMY     fibroids   ankle surgury     x 3 left - chronic pain/swelling   COLONOSCOPY  2011   SHOULDER SURGERY Left    TONSILLECTOMY     TUBAL LIGATION      reports that she has never smoked. She has never been exposed to tobacco smoke. She has never used smokeless tobacco. She reports that she does not drink alcohol and does not use drugs. family history includes Breast cancer (age of  onset: 44) in her sister; Diabetes in an other family member; Heart disease in her sister; Hypertension in her father and mother; Joint hypermobility in an other family member; Prostate cancer in her brother; Stroke in her sister. Allergies  Allergen Reactions   Januvia [Sitagliptin Phosphate]     dizziness   Lipitor [Atorvastatin ] Other (See Comments)    Myalgia    Metformin  And Related Nausea Only   Soma [Carisoprodol] Other (See Comments)   Doxycycline  Rash   Current Outpatient Medications on File Prior to Visit  Medication Sig Dispense Refill   albuterol  (VENTOLIN  HFA) 108 (90 Base) MCG/ACT inhaler TAKE 2 PUFFS BY MOUTH EVERY 6 HOURS AS NEEDED FOR WHEEZE 18 each 5   amLODipine  (NORVASC ) 10 MG tablet TAKE 1 TABLET BY MOUTH EVERY DAY 90 tablet 3   ammonium lactate  (LAC-HYDRIN ) 12 % lotion APPLY 1 APPLICATION TOPICALLY AS NEEDED FOR DRY SKIN. 400 mL 5   amoxicillin -clavulanate (AUGMENTIN ) 875-125 MG tablet Take 1 tablet by mouth 2 (two) times daily. 20 tablet 0   APPLE CIDER VINEGAR PO Take by mouth.     aspirin  81 MG EC tablet TAKE 1 TABLET BY MOUTH DAILY. SWALLOW WHOLE. 30 tablet 11   chlorhexidine  (PERIDEX ) 0.12 % solution USE AS DIRECTED 473 mL 11   Cholecalciferol   50 MCG (2000 UT) TABS 1 tab by mouth once daily 30 tablet 99   clotrimazole  (LOTRIMIN ) 1 % cream Apply to affected feet and between toes twice daily for 6 weeks for athlete's feet 60 g 1   Evolocumab  (REPATHA  SURECLICK) 140 MG/ML SOAJ Inject 140 mg into the skin every 14 (fourteen) days. 6 mL 3   fluconazole  (DIFLUCAN ) 150 MG tablet Take 1 tablet (150 mg total) by mouth once a week. 2 tablet 0   furosemide  (LASIX ) 20 MG tablet Take 1 tablet (20 mg total) by mouth daily as needed. 90 tablet 3   gabapentin  (NEURONTIN ) 300 MG capsule Take 1 capsule (300 mg total) by mouth 3 (three) times daily. 90 capsule 3   losartan  (COZAAR ) 25 MG tablet TAKE 1 TABLET (25 MG TOTAL) BY MOUTH DAILY. 90 tablet 3   meclizine  (ANTIVERT ) 12.5 MG  tablet Take 1 tablet (12.5 mg total) by mouth 3 (three) times daily as needed. 40 tablet 1   Misc Natural Products (CRANBERRY/PROBIOTIC PO) Take by mouth.     Multiple Vitamins-Minerals (WOMENS MULTIVITAMIN PO) Take 1 tablet by mouth daily.     OneTouch Delica Lancets 33G MISC 1 each by Does not apply route 2 (two) times daily. Use to check blood sugars twice a day Dx E11.9 200 each 11   ONETOUCH VERIO test strip USE 2 (TWO) TIMES DAILY. DX E11.9 100 strip 4   pentoxifylline  (TRENTAL ) 400 MG CR tablet TAKE 1 TABLET BY MOUTH 3 TIMES DAILY WITH MEALS. 270 tablet 0   pioglitazone  (ACTOS ) 15 MG tablet TAKE 1 TABLET BY MOUTH EVERY DAY 90 tablet 3   potassium chloride  (KLOR-CON ) 10 MEQ tablet TAKE 1 TABLET BY MOUTH EVERY DAY 90 tablet 3   predniSONE  (DELTASONE ) 20 MG tablet Take 2 pills for 3 days, 1 pill for 4 days 10 tablet 0   rosuvastatin  (CRESTOR ) 40 MG tablet Take 1 tablet (40 mg total) by mouth daily. 90 tablet 3   tacrolimus  (PROTOPIC ) 0.1 % ointment Apply topically 2 (two) times daily. 100 g 3   traMADol  (ULTRAM ) 50 MG tablet Take 1 tablet (50 mg total) by mouth every 6 (six) hours as needed. 30 tablet 0   triamcinolone  cream (KENALOG ) 0.1 % Apply 1 Application topically daily. (JAR) 453.6 g 0   vitamin B-12 (CYANOCOBALAMIN ) 1000 MCG tablet 1 tab by mouth mon - wed - thur 90 tablet 3   No current facility-administered medications on file prior to visit.        ROS:  All others reviewed and negative.  Objective        PE:  BP 126/80 (BP Location: Right Arm, Patient Position: Sitting, Cuff Size: Normal)   Pulse 80   Temp 98.1 F (36.7 C) (Oral)   Ht 4' 11 (1.499 m)   Wt 186 lb (84.4 kg)   SpO2 99%   BMI 37.57 kg/m                 Constitutional: Pt appears in NAD               HENT: Head: NCAT.                Right Ear: External ear normal.                 Left Ear: External ear normal.                Eyes: . Pupils are equal, round, and reactive to  light. Conjunctivae and EOM  are normal               Nose: without d/c or deformity               Neck: Neck supple. Gross normal ROM               Cardiovascular: Normal rate and regular rhythm.                 Pulmonary/Chest: Effort normal and breath sounds without rales or wheezing.                Abd:  Soft, NT, ND, + BS, no organomegaly               Neurological: Pt is alert. At baseline orientation, motor grossly intact               Skin: Skin is warm. No rashes, no other new lesions, LE edema - none               Psychiatric: Pt behavior is normal without agitation   Micro: none  Cardiac tracings I have personally interpreted today:  none  Pertinent Radiological findings (summarize): none   Lab Results  Component Value Date   WBC 6.5 08/20/2023   HGB 13.5 08/20/2023   HCT 40.5 08/20/2023   PLT 257.0 08/20/2023   GLUCOSE 87 08/20/2023   CHOL 190 08/20/2023   TRIG 89.0 08/20/2023   HDL 64.20 08/20/2023   LDLDIRECT 134.6 05/23/2010   LDLCALC 108 (H) 08/20/2023   ALT 14 08/20/2023   AST 24 08/20/2023   NA 142 08/20/2023   K 3.8 08/20/2023   CL 106 08/20/2023   CREATININE 0.95 08/20/2023   BUN 16 08/20/2023   CO2 30 08/20/2023   TSH 2.12 08/20/2023   HGBA1C 6.0 08/20/2023   MICROALBUR 1.0 08/20/2023   Assessment/Plan:  Brandi Shaffer is a 78 y.o. Black or African American [2] female with  has a past medical history of ANEMIA-IRON DEFICIENCY (12/06/2009), Anxiety state (07/01/2015), Asthma (12/05/2011), DIABETES MELLITUS, TYPE II (09/17/2006), DIVERTICULOSIS, COLON (12/06/2009), GENITAL HERPES (03/24/2010), GERD (gastroesophageal reflux disease) (06/28/2011), HYPERLIPIDEMIA (03/24/2010), HYPERTENSION (09/17/2006), HYPOTHYROIDISM (09/17/2006), LIPOMA (12/06/2009), PERIPHERAL EDEMA (03/24/2010), and Renal cyst (07/05/2011).  B12 deficiency Lab Results  Component Value Date   VITAMINB12 1,099 (H) 08/20/2023   Stable, cont oral replacement - b12 1000 mcg qd   Diabetes (HCC) Lab Results  Component Value  Date   HGBA1C 6.0 08/20/2023   Stable with very mild hyperglycemia, pt to continue current medical treatment actos15 mg qd   Essential hypertension BP Readings from Last 3 Encounters:  12/11/23 126/80  09/05/23 120/76  08/20/23 126/78   Stable, pt to continue medical treatment norvasc  10 mg every day, losartan  25 mg qd   Hyperlipidemia Lab Results  Component Value Date   LDLCALC 108 (H) 08/20/2023   Improved but uncontrolled, pt states has statin muscle pain and crestor  and does not want statin, delcines repatha  due to this being a shot, but ok for zetia 10 mg qd   Vitamin D  deficiency Last vitamin D  Lab Results  Component Value Date   VD25OH 25.08 (L) 08/20/2023   Low, to start oral replacement   Leg cramps Right medial thigh recurrent, exam benign, ok for flexeril  5 at bedtime prn  Followup: Return in about 6 months (around 06/10/2024).  Lynwood Rush, MD 12/11/2023 1:01 PM [facility]

## 2023-12-11 NOTE — Assessment & Plan Note (Signed)
 Last vitamin D  Lab Results  Component Value Date   VD25OH 25.08 (L) 08/20/2023   Low, to start oral replacement

## 2023-12-11 NOTE — Progress Notes (Signed)
 No note  Augmentin  removed from med list as is no longer taking

## 2023-12-13 MED ORDER — TIZANIDINE HCL 2 MG PO CAPS: 2.0000 mg | ORAL_CAPSULE | Freq: Three times a day (TID) | ORAL | 1 refills | Status: DC

## 2023-12-13 NOTE — Addendum Note (Signed)
 Addended by: NORLEEN LYNWOOD ORN on: 12/13/2023 11:52 AM   Modules accepted: Orders

## 2023-12-13 NOTE — Telephone Encounter (Signed)
 Pharmacy Patient Advocate Encounter  Received notification from [facility] that Prior Authorization for Cyclobenzaprine  HCl 5MG  tablets  has been DENIED.  See denial reason below. No denial letter attached in CMM. Will attach denial letter to Media tab once received.   PA #/Case ID/Reference #: [id number]  PLEASE BE ADVISED OUTCOME FROM PLAN:  We denied coverage for this drug because: The information provided by your prescriber did not meet the requirements for covering this medication (prior authorization). Your plan does not allow coverage of this medication based on your prescriber answering No to the following question(s): Has the prescriber determined that taking multiple anticholinergic medications is medically necessary for the patient? [Note: Use of multiple anticholinergic medications in older adults is associated with an increased risk of cognitive decline.]

## 2023-12-13 NOTE — Telephone Encounter (Signed)
 Ok for try change to tizanidine which is more likely to be approved, thanks

## 2023-12-16 ENCOUNTER — Telehealth: Payer: Self-pay | Admitting: Internal Medicine

## 2023-12-16 NOTE — Telephone Encounter (Signed)
 Copied from CRM #[id number]. Topic: Clinical - Prescription Issue >> Dec 16, 2023  3:17 PM Taleah C wrote: Reason for CRM: pt called and stated that one of her prescriptions cost $20 and she could not afford it. She also confused as to why the pharmacy told her that her insurance didn't cover zetia but they gave it to her. Please call and advise.

## 2023-12-17 NOTE — Telephone Encounter (Signed)
 I am not sure what to say about a medication costing $20, as all of her medications should be generic.  If she knows which one, maybe this can be changed.   It should be good to take the zetia as well as this is very easy to take

## 2023-12-18 ENCOUNTER — Other Ambulatory Visit: Payer: Self-pay | Admitting: Internal Medicine

## 2023-12-18 ENCOUNTER — Other Ambulatory Visit: Payer: Self-pay

## 2023-12-18 ENCOUNTER — Telehealth: Payer: Self-pay

## 2023-12-18 NOTE — Telephone Encounter (Signed)
 Copied from CRM [id number]. Topic: General - Other >> Dec 18, 2023 11:08 AM Suzen RAMAN wrote: Reason for CRM: John from [facility] would like a call back to confirm that patient has a chronic condition that would qualify her for a specific health plan.   CB#  [phone] OPT 1 Hours:8 am-5pm

## 2023-12-18 NOTE — Telephone Encounter (Signed)
 Yes, pt has hx of CAD coronary artery disease, and Diabetes E11.9

## 2023-12-20 NOTE — Telephone Encounter (Signed)
 Called back and unable to reach anybody due to there was no ext left in the phone note.

## 2023-12-30 ENCOUNTER — Encounter: Payer: Self-pay | Admitting: Radiology

## 2024-01-01 LAB — OPHTHALMOLOGY REPORT-SCANNED

## 2024-01-04 ENCOUNTER — Other Ambulatory Visit: Payer: Self-pay | Admitting: Dermatology

## 2024-01-04 DIAGNOSIS — M793 Panniculitis, unspecified: Secondary | ICD-10-CM

## 2024-01-13 ENCOUNTER — Telehealth: Payer: Self-pay | Admitting: Internal Medicine

## 2024-01-13 NOTE — Telephone Encounter (Signed)
 LVM to confirm TOC appt with Brandi Shaffer. Please transfer to Directv if needed.

## 2024-01-15 ENCOUNTER — Other Ambulatory Visit: Payer: Self-pay | Admitting: Dermatology

## 2024-01-15 ENCOUNTER — Other Ambulatory Visit: Payer: Self-pay | Admitting: Internal Medicine

## 2024-01-15 DIAGNOSIS — M793 Panniculitis, unspecified: Secondary | ICD-10-CM

## 2024-02-07 ENCOUNTER — Other Ambulatory Visit: Payer: Self-pay | Admitting: Internal Medicine

## 2024-02-11 ENCOUNTER — Other Ambulatory Visit: Payer: Self-pay | Admitting: Internal Medicine

## 2024-02-11 ENCOUNTER — Other Ambulatory Visit: Payer: Self-pay

## 2024-02-12 ENCOUNTER — Other Ambulatory Visit: Payer: Self-pay | Admitting: Dermatology

## 2024-02-12 DIAGNOSIS — M793 Panniculitis, unspecified: Secondary | ICD-10-CM

## 2024-02-17 ENCOUNTER — Other Ambulatory Visit: Payer: Self-pay | Admitting: Internal Medicine

## 2024-02-18 ENCOUNTER — Ambulatory Visit

## 2024-02-18 ENCOUNTER — Ambulatory Visit: Admitting: Internal Medicine

## 2024-02-18 ENCOUNTER — Encounter: Payer: Self-pay | Admitting: Internal Medicine

## 2024-02-18 ENCOUNTER — Telehealth: Payer: Self-pay

## 2024-02-18 ENCOUNTER — Other Ambulatory Visit (HOSPITAL_COMMUNITY): Payer: Self-pay

## 2024-02-18 VITALS — BP 142/82 | HR 88 | Temp 98.3°F | Ht 59.0 in | Wt 177.8 lb

## 2024-02-18 DIAGNOSIS — R3589 Other polyuria: Secondary | ICD-10-CM

## 2024-02-18 DIAGNOSIS — Z789 Other specified health status: Secondary | ICD-10-CM

## 2024-02-18 DIAGNOSIS — E119 Type 2 diabetes mellitus without complications: Secondary | ICD-10-CM

## 2024-02-18 DIAGNOSIS — H2513 Age-related nuclear cataract, bilateral: Secondary | ICD-10-CM

## 2024-02-18 DIAGNOSIS — I10 Essential (primary) hypertension: Secondary | ICD-10-CM

## 2024-02-18 DIAGNOSIS — G43701 Chronic migraine without aura, not intractable, with status migrainosus: Secondary | ICD-10-CM

## 2024-02-18 DIAGNOSIS — R809 Proteinuria, unspecified: Secondary | ICD-10-CM | POA: Diagnosis not present

## 2024-02-18 DIAGNOSIS — G4489 Other headache syndrome: Secondary | ICD-10-CM

## 2024-02-18 DIAGNOSIS — M436 Torticollis: Secondary | ICD-10-CM

## 2024-02-18 DIAGNOSIS — E7800 Pure hypercholesterolemia, unspecified: Secondary | ICD-10-CM

## 2024-02-18 DIAGNOSIS — Z7689 Persons encountering health services in other specified circumstances: Secondary | ICD-10-CM | POA: Diagnosis not present

## 2024-02-18 LAB — URINALYSIS, ROUTINE W REFLEX MICROSCOPIC
Bilirubin Urine: NEGATIVE
Hgb urine dipstick: NEGATIVE
Ketones, ur: NEGATIVE
Leukocytes,Ua: NEGATIVE
Nitrite: NEGATIVE
Specific Gravity, Urine: 1.015 (ref 1.000–1.030)
Total Protein, Urine: NEGATIVE
Urine Glucose: NEGATIVE
Urobilinogen, UA: 0.2 (ref 0.0–1.0)
pH: 7 (ref 5.0–8.0)

## 2024-02-18 LAB — LIPID PANEL
Cholesterol: 180 mg/dL (ref 28–200)
HDL: 52.3 mg/dL
LDL Cholesterol: 110 mg/dL — ABNORMAL HIGH (ref 10–99)
NonHDL: 127.66
Total CHOL/HDL Ratio: 3
Triglycerides: 87 mg/dL (ref 10.0–149.0)
VLDL: 17.4 mg/dL (ref 0.0–40.0)

## 2024-02-18 LAB — MICROALBUMIN / CREATININE URINE RATIO
Creatinine,U: 77.1 mg/dL
Microalb Creat Ratio: 18.3 mg/g (ref 0.0–30.0)
Microalb, Ur: 1.4 mg/dL (ref 0.7–1.9)

## 2024-02-18 LAB — SEDIMENTATION RATE: Sed Rate: 12 mm/h (ref 0–30)

## 2024-02-18 LAB — HIGH SENSITIVITY CRP: CRP, High Sensitivity: 7.63 mg/L — ABNORMAL HIGH (ref 0.200–5.000)

## 2024-02-18 MED ORDER — ACCU-CHEK GUIDE ME W/DEVICE KIT: 1.0000 | PACK | Freq: Every day | 5 refills | Status: DC

## 2024-02-18 MED ORDER — AMLODIPINE BESYLATE 10 MG PO TABS: 10.0000 mg | ORAL_TABLET | Freq: Every day | ORAL | 3 refills | Status: AC

## 2024-02-18 MED ORDER — NURTEC 75 MG PO TBDP: 75.0000 mg | ORAL_TABLET | Freq: Every day | ORAL | 2 refills | Status: DC

## 2024-02-18 MED ORDER — REPATHA SURECLICK 140 MG/ML ~~LOC~~ SOAJ: 140.0000 mg | SUBCUTANEOUS | 2 refills | Status: DC

## 2024-02-18 NOTE — Assessment & Plan Note (Signed)
 Reviewed available data from patient and  BP Readings from Last 3 Encounters:  02/18/24 (!) 142/82  12/11/23 126/80  09/05/23 120/76   My individualized, goal average blood pressure for this patient, after considering the evidence for and against aggressive blood pressure goals as well as their past medical history and preferences, is 130/80 In my medical opinion, this problem is stable, inadequately controlled  We discussed the importance of medication adherence for managing this condition, and the risks of leaving it suboptimally managed.  The patient acknowledged the information presented but respectfully declined to take medications at this time.  We emphasized the importance of contacting us  if their condition worsens or they change their mind about medication. She had run out of amlodipine  but we refilled it and that should resolve the issue.

## 2024-02-18 NOTE — Assessment & Plan Note (Addendum)
 She experiences chronic headaches with pressure sensation, photophobia, and occasional tenderness over the eye, suggesting migraines. However, due to her age and new onset of symptoms, intracranial pathology is a differential consideration. There is no prior history of migraines, and possible dehydration may contribute to her symptoms. An MRI of the brain is ordered to rule out intracranial pathology. She is prescribed migraine medication and advised to monitor hydration status and fluid intake based on urine output. Prescribed Nurtec, wrote prior authorization support documents: Dear Prior Authorization Review Team,  I am writing to request prior authorization for [ubrogepant (Ubrelvy)/rimegepant (Nurtec ODT)/zavegepant (Zavzpret)] for the acute treatment of migraine in my 78 year old patient with new-onset migraine-type headaches.  Clinical History and Diagnosis:  This patient presents with new-onset migraine headaches characterized by right temporoparietal and periorbital pain with positional worsening. The patient has an extensive medical history including:  - Coronary artery disease (CAD)  - Peripheral vascular disease (PVD)  - Type 2 diabetes mellitus  - Essential hypertension  - Hyperlipidemia  - Chronic kidney disease, stage 2  The patient has been evaluated by ophthalmology with no concerning findings. Given the new-onset nature of these headaches in an older adult, neuroimaging has been ordered to exclude secondary causes.  Medical Necessity and Rationale:  Gepants represent the most appropriate acute migraine treatment option for this patient due to the following evidence-based considerations:  1. Contraindication to Triptans: Triptans are contraindicated in patients with established coronary artery disease, peripheral artery disease, or multiple vascular risk factors due to their vasoconstrictive properties. This patient has documented CAD and PVD, making triptans unsafe  regardless of diagnostic uncertainty.[1][2]  2. Gepants as Non-Vasoconstrictive Migraine-Specific Therapy: Gepants (ubrogepant, rimegepant, and zavegepant) are FDA-approved for acute treatment of migraine with or without aura in adults. Unlike triptans, gepants are not vasoconstrictors and do not carry the same cardiovascular contraindications or require the same precautions. This makes them particularly valuable for older adults with cardiovascular comorbidities.[3][1]  3. Evidence in Older Adults: Clinical evidence supports the use of gepants in older adults with migraine. These medications are well-tolerated and efficacious, with no evidence of vasoconstriction or significant cardiovascular adverse effects. Recent literature specifically identifies gepants as promising treatment options for older adults, albeit with consideration of potential drug interactions via CYP3A4 metabolism.[1][2]  4. Cardiovascular Safety Profile: Recent studies have demonstrated favorable cardiovascular safety profiles for CGRP-targeted therapies, including gepants, particularly when compared to traditional treatments in patients with vascular risk factors. The absence of vasoconstrictive effects makes gepants the preferred migraine-specific therapy for patients with established cardiovascular disease.[4][5]  5. American Headache Society Recommendations: The American Headache Society consensus statement supports the use of gepants for patients in whom triptans are contraindicated or ineffective, recognizing their role as evidence-based migraine-specific treatments.[6]  Alternative Treatments Considered:  - NSAIDs: While NSAIDs can be effective for migraine, they require caution in this patient given stage 2 CKD and may be contraindicated depending on cardiac function given documented CAD.[1]  - Acetaminophen : Has demonstrated efficacy but with more modest effect sizes compared to migraine-specific therapies.[1]  -  Triptans: Absolutely contraindicated due to CAD and PVD.[2]  Requested Medication and Dosing:  I am requesting authorization for [specific gepant medication] at the FDA-approved dosing for acute migraine treatment:  - Rimegepant (Nurtec ODT): 75 mg orally disintegrating tablet as needed for migraine attacks   Conclusion:  Given this patient's cardiovascular contraindications to triptans, advanced age, and multiple comorbidities, gepants represent the safest and most appropriate migraine-specific therapy. The evidence strongly supports their use in  older adults with cardiovascular disease, and they are specifically recommended when triptans are contraindicated.[1][2][6]  I respectfully request approval of this medication as medically necessary for this patient's care. Please contact me if you require any additional clinical information.    References Migraine in Older Adults. Hugger SS, Do TP, Ashina H, et al. The Lancet. Neurology. 2023;22(10):934-945. doi:10.1016/S1474-4422(23)00206-5. Migraine: Integrated Approaches to Clinical Management and Emerging Treatments. Dianna HERO, Buse DC, Ashina H, et al. Lancet [place], [place]). 2021;397(10283):1505-1518. doi:10.1016/S0140-6736(20)32342-4. FDA Orange Book. FDA Orange Book. Gepants: Targeting the CGRP Pathway for Migraine Relief. Jakubowska B, Sowa-Ku?ma M. Frontiers in Pharmacology. 2025;16:1708226. doi:10.3389/fphar.2025.1708226. CGRP-Targeted Migraine Therapies in Patients With Vascular Risk Factors or Stroke: A Review. Eller MT, Schwarzov K, Gufler L, et al. Neurology. 2025;105(2):e213852. doi:10.1212/WNL.9999999999786147. The American Headache Society Consensus Statement: Update on Integrating New Migraine Treatments Into Clinical Practice. Ailani J, Burch RC, Robbins MS. Headache. 2021;61(7):1021-1039. doi:10.1111/head.14153.

## 2024-02-18 NOTE — Assessment & Plan Note (Signed)
 Lab Results  Component Value Date   HGBA1C 6.1 12/11/2023   HGBA1C 6.0 08/20/2023   HGBA1C 6.2 03/27/2023  Well-controlled long term recent Hemoglobin A1c check confirmed

## 2024-02-18 NOTE — Telephone Encounter (Signed)
 Pharmacy Patient Advocate Encounter  Received notification from [facility] that Prior Authorization for Nurtec 75MG  dispersible tablets  has been DENIED.  Full denial letter will be uploaded to the media tab. See denial reason below.   PA #/Case ID/Reference #: [id number]

## 2024-02-18 NOTE — Assessment & Plan Note (Signed)
 She recently started a new cholesterol medication on October 22nd, with no adverse effects noted. Headaches could potentially be a side effect. She remains motivated to manage her cholesterol levels and will continue the current medication regimen.

## 2024-02-18 NOTE — Telephone Encounter (Signed)
 Pharmacy Patient Advocate Encounter   Received notification from Physician's Office that prior authorization for Nurtec 75MG  dispersible tablets is required/requested.   Insurance verification completed.   The patient is insured through [facility].   Per test claim: PA required; PA submitted to above mentioned insurance via Latent Key/confirmation #/EOC [facility] Status is pending

## 2024-02-18 NOTE — Progress Notes (Signed)
 " [facility]  Phone: [phone]  - Medical Office Visit -  Visit Date: 02/18/2024 Patient: Brandi Shaffer   DOB: 10-11-1945   78 y.o. Female  MRN: [id number] Patient Care Team: Jesus Bernardino MATSU, MD as PCP - General (Internal Medicine) Claudene Arthea CHRISTELLA, DO as Consulting Physician (Family Medicine) Gaynel Delon CROME, DPM as Consulting Physician (Podiatry) Elner Arley LABOR, MD as Consulting Physician (Ophthalmology) Paul Barrio, OD as Referring Physician (Optometry) Today's Health Care Provider: Bernardino MATSU Jesus, MD  ===========================================   Chief Complaint / Reason for Visit: New PT (Pt is present to est care with pcp) and Headache (On the right side would like to know if her eye could have something to do with it.)  Background: 78 y.o. female who has LIPOMA; Hypothyroidism; Essential hypertension; DIVERTICULOSIS, COLON; Genital herpes; Hyperlipidemia; Nocturia; Right foot pain; Carpal tunnel syndrome of right wrist; Low back pain; GERD (gastroesophageal reflux disease); Renal cyst; Mild aortic stenosis; Asthma; Impingement syndrome of left shoulder; Hx of colonic polyps; Obesity (BMI 30-39.9); Right leg pain; PVD (peripheral vascular disease); Bilateral leg pain; Osteoarthritis of left ankle; Right otitis media; Superficial phlebitis; Pelvic pain; Vitamin D  deficiency; B12 deficiency; Posterior vitreous detachment of right eye; Posterior vitreous detachment of left eye; Diabetes mellitus without complication (HCC); Nuclear sclerotic cataract of both eyes; Leg length discrepancy; Peripheral edema; Multiple skin nodules; Chronic kidney disease, stage 2 (mild); Fatty liver; Heart murmur; Chronic right shoulder pain; Supraclavicular fossa fullness; CAD (coronary artery disease); Leg cramps; and Other headache syndrome on their problem list.  Discussed the use of AI scribe software for clinical note transcription with the patient, who gave verbal  consent to proceed. History of Present Illness 78 year old female who presents with headaches and concerns about cataracts.  She has been experiencing headaches for several months, described as a pressure sensation primarily over her right eye, sometimes extending to the back of her head. The headaches are associated with photophobia, leading her to avoid night driving due to the brightness of lights. She has not been diagnosed with migraines previously and manages the headaches with Tylenol  500 mg, without trying specific migraine medications.  She recently visited her eye doctor, who confirmed cataracts in both eyes, with the right eye being worse. The eye doctor told her her vision was '20/20' and that she could wait before having cataract surgery. She is concerned that the cataracts might contribute to her headaches.  She started a new cholesterol medication on October 22nd and wonders if it could be contributing to her headaches. She is committed to continuing the medication to manage her hyperlipidemia.  She mentions a history of dehydration and describes efforts to maintain hydration by drinking various fluids, including water with electrolytes and coconut water. Despite these efforts, she is often told she is not consuming enough fluids. She monitors her hydration status by her urine output and skin turgor.  She has not had an MRI of her brain and has metal in her ankle from previous surgery, but no pacemaker. She expresses concern about the possibility of a brain tumor, as her brother-in-law experienced similar issues at her age.  Medications updated/reviewed: Current Outpatient Medications on File Prior to Visit  Medication Sig   albuterol  (VENTOLIN  HFA) 108 (90 Base) MCG/ACT inhaler TAKE 2 PUFFS BY MOUTH EVERY 6 HOURS AS NEEDED FOR WHEEZE   aspirin  81 MG EC tablet TAKE 1 TABLET BY MOUTH DAILY. SWALLOW WHOLE.   calcium  carbonate (OSCAL) 1500 (600 Ca) MG  TABS tablet Take by mouth 2 (two)  times daily with a meal. (Patient taking differently: Take by mouth 2 (two) times daily with a meal. Has magnesium and zinc with d3)   chlorhexidine  (PERIDEX ) 0.12 % solution USE AS DIRECTED   Cholecalciferol  50 MCG (2000 UT) TABS 1 tab by mouth once daily   cyclobenzaprine  (FLEXERIL ) 5 MG tablet Take 5 mg by mouth 3 (three) times daily as needed for muscle spasms.   ezetimibe  (ZETIA ) 10 MG tablet Take 1 tablet (10 mg total) by mouth daily.   furosemide  (LASIX ) 20 MG tablet Take 1 tablet (20 mg total) by mouth daily as needed.   gabapentin  (NEURONTIN ) 300 MG capsule TAKE 1 CAPSULE BY MOUTH THREE TIMES A DAY (Patient taking differently: Taking as needed)   losartan  (COZAAR ) 25 MG tablet TAKE 1 TABLET (25 MG TOTAL) BY MOUTH DAILY.   meclizine  (ANTIVERT ) 12.5 MG tablet TAKE 1 TABLET BY MOUTH 3 TIMES DAILY AS NEEDED.   Misc Natural Products (CRANBERRY/PROBIOTIC PO) Take by mouth.   Multiple Vitamins-Minerals (WOMENS MULTIVITAMIN PO) Take 1 tablet by mouth daily.   OneTouch Delica Lancets 33G MISC 1 each by Does not apply route 2 (two) times daily. Use to check blood sugars twice a day Dx E11.9   ONETOUCH VERIO test strip USE 2 (TWO) TIMES DAILY. DX E11.9   pentoxifylline  (TRENTAL ) 400 MG CR tablet TAKE 1 TABLET BY MOUTH 3 TIMES DAILY WITH MEALS. (Patient taking differently: Taking as needed)   pioglitazone  (ACTOS ) 15 MG tablet TAKE 1 TABLET BY MOUTH EVERY DAY   potassium chloride  (KLOR-CON ) 10 MEQ tablet TAKE 1 TABLET BY MOUTH EVERY DAY   tacrolimus  (PROTOPIC ) 0.1 % ointment APPLY TOPICALLY TWICE A DAY   vitamin B-12 (CYANOCOBALAMIN ) 1000 MCG tablet 1 tab by mouth mon - wed - thur   ammonium lactate  (LAC-HYDRIN ) 12 % lotion APPLY 1 APPLICATION TOPICALLY AS NEEDED FOR DRY SKIN.   APPLE CIDER VINEGAR PO Take by mouth. (Patient not taking: Reported on 02/18/2024)   clotrimazole  (LOTRIMIN ) 1 % cream Apply to affected feet and between toes twice daily for 6 weeks for athlete's feet   fluconazole   (DIFLUCAN ) 150 MG tablet Take 1 tablet (150 mg total) by mouth once a week.   predniSONE  (DELTASONE ) 20 MG tablet Take 2 pills for 3 days, 1 pill for 4 days (Patient not taking: Reported on 02/18/2024)   tiZANidine  (ZANAFLEX ) 2 MG tablet TAKE 1 CAPSULE BY MOUTH 3 TIMES DAILY.   traMADol  (ULTRAM ) 50 MG tablet Take 1 tablet (50 mg total) by mouth daily as needed. (Patient not taking: Reported on 02/18/2024)   triamcinolone  cream (KENALOG ) 0.1 % APPLY 1 APPLICATION TOPICALLY DAILY   No current facility-administered medications on file prior to visit.   Medications Discontinued During This Encounter  Medication Reason   amLODipine  (NORVASC ) 10 MG tablet Reorder   Active Medications[1]  Allergies:  Januvia [sitagliptin phosphate], Lipitor [atorvastatin ], Metformin  and related, Soma [carisoprodol], and Doxycycline  Past Medical History:  has a past medical history of ANEMIA-IRON DEFICIENCY (12/06/2009), Anxiety state (07/01/2015), Asthma (12/05/2011), Cellulitis (04/01/2015), CONJUNCTIVITIS, BILATERAL (12/06/2009), Dental infection (04/05/2022), Diabetes (HCC) (09/17/2006), DIABETES MELLITUS, TYPE II (09/17/2006), DIVERTICULOSIS, COLON (12/06/2009), GENITAL HERPES (03/24/2010), GERD (gastroesophageal reflux disease) (06/28/2011), Greater trochanteric bursitis of left hip (12/19/2015), Greater trochanteric bursitis, right (08/09/2020), HYPERLIPIDEMIA (03/24/2010), HYPERTENSION (09/17/2006), HYPOTHYROIDISM (09/17/2006), LIPOMA (12/06/2009), Pain of right thumb (01/06/2019), PERIPHERAL EDEMA (03/24/2010), Renal cyst (07/05/2011), Shoulder dislocation (08/03/2015), Sprain of second toe, left, initial encounter (12/18/2017), and Urinary frequency (01/11/2020). Past  Surgical History:   has a past surgical history that includes Tubal ligation; Abdominal hysterectomy; ankle surgury; Tonsillectomy; Shoulder surgery (Left); and Colonoscopy (2011). Social History:   reports that she has never smoked. She has never  been exposed to tobacco smoke. She has never used smokeless tobacco. She reports that she does not drink alcohol and does not use drugs. Family History:  family history includes Breast cancer (age of onset: 75) in her sister; Diabetes in an other family member; Heart disease in her sister; Hypertension in her father and mother; Joint hypermobility in an other family member; Prostate cancer in her brother; Stroke in her sister. Depression Screen and Health Maintenance:    08/20/2023    1:19 PM 03/19/2023   11:10 AM 12/10/2022   11:37 AM 11/09/2022   11:50 AM  PHQ 2/9 Scores  PHQ - 2 Score 0 0 0 0  PHQ- 9 Score  0  0       Data saved with a previous flowsheet row definition   Health Maintenance  Topic Date Due   Zoster Vaccines- Shingrix (1 of 2) Never done   Influenza Vaccine  09/27/2023   Medicare Annual Wellness (AWV)  03/18/2024   FOOT EXAM  03/26/2024   DTaP/Tdap/Td (3 - Tdap) 06/07/2024   HEMOGLOBIN A1C  06/10/2024   Diabetic kidney evaluation - eGFR measurement  12/10/2024   Diabetic kidney evaluation - Urine ACR  12/10/2024   OPHTHALMOLOGY EXAM  12/31/2024   Pneumococcal Vaccine: 50+ Years  Completed   Bone Density Scan  Completed   Hepatitis C Screening  Completed   Meningococcal B Vaccine  Aged Out   Mammogram  Discontinued   Colonoscopy  Discontinued   COVID-19 Vaccine  Discontinued   Immunization History  Administered Date(s) Administered   Fluad Quad(high Dose 65+) 01/06/2019, 01/11/2021, 01/11/2022   Fluad Trivalent(High Dose 65+) 03/27/2023   INFLUENZA, HIGH DOSE SEASONAL PF 12/04/2012, 12/04/2013, 11/14/2016, 11/15/2017   Influenza Split 11/21/2010, 12/05/2011   Influenza Whole 12/06/2009   Influenza,inj,Quad PF,6+ Mos 12/15/2014   PFIZER(Purple Top)SARS-COV-2 Vaccination 05/23/2019, 05/27/2019, 06/13/2019   PNEUMOCOCCAL CONJUGATE-20 03/27/2023   Pneumococcal Conjugate-13 12/19/2012   Pneumococcal Polysaccharide-23 11/27/2004, 12/06/2009, 06/25/2017   Td  11/27/2004, 06/08/2014     Objective   Physical ExamBP (!) 142/82   Pulse 88   Temp 98.3 F (36.8 C) (Temporal)   Ht 4' 11 (1.499 m)   Wt 177 lb 12.8 oz (80.6 kg)   SpO2 98%   BMI 35.91 kg/m  Wt Readings from Last 10 Encounters:  02/18/24 177 lb 12.8 oz (80.6 kg)  12/11/23 186 lb (84.4 kg)  09/05/23 189 lb (85.7 kg)  08/20/23 187 lb (84.8 kg)  03/27/23 183 lb (83 kg)  03/19/23 186 lb (84.4 kg)  12/20/22 190 lb 14.4 oz (86.6 kg)  12/10/22 189 lb 6.4 oz (85.9 kg)  11/09/22 188 lb 6.4 oz (85.5 kg)  09/24/22 189 lb (85.7 kg)  Vital signs reviewed.  Nursing notes reviewed. Weight trend reviewed. Slowly coming down.  General Appearance:  Well developed, well nourished, well-groomed, healthy-appearing female with Body mass index is 35.91 kg/m. No acute distress appreciable.   Skin: Clear and well-hydrated. Pulmonary:  Normal work of breathing at rest, no respiratory distress apparent. SpO2: 98 %  Musculoskeletal: She demonstrates smooth and coordinated movements throughout all major joints.All extremities are intact.  Neurological:  Awake, alert, oriented, and engaged.  No obvious focal neurological deficits or cognitive impairments.  Sensorium seems unclouded.  Psychiatric:  Appropriate  mood, pleasant and cooperative demeanor, cheerful and engaged during the exam  No results found for any visits on 02/18/24.  Scanned Document on 01/03/2024  Component Date Value   HM Diabetic Eye Exam 01/01/2024 No Retinopathy   Office Visit on 12/11/2023  Component Date Value   Microalb, Ur 12/11/2023 1.7    Creatinine,U 12/11/2023 102.9    Microalb Creat Ratio 12/11/2023 16.4    Hgb A1c MFr Bld 12/11/2023 6.1    Cholesterol 12/11/2023 204 (H)    Triglycerides 12/11/2023 93.0    HDL 12/11/2023 57.20    VLDL 12/11/2023 18.6    LDL Cholesterol 12/11/2023 128 (H)    Total CHOL/HDL Ratio 12/11/2023 4    NonHDL 12/11/2023 147.03    Total Bilirubin 12/11/2023 0.6    Bilirubin, Direct  12/11/2023 0.1    Alkaline Phosphatase 12/11/2023 82    AST 12/11/2023 19    ALT 12/11/2023 13    Total Protein 12/11/2023 6.9    Albumin 12/11/2023 4.3    WBC 12/11/2023 5.4    RBC 12/11/2023 4.29    Hemoglobin 12/11/2023 13.2    HCT 12/11/2023 40.1    MCV 12/11/2023 93.4    MCHC 12/11/2023 32.9    RDW 12/11/2023 14.7    Platelets 12/11/2023 257.0    Neutrophils Relative % 12/11/2023 45.8    Lymphocytes Relative 12/11/2023 40.0    Monocytes Relative 12/11/2023 10.7    Eosinophils Relative 12/11/2023 2.7    Basophils Relative 12/11/2023 0.8    Neutro Abs 12/11/2023 2.5    Lymphs Abs 12/11/2023 2.1    Monocytes Absolute 12/11/2023 0.6    Eosinophils Absolute 12/11/2023 0.1    Basophils Absolute 12/11/2023 0.0    TSH 12/11/2023 1.38    Color, Urine 12/11/2023 YELLOW    APPearance 12/11/2023 CLEAR    Specific Gravity, Urine 12/11/2023 1.020    pH 12/11/2023 6.0    Total Protein, Urine 12/11/2023 NEGATIVE    Urine Glucose 12/11/2023 NEGATIVE    Ketones, ur 12/11/2023 NEGATIVE    Bilirubin Urine 12/11/2023 NEGATIVE    Hgb urine dipstick 12/11/2023 NEGATIVE    Urobilinogen, UA 12/11/2023 0.2    Leukocytes,Ua 12/11/2023 TRACE (A)    Nitrite 12/11/2023 NEGATIVE    WBC, UA 12/11/2023 3-6/hpf (A)    RBC / HPF 12/11/2023 0-2/hpf    Squamous Epithelial / HPF 12/11/2023 Few(5-10/hpf) (A)    Sodium 12/11/2023 143    Potassium 12/11/2023 3.9    Chloride 12/11/2023 107    CO2 12/11/2023 28    Glucose, Bld 12/11/2023 92    BUN 12/11/2023 17    Creatinine, Ser 12/11/2023 0.93    GFR 12/11/2023 58.68 (L)    Calcium  12/11/2023 9.2    VITD 12/11/2023 17.95 (L)    Vitamin B-12 12/11/2023 1,180 (H)   Office Visit on 08/20/2023  Component Date Value   Vitamin B-12 08/20/2023 1,099 (H)    VITD 08/20/2023 25.08 (L)    Sodium 08/20/2023 142    Potassium 08/20/2023 3.8    Chloride 08/20/2023 106    CO2 08/20/2023 30    Glucose, Bld 08/20/2023 87    BUN 08/20/2023 16    Creatinine,  Ser 08/20/2023 0.95    GFR 08/20/2023 57.32 (L)    Calcium  08/20/2023 9.5    Color, Urine 08/20/2023 YELLOW    APPearance 08/20/2023 CLEAR    Specific Gravity, Urine 08/20/2023 1.010    pH 08/20/2023 7.0    Total Protein, Urine 08/20/2023 NEGATIVE    Urine Glucose  08/20/2023 NEGATIVE    Ketones, ur 08/20/2023 NEGATIVE    Bilirubin Urine 08/20/2023 NEGATIVE    Hgb urine dipstick 08/20/2023 NEGATIVE    Urobilinogen, UA 08/20/2023 0.2    Leukocytes,Ua 08/20/2023 NEGATIVE    Nitrite 08/20/2023 NEGATIVE    WBC, UA 08/20/2023 none seen    RBC / HPF 08/20/2023 0-2/hpf    Squamous Epithelial / HPF 08/20/2023 Rare(0-4/hpf)    TSH 08/20/2023 2.12    WBC 08/20/2023 6.5    RBC 08/20/2023 4.39    Hemoglobin 08/20/2023 13.5    HCT 08/20/2023 40.5    MCV 08/20/2023 92.2    MCHC 08/20/2023 33.3    RDW 08/20/2023 14.5    Platelets 08/20/2023 257.0    Neutrophils Relative % 08/20/2023 42.2 (L)    Lymphocytes Relative 08/20/2023 46.2 (H)    Monocytes Relative 08/20/2023 8.6    Eosinophils Relative 08/20/2023 2.5    Basophils Relative 08/20/2023 0.5    Neutro Abs 08/20/2023 2.7    Lymphs Abs 08/20/2023 3.0    Monocytes Absolute 08/20/2023 0.6    Eosinophils Absolute 08/20/2023 0.2    Basophils Absolute 08/20/2023 0.0    Total Bilirubin 08/20/2023 0.5    Bilirubin, Direct 08/20/2023 0.1    Alkaline Phosphatase 08/20/2023 107    AST 08/20/2023 24    ALT 08/20/2023 14    Total Protein 08/20/2023 7.4    Albumin 08/20/2023 4.5    Cholesterol 08/20/2023 190    Triglycerides 08/20/2023 89.0    HDL 08/20/2023 64.20    VLDL 08/20/2023 17.8    LDL Cholesterol 08/20/2023 108 (H)    Total CHOL/HDL Ratio 08/20/2023 3    NonHDL 08/20/2023 125.50    Hgb A1c MFr Bld 08/20/2023 6.0    Microalb, Ur 08/20/2023 1.0    Creatinine,U 08/20/2023 55.0    Microalb Creat Ratio 08/20/2023 17.7   Scanned Document on 07/11/2023  Component Date Value   HM Diabetic Eye Exam 07/04/2023 No Retinopathy   Office  Visit on 03/27/2023  Component Date Value   Hgb A1c MFr Bld 03/27/2023 6.2    Cholesterol 03/27/2023 196    Triglycerides 03/27/2023 76.0    HDL 03/27/2023 60.00    VLDL 03/27/2023 15.2    LDL Cholesterol 03/27/2023 120 (H)    Total CHOL/HDL Ratio 03/27/2023 3    NonHDL 03/27/2023 135.51    Total Bilirubin 03/27/2023 0.5    Bilirubin, Direct 03/27/2023 0.1    Alkaline Phosphatase 03/27/2023 113    AST 03/27/2023 20    ALT 03/27/2023 12    Total Protein 03/27/2023 7.1    Albumin 03/27/2023 4.3    WBC 03/27/2023 5.4    RBC 03/27/2023 4.49    Hemoglobin 03/27/2023 13.9    HCT 03/27/2023 42.2    MCV 03/27/2023 94.0    MCHC 03/27/2023 33.0    RDW 03/27/2023 14.2    Platelets 03/27/2023 265.0    Neutrophils Relative % 03/27/2023 42.8 (L)    Lymphocytes Relative 03/27/2023 44.9    Monocytes Relative 03/27/2023 8.4    Eosinophils Relative 03/27/2023 3.2    Basophils Relative 03/27/2023 0.7    Neutro Abs 03/27/2023 2.3    Lymphs Abs 03/27/2023 2.4    Monocytes Absolute 03/27/2023 0.5    Eosinophils Absolute 03/27/2023 0.2    Basophils Absolute 03/27/2023 0.0    TSH 03/27/2023 1.72    Color, Urine 03/27/2023 YELLOW    APPearance 03/27/2023 CLEAR    Specific Gravity, Urine 03/27/2023 1.015    pH 03/27/2023 7.5  Total Protein, Urine 03/27/2023 NEGATIVE    Urine Glucose 03/27/2023 NEGATIVE    Ketones, ur 03/27/2023 NEGATIVE    Bilirubin Urine 03/27/2023 NEGATIVE    Hgb urine dipstick 03/27/2023 NEGATIVE    Urobilinogen, UA 03/27/2023 0.2    Leukocytes,Ua 03/27/2023 SMALL (A)    Nitrite 03/27/2023 NEGATIVE    WBC, UA 03/27/2023 3-6/hpf (A)    RBC / HPF 03/27/2023 0-2/hpf    Squamous Epithelial / HPF 03/27/2023 Few(5-10/hpf) (A)    Bacteria, UA 03/27/2023 Few(10-50/hpf) (A)    Sodium 03/27/2023 142    Potassium 03/27/2023 3.6    Chloride 03/27/2023 106    CO2 03/27/2023 30    Glucose, Bld 03/27/2023 83    BUN 03/27/2023 18    Creatinine, Ser 03/27/2023 0.87    GFR  03/27/2023 63.88    Calcium  03/27/2023 9.1    Vitamin B-12 03/27/2023 882    VITD 03/27/2023 20.96 (L)    No image results found.   DG Bone Density Result Date: 11/26/2023 EXAM: DUAL X-RAY ABSORPTIOMETRY (DXA) FOR BONE MINERAL DENSITY 11/26/2023 2:54 pm CLINICAL DATA:  78 year old Female Postmenopausal. TECHNIQUE: An axial (e.g., hips, spine) and/or appendicular (e.g., radius) exam was performed, as appropriate, using GE Secretary/administrator at [employer]. Images are obtained for bone mineral density measurement and are not obtained for diagnostic purposes. [id number] Exclusions: None. COMPARISON:  None. New baseline. FINDINGS: Scan quality: Good. LUMBAR SPINE (L1-L4): BMD (in g/cm2): 1.364 T-score: 1.4 Z-score: 1.8 LEFT FEMORAL NECK: BMD (in g/cm2): 0.908 T-score: -0.9 Z-score: -0.2 LEFT TOTAL HIP: BMD (in g/cm2): 0.924 T-score: -0.7 Z-score: -0.2 RIGHT FEMORAL NECK: BMD (in g/cm2): 0.921 T-score: -0.8 Z-score: -0.1 RIGHT TOTAL HIP: BMD (in g/cm2): 0.911 T-score: -0.8 Z-score: -0.3 FRAX 10-YEAR PROBABILITY OF FRACTURE: FRAX not reported as the lowest BMD is not in the osteopenia range. IMPRESSION: Normal based on BMD. Fracture risk is not increased. RECOMMENDATIONS: 1. All patients should optimize calcium  and vitamin D  intake. 2. Consider FDA-approved medical therapies in postmenopausal women and men aged 22 years and older, based on the following: - A hip or vertebral (clinical or morphometric) fracture - T-score less than or equal to -2.5 and secondary causes have been excluded. - Low bone mass (T-score between -1.0 and -2.5) and a 10-year probability of a hip fracture greater than or equal to 3% or a 10-year probability of a major osteoporosis-related fracture greater than or equal to 20% based on the [place] -adapted WHO algorithm. - Clinician judgment and/or patient preferences may indicate treatment for people with 10-year fracture probabilities above or below these levels 3.  Patients with diagnosis of osteoporosis or at high risk for fracture should have regular bone mineral density tests. For patients eligible for Medicare, routine testing is allowed once every 2 years. The testing frequency can be increased to one year for patients who have rapidly progressing disease, those who are receiving or discontinuing medical therapy to restore bone mass, or have additional risk factors. Electronically Signed   By: Reyes Phi M.D.   On: 11/26/2023 15:44       ASSESSMENT & PLAN   Assessment & Plan Encounter to establish care Reviewed and updated problem list with patient assistance Other headache syndrome Chronic migraine without aura with status migrainosus, not intractable She experiences chronic headaches with pressure sensation, photophobia, and occasional tenderness over the eye, suggesting migraines. However, due to her age and new onset of symptoms, intracranial pathology is a differential consideration. There is no prior  history of migraines, and possible dehydration may contribute to her symptoms. An MRI of the brain is ordered to rule out intracranial pathology. She is prescribed migraine medication and advised to monitor hydration status and fluid intake based on urine output. Prescribed Nurtec, wrote prior authorization support documents: Dear Prior Authorization Review Team,  I am writing to request prior authorization for [ubrogepant (Ubrelvy)/rimegepant (Nurtec ODT)/zavegepant (Zavzpret)] for the acute treatment of migraine in my 78 year old patient with new-onset migraine-type headaches.  Clinical History and Diagnosis:  This patient presents with new-onset migraine headaches characterized by right temporoparietal and periorbital pain with positional worsening. The patient has an extensive medical history including:  - Coronary artery disease (CAD)  - Peripheral vascular disease (PVD)  - Type 2 diabetes mellitus  - Essential hypertension  -  Hyperlipidemia  - Chronic kidney disease, stage 2  The patient has been evaluated by ophthalmology with no concerning findings. Given the new-onset nature of these headaches in an older adult, neuroimaging has been ordered to exclude secondary causes.  Medical Necessity and Rationale:  Gepants represent the most appropriate acute migraine treatment option for this patient due to the following evidence-based considerations:  1. Contraindication to Triptans: Triptans are contraindicated in patients with established coronary artery disease, peripheral artery disease, or multiple vascular risk factors due to their vasoconstrictive properties. This patient has documented CAD and PVD, making triptans unsafe regardless of diagnostic uncertainty.[1][2]  2. Gepants as Non-Vasoconstrictive Migraine-Specific Therapy: Gepants (ubrogepant, rimegepant, and zavegepant) are FDA-approved for acute treatment of migraine with or without aura in adults. Unlike triptans, gepants are not vasoconstrictors and do not carry the same cardiovascular contraindications or require the same precautions. This makes them particularly valuable for older adults with cardiovascular comorbidities.[3][1]  3. Evidence in Older Adults: Clinical evidence supports the use of gepants in older adults with migraine. These medications are well-tolerated and efficacious, with no evidence of vasoconstriction or significant cardiovascular adverse effects. Recent literature specifically identifies gepants as promising treatment options for older adults, albeit with consideration of potential drug interactions via CYP3A4 metabolism.[1][2]  4. Cardiovascular Safety Profile: Recent studies have demonstrated favorable cardiovascular safety profiles for CGRP-targeted therapies, including gepants, particularly when compared to traditional treatments in patients with vascular risk factors. The absence of vasoconstrictive effects makes gepants the preferred  migraine-specific therapy for patients with established cardiovascular disease.[4][5]  5. American Headache Society Recommendations: The American Headache Society consensus statement supports the use of gepants for patients in whom triptans are contraindicated or ineffective, recognizing their role as evidence-based migraine-specific treatments.[6]  Alternative Treatments Considered:  - NSAIDs: While NSAIDs can be effective for migraine, they require caution in this patient given stage 2 CKD and may be contraindicated depending on cardiac function given documented CAD.[1]  - Acetaminophen : Has demonstrated efficacy but with more modest effect sizes compared to migraine-specific therapies.[1]  - Triptans: Absolutely contraindicated due to CAD and PVD.[2]  Requested Medication and Dosing:  I am requesting authorization for [specific gepant medication] at the FDA-approved dosing for acute migraine treatment:  - Rimegepant (Nurtec ODT): 75 mg orally disintegrating tablet as needed for migraine attacks   Conclusion:  Given this patient's cardiovascular contraindications to triptans, advanced age, and multiple comorbidities, gepants represent the safest and most appropriate migraine-specific therapy. The evidence strongly supports their use in older adults with cardiovascular disease, and they are specifically recommended when triptans are contraindicated.[1][2][6]  I respectfully request approval of this medication as medically necessary for this patient's care. Please contact me if you require any  additional clinical information.    References Migraine in Older Adults. Hugger SS, Do TP, Ashina H, et al. The Lancet. Neurology. 2023;22(10):934-945. doi:10.1016/S1474-4422(23)00206-5. Migraine: Integrated Approaches to Clinical Management and Emerging Treatments. Dianna HERO, Buse DC, Ashina H, et al. Lancet [place], [place]). 2021;397(10283):1505-1518. doi:10.1016/S0140-6736(20)32342-4. FDA  Orange Book. FDA Orange Book. Gepants: Targeting the CGRP Pathway for Migraine Relief. Jakubowska B, Sowa-Ku?ma M. Frontiers in Pharmacology. 2025;16:1708226. doi:10.3389/fphar.2025.1708226. CGRP-Targeted Migraine Therapies in Patients With Vascular Risk Factors or Stroke: A Review. Eller MT, Schwarzov K, Gufler L, et al. Neurology. 2025;105(2):e213852. doi:10.1212/WNL.9999999999786147. The American Headache Society Consensus Statement: Update on Integrating New Migraine Treatments Into Clinical Practice. Ailani J, Burch RC, Robbins MS. Headache. 2021;61(7):1021-1039. doi:10.1111/head.14153.  Nuclear sclerotic cataract of both eyes Nuclear sclerotic cataract of both eyes   She has bilateral nuclear sclerotic cataracts, with the right eye more affected. Vision is currently adequate, so there is no immediate need for surgical intervention. The cataracts are unlikely to be causing her headaches. Cataract progression will be monitored, and the need for surgery will be reassessed if vision deteriorates. Essential hypertension Reviewed available data from patient and  BP Readings from Last 3 Encounters:  02/18/24 (!) 142/82  12/11/23 126/80  09/05/23 120/76   My individualized, goal average blood pressure for this patient, after considering the evidence for and against aggressive blood pressure goals as well as their past medical history and preferences, is 130/80 In my medical opinion, this problem is stable, inadequately controlled  We discussed the importance of medication adherence for managing this condition, and the risks of leaving it suboptimally managed.  The patient acknowledged the information presented but respectfully declined to take medications at this time.  We emphasized the importance of contacting us  if their condition worsens or they change their mind about medication. She had run out of amlodipine  but we refilled it and that should resolve the issue. Diabetes mellitus without  complication (HCC) Lab Results  Component Value Date   HGBA1C 6.1 12/11/2023   HGBA1C 6.0 08/20/2023   HGBA1C 6.2 03/27/2023  Well-controlled long term recent Hemoglobin A1c check confirmed  Neck stiffness This is a long standing issue She is working with physical therapy However no imaging is done yet Discussed, we will order and obtain radiographic imaging to further evaluate  Pure hypercholesterolemia She recently started a new cholesterol medication on October 22nd, with no adverse effects noted. Headaches could potentially be a side effect. She remains motivated to manage her cholesterol levels and will continue the current medication regimen. Statin intolerance She takes Zetia  only, after many statin trials Lab Results  Component Value Date   CHOL 180 02/18/2024   CHOL 204 (H) 12/11/2023   HDL 52.30 02/18/2024   HDL 57.20 12/11/2023   CHOLHDL 3 02/18/2024   CHOLHDL 4 12/11/2023   LDLCALC 110 (H) 02/18/2024   LDLCALC 128 (H) 12/11/2023   LDLDIRECT 134.6 05/23/2010   TRIG 87.0 02/18/2024   TRIG 93.0 12/11/2023   VLDL 17.4 02/18/2024   VLDL 18.6 12/11/2023   The 89-bzjm ASCVD risk score (Arnett DK, et al., 2019) is: 38.3%   Values used to calculate the score:     Age: 23 years     Clinically relevant sex: Female     Is Non-Hispanic African American: Yes     Diabetic: Yes     Tobacco smoker: No     Systolic Blood Pressure: 142 mmHg     Is BP treated: Yes     HDL Cholesterol: 52.3 mg/dL  Total Cholesterol: 180 mg/dL She is on limited income; nonetheless will just check on Repatha  given the arteriosclerotic cardiovascular disease risk. Shared decision-making done; patient understood rationale and agreed to [facility] to see how Zetia  doing. Proteinuria, unspecified type Polyuria Shared decision-making done; patient understood rationale and agreed to [facility]    ORDER ASSOCIATIONS  #   DIAGNOSIS / CONDITION ICD-10 ENCOUNTER ORDER     ICD-10-CM   1. Encounter to  establish care  Z76.89     2. Nuclear sclerotic cataract of both eyes  H25.13     3. Other headache syndrome  G44.89 MR BRAIN W WO CONTRAST    4. Chronic migraine without aura with status migrainosus, not intractable  G43.701 Rimegepant Sulfate (NURTEC) 75 MG TBDP    5. Essential hypertension  I10 amLODipine  (NORVASC ) 10 MG tablet    6. Iron deficiency anemia, unspecified iron deficiency anemia type  D50.9     7. Diabetes mellitus without complication (HCC)  E11.9     8. Neck stiffness  M43.6 DG Cervical Spine Complete     ED Discharge Orders          Ordered    amLODipine  (NORVASC ) 10 MG tablet  Daily        02/18/24 1103    MR BRAIN W WO CONTRAST       Comments: The right side-locked nature of this headache is particularly concerning in a 78 year old with new-onset symptoms. Side-locked headache (unilateral headache invariably on the same side) is a red flag that should raise concern for secondary causes, especially in older adults where the prevalence of secondary headaches increases. [5] The combination of new-onset headache after age 87, side-locked presentation, and positional worsening with specific head positioning creates a clinical picture that demands exclusion of structural pathology.  Possible mechanisms for position-dependent worsening include:  Mass lesion (tumor, metastasis) causing traction on pain-sensitive structures when gravity shifts the brain  Subdural hematoma with positional pressure effects  Vascular malformation with positional venous congestion  Increased intracranial pressure from various causes   02/18/24 1103    Rimegepant Sulfate (NURTEC) 75 MG TBDP  Daily       Note to Pharmacy: Prior authorization submitted.   02/18/24 1103    DG Cervical Spine Complete        02/18/24 1115          Diagnoses and all orders for this visit: Encounter to establish care Nuclear sclerotic cataract of both eyes Other headache syndrome -     MR BRAIN W WO  CONTRAST; Future Chronic migraine without aura with status migrainosus, not intractable -     Rimegepant Sulfate (NURTEC) 75 MG TBDP; Take 1 tablet (75 mg total) by mouth daily at 6 (six) AM. Essential hypertension -     amLODipine  (NORVASC ) 10 MG tablet; Take 1 tablet (10 mg total) by mouth daily. Iron deficiency anemia, unspecified iron deficiency anemia type Diabetes mellitus without complication (HCC) Neck stiffness -     DG Cervical Spine Complete; Future  Recommended follow up: Return in about 3 weeks (around 03/10/2024). Future Appointments  Date Time Provider Department Center  03/20/2024  8:50 AM [facility]-ANNUAL WELLNESS VISIT 2 [facility] [place]  04/29/2024 11:15 AM Alm Delon SAILOR, DO [facility] None        Additional notes: This document was synthesized by artificial intelligence (Abridge) using HIPAA-compliant recording of the clinical interaction;   We discussed the use of AI scribe software for clinical note transcription with the patient,  who gave verbal consent to proceed.    Additional Info: This encounter employed state-of-the-art, real-time, collaborative documentation. The patient actively reviewed and assisted in updating their electronic medical record on a shared screen, ensuring transparency and facilitating joint problem-solving for the problem list, overview, and plan. This approach promotes accurate, informed care. The treatment plan was discussed and reviewed in detail, including medication safety, potential side effects, and all patient questions. We confirmed understanding and comfort with the plan. Follow-up instructions were established, including contacting the office for any concerns, returning if symptoms worsen, persist, or new symptoms develop, and precautions for potential emergency department visits.  Initial Appointment Goals:  This initial visit focused on establishing a foundation for the patient's care. We collaboratively reviewed her medical  history and medications in detail, updating the chart as shown in the encounter. Given the extensive information, we prioritized addressing her most pressing concerns, which she reported were: New PT (Pt is present to est care with pcp) and Headache (On the right side would like to know if her eye could have something to do with it.)  While the complexity of the patient's medical picture may necessitate further evaluation in subsequent visits, we were able to develop a preliminary care plan together. To expedite a comprehensive plan at the next visit, we encouraged the patient to gather relevant medical records from previous providers. This collaborative approach will ensure a more complete understanding of the patient's health and inform the development of a personalized care plan. We look forward to continuing the conversation and working together with the patient on achieving her health goals.   Collaborative Documentation:  Today's encounter utilized real-time, dynamic patient engagement.  Patients actively participate by directly reviewing and assisting in updating their medical records through a shared screen. This transparency empowers patients to visually confirm chart updates made by the healthcare provider.  This collaborative approach facilitates problem management as we jointly update the problem list, problem overview, and assessment/plan. Ultimately, this process enhances chart accuracy and completeness, fostering shared decision-making, patient education, and informed consent for tests and treatments.  Collaborative Treatment Planning:  Treatment plans were discussed and reviewed in detail.  Explained medication safety and potential side effects.  Encouraged participation and answered all patient questions, confirming understanding and comfort with the plan. Encouraged patient to contact our office if they have any questions or concerns. Agreed on patient returning to office if symptoms worsen,  persist, or new symptoms develop.  ----------------------------------------------------- Bernardino KANDICE Cone, MD  02/18/2024 11:15 AM  [facility]:  [phone]       [1]  Current Meds  Medication Sig   albuterol  (VENTOLIN  HFA) 108 (90 Base) MCG/ACT inhaler TAKE 2 PUFFS BY MOUTH EVERY 6 HOURS AS NEEDED FOR WHEEZE   aspirin  81 MG EC tablet TAKE 1 TABLET BY MOUTH DAILY. SWALLOW WHOLE.   calcium  carbonate (OSCAL) 1500 (600 Ca) MG TABS tablet Take by mouth 2 (two) times daily with a meal. (Patient taking differently: Take by mouth 2 (two) times daily with a meal. Has magnesium and zinc with d3)   chlorhexidine  (PERIDEX ) 0.12 % solution USE AS DIRECTED   Cholecalciferol  50 MCG (2000 UT) TABS 1 tab by mouth once daily   cyclobenzaprine  (FLEXERIL ) 5 MG tablet Take 5 mg by mouth 3 (three) times daily as needed for muscle spasms.   ezetimibe  (ZETIA ) 10 MG tablet Take 1 tablet (10 mg total) by mouth daily.   furosemide  (LASIX ) 20 MG tablet  Take 1 tablet (20 mg total) by mouth daily as needed.   gabapentin  (NEURONTIN ) 300 MG capsule TAKE 1 CAPSULE BY MOUTH THREE TIMES A DAY (Patient taking differently: Taking as needed)   losartan  (COZAAR ) 25 MG tablet TAKE 1 TABLET (25 MG TOTAL) BY MOUTH DAILY.   meclizine  (ANTIVERT ) 12.5 MG tablet TAKE 1 TABLET BY MOUTH 3 TIMES DAILY AS NEEDED.   Misc Natural Products (CRANBERRY/PROBIOTIC PO) Take by mouth.   Multiple Vitamins-Minerals (WOMENS MULTIVITAMIN PO) Take 1 tablet by mouth daily.   OneTouch Delica Lancets 33G MISC 1 each by Does not apply route 2 (two) times daily. Use to check blood sugars twice a day Dx E11.9   ONETOUCH VERIO test strip USE 2 (TWO) TIMES DAILY. DX E11.9   pentoxifylline  (TRENTAL ) 400 MG CR tablet TAKE 1 TABLET BY MOUTH 3 TIMES DAILY WITH MEALS. (Patient taking differently: Taking as needed)   pioglitazone  (ACTOS ) 15 MG tablet TAKE 1 TABLET BY MOUTH EVERY DAY   potassium chloride  (KLOR-CON ) 10 MEQ  tablet TAKE 1 TABLET BY MOUTH EVERY DAY   Rimegepant Sulfate (NURTEC) 75 MG TBDP Take 1 tablet (75 mg total) by mouth daily at 6 (six) AM.   tacrolimus  (PROTOPIC ) 0.1 % ointment APPLY TOPICALLY TWICE A DAY   vitamin B-12 (CYANOCOBALAMIN ) 1000 MCG tablet 1 tab by mouth mon - wed - thur   [DISCONTINUED] amLODipine  (NORVASC ) 10 MG tablet TAKE 1 TABLET BY MOUTH EVERY DAY   "

## 2024-02-18 NOTE — Assessment & Plan Note (Signed)
 This is a long standing issue She is working with physical therapy However no imaging is done yet Discussed, we will order and obtain radiographic imaging to further evaluate

## 2024-02-18 NOTE — Assessment & Plan Note (Signed)
 Nuclear sclerotic cataract of both eyes   She has bilateral nuclear sclerotic cataracts, with the right eye more affected. Vision is currently adequate, so there is no immediate need for surgical intervention. The cataracts are unlikely to be causing her headaches. Cataract progression will be monitored, and the need for surgery will be reassessed if vision deteriorates.

## 2024-02-18 NOTE — Patient Instructions (Addendum)
 It was a pleasure seeing you today! Your health and satisfaction are our top priorities.  Bernardino Cone, MD  VISIT SUMMARY: Today, we discussed your ongoing headaches and concerns about cataracts. We also reviewed your new cholesterol medication and hydration efforts.  YOUR PLAN: -CHRONIC MIGRAINE: Chronic migraines are recurring headaches that can cause severe pain and sensitivity to light. We have prescribed migraine medication and ordered an MRI of your brain to rule out any serious issues. Please continue to monitor your hydration by checking your urine output and skin turgor.  -NUCLEAR SCLEROTIC CATARACT OF BOTH EYES: Cataracts are a clouding of the eye's lens, which can affect vision. Your vision is currently adequate, so surgery is not needed right now. We will monitor the cataracts and reassess if your vision gets worse.  -TYPE 2 DIABETES MELLITUS: Type 2 diabetes is a condition where your body does not use insulin properly, leading to high blood sugar levels. We will continue to monitor and manage your diabetes.  -PURE HYPERCHOLESTEROLEMIA: Hypercholesterolemia means having high cholesterol levels in the blood. You recently started a new cholesterol medication, and we will continue with this treatment to manage your cholesterol levels.  INSTRUCTIONS: Please follow up with the MRI of your brain as ordered. Continue taking your migraine medication as prescribed and monitor your hydration status. We will reassess your cataracts and cholesterol levels at your next visit.  Your Providers PCP: Cone Bernardino MATSU, MD,  [phone]) Referring Provider: Norleen Lynwood ORN, MD,  [phone]) Care Team Provider: Claudene Arthea HERO, DO,  [phone]) Care Team Provider: Gaynel Delon CROME, [place],  [phone]) Care Team Provider: Elner Arley LABOR, MD,  [phone]) Care Team Provider: Paul Barrio, [place],  [phone])  NEXT STEPS: [x]  Early Intervention: Schedule sooner appointment, call  our on-call services, or go to emergency room if there is any significant Increase in pain or discomfort New or worsening symptoms Sudden or severe changes in your health [x]  Flexible Follow-Up: We recommend a Return in about 3 weeks (around 03/10/2024). for optimal routine care. This allows for progress monitoring and treatment adjustments. [x]  Preventive Care: Schedule your annual preventive care visit! It's typically covered by insurance and helps identify potential health issues early. [x]  Lab & X-ray Appointments: Incomplete tests scheduled today, or call to schedule. X-rays: [facility] (M-F, 8:30am-noon or 1pm-5pm). [x]  Medical Information Release: Sign a release form at front desk to obtain relevant medical information we don't have.  MAKING THE MOST OF OUR FOCUSED 20 MINUTE APPOINTMENTS: [x]   Clearly state your top concerns at the beginning of the visit to focus our discussion [x]   If you anticipate you will need more time, please inform the front desk during scheduling - we can book multiple appointments in the same week. [x]   If you have transportation problems- use our convenient video appointments or ask about transportation support. [x]   We can get down to business faster if you use MyChart to update information before the visit and submit non-urgent questions before your visit. Thank you for taking the time to provide details through MyChart.  Let our nurse know and she can import this information into your encounter documents.  Arrival and Wait Times: [x]   Arriving on time ensures that everyone receives prompt attention. [x]   Early morning (8a) and afternoon (1p) appointments tend to have shortest wait times. [x]   Unfortunately, we cannot delay appointments for late arrivals or hold slots during phone calls.  Getting Answers and Following Up [x]   Simple Questions &  Concerns: For quick questions or basic follow-up after your visit, reach us  at [phone] or  MyChart messaging. [x]   Complex Concerns: If your concern is more complex, scheduling an appointment might be best. Discuss this with the staff to find the most suitable option. [x]   Lab & Imaging Results: We'll contact you directly if results are abnormal or you don't use MyChart. Most normal results will be on MyChart within 2-3 business days, with a review message from Dr. Jesus. Haven't heard back in 2 weeks? Need results sooner? Contact us  at [phone]. [x]   Referrals: Our referral coordinator will manage specialist referrals. The specialist's office should contact you within 2 weeks to schedule an appointment. Call us  if you haven't heard from them after 2 weeks.  Staying Connected [x]   MyChart: Activate your MyChart for the fastest way to access results and message us . See the last page of this paperwork for instructions on how to activate.  Bring to Your Next Appointment [x]   Medications: Please bring all your medication bottles to your next appointment to ensure we have an accurate record of your prescriptions. [x]   Health Diaries: If you're monitoring any health conditions at home, keeping a diary of your readings can be very helpful for discussions at your next appointment.  Billing [x]   X-ray & Lab Orders: These are billed by separate companies. Contact the invoicing company directly for questions or concerns. [x]   Visit Charges: Discuss any billing inquiries with our administrative services team.  Your Satisfaction Matters [x]   Share Your Experience: We strive for your satisfaction! If you have any complaints, or preferably compliments, please let Dr. Jesus know directly or contact our Practice Administrators, Manuelita Rubin or [employer], by asking at the front desk.   Reviewing Your Records [x]   Review this early draft of your clinical encounter notes below and the final encounter summary tomorrow on MyChart after its been completed.  All orders placed so far are  visible here: Other headache syndrome Assessment & Plan: She experiences chronic headaches with pressure sensation, photophobia, and occasional tenderness over the eye, suggesting migraines. However, due to her age and new onset of symptoms, intracranial pathology is a differential consideration. There is no prior history of migraines, and possible dehydration may contribute to her symptoms. An MRI of the brain is ordered to rule out intracranial pathology. She is prescribed migraine medication and advised to monitor hydration status and fluid intake based on urine output. Prescribed Nurtec, wrote prior authorization support documents: Dear Prior Authorization Review Team,  I am writing to request prior authorization for [ubrogepant (Ubrelvy)/rimegepant (Nurtec ODT)/zavegepant (Zavzpret)] for the acute treatment of migraine in my 78 year old patient with new-onset migraine-type headaches.  Clinical History and Diagnosis:  This patient presents with new-onset migraine headaches characterized by right temporoparietal and periorbital pain with positional worsening. The patient has an extensive medical history including:  - Coronary artery disease (CAD)  - Peripheral vascular disease (PVD)  - Type 2 diabetes mellitus  - Essential hypertension  - Hyperlipidemia  - Chronic kidney disease, stage 2  The patient has been evaluated by ophthalmology with no concerning findings. Given the new-onset nature of these headaches in an older adult, neuroimaging has been ordered to exclude secondary causes.  Medical Necessity and Rationale:  Gepants represent the most appropriate acute migraine treatment option for this patient due to the following evidence-based considerations:  1. Contraindication to Triptans: Triptans are contraindicated in patients with established coronary artery disease, peripheral artery disease, or multiple  vascular risk factors due to their vasoconstrictive properties. This  patient has documented CAD and PVD, making triptans unsafe regardless of diagnostic uncertainty.[1][2]  2. Gepants as Non-Vasoconstrictive Migraine-Specific Therapy: Gepants (ubrogepant, rimegepant, and zavegepant) are FDA-approved for acute treatment of migraine with or without aura in adults. Unlike triptans, gepants are not vasoconstrictors and do not carry the same cardiovascular contraindications or require the same precautions. This makes them particularly valuable for older adults with cardiovascular comorbidities.[3][1]  3. Evidence in Older Adults: Clinical evidence supports the use of gepants in older adults with migraine. These medications are well-tolerated and efficacious, with no evidence of vasoconstriction or significant cardiovascular adverse effects. Recent literature specifically identifies gepants as promising treatment options for older adults, albeit with consideration of potential drug interactions via CYP3A4 metabolism.[1][2]  4. Cardiovascular Safety Profile: Recent studies have demonstrated favorable cardiovascular safety profiles for CGRP-targeted therapies, including gepants, particularly when compared to traditional treatments in patients with vascular risk factors. The absence of vasoconstrictive effects makes gepants the preferred migraine-specific therapy for patients with established cardiovascular disease.[4][5]  5. American Headache Society Recommendations: The American Headache Society consensus statement supports the use of gepants for patients in whom triptans are contraindicated or ineffective, recognizing their role as evidence-based migraine-specific treatments.[6]  Alternative Treatments Considered:  - NSAIDs: While NSAIDs can be effective for migraine, they require caution in this patient given stage 2 CKD and may be contraindicated depending on cardiac function given documented CAD.[1]  - Acetaminophen : Has demonstrated efficacy but with more modest effect  sizes compared to migraine-specific therapies.[1]  - Triptans: Absolutely contraindicated due to CAD and PVD.[2]  Requested Medication and Dosing:  I am requesting authorization for [specific gepant medication] at the FDA-approved dosing for acute migraine treatment:  - Rimegepant (Nurtec ODT): 75 mg orally disintegrating tablet as needed for migraine attacks   Conclusion:  Given this patient's cardiovascular contraindications to triptans, advanced age, and multiple comorbidities, gepants represent the safest and most appropriate migraine-specific therapy. The evidence strongly supports their use in older adults with cardiovascular disease, and they are specifically recommended when triptans are contraindicated.[1][2][6]  I respectfully request approval of this medication as medically necessary for this patient's care. Please contact me if you require any additional clinical information.    References Migraine in Older Adults. Hugger SS, Do TP, Ashina H, et al. The Lancet. Neurology. 2023;22(10):934-945. doi:10.1016/S1474-4422(23)00206-5. Migraine: Integrated Approaches to Clinical Management and Emerging Treatments. Dianna HERO, Buse DC, Ashina H, et al. Lancet [place], [place]). 2021;397(10283):1505-1518. doi:10.1016/S0140-6736(20)32342-4. FDA Orange Book. FDA Orange Book. Gepants: Targeting the CGRP Pathway for Migraine Relief. Jakubowska B, Sowa-Ku?ma M. Frontiers in Pharmacology. 2025;16:1708226. doi:10.3389/fphar.2025.1708226. CGRP-Targeted Migraine Therapies in Patients With Vascular Risk Factors or Stroke: A Review. Eller MT, Schwarzov K, Gufler L, et al. Neurology. 2025;105(2):e213852. doi:10.1212/WNL.9999999999786147. The American Headache Society Consensus Statement: Update on Integrating New Migraine Treatments Into Clinical Practice. Ailani J, Burch RC, Robbins MS. Headache. 2021;61(7):1021-1039. doi:10.1111/head.14153.   Orders: -     MR BRAIN W WO CONTRAST; Future -      Sedimentation rate -     High sensitivity CRP  Encounter to establish care  Chronic migraine without aura with status migrainosus, not intractable Assessment & Plan: She experiences chronic headaches with pressure sensation, photophobia, and occasional tenderness over the eye, suggesting migraines. However, due to her age and new onset of symptoms, intracranial pathology is a differential consideration. There is no prior history of migraines, and possible dehydration may contribute to her symptoms. An MRI of the brain is ordered  to rule out intracranial pathology. She is prescribed migraine medication and advised to monitor hydration status and fluid intake based on urine output. Prescribed Nurtec, wrote prior authorization support documents: Dear Prior Authorization Review Team,  I am writing to request prior authorization for [ubrogepant (Ubrelvy)/rimegepant (Nurtec ODT)/zavegepant (Zavzpret)] for the acute treatment of migraine in my 78 year old patient with new-onset migraine-type headaches.  Clinical History and Diagnosis:  This patient presents with new-onset migraine headaches characterized by right temporoparietal and periorbital pain with positional worsening. The patient has an extensive medical history including:  - Coronary artery disease (CAD)  - Peripheral vascular disease (PVD)  - Type 2 diabetes mellitus  - Essential hypertension  - Hyperlipidemia  - Chronic kidney disease, stage 2  The patient has been evaluated by ophthalmology with no concerning findings. Given the new-onset nature of these headaches in an older adult, neuroimaging has been ordered to exclude secondary causes.  Medical Necessity and Rationale:  Gepants represent the most appropriate acute migraine treatment option for this patient due to the following evidence-based considerations:  1. Contraindication to Triptans: Triptans are contraindicated in patients with established coronary artery disease,  peripheral artery disease, or multiple vascular risk factors due to their vasoconstrictive properties. This patient has documented CAD and PVD, making triptans unsafe regardless of diagnostic uncertainty.[1][2]  2. Gepants as Non-Vasoconstrictive Migraine-Specific Therapy: Gepants (ubrogepant, rimegepant, and zavegepant) are FDA-approved for acute treatment of migraine with or without aura in adults. Unlike triptans, gepants are not vasoconstrictors and do not carry the same cardiovascular contraindications or require the same precautions. This makes them particularly valuable for older adults with cardiovascular comorbidities.[3][1]  3. Evidence in Older Adults: Clinical evidence supports the use of gepants in older adults with migraine. These medications are well-tolerated and efficacious, with no evidence of vasoconstriction or significant cardiovascular adverse effects. Recent literature specifically identifies gepants as promising treatment options for older adults, albeit with consideration of potential drug interactions via CYP3A4 metabolism.[1][2]  4. Cardiovascular Safety Profile: Recent studies have demonstrated favorable cardiovascular safety profiles for CGRP-targeted therapies, including gepants, particularly when compared to traditional treatments in patients with vascular risk factors. The absence of vasoconstrictive effects makes gepants the preferred migraine-specific therapy for patients with established cardiovascular disease.[4][5]  5. American Headache Society Recommendations: The American Headache Society consensus statement supports the use of gepants for patients in whom triptans are contraindicated or ineffective, recognizing their role as evidence-based migraine-specific treatments.[6]  Alternative Treatments Considered:  - NSAIDs: While NSAIDs can be effective for migraine, they require caution in this patient given stage 2 CKD and may be contraindicated depending on cardiac  function given documented CAD.[1]  - Acetaminophen : Has demonstrated efficacy but with more modest effect sizes compared to migraine-specific therapies.[1]  - Triptans: Absolutely contraindicated due to CAD and PVD.[2]  Requested Medication and Dosing:  I am requesting authorization for [specific gepant medication] at the FDA-approved dosing for acute migraine treatment:  - Rimegepant (Nurtec ODT): 75 mg orally disintegrating tablet as needed for migraine attacks   Conclusion:  Given this patient's cardiovascular contraindications to triptans, advanced age, and multiple comorbidities, gepants represent the safest and most appropriate migraine-specific therapy. The evidence strongly supports their use in older adults with cardiovascular disease, and they are specifically recommended when triptans are contraindicated.[1][2][6]  I respectfully request approval of this medication as medically necessary for this patient's care. Please contact me if you require any additional clinical information.    References Migraine in Older Adults. Hugger SS, Do TP, Ashina H, et  al. The Lancet. Neurology. 2023;22(10):934-945. doi:10.1016/S1474-4422(23)00206-5. Migraine: Integrated Approaches to Clinical Management and Emerging Treatments. Dianna HERO, Buse DC, Ashina H, et al. Lancet [place], [place]). 2021;397(10283):1505-1518. doi:10.1016/S0140-6736(20)32342-4. FDA Orange Book. FDA Orange Book. Gepants: Targeting the CGRP Pathway for Migraine Relief. Jakubowska B, Sowa-Ku?ma M. Frontiers in Pharmacology. 2025;16:1708226. doi:10.3389/fphar.2025.1708226. CGRP-Targeted Migraine Therapies in Patients With Vascular Risk Factors or Stroke: A Review. Eller MT, Schwarzov K, Gufler L, et al. Neurology. 2025;105(2):e213852. doi:10.1212/WNL.9999999999786147. The American Headache Society Consensus Statement: Update on Integrating New Migraine Treatments Into Clinical Practice. Ailani J, Burch RC, Robbins MS. Headache.  2021;61(7):1021-1039. doi:10.1111/head.14153.   Orders: -     Nurtec; Take 1 tablet (75 mg total) by mouth daily at 6 (six) AM.  Dispense: 10 tablet; Refill: 2  Nuclear sclerotic cataract of both eyes Assessment & Plan: Nuclear sclerotic cataract of both eyes   She has bilateral nuclear sclerotic cataracts, with the right eye more affected. Vision is currently adequate, so there is no immediate need for surgical intervention. The cataracts are unlikely to be causing her headaches. Cataract progression will be monitored, and the need for surgery will be reassessed if vision deteriorates.   Essential hypertension Assessment & Plan: Reviewed available data from patient and  BP Readings from Last 3 Encounters:  02/18/24 (!) 142/82  12/11/23 126/80  09/05/23 120/76   My individualized, goal average blood pressure for this patient, after considering the evidence for and against aggressive blood pressure goals as well as their past medical history and preferences, is 130/80 In my medical opinion, this problem is stable, inadequately controlled  We discussed the importance of medication adherence for managing this condition, and the risks of leaving it suboptimally managed.  The patient acknowledged the information presented but respectfully declined to take medications at this time.  We emphasized the importance of contacting us  if their condition worsens or they change their mind about medication. She had run out of amlodipine  but we refilled it and that should resolve the issue.  Orders: -     amLODIPine  Besylate; Take 1 tablet (10 mg total) by mouth daily.  Dispense: 90 tablet; Refill: 3  Diabetes mellitus without complication (HCC) -     Accu-Chek Guide Me; 1 each by Does not apply route daily.  Dispense: 1 kit; Refill: 5 -     Microalbumin / creatinine urine ratio -     Repatha  SureClick; Inject 140 mg into the skin every 14 (fourteen) days.  Dispense: 2 mL; Refill: 2  Neck  stiffness Assessment & Plan: This is a long standing issue She is working with physical therapy However no imaging is done yet Discussed, we will order and obtain radiographic imaging to further evaluate   Orders: -     DG Cervical Spine Complete; Future  Pure hypercholesterolemia Assessment & Plan: She recently started a new cholesterol medication on October 22nd, with no adverse effects noted. Headaches could potentially be a side effect. She remains motivated to manage her cholesterol levels and will continue the current medication regimen.  Orders: -     Lipid panel -     Repatha  SureClick; Inject 140 mg into the skin every 14 (fourteen) days.  Dispense: 2 mL; Refill: 2  Statin intolerance Assessment & Plan: She takes Zetia  only, after many statin trials Lab Results  Component Value Date   CHOL 180 02/18/2024   CHOL 204 (H) 12/11/2023   HDL 52.30 02/18/2024   HDL 57.20 12/11/2023   CHOLHDL 3 02/18/2024   CHOLHDL 4 12/11/2023  LDLCALC 110 (H) 02/18/2024   LDLCALC 128 (H) 12/11/2023   LDLDIRECT 134.6 05/23/2010   TRIG 87.0 02/18/2024   TRIG 93.0 12/11/2023   VLDL 17.4 02/18/2024   VLDL 18.6 12/11/2023   The 89-bzjm ASCVD risk score (Arnett DK, et al., 2019) is: 38.3%   Values used to calculate the score:     Age: 51 years     Clinically relevant sex: Female     Is Non-Hispanic African American: Yes     Diabetic: Yes     Tobacco smoker: No     Systolic Blood Pressure: 142 mmHg     Is BP treated: Yes     HDL Cholesterol: 52.3 mg/dL     Total Cholesterol: 180 mg/dL She is on limited income; nonetheless will just check on Repatha  given the arteriosclerotic cardiovascular disease risk. Shared decision-making done; patient understood rationale and agreed to [facility] to see how Zetia  doing.  Orders: -     Repatha  SureClick; Inject 140 mg into the skin every 14 (fourteen) days.  Dispense: 2 mL; Refill: 2  Proteinuria, unspecified type -     Urinalysis, Routine w  reflex microscopic; Future  Polyuria -     Microalbumin / creatinine urine ratio -     Urinalysis, Routine w reflex microscopic

## 2024-02-18 NOTE — Assessment & Plan Note (Signed)
 She takes Zetia  only, after many statin trials Lab Results  Component Value Date   CHOL 180 02/18/2024   CHOL 204 (H) 12/11/2023   HDL 52.30 02/18/2024   HDL 57.20 12/11/2023   CHOLHDL 3 02/18/2024   CHOLHDL 4 12/11/2023   LDLCALC 110 (H) 02/18/2024   LDLCALC 128 (H) 12/11/2023   LDLDIRECT 134.6 05/23/2010   TRIG 87.0 02/18/2024   TRIG 93.0 12/11/2023   VLDL 17.4 02/18/2024   VLDL 18.6 12/11/2023   The 89-bzjm ASCVD risk score (Arnett DK, et al., 2019) is: 38.3%   Values used to calculate the score:     Age: 78 years     Clinically relevant sex: Female     Is Non-Hispanic African American: Yes     Diabetic: Yes     Tobacco smoker: No     Systolic Blood Pressure: 142 mmHg     Is BP treated: Yes     HDL Cholesterol: 52.3 mg/dL     Total Cholesterol: 180 mg/dL She is on limited income; nonetheless will just check on Repatha  given the arteriosclerotic cardiovascular disease risk. Shared decision-making done; patient understood rationale and agreed to [facility] to see how Zetia  doing.

## 2024-02-19 ENCOUNTER — Telehealth: Payer: Self-pay

## 2024-02-19 ENCOUNTER — Ambulatory Visit: Payer: Self-pay | Admitting: Internal Medicine

## 2024-02-19 ENCOUNTER — Other Ambulatory Visit (HOSPITAL_COMMUNITY): Payer: Self-pay

## 2024-02-19 DIAGNOSIS — R9082 White matter disease, unspecified: Secondary | ICD-10-CM

## 2024-02-19 NOTE — Telephone Encounter (Signed)
 Pharmacy Patient Advocate Encounter   Received notification from Onbase that prior authorization for Repatha  SureClick 140MG /ML auto-injectors is required/requested.   Insurance verification completed.   The patient is insured through [facility].   Per test claim: PA required; PA submitted to above mentioned insurance via Latent Key/confirmation #/EOC [id number] Status is pending

## 2024-02-19 NOTE — Telephone Encounter (Signed)
 Pharmacy Patient Advocate Encounter  Received notification from [facility] that Prior Authorization for Nurtec 75MG  dispersible tablets  has been APPROVED from 11/21/23 to 02/18/25

## 2024-02-19 NOTE — Telephone Encounter (Signed)
 Pharmacy Patient Advocate Encounter  Received notification from [facility] that Prior Authorization for Cyclobenzaprine  5mg  tabs has been APPROVED from 02/19/24 to 05/19/24   PA #/Case ID/Reference #: [id number]

## 2024-02-19 NOTE — Telephone Encounter (Signed)
 Pharmacy Patient Advocate Encounter   Received notification from Physician's Office that prior authorization for Nurtec 75MG  dispersible tablets is required/requested.   Insurance verification completed.   The patient is insured through [facility].   Per test claim: PA required; PA submitted to above mentioned insurance via Phone Key/confirmation #/EOC --- Status is pending

## 2024-02-19 NOTE — Telephone Encounter (Signed)
 Pharmacy Patient Advocate Encounter   Received notification from CoverMyMeds that prior authorization for Cyclobenzaprine  5mg  tabs is required/requested.   Insurance verification completed.   The patient is insured through [facility].   Per test claim: PA required; PA submitted to above mentioned insurance via Latent Key/confirmation #/EOC [id number] Status is pending

## 2024-02-21 ENCOUNTER — Ambulatory Visit (HOSPITAL_COMMUNITY)
Admission: RE | Admit: 2024-02-21 | Discharge: 2024-02-21 | Disposition: A | Discharge status: Home / Self Care | Source: Ambulatory Visit | Attending: Internal Medicine | Admitting: Internal Medicine

## 2024-02-21 ENCOUNTER — Other Ambulatory Visit (HOSPITAL_COMMUNITY): Payer: Self-pay

## 2024-02-21 ENCOUNTER — Telehealth: Payer: Self-pay | Admitting: Internal Medicine

## 2024-02-21 DIAGNOSIS — G4489 Other headache syndrome: Secondary | ICD-10-CM | POA: Diagnosis present

## 2024-02-21 MED ORDER — GADOBUTROL 1 MMOL/ML IV SOLN
8.0000 mL | Freq: Once | INTRAVENOUS | Status: AC | PRN
  Administered 2024-02-21: 8 mL via INTRAVENOUS

## 2024-02-21 NOTE — Telephone Encounter (Signed)
 Sent to lisa referral team to change where pt is going

## 2024-02-21 NOTE — Telephone Encounter (Signed)
 Please advise.  Copied from CRM [id number]. Topic: Referral - Status >> Feb 21, 2024 10:20 AM Alfonso ORN wrote: Reason for CRM: Imaging place that pt was referred to for MRI told pt that they currently dont have any openings for scheduling and to contact provider for referral to be sent to another place to be seen . Please advise

## 2024-02-21 NOTE — Telephone Encounter (Signed)
 Pharmacy Patient Advocate Encounter  Received notification from [facility] that Prior Authorization for Repatha  SureClick 140MG /ML auto-injectors  has been APPROVED from 11/21/23 to 02/18/25. Unable to obtain price due to refill too soon rejection, last fill date 02/19/24 next available fill date 04/22/24   PA #/Case ID/Reference #: [id number]

## 2024-02-22 DIAGNOSIS — R9082 White matter disease, unspecified: Secondary | ICD-10-CM | POA: Insufficient documentation

## 2024-02-22 NOTE — Progress Notes (Signed)
 Sent message to patient reviewing

## 2024-02-24 ENCOUNTER — Other Ambulatory Visit (HOSPITAL_COMMUNITY): Payer: Self-pay

## 2024-02-26 ENCOUNTER — Ambulatory Visit

## 2024-02-26 ENCOUNTER — Ambulatory Visit: Payer: Self-pay

## 2024-02-26 NOTE — Telephone Encounter (Signed)
 Called and spoke to patient to review instructions. Patient expressed that she was coming in and someone she spoke to earlier expressed that they would show her how to inject herself.

## 2024-02-26 NOTE — Progress Notes (Signed)
 Patient is in office today for a nurse visit for Medication Education, per PCP's order. Patient was provided with instruction and demonstration on how to Administer Medication Repatha . Printed off educational step by step instructions for patient to take home to reference. Pt states will come back next time injection is due for nurse visit for CMA to supervise her administer next injection to ensure she feels comfortable and does correctly.

## 2024-02-26 NOTE — Telephone Encounter (Signed)
 Message from [place] C sent at 02/26/2024  8:56 AM EST  Reason for Triage: Patient called in regarding prescription Evolocumab  (REPATHA  SURECLICK) 140 MG/ML SOAJ Would like to know what she needs to do to give herself a shot

## 2024-03-02 ENCOUNTER — Other Ambulatory Visit: Payer: Self-pay | Admitting: Internal Medicine

## 2024-03-02 DIAGNOSIS — G43701 Chronic migraine without aura, not intractable, with status migrainosus: Secondary | ICD-10-CM

## 2024-03-10 ENCOUNTER — Ambulatory Visit: Admitting: Internal Medicine

## 2024-03-11 ENCOUNTER — Encounter: Payer: Self-pay | Admitting: Internal Medicine

## 2024-03-11 ENCOUNTER — Ambulatory Visit: Admitting: Internal Medicine

## 2024-03-11 ENCOUNTER — Ambulatory Visit

## 2024-03-11 VITALS — BP 138/80 | HR 82 | Temp 98.0°F | Ht 59.0 in | Wt 182.8 lb

## 2024-03-11 DIAGNOSIS — E119 Type 2 diabetes mellitus without complications: Secondary | ICD-10-CM

## 2024-03-11 DIAGNOSIS — F418 Other specified anxiety disorders: Secondary | ICD-10-CM

## 2024-03-11 DIAGNOSIS — E559 Vitamin D deficiency, unspecified: Secondary | ICD-10-CM

## 2024-03-11 DIAGNOSIS — G43501 Persistent migraine aura without cerebral infarction, not intractable, with status migrainosus: Secondary | ICD-10-CM

## 2024-03-11 DIAGNOSIS — I10 Essential (primary) hypertension: Secondary | ICD-10-CM

## 2024-03-11 DIAGNOSIS — I2510 Atherosclerotic heart disease of native coronary artery without angina pectoris: Secondary | ICD-10-CM

## 2024-03-11 DIAGNOSIS — F439 Reaction to severe stress, unspecified: Secondary | ICD-10-CM

## 2024-03-11 DIAGNOSIS — E876 Hypokalemia: Secondary | ICD-10-CM | POA: Insufficient documentation

## 2024-03-11 MED ORDER — REPATHA SURECLICK 140 MG/ML ~~LOC~~ SOAJ: 140.0000 mg | SUBCUTANEOUS | 2 refills | Status: AC

## 2024-03-11 MED ORDER — UBRELVY 50 MG PO TABS: ORAL_TABLET | ORAL | 11 refills | Status: AC

## 2024-03-11 MED ORDER — PROPRANOLOL HCL ER 60 MG PO CP24: 60.0000 mg | ORAL_CAPSULE | Freq: Every day | ORAL | 3 refills | Status: AC

## 2024-03-11 MED ORDER — TIRZEPATIDE-WEIGHT MANAGEMENT 2.5 MG/0.5ML ~~LOC~~ SOAJ: 2.5000 mg | SUBCUTANEOUS | 11 refills | Status: AC

## 2024-03-11 MED ORDER — EVOLOCUMAB 140 MG/ML ~~LOC~~ SOAJ
140.0000 mg | Freq: Once | SUBCUTANEOUS | Status: AC
  Administered 2024-03-11: 140 mg via INTRAMUSCULAR

## 2024-03-11 MED ORDER — RYBELSUS 3 MG PO TABS: 3.0000 mg | ORAL_TABLET | Freq: Every day | ORAL | 2 refills | Status: AC

## 2024-03-11 NOTE — Assessment & Plan Note (Addendum)
 Reviewed available data from patient and  BP Readings from Last 3 Encounters:  03/11/24 138/80  02/18/24 (!) 142/82  12/11/23 126/80   Blood pressure is borderline high. Current medications include losartan  and amlodipine . Propranolol  is introduced for its additional benefits in migraine and anxiety management. A potential reduction of amlodipine  is discussed if blood pressure becomes too low. She will continue losartan  and amlodipine , with propranolol  prescribed for blood pressure management. Blood pressure will be monitored, and medications adjusted as needed.  My individualized, goal average blood pressure for this patient, after considering the evidence for and against aggressive blood pressure goals as well as their past medical history and preferences, is 140/90 In my medical opinion, this problem is stable, borderline controlled  Increased medication(s) prescribed after collaborative review. Following informed consent, we adjusted the medication regimen as per documented orders to now be:  Current hypertension medications:       Sig   propranolol  ER (INDERAL  LA) 60 MG 24 hr capsule (Taking) Take 1 capsule (60 mg total) by mouth daily.   amLODipine  (NORVASC ) 10 MG tablet Take 1 tablet (10 mg total) by mouth daily.   furosemide  (LASIX ) 20 MG tablet Take 1 tablet (20 mg total) by mouth daily as needed.   losartan  (COZAAR ) 25 MG tablet TAKE 1 TABLET (25 MG TOTAL) BY MOUTH DAILY.     We explained the expected benefits and potential side effects of the new medications and encouraged the patient to report any concerns. Information for patient review: Please limit and avoid: salt, alcohol, NSAIDS, excess body weight, smoking, stress, sedentary lifestyles The risks of poor control over time are FUTURE stroke and heart attacks, but if you have a blood pressure over 180 and any red flag symptoms(headache/shortness of breath/confusion/chest discomfort) you should go to the ER.  You are encouraged  to do home blood pressure monitoring, at least as many times per week as blood pressure medications you are on.  For example, bring a diary with 3 home blood pressure readings per week to each visit if you are on 3 blood pressure medications.   If you are on more than 3 medication(s) or have certain risk factors, we should do a resistant hypertension workup See AFTER VISIT SUMMARY for addition educational information provided Please let me know in advance when you need medication(s) refills, consistently taking your medication is very important!

## 2024-03-11 NOTE — Assessment & Plan Note (Signed)
 Diabetes is well-controlled with Actos . A potential switch to Zepbound  or Rybelsus  is discussed for better wt management and reduced side effects. Insurance approval is pending for these medications. She will continue Actos  until insurance approval for Zepbound  or Rybelsus  and monitor blood glucose levels. Lab Results  Component Value Date   HGBA1C 6.1 12/11/2023   HGBA1C 6.0 08/20/2023   HGBA1C 6.2 03/27/2023   HGBA1C 5.9 09/24/2022   HGBA1C 6.0 01/11/2022   HGBA1C 5.6 07/11/2021   HGBA1C 5.8 01/11/2021   HGBA1C 5.9 07/12/2020   HGBA1C 5.9 01/04/2020   HGBA1C 5.9 07/08/2019   HGBA1C 5.9 01/06/2019   HGBA1C 5.9 07/03/2018   HGBA1C 5.8 12/31/2017   HGBA1C 5.9 06/25/2017   HGBA1C 5.9 12/25/2016   HGBA1C 5.9 06/15/2016   HGBA1C 5.9 12/16/2015   HGBA1C 6.0 07/01/2015   HGBA1C 5.8 06/08/2014   HGBA1C 6.0 12/04/2013   HGBA1C 5.9 09/18/2013   HGBA1C 6.2 06/04/2013   HGBA1C 6.1 06/04/2012   HGBA1C 6.5 12/05/2011   HGBA1C 6.5 06/28/2011   HGBA1C 6.7 (H) 04/17/2011   HGBA1C 6.4 11/21/2010   HGBA1C 7.7 (H) 05/23/2010   HGBA1C 7.6 (H) 12/06/2009   HGBA1C 6.6 (H) 02/09/2009

## 2024-03-11 NOTE — Assessment & Plan Note (Signed)
 Managed with supplementation. She is advised to take vitamin D  with calcium  for optimal absorption. Vitamin D  supplementation with calcium  will continue.

## 2024-03-11 NOTE — Patient Instructions (Addendum)
 Maintain healthy potassium levels with these key steps:  Dietary Focus:  Target the recommended daily intake of 4,700mg  for adults. Prioritize fruits (bananas, oranges), vegetables (spinach, broccoli), and whole grains for rich potassium sources. Limit processed foods. Electrolyte Balance: Reduce sodium intake and stay hydrated to optimize potassium retention. Monitor and Manage:  Be aware of symptoms like muscle weakness and fatigue to help decide when you need checked. Supplement with Caution: Never self-medicate. Consult your doctor before taking potassium supplements.     Heres a concise table of common potassium-rich foods:  Food Approx. Potassium (mg per serving) Serving Size White beans   1,000 mg 1 cup (cooked) Lentils    730 mg 1 cup (cooked) Baked potato (with skin) 925 mg 1 medium Sweet potato   540 mg 1 medium Spinach   840 mg 1 cup (cooked) Swiss chard   960 mg 1 cup (cooked) Avocado   975 mg 1 whole Banana   420 mg 1 medium Orange juice   500 mg 1 cup Cantaloupe   430 mg 1 cup (cubed) Yogurt (plain, low-fat)  575 mg 1 cup Salmon   620 mg 3 oz (cooked) Chicken breast  440 mg 3 oz (cooked) Mushrooms (white)  425 mg 1 cup (cooked) Tomato sauce   725 mg 1 cup  Rationale: Potassium is abundant in beans, leafy greens, starchy vegetables, fruits, dairy, and lean meats. The listed foods balance plant and animal sources.  Alternatives: Other good sources include prunes, dried apricots, clams, cod, edamame, pumpkin, and beets.  Next actions:  Use this table to guide diet planning. If for medical purposes (e.g., kidney disease), potassium intake should be personalized -- check labs before major dietary shifts.   It was a pleasure seeing you today! Your health and satisfaction are our top priorities.  Bernardino Cone, MD  VISIT SUMMARY: Brandi Shaffer, a 79 year old female with hypertension and migraines, visited for a follow-up regarding her MRI results and headache  management. She experiences tension-related headaches, likely due to stress and insufficient food intake, and has been managing them through relaxation and music. She is on medications for hypertension, diabetes, and cholesterol management, and takes various supplements. She is proactive about her health and is learning to self-administer Repatha  injections for cholesterol management.  YOUR PLAN: -MIGRAINE WITH PERSISTENT AURA AND STATUS MIGRAINOSUS: Chronic migraines with persistent aura are likely exacerbated by stress and tension. A previous MRI was unremarkable. Nurtec was prescribed but not obtained due to insurance issues. Alternatives like triptans are contraindicated due to cardiovascular risks. Propranolol  is prescribed for migraine prevention and blood pressure management. An appeal for Nurtec and Ubrelvy  has been sent to insurance. You are advised to avoid triptans due to cardiovascular risks.  -ESSENTIAL HYPERTENSION: Your blood pressure is borderline high. Current medications include losartan  and amlodipine . Propranolol  is introduced for its additional benefits in migraine and anxiety management. A potential reduction of amlodipine  is discussed if blood pressure becomes too low. You will continue losartan  and amlodipine , with propranolol  prescribed for blood pressure management. Blood pressure will be monitored, and medications adjusted as needed.  -SITUATIONAL ANXIETY AND STRESS: Anxiety and stress are likely related to family concerns and media exposure. Propranolol  is introduced for its anxiolytic effects, reducing stress-induced adrenaline spikes. Propranolol  is prescribed for anxiety management.  -TYPE 2 DIABETES MELLITUS: Your diabetes is well-controlled with Actos . A potential switch to Zepbound  or Rybelsus  is discussed for better management and reduced side effects. Insurance approval is pending for these medications. You will  continue Actos  until insurance approval for Zepbound  or  Rybelsus  and monitor blood glucose levels.  -VITAMIN D  DEFICIENCY: Your vitamin D  deficiency is managed with supplementation. You are advised to take vitamin D  with calcium  for optimal absorption. Vitamin D  supplementation with calcium  will continue.  -HYPOKALEMIA: Your low potassium levels are likely due to Lasix  use. Potassium supplementation is currently used. Dietary sources of potassium and potential discontinuation of Lasix  and potassium supplements are discussed if fluid retention is manageable. High potassium diet recommendations are provided, and discontinuation of Lasix  and potassium supplements will be considered if fluid retention is manageable.  INSTRUCTIONS: Please monitor your blood pressure regularly and keep track of your blood glucose levels. Continue taking your current medications and supplements as prescribed. Follow the high potassium diet recommendations provided.  We will consider discontinuing Lasix  and potassium supplements if your fluid retention is manageable. An appeal for Nurtec and Ubrelvy  has been sent to your insurance, and we will update you on the approval status. If you have any concerns or experience any side effects, please contact our office.  Your Providers PCP: Jesus Bernardino MATSU, MD,  [phone]) Referring Provider: Jesus Bernardino MATSU, MD,  [phone]) Care Team Provider: Claudene Arthea HERO, DO,  [phone]) Care Team Provider: Gaynel Delon CROME, [place],  [phone]) Care Team Provider: Elner Arley LABOR, MD,  [phone]) Care Team Provider: Paul Barrio, [place],  [phone])  NEXT STEPS: [x]  Early Intervention: Schedule sooner appointment, call our on-call services, or go to emergency room if there is any significant Increase in pain or discomfort New or worsening symptoms Sudden or severe changes in your health [x]  Flexible Follow-Up: We recommend a Return in about 1 month (around 04/11/2024). for optimal routine care. This allows for  progress monitoring and treatment adjustments. [x]  Preventive Care: Schedule your annual preventive care visit! It's typically covered by insurance and helps identify potential health issues early. [x]  Lab & X-ray Appointments: Incomplete tests scheduled today, or call to schedule. X-rays: [facility] (M-F, 8:30am-noon or 1pm-5pm). [x]  Medical Information Release: Sign a release form at front desk to obtain relevant medical information we don't have.  MAKING THE MOST OF OUR FOCUSED 20 MINUTE APPOINTMENTS: [x]   Clearly state your top concerns at the beginning of the visit to focus our discussion [x]   If you anticipate you will need more time, please inform the front desk during scheduling - we can book multiple appointments in the same week. [x]   If you have transportation problems- use our convenient video appointments or ask about transportation support. [x]   We can get down to business faster if you use MyChart to update information before the visit and submit non-urgent questions before your visit. Thank you for taking the time to provide details through MyChart.  Let our nurse know and she can import this information into your encounter documents.  Arrival and Wait Times: [x]   Arriving on time ensures that everyone receives prompt attention. [x]   Early morning (8a) and afternoon (1p) appointments tend to have shortest wait times. [x]   Unfortunately, we cannot delay appointments for late arrivals or hold slots during phone calls.  Getting Answers and Following Up [x]   Simple Questions & Concerns: For quick questions or basic follow-up after your visit, reach us  at [phone] or MyChart messaging. [x]   Complex Concerns: If your concern is more complex, scheduling an appointment might be best. Discuss this with the staff to find the most suitable option. [x]   Lab & Imaging Results: We'll contact  you directly if results are abnormal or you don't use MyChart. Most normal  results will be on MyChart within 2-3 business days, with a review message from Dr. Jesus. Haven't heard back in 2 weeks? Need results sooner? Contact us  at [phone]. [x]   Referrals: Our referral coordinator will manage specialist referrals. The specialist's office should contact you within 2 weeks to schedule an appointment. Call us  if you haven't heard from them after 2 weeks.  Staying Connected [x]   MyChart: Activate your MyChart for the fastest way to access results and message us . See the last page of this paperwork for instructions on how to activate.  Bring to Your Next Appointment [x]   Medications: Please bring all your medication bottles to your next appointment to ensure we have an accurate record of your prescriptions. [x]   Health Diaries: If you're monitoring any health conditions at home, keeping a diary of your readings can be very helpful for discussions at your next appointment.  Billing [x]   X-ray & Lab Orders: These are billed by separate companies. Contact the invoicing company directly for questions or concerns. [x]   Visit Charges: Discuss any billing inquiries with our administrative services team.  Your Satisfaction Matters [x]   Share Your Experience: We strive for your satisfaction! If you have any complaints, or preferably compliments, please let Dr. Jesus know directly or contact our Practice Administrators, Manuelita Rubin or [employer], by asking at the front desk.   Reviewing Your Records [x]   Review this early draft of your clinical encounter notes below and the final encounter summary tomorrow on MyChart after its been completed.  All orders placed so far are visible here: Migraine aura, persistent, with status migrainosus Assessment & Plan: Request for Expedited Appeal: Medical Necessity & Safety Exception To: [facility] / [facility] [employer] Coverage Determinations Fax: [phone] Date: March 11, 2024  Requested Medication: Ubrelvy  (ubrogepant )  Diagnosis: Migraine with/without aura (G43.x), Coronary Artery Disease (I25.10) URGENT: EXPEDITED 24-HOUR REVIEW REQUESTED I certify that applying the standard 72-hour review timeframe may seriously jeopardize the life or health of the enrollee. Clinical Justification: This letter serves as a formal appeal for the coverage of Ubrelvy . This patient has a documented history of Coronary Artery Disease (CAD). The plan's requirement for a trial and failure of Triptans (e.g., Sumatriptan, Rizatriptan) is clinically inappropriate and life-threatening for this patient. Per FDA-approved labeling and the American Headache Society (AHS) guidelines, Triptans are strictly contraindicated in patients with ischemic heart disease or coronary artery vasospasm due to their vasoconstrictive mechanism of action. Contraindication: Triptans (Step 1 drugs) are contraindicated due to the patient's CAD. Safety: Prescribing a triptan to this patient would place them at immediate risk for myocardial infarction or cardiac arrhythmia. Medical Necessity: Ubrelvy  (a CGRP antagonist) does not cause vasoconstriction and is the standard of care for acute migraine treatment in patients with cardiovascular comorbidities. Requested Action: Please grant a Step Therapy Exception for Ubrelvy . The patient has suffered for months without adequate abortive therapy, and further delay in treatment constitutes a significant health risk.  Orders: -     Ubrelvy ; Primary Dose: Take 1 tablet (50 mg) by mouth at the onset of a migraine attack. Optional Second Dose: If migraine symptoms persist or recur, a second tablet may be taken at least 2 hours after the first dose. Maximum Dose: Do not exceed 200 mg (i.e., follow maximum daily limits as per current guidelines) within a 24-hour period. Additional Note: This medication is for acute treatment only and is not intended for  migraine prevention.  Dispense: 12 tablet; Refill: 11 -     Propranolol  HCl ER;  Take 1 capsule (60 mg total) by mouth daily.  Dispense: 90 capsule; Refill: 3  Stress -     Propranolol  HCl ER; Take 1 capsule (60 mg total) by mouth daily.  Dispense: 90 capsule; Refill: 3  Coronary artery disease due to lipid rich plaque -     Propranolol  HCl ER; Take 1 capsule (60 mg total) by mouth daily.  Dispense: 90 capsule; Refill: 3  Essential hypertension Assessment & Plan: Reviewed available data from patient and  BP Readings from Last 3 Encounters:  03/11/24 138/80  02/18/24 (!) 142/82  12/11/23 126/80   My individualized, goal average blood pressure for this patient, after considering the evidence for and against aggressive blood pressure goals as well as their past medical history and preferences, is 140/90 In my medical opinion, this problem is stable, borderline controlled  Increased medication(s) prescribed after collaborative review. Following informed consent, we adjusted the medication regimen as per documented orders to now be:  Current hypertension medications:       Sig   propranolol  ER (INDERAL  LA) 60 MG 24 hr capsule (Taking) Take 1 capsule (60 mg total) by mouth daily.   amLODipine  (NORVASC ) 10 MG tablet Take 1 tablet (10 mg total) by mouth daily.   furosemide  (LASIX ) 20 MG tablet Take 1 tablet (20 mg total) by mouth daily as needed.   losartan  (COZAAR ) 25 MG tablet TAKE 1 TABLET (25 MG TOTAL) BY MOUTH DAILY.     We explained the expected benefits and potential side effects of the new medications and encouraged the patient to report any concerns. Information for patient review: Please limit and avoid: salt, alcohol, NSAIDS, excess body weight, smoking, stress, sedentary lifestyles The risks of poor control over time are FUTURE stroke and heart attacks, but if you have a blood pressure over 180 and any red flag symptoms(headache/shortness of breath/confusion/chest discomfort) you should go to the ER.  You are encouraged to do home blood pressure monitoring, at  least as many times per week as blood pressure medications you are on.  For example, bring a diary with 3 home blood pressure readings per week to each visit if you are on 3 blood pressure medications.   If you are on more than 3 medication(s) or have certain risk factors, we should do a resistant hypertension workup See AFTER VISIT SUMMARY for addition educational information provided Please let me know in advance when you need medication(s) refills, consistently taking your medication is very important!   Orders: -     Propranolol  HCl ER; Take 1 capsule (60 mg total) by mouth daily.  Dispense: 90 capsule; Refill: 3  Situational anxiety -     Propranolol  HCl ER; Take 1 capsule (60 mg total) by mouth daily.  Dispense: 90 capsule; Refill: 3  Vitamin D  deficiency  Hypokalemia -     Evolocumab   Diabetes mellitus without complication (HCC) -     Tirzepatide -Weight Management; Inject 2.5 mg into the skin once a week.  Dispense: 2 mL; Refill: 11 -     Rybelsus ; Take 1 tablet (3 mg total) by mouth daily.  Dispense: 30 tablet; Refill: 2  Morbid obesity (HCC)  Other orders -     Repatha  SureClick; Inject 140 mg into the skin every 14 (fourteen) days.  Dispense: 2 mL; Refill: 2

## 2024-03-11 NOTE — Assessment & Plan Note (Signed)
 Likely secondary to Lasix  use. Potassium supplementation is currently used. Dietary sources of potassium and potential discontinuation of Lasix  and potassium supplements are discussed if fluid retention is manageable. High potassium diet recommendations are provided, and discontinuation of Lasix  and potassium supplements will be considered if fluid retention is manageable.

## 2024-03-11 NOTE — Progress Notes (Signed)
 ==============================  [facility]: [phone]   -- Medical Office Visit --  Patient: Brandi Shaffer      Age: 79 y.o.       Sex:  female  Date:   03/11/2024 Today's Healthcare Provider: Bernardino KANDICE Cone, MD  ==============================   Chief Complaint: Discuss Ct scan  Discussed the use of AI scribe software for clinical note transcription with the patient, who gave verbal consent to proceed.  History of Present Illness 79 year old female with hypertension and migraines who presents for follow-up regarding her MRI results and headache management.  She experiences tension-related headaches, possibly due to stress and insufficient food intake. Stress from family concerns, particularly regarding her grandson, affects her sleep, as she often wakes up thinking about his situation. She has tried over-the-counter stress relief supplements but does not take prescription medications for stress. She previously had an MRI of the brain due to headaches. She was prescribed Nurtec for potential migraines, but her insurance did not cover it, and she has not received the medication. She continues to experience headaches and is learning to manage them through relaxation and music.  She is on losartan  and amlodipine  for hypertension and has been on these medications for many years. She also takes a potassium supplement and a vitamin D  supplement due to a history of low potassium and vitamin D  levels. She is cautious about her diet, avoiding high-salt foods and cooking at home to manage her health.  She has started Repatha  injections for cholesterol management and is learning how to self-administer the injections. She expresses apprehension about needles but is willing to learn the technique. She has been on Actos  for diabetes management for many years and reports good control of her blood sugar levels, with a recent reading of 94 mg/dL. She is mindful of her  diet to maintain her blood sugar levels.  She takes a multivitamin for women, vitamin D , magnesium, and calcium  supplements. She is also on an inhaler for respiratory issues. She is proactive about her health, focusing on a balanced diet and minimizing medication use where possible. No significant memory issues, and she is often relied upon by family members for remembering appointments and tasks.  Background Reviewed: Problem List: has LIPOMA; Hypothyroidism; Essential hypertension; DIVERTICULOSIS, COLON; Genital herpes; Hyperlipidemia; Nocturia; Right foot pain; Carpal tunnel syndrome of right wrist; Low back pain; GERD (gastroesophageal reflux disease); Renal cyst; Mild aortic stenosis; Asthma; Impingement syndrome of left shoulder; Hx of colonic polyps; Obesity (BMI 30-39.9); Right leg pain; PVD (peripheral vascular disease); Bilateral leg pain; Osteoarthritis of left ankle; Situational anxiety; Right otitis media; Superficial phlebitis; Pelvic pain; Vitamin D  deficiency; B12 deficiency; Posterior vitreous detachment of right eye; Posterior vitreous detachment of left eye; Diabetes mellitus without complication (HCC); Nuclear sclerotic cataract of both eyes; Leg length discrepancy; Peripheral edema; Multiple skin nodules; Chronic kidney disease, stage 2 (mild); Fatty liver; Heart murmur; Chronic right shoulder pain; Supraclavicular fossa fullness; CAD (coronary artery disease); Leg cramps; Other headache syndrome; Statin intolerance; Neck stiffness; Chronic migraine without aura with status migrainosus, not intractable; White matter disease; Morbid obesity (HCC); and Hypokalemia on their problem list. Past Medical History:  has a past medical history of ANEMIA-IRON DEFICIENCY (12/06/2009), Anxiety state (07/01/2015), Asthma (12/05/2011), Cellulitis (04/01/2015), CONJUNCTIVITIS, BILATERAL (12/06/2009), Dental infection (04/05/2022), Diabetes (HCC) (09/17/2006), DIABETES MELLITUS, TYPE II (09/17/2006),  DIVERTICULOSIS, COLON (12/06/2009), GENITAL HERPES (03/24/2010), GERD (gastroesophageal reflux disease) (06/28/2011), Greater trochanteric bursitis of left hip (12/19/2015), Greater trochanteric bursitis,  right (08/09/2020), HYPERLIPIDEMIA (03/24/2010), HYPERTENSION (09/17/2006), HYPOTHYROIDISM (09/17/2006), LIPOMA (12/06/2009), Pain of right thumb (01/06/2019), PERIPHERAL EDEMA (03/24/2010), Renal cyst (07/05/2011), Shoulder dislocation (08/03/2015), Sprain of second toe, left, initial encounter (12/18/2017), and Urinary frequency (01/11/2020). Past Surgical History:   has a past surgical history that includes Tubal ligation; Abdominal hysterectomy; ankle surgury; Tonsillectomy; Shoulder surgery (Left); and Colonoscopy (2011). Social History:   reports that she has never smoked. She has never been exposed to tobacco smoke. She has never used smokeless tobacco. She reports that she does not drink alcohol and does not use drugs. Family History:  family history includes Breast cancer (age of onset: 3) in her sister; Diabetes in an other family member; Heart disease in her sister; Hypertension in her father and mother; Joint hypermobility in an other family member; Prostate cancer in her brother; Stroke in her sister. Allergies:  is allergic to januvia [sitagliptin phosphate], lipitor [atorvastatin ], metformin  and related, soma [carisoprodol], and doxycycline .   Medication Reconciliation: Current Outpatient Medications on File Prior to Visit  Medication Sig   albuterol  (VENTOLIN  HFA) 108 (90 Base) MCG/ACT inhaler TAKE 2 PUFFS BY MOUTH EVERY 6 HOURS AS NEEDED FOR WHEEZE   amLODipine  (NORVASC ) 10 MG tablet Take 1 tablet (10 mg total) by mouth daily.   ammonium lactate  (LAC-HYDRIN ) 12 % lotion APPLY 1 APPLICATION TOPICALLY AS NEEDED FOR DRY SKIN.   APPLE CIDER VINEGAR PO Take by mouth. (Patient not taking: Reported on 02/18/2024)   aspirin  81 MG EC tablet TAKE 1 TABLET BY MOUTH DAILY. SWALLOW WHOLE.   Blood  Glucose Monitoring Suppl (ACCU-CHEK GUIDE ME) w/Device KIT 1 each by Does not apply route daily.   calcium  carbonate (OSCAL) 1500 (600 Ca) MG TABS tablet Take by mouth 2 (two) times daily with a meal. (Patient taking differently: Take by mouth 2 (two) times daily with a meal. Has magnesium and zinc with d3)   chlorhexidine  (PERIDEX ) 0.12 % solution USE AS DIRECTED   Cholecalciferol  50 MCG (2000 UT) TABS 1 tab by mouth once daily   clotrimazole  (LOTRIMIN ) 1 % cream Apply to affected feet and between toes twice daily for 6 weeks for athlete's feet   cyclobenzaprine  (FLEXERIL ) 5 MG tablet Take 5 mg by mouth 3 (three) times daily as needed for muscle spasms.   Evolocumab  (REPATHA  SURECLICK) 140 MG/ML SOAJ Inject 140 mg into the skin every 14 (fourteen) days.   fluconazole  (DIFLUCAN ) 150 MG tablet Take 1 tablet (150 mg total) by mouth once a week.   furosemide  (LASIX ) 20 MG tablet Take 1 tablet (20 mg total) by mouth daily as needed.   gabapentin  (NEURONTIN ) 300 MG capsule TAKE 1 CAPSULE BY MOUTH THREE TIMES A DAY (Patient taking differently: Taking as needed)   losartan  (COZAAR ) 25 MG tablet TAKE 1 TABLET (25 MG TOTAL) BY MOUTH DAILY.   meclizine  (ANTIVERT ) 12.5 MG tablet TAKE 1 TABLET BY MOUTH 3 TIMES DAILY AS NEEDED.   Misc Natural Products (CRANBERRY/PROBIOTIC PO) Take by mouth.   Multiple Vitamins-Minerals (WOMENS MULTIVITAMIN PO) Take 1 tablet by mouth daily.   NURTEC 75 MG TBDP TAKE 1 TABLET (75 MG TOTAL) BY MOUTH DAILY AT 6 (SIX) AM.   OneTouch Delica Lancets 33G MISC 1 each by Does not apply route 2 (two) times daily. Use to check blood sugars twice a day Dx E11.9   ONETOUCH VERIO test strip USE 2 (TWO) TIMES DAILY. DX E11.9   pentoxifylline  (TRENTAL ) 400 MG CR tablet TAKE 1 TABLET BY MOUTH 3 TIMES DAILY WITH MEALS. (  Patient taking differently: Taking as needed)   pioglitazone  (ACTOS ) 15 MG tablet TAKE 1 TABLET BY MOUTH EVERY DAY   tacrolimus  (PROTOPIC ) 0.1 % ointment APPLY TOPICALLY TWICE A  DAY   tiZANidine  (ZANAFLEX ) 2 MG tablet TAKE 1 CAPSULE BY MOUTH 3 TIMES DAILY.   triamcinolone  cream (KENALOG ) 0.1 % APPLY 1 APPLICATION TOPICALLY DAILY   vitamin B-12 (CYANOCOBALAMIN ) 1000 MCG tablet 1 tab by mouth mon - wed - thur   No current facility-administered medications on file prior to visit.   Medications Discontinued During This Encounter  Medication Reason   predniSONE  (DELTASONE ) 20 MG tablet    traMADol  (ULTRAM ) 50 MG tablet    potassium chloride  (KLOR-CON ) 10 MEQ tablet    ezetimibe  (ZETIA ) 10 MG tablet      Physical Exam:    03/11/2024   12:46 PM 02/18/2024   10:21 AM 12/11/2023   10:08 AM  Vitals with BMI  Height 4' 11 4' 11 4' 11  Weight 182 lbs 13 oz 177 lbs 13 oz 186 lbs  BMI 36.9 35.89 37.55  Systolic 138 142 873  Diastolic 80 82 80  Pulse 82 88 80  Vital signs reviewed.  Nursing notes reviewed. Weight trend reviewed. Physical Activity: Inactive (03/19/2023)   Exercise Vital Sign    Days of Exercise per Week: 0 days    Minutes of Exercise per Session: 0 min   General Appearance:  No acute distress appreciable.   Well-groomed, healthy-appearing female.  Well proportioned with no abnormal fat distribution.  Good muscle tone. Pulmonary:  Normal work of breathing at rest, no respiratory distress apparent. SpO2: 98 %  Musculoskeletal: All extremities are intact.  Neurological:  Awake, alert, oriented, and engaged.  No obvious focal neurological deficits or cognitive impairments.  Sensorium seems unclouded.   Speech is clear and coherent with logical content. Psychiatric:  Appropriate mood, pleasant and cooperative demeanor, thoughtful and engaged during the exam  Results Radiology Brain MRI: No intracranial abnormality; no evidence of neoplasm     08/20/2023    1:19 PM 03/19/2023   11:10 AM 12/10/2022   11:37 AM 11/09/2022   11:50 AM  PHQ 2/9 Scores  PHQ - 2 Score 0 0 0 0  PHQ- 9 Score  0  0       Data saved with a previous flowsheet row definition     Office Visit on 02/18/2024  Component Date Value Ref Range Status   Sed Rate 02/18/2024 12  0 - 30 mm/hr Final   CRP, High Sensitivity 02/18/2024 7.630 (H)  0.200 - 5.000 mg/L Final   Cholesterol 02/18/2024 180  28 - 200 mg/dL Final   Triglycerides 87/76/7974 87.0  10.0 - 149.0 mg/dL Final   HDL 87/76/7974 52.30  >39.00 mg/dL Final   VLDL 87/76/7974 17.4  0.0 - 40.0 mg/dL Final   LDL Cholesterol 02/18/2024 110 (H)  10 - 99 mg/dL Final   Total CHOL/HDL Ratio 02/18/2024 3   Final   NonHDL 02/18/2024 127.66   Final   Microalb, Ur 02/18/2024 1.4  0.7 - 1.9 mg/dL Final   Creatinine,U 87/76/7974 77.1  mg/dL Final   Microalb Creat Ratio 02/18/2024 18.3  0.0 - 30.0 mg/g Final   Color, Urine 02/18/2024 YELLOW  Yellow;Lt. Yellow;Straw;Dark Yellow;Amber;Green;Red;Brown Final   APPearance 02/18/2024 CLEAR  Clear;Turbid;Slightly Cloudy;Cloudy Final   Specific Gravity, Urine 02/18/2024 1.015  1.000 - 1.030 Final   pH 02/18/2024 7.0  5.0 - 8.0 Final   Total Protein, Urine 02/18/2024 NEGATIVE  Negative Final   Urine Glucose 02/18/2024 NEGATIVE  Negative Final   Ketones, ur 02/18/2024 NEGATIVE  Negative Final   Bilirubin Urine 02/18/2024 NEGATIVE  Negative Final   Hgb urine dipstick 02/18/2024 NEGATIVE  Negative Final   Urobilinogen, UA 02/18/2024 0.2  0.0 - 1.0 Final   Leukocytes,Ua 02/18/2024 NEGATIVE  Negative Final   Nitrite 02/18/2024 NEGATIVE  Negative Final   WBC, UA 02/18/2024 0-2/hpf  0-2/hpf Final   RBC / HPF 02/18/2024 0-2/hpf  0-2/hpf Final   Squamous Epithelial / HPF 02/18/2024 Rare(0-4/hpf)  Rare(0-4/hpf) Final  Scanned Document on 01/03/2024  Component Date Value Ref Range Status   HM Diabetic Eye Exam 01/01/2024 No Retinopathy  No Retinopathy Final  Office Visit on 12/11/2023  Component Date Value Ref Range Status   Microalb, Ur 12/11/2023 1.7  0.0 - 1.9 mg/dL Final   Creatinine,U 89/84/7974 102.9  mg/dL Final   Microalb Creat Ratio 12/11/2023 16.4  0.0 - 30.0 mg/g Final    Hgb A1c MFr Bld 12/11/2023 6.1  4.6 - 6.5 % Final   Cholesterol 12/11/2023 204 (H)  0 - 200 mg/dL Final   Triglycerides 89/84/7974 93.0  0.0 - 149.0 mg/dL Final   HDL 89/84/7974 57.20  >39.00 mg/dL Final   VLDL 89/84/7974 18.6  0.0 - 40.0 mg/dL Final   LDL Cholesterol 12/11/2023 128 (H)  0 - 99 mg/dL Final   Total CHOL/HDL Ratio 12/11/2023 4   Final   NonHDL 12/11/2023 147.03   Final   Total Bilirubin 12/11/2023 0.6  0.2 - 1.2 mg/dL Final   Bilirubin, Direct 12/11/2023 0.1  0.0 - 0.3 mg/dL Final   Alkaline Phosphatase 12/11/2023 82  39 - 117 U/L Final   AST 12/11/2023 19  0 - 37 U/L Final   ALT 12/11/2023 13  0 - 35 U/L Final   Total Protein 12/11/2023 6.9  6.0 - 8.3 g/dL Final   Albumin 89/84/7974 4.3  3.5 - 5.2 g/dL Final   WBC 89/84/7974 5.4  4.0 - 10.5 K/uL Final   RBC 12/11/2023 4.29  3.87 - 5.11 Mil/uL Final   Hemoglobin 12/11/2023 13.2  12.0 - 15.0 g/dL Final   HCT 89/84/7974 40.1  36.0 - 46.0 % Final   MCV 12/11/2023 93.4  78.0 - 100.0 fl Final   MCHC 12/11/2023 32.9  30.0 - 36.0 g/dL Final   RDW 89/84/7974 14.7  11.5 - 15.5 % Final   Platelets 12/11/2023 257.0  150.0 - 400.0 K/uL Final   Neutrophils Relative % 12/11/2023 45.8  43.0 - 77.0 % Final   Lymphocytes Relative 12/11/2023 40.0  12.0 - 46.0 % Final   Monocytes Relative 12/11/2023 10.7  3.0 - 12.0 % Final   Eosinophils Relative 12/11/2023 2.7  0.0 - 5.0 % Final   Basophils Relative 12/11/2023 0.8  0.0 - 3.0 % Final   Neutro Abs 12/11/2023 2.5  1.4 - 7.7 K/uL Final   Lymphs Abs 12/11/2023 2.1  0.7 - 4.0 K/uL Final   Monocytes Absolute 12/11/2023 0.6  0.1 - 1.0 K/uL Final   Eosinophils Absolute 12/11/2023 0.1  0.0 - 0.7 K/uL Final   Basophils Absolute 12/11/2023 0.0  0.0 - 0.1 K/uL Final   TSH 12/11/2023 1.38  0.35 - 5.50 uIU/mL Final   Color, Urine 12/11/2023 YELLOW  Yellow;Lt. Yellow;Straw;Dark Yellow;Amber;Green;Red;Brown Final   APPearance 12/11/2023 CLEAR  Clear;Turbid;Slightly Cloudy;Cloudy Final   Specific  Gravity, Urine 12/11/2023 1.020  1.000 - 1.030 Final   pH 12/11/2023 6.0  5.0 - 8.0 Final  Total Protein, Urine 12/11/2023 NEGATIVE  Negative Final   Urine Glucose 12/11/2023 NEGATIVE  Negative Final   Ketones, ur 12/11/2023 NEGATIVE  Negative Final   Bilirubin Urine 12/11/2023 NEGATIVE  Negative Final   Hgb urine dipstick 12/11/2023 NEGATIVE  Negative Final   Urobilinogen, UA 12/11/2023 0.2  0.0 - 1.0 Final   Leukocytes,Ua 12/11/2023 TRACE (A)  Negative Final   Nitrite 12/11/2023 NEGATIVE  Negative Final   WBC, UA 12/11/2023 3-6/hpf (A)  0-2/hpf Final   RBC / HPF 12/11/2023 0-2/hpf  0-2/hpf Final   Squamous Epithelial / HPF 12/11/2023 Few(5-10/hpf) (A)  Rare(0-4/hpf) Final   Sodium 12/11/2023 143  135 - 145 mEq/L Final   Potassium 12/11/2023 3.9  3.5 - 5.1 mEq/L Final   Chloride 12/11/2023 107  96 - 112 mEq/L Final   CO2 12/11/2023 28  19 - 32 mEq/L Final   Glucose, Bld 12/11/2023 92  70 - 99 mg/dL Final   BUN 89/84/7974 17  6 - 23 mg/dL Final   Creatinine, Ser 12/11/2023 0.93  0.40 - 1.20 mg/dL Final   GFR 89/84/7974 58.68 (L)  >60.00 mL/min Final   Calcium  12/11/2023 9.2  8.4 - 10.5 mg/dL Final   VITD 89/84/7974 17.95 (L)  30.00 - 100.00 ng/mL Final   Vitamin B-12 12/11/2023 1,180 (H)  211 - 911 pg/mL Final  Office Visit on 08/20/2023  Component Date Value Ref Range Status   Vitamin B-12 08/20/2023 1,099 (H)  211 - 911 pg/mL Final   VITD 08/20/2023 25.08 (L)  30.00 - 100.00 ng/mL Final   Sodium 08/20/2023 142  135 - 145 mEq/L Final   Potassium 08/20/2023 3.8  3.5 - 5.1 mEq/L Final   Chloride 08/20/2023 106  96 - 112 mEq/L Final   CO2 08/20/2023 30  19 - 32 mEq/L Final   Glucose, Bld 08/20/2023 87  70 - 99 mg/dL Final   BUN 93/75/7974 16  6 - 23 mg/dL Final   Creatinine, Ser 08/20/2023 0.95  0.40 - 1.20 mg/dL Final   GFR 93/75/7974 57.32 (L)  >60.00 mL/min Final   Calcium  08/20/2023 9.5  8.4 - 10.5 mg/dL Final   Color, Urine 93/75/7974 YELLOW  Yellow;Lt. Yellow;Straw;Dark  Yellow;Amber;Green;Red;Brown Final   APPearance 08/20/2023 CLEAR  Clear;Turbid;Slightly Cloudy;Cloudy Final   Specific Gravity, Urine 08/20/2023 1.010  1.000 - 1.030 Final   pH 08/20/2023 7.0  5.0 - 8.0 Final   Total Protein, Urine 08/20/2023 NEGATIVE  Negative Final   Urine Glucose 08/20/2023 NEGATIVE  Negative Final   Ketones, ur 08/20/2023 NEGATIVE  Negative Final   Bilirubin Urine 08/20/2023 NEGATIVE  Negative Final   Hgb urine dipstick 08/20/2023 NEGATIVE  Negative Final   Urobilinogen, UA 08/20/2023 0.2  0.0 - 1.0 Final   Leukocytes,Ua 08/20/2023 NEGATIVE  Negative Final   Nitrite 08/20/2023 NEGATIVE  Negative Final   WBC, UA 08/20/2023 none seen  0-2/hpf Final   RBC / HPF 08/20/2023 0-2/hpf  0-2/hpf Final   Squamous Epithelial / HPF 08/20/2023 Rare(0-4/hpf)  Rare(0-4/hpf) Final   TSH 08/20/2023 2.12  0.35 - 5.50 uIU/mL Final   WBC 08/20/2023 6.5  4.0 - 10.5 K/uL Final   RBC 08/20/2023 4.39  3.87 - 5.11 Mil/uL Final   Hemoglobin 08/20/2023 13.5  12.0 - 15.0 g/dL Final   HCT 93/75/7974 40.5  36.0 - 46.0 % Final   MCV 08/20/2023 92.2  78.0 - 100.0 fl Final   MCHC 08/20/2023 33.3  30.0 - 36.0 g/dL Final   RDW 93/75/7974 14.5  11.5 -  15.5 % Final   Platelets 08/20/2023 257.0  150.0 - 400.0 K/uL Final   Neutrophils Relative % 08/20/2023 42.2 (L)  43.0 - 77.0 % Final   Lymphocytes Relative 08/20/2023 46.2 (H)  12.0 - 46.0 % Final   Monocytes Relative 08/20/2023 8.6  3.0 - 12.0 % Final   Eosinophils Relative 08/20/2023 2.5  0.0 - 5.0 % Final   Basophils Relative 08/20/2023 0.5  0.0 - 3.0 % Final   Neutro Abs 08/20/2023 2.7  1.4 - 7.7 K/uL Final   Lymphs Abs 08/20/2023 3.0  0.7 - 4.0 K/uL Final   Monocytes Absolute 08/20/2023 0.6  0.1 - 1.0 K/uL Final   Eosinophils Absolute 08/20/2023 0.2  0.0 - 0.7 K/uL Final   Basophils Absolute 08/20/2023 0.0  0.0 - 0.1 K/uL Final   Total Bilirubin 08/20/2023 0.5  0.2 - 1.2 mg/dL Final   Bilirubin, Direct 08/20/2023 0.1  0.0 - 0.3 mg/dL Final    Alkaline Phosphatase 08/20/2023 107  39 - 117 U/L Final   AST 08/20/2023 24  0 - 37 U/L Final   ALT 08/20/2023 14  0 - 35 U/L Final   Total Protein 08/20/2023 7.4  6.0 - 8.3 g/dL Final   Albumin 93/75/7974 4.5  3.5 - 5.2 g/dL Final   Cholesterol 93/75/7974 190  0 - 200 mg/dL Final   Triglycerides 93/75/7974 89.0  0.0 - 149.0 mg/dL Final   HDL 93/75/7974 64.20  >39.00 mg/dL Final   VLDL 93/75/7974 17.8  0.0 - 40.0 mg/dL Final   LDL Cholesterol 08/20/2023 108 (H)  0 - 99 mg/dL Final   Total CHOL/HDL Ratio 08/20/2023 3   Final   NonHDL 08/20/2023 125.50   Final   Hgb A1c MFr Bld 08/20/2023 6.0  4.6 - 6.5 % Final   Microalb, Ur 08/20/2023 1.0  0.0 - 1.9 mg/dL Final   Creatinine,U 93/75/7974 55.0  mg/dL Final   Microalb Creat Ratio 08/20/2023 17.7  0.0 - 30.0 mg/g Final  Scanned Document on 07/11/2023  Component Date Value Ref Range Status   HM Diabetic Eye Exam 07/04/2023 No Retinopathy  No Retinopathy Final  Office Visit on 03/27/2023  Component Date Value Ref Range Status   Hgb A1c MFr Bld 03/27/2023 6.2  4.6 - 6.5 % Final   Cholesterol 03/27/2023 196  0 - 200 mg/dL Final   Triglycerides 98/70/7974 76.0  0.0 - 149.0 mg/dL Final   HDL 98/70/7974 60.00  >39.00 mg/dL Final   VLDL 98/70/7974 15.2  0.0 - 40.0 mg/dL Final   LDL Cholesterol 03/27/2023 120 (H)  0 - 99 mg/dL Final   Total CHOL/HDL Ratio 03/27/2023 3   Final   NonHDL 03/27/2023 135.51   Final   Total Bilirubin 03/27/2023 0.5  0.2 - 1.2 mg/dL Final   Bilirubin, Direct 03/27/2023 0.1  0.0 - 0.3 mg/dL Final   Alkaline Phosphatase 03/27/2023 113  39 - 117 U/L Final   AST 03/27/2023 20  0 - 37 U/L Final   ALT 03/27/2023 12  0 - 35 U/L Final   Total Protein 03/27/2023 7.1  6.0 - 8.3 g/dL Final   Albumin 98/70/7974 4.3  3.5 - 5.2 g/dL Final   WBC 98/70/7974 5.4  4.0 - 10.5 K/uL Final   RBC 03/27/2023 4.49  3.87 - 5.11 Mil/uL Final   Hemoglobin 03/27/2023 13.9  12.0 - 15.0 g/dL Final   HCT 98/70/7974 42.2  36.0 - 46.0 % Final    MCV 03/27/2023 94.0  78.0 - 100.0 fl Final  MCHC 03/27/2023 33.0  30.0 - 36.0 g/dL Final   RDW 98/70/7974 14.2  11.5 - 15.5 % Final   Platelets 03/27/2023 265.0  150.0 - 400.0 K/uL Final   Neutrophils Relative % 03/27/2023 42.8 (L)  43.0 - 77.0 % Final   Lymphocytes Relative 03/27/2023 44.9  12.0 - 46.0 % Final   Monocytes Relative 03/27/2023 8.4  3.0 - 12.0 % Final   Eosinophils Relative 03/27/2023 3.2  0.0 - 5.0 % Final   Basophils Relative 03/27/2023 0.7  0.0 - 3.0 % Final   Neutro Abs 03/27/2023 2.3  1.4 - 7.7 K/uL Final   Lymphs Abs 03/27/2023 2.4  0.7 - 4.0 K/uL Final   Monocytes Absolute 03/27/2023 0.5  0.1 - 1.0 K/uL Final   Eosinophils Absolute 03/27/2023 0.2  0.0 - 0.7 K/uL Final   Basophils Absolute 03/27/2023 0.0  0.0 - 0.1 K/uL Final   TSH 03/27/2023 1.72  0.35 - 5.50 uIU/mL Final   Color, Urine 03/27/2023 YELLOW  Yellow;Lt. Yellow;Straw;Dark Yellow;Amber;Green;Red;Brown Final   APPearance 03/27/2023 CLEAR  Clear;Turbid;Slightly Cloudy;Cloudy Final   Specific Gravity, Urine 03/27/2023 1.015  1.000 - 1.030 Final   pH 03/27/2023 7.5  5.0 - 8.0 Final   Total Protein, Urine 03/27/2023 NEGATIVE  Negative Final   Urine Glucose 03/27/2023 NEGATIVE  Negative Final   Ketones, ur 03/27/2023 NEGATIVE  Negative Final   Bilirubin Urine 03/27/2023 NEGATIVE  Negative Final   Hgb urine dipstick 03/27/2023 NEGATIVE  Negative Final   Urobilinogen, UA 03/27/2023 0.2  0.0 - 1.0 Final   Leukocytes,Ua 03/27/2023 SMALL (A)  Negative Final   Nitrite 03/27/2023 NEGATIVE  Negative Final   WBC, UA 03/27/2023 3-6/hpf (A)  0-2/hpf Final   RBC / HPF 03/27/2023 0-2/hpf  0-2/hpf Final   Squamous Epithelial / HPF 03/27/2023 Few(5-10/hpf) (A)  Rare(0-4/hpf) Final   Bacteria, UA 03/27/2023 Few(10-50/hpf) (A)  None Final   Sodium 03/27/2023 142  135 - 145 mEq/L Final   Potassium 03/27/2023 3.6  3.5 - 5.1 mEq/L Final   Chloride 03/27/2023 106  96 - 112 mEq/L Final   CO2 03/27/2023 30  19 - 32 mEq/L Final    Glucose, Bld 03/27/2023 83  70 - 99 mg/dL Final   BUN 98/70/7974 18  6 - 23 mg/dL Final   Creatinine, Ser 03/27/2023 0.87  0.40 - 1.20 mg/dL Final   GFR 98/70/7974 63.88  >60.00 mL/min Final   Calcium  03/27/2023 9.1  8.4 - 10.5 mg/dL Final   Vitamin A-87 98/70/7974 882  211 - 911 pg/mL Final   VITD 03/27/2023 20.96 (L)  30.00 - 100.00 ng/mL Final  Scanned Document on 12/13/2022  Component Date Value Ref Range Status   HM Diabetic Eye Exam 12/13/2022 No Retinopathy  No Retinopathy Final  Admission on 12/11/2022, Discharged on 12/12/2022  Component Date Value Ref Range Status   WBC 12/11/2022 6.6  4.0 - 10.5 K/uL Final   RBC 12/11/2022 4.27  3.87 - 5.11 MIL/uL Final   Hemoglobin 12/11/2022 13.0  12.0 - 15.0 g/dL Final   HCT 89/84/7975 39.5  36.0 - 46.0 % Final   MCV 12/11/2022 92.5  80.0 - 100.0 fL Final   MCH 12/11/2022 30.4  26.0 - 34.0 pg Final   MCHC 12/11/2022 32.9  30.0 - 36.0 g/dL Final   RDW 89/84/7975 14.4  11.5 - 15.5 % Final   Platelets 12/11/2022 287  150 - 400 K/uL Final   nRBC 12/11/2022 0.0  0.0 - 0.2 % Final   Neutrophils Relative %  12/11/2022 43  % Final   Neutro Abs 12/11/2022 2.8  1.7 - 7.7 K/uL Final   Lymphocytes Relative 12/11/2022 44  % Final   Lymphs Abs 12/11/2022 3.0  0.7 - 4.0 K/uL Final   Monocytes Relative 12/11/2022 9  % Final   Monocytes Absolute 12/11/2022 0.6  0.1 - 1.0 K/uL Final   Eosinophils Relative 12/11/2022 3  % Final   Eosinophils Absolute 12/11/2022 0.2  0.0 - 0.5 K/uL Final   Basophils Relative 12/11/2022 1  % Final   Basophils Absolute 12/11/2022 0.0  0.0 - 0.1 K/uL Final   Immature Granulocytes 12/11/2022 0  % Final   Abs Immature Granulocytes 12/11/2022 0.02  0.00 - 0.07 K/uL Final   Sodium 12/11/2022 140  135 - 145 mmol/L Final   Potassium 12/11/2022 4.0  3.5 - 5.1 mmol/L Final   Chloride 12/11/2022 106  98 - 111 mmol/L Final   CO2 12/11/2022 26  22 - 32 mmol/L Final   Glucose, Bld 12/11/2022 90  70 - 99 mg/dL Final   BUN  89/84/7975 16  8 - 23 mg/dL Final   Creatinine, Ser 12/11/2022 1.03 (H)  0.44 - 1.00 mg/dL Final   Calcium  12/11/2022 9.3  8.9 - 10.3 mg/dL Final   GFR, Estimated 12/11/2022 56 (L)  >60 mL/min Final   Anion gap 12/11/2022 8  5 - 15 Final   Specimen Description 12/11/2022    Final                   Value:BLOOD LEFT ANTECUBITAL Performed at [facility], [address], [place], [place] [address]    Special Requests 12/11/2022    Final                   Value:BOTTLES DRAWN AEROBIC AND ANAEROBIC Blood Culture adequate volume Performed at [facility], [address], [place], [place] [address]    Culture 12/11/2022    Final                   Value:NO GROWTH 5 DAYS Performed at [facility], [address], [place], [place] [address]    Report Status 12/11/2022 12/16/2022 FINAL   Final   Specimen Description 12/11/2022    Final                   Value:BLOOD RIGHT ANTECUBITAL Performed at [facility], [address] [place], [place] [address]    Special Requests 12/11/2022    Final                   Value:BOTTLES DRAWN AEROBIC AND ANAEROBIC Blood Culture adequate volume Performed at [facility], [address], [place], [place] [address]    Culture 12/11/2022    Final                   Value:NO GROWTH 5 DAYS Performed at [facility], [address], [place], [place] [address]    Report Status 12/11/2022 12/16/2022 FINAL   Final   CRP 12/11/2022 0.7  <1.0 mg/dL Final   Sed Rate 89/84/7975 8  0 - 22 mm/hr Final   Glucose-Capillary 12/11/2022 85  70 - 99 mg/dL Final   Specimen Source 12/11/2022 URINE, UNSPE   Final   Color, Urine 12/11/2022 COLORLESS (A)  YELLOW Final   APPearance 12/11/2022 CLEAR  CLEAR Final   Specific Gravity, Urine 12/11/2022 1.039 (H)  1.005 -  1.030 Final   pH 12/11/2022 6.0  5.0 - 8.0 Final   Glucose, UA 12/11/2022 NEGATIVE  NEGATIVE mg/dL Final   Hgb urine dipstick  12/11/2022 NEGATIVE  NEGATIVE Final   Bilirubin Urine 12/11/2022 NEGATIVE  NEGATIVE Final   Ketones, ur 12/11/2022 NEGATIVE  NEGATIVE mg/dL Final   Protein, ur 89/84/7975 NEGATIVE  NEGATIVE mg/dL Final   Nitrite 89/84/7975 NEGATIVE  NEGATIVE Final   Leukocytes,Ua 12/11/2022 TRACE (A)  NEGATIVE Final   RBC / HPF 12/11/2022 0-5  0 - 5 RBC/hpf Final   WBC, UA 12/11/2022 0-5  0 - 5 WBC/hpf Final   Bacteria, UA 12/11/2022 NONE SEEN  NONE SEEN Final   Squamous Epithelial / HPF 12/11/2022 6-10  0 - 5 /HPF Final  Office Visit on 12/10/2022  Component Date Value Ref Range Status   Cholesterol 12/10/2022 193  0 - 200 mg/dL Final   Triglycerides 89/85/7975 96.0  0.0 - 149.0 mg/dL Final   HDL 89/85/7975 55.40  >39.00 mg/dL Final   VLDL 89/85/7975 19.2  0.0 - 40.0 mg/dL Final   LDL Cholesterol 12/10/2022 118 (H)  0 - 99 mg/dL Final   Total CHOL/HDL Ratio 12/10/2022 3   Final   NonHDL 12/10/2022 137.17   Final   Sodium 12/10/2022 142  135 - 145 mEq/L Final   Potassium 12/10/2022 3.8  3.5 - 5.1 mEq/L Final   Chloride 12/10/2022 108  96 - 112 mEq/L Final   CO2 12/10/2022 26  19 - 32 mEq/L Final   Glucose, Bld 12/10/2022 84  70 - 99 mg/dL Final   BUN 89/85/7975 17  6 - 23 mg/dL Final   Creatinine, Ser 12/10/2022 0.88  0.40 - 1.20 mg/dL Final   Total Bilirubin 12/10/2022 0.5  0.2 - 1.2 mg/dL Final   Alkaline Phosphatase 12/10/2022 112  39 - 117 U/L Final   AST 12/10/2022 18  0 - 37 U/L Final   ALT 12/10/2022 13  0 - 35 U/L Final   Total Protein 12/10/2022 7.4  6.0 - 8.3 g/dL Final   Albumin 89/85/7975 4.3  3.5 - 5.2 g/dL Final   GFR 89/85/7975 63.14  >60.00 mL/min Final   Calcium  12/10/2022 9.4  8.4 - 10.5 mg/dL Final   WBC 89/85/7975 5.5  4.0 - 10.5 K/uL Final   RBC 12/10/2022 4.35  3.87 - 5.11 Mil/uL Final   Hemoglobin 12/10/2022 13.2  12.0 - 15.0 g/dL Final   HCT 89/85/7975 40.3  36.0 - 46.0 % Final   MCV 12/10/2022 92.7  78.0 - 100.0 fl Final   MCHC 12/10/2022 32.8  30.0 - 36.0 g/dL Final    RDW 89/85/7975 14.0  11.5 - 15.5 % Final   Platelets 12/10/2022 278.0  150.0 - 400.0 K/uL Final   Neutrophils Relative % 12/10/2022 49.4  43.0 - 77.0 % Final   Lymphocytes Relative 12/10/2022 38.7  12.0 - 46.0 % Final   Monocytes Relative 12/10/2022 7.8  3.0 - 12.0 % Final   Eosinophils Relative 12/10/2022 2.8  0.0 - 5.0 % Final   Basophils Relative 12/10/2022 1.3  0.0 - 3.0 % Final   Neutro Abs 12/10/2022 2.7  1.4 - 7.7 K/uL Final   Lymphs Abs 12/10/2022 2.1  0.7 - 4.0 K/uL Final   Monocytes Absolute 12/10/2022 0.4  0.1 - 1.0 K/uL Final   Eosinophils Absolute 12/10/2022 0.2  0.0 - 0.7 K/uL Final   Basophils Absolute 12/10/2022 0.1  0.0 - 0.1 K/uL Final   MICRO NUMBER: 12/10/2022 [id number]  Final   SPECIMEN QUALITY: 12/10/2022 Adequate   Final   Source 12/10/2022 NOT GIVEN   Final   STATUS: 12/10/2022 FINAL   Final   ISOLATE 1: 12/10/2022 Staphylococcus hominis (AA)   Final   COMMENT: 12/10/2022 Aerobic and anaerobic bottle received.   Final   Sed Rate 12/10/2022 30  0 - 30 mm/hr Final   CRP 12/10/2022 <1.0  0.5 - 20.0 mg/dL Final   Total CK 89/85/7975 108  7 - 177 U/L Final   Total Protein 12/10/2022 7.0  6.1 - 8.1 g/dL Final   Albumin ELP 89/85/7975 4.2  3.8 - 4.8 g/dL Final   Alpha 1 89/85/7975 0.3  0.2 - 0.3 g/dL Final   Alpha 2 89/85/7975 0.6  0.5 - 0.9 g/dL Final   Beta Globulin 89/85/7975 0.4  0.4 - 0.6 g/dL Final   Beta 2 89/85/7975 0.4  0.2 - 0.5 g/dL Final   Gamma Globulin 12/10/2022 1.0  0.8 - 1.7 g/dL Final   SPE Interp. 12/10/2022    Final   Uric Acid, Serum 12/10/2022 5.6  2.4 - 7.0 mg/dL Final   Cyclic Citrullin Peptide Ab 89/85/7975 <16  UNITS Final   Rheumatoid fact SerPl-aCnc 12/10/2022 20 (H)  <14 IU/mL Final  Office Visit on 09/24/2022  Component Date Value Ref Range Status   Ferritin 09/24/2022 50.1  10.0 - 291.0 ng/mL Final   Iron 09/24/2022 88  42 - 145 ug/dL Final   Transferrin 92/70/7975 203.0 (L)  212.0 - 360.0 mg/dL Final   Saturation Ratios 09/24/2022  31.0  20.0 - 50.0 % Final   TIBC 09/24/2022 284.2  250.0 - 450.0 mcg/dL Final   VITD 92/70/7975 21.55 (L)  30.00 - 100.00 ng/mL Final   Vitamin B-12 09/24/2022 1,354 (H)  211 - 911 pg/mL Final   Sodium 09/24/2022 142  135 - 145 mEq/L Final   Potassium 09/24/2022 3.6  3.5 - 5.1 mEq/L Final   Chloride 09/24/2022 107  96 - 112 mEq/L Final   CO2 09/24/2022 28  19 - 32 mEq/L Final   Glucose, Bld 09/24/2022 87  70 - 99 mg/dL Final   BUN 92/70/7975 15  6 - 23 mg/dL Final   Creatinine, Ser 09/24/2022 0.92  0.40 - 1.20 mg/dL Final   GFR 92/70/7975 59.95 (L)  >60.00 mL/min Final   Calcium  09/24/2022 9.2  8.4 - 10.5 mg/dL Final   Color, Urine 92/70/7975 YELLOW  Yellow;Lt. Yellow;Straw;Dark Yellow;Amber;Green;Red;Brown Final   APPearance 09/24/2022 CLEAR  Clear;Turbid;Slightly Cloudy;Cloudy Final   Specific Gravity, Urine 09/24/2022 1.010  1.000 - 1.030 Final   pH 09/24/2022 7.0  5.0 - 8.0 Final   Total Protein, Urine 09/24/2022 NEGATIVE  Negative Final   Urine Glucose 09/24/2022 NEGATIVE  Negative Final   Ketones, ur 09/24/2022 NEGATIVE  Negative Final   Bilirubin Urine 09/24/2022 NEGATIVE  Negative Final   Hgb urine dipstick 09/24/2022 NEGATIVE  Negative Final   Urobilinogen, UA 09/24/2022 0.2  0.0 - 1.0 Final   Leukocytes,Ua 09/24/2022 SMALL (A)  Negative Final   Nitrite 09/24/2022 NEGATIVE  Negative Final   WBC, UA 09/24/2022 3-6/hpf (A)  0-2/hpf Final   RBC / HPF 09/24/2022 none seen  0-2/hpf Final   Squamous Epithelial / HPF 09/24/2022 Rare(0-4/hpf)  Rare(0-4/hpf) Final   TSH 09/24/2022 2.07  0.35 - 5.50 uIU/mL Final   WBC 09/24/2022 4.8  4.0 - 10.5 K/uL Final   RBC 09/24/2022 4.23  3.87 - 5.11 Mil/uL Final   Hemoglobin 09/24/2022 13.0  12.0 - 15.0  g/dL Final   HCT 92/70/7975 39.8  36.0 - 46.0 % Final   MCV 09/24/2022 94.2  78.0 - 100.0 fl Final   MCHC 09/24/2022 32.6  30.0 - 36.0 g/dL Final   RDW 92/70/7975 14.3  11.5 - 15.5 % Final   Platelets 09/24/2022 237.0  150.0 - 400.0 K/uL  Final   Neutrophils Relative % 09/24/2022 40.9 (L)  43.0 - 77.0 % Final   Lymphocytes Relative 09/24/2022 44.4  12.0 - 46.0 % Final   Monocytes Relative 09/24/2022 9.9  3.0 - 12.0 % Final   Eosinophils Relative 09/24/2022 3.9  0.0 - 5.0 % Final   Basophils Relative 09/24/2022 0.9  0.0 - 3.0 % Final   Neutro Abs 09/24/2022 2.0  1.4 - 7.7 K/uL Final   Lymphs Abs 09/24/2022 2.2  0.7 - 4.0 K/uL Final   Monocytes Absolute 09/24/2022 0.5  0.1 - 1.0 K/uL Final   Eosinophils Absolute 09/24/2022 0.2  0.0 - 0.7 K/uL Final   Basophils Absolute 09/24/2022 0.0  0.0 - 0.1 K/uL Final   Total Bilirubin 09/24/2022 0.5  0.2 - 1.2 mg/dL Final   Bilirubin, Direct 09/24/2022 0.1  0.0 - 0.3 mg/dL Final   Alkaline Phosphatase 09/24/2022 106  39 - 117 U/L Final   AST 09/24/2022 18  0 - 37 U/L Final   ALT 09/24/2022 14  0 - 35 U/L Final   Total Protein 09/24/2022 6.8  6.0 - 8.3 g/dL Final   Albumin 92/70/7975 4.1  3.5 - 5.2 g/dL Final   Cholesterol 92/70/7975 174  0 - 200 mg/dL Final   Triglycerides 92/70/7975 72.0  0.0 - 149.0 mg/dL Final   HDL 92/70/7975 60.30  >39.00 mg/dL Final   VLDL 92/70/7975 14.4  0.0 - 40.0 mg/dL Final   LDL Cholesterol 09/24/2022 99  0 - 99 mg/dL Final   Total CHOL/HDL Ratio 09/24/2022 3   Final   NonHDL 09/24/2022 113.20   Final   Hgb A1c MFr Bld 09/24/2022 5.9  4.6 - 6.5 % Final  There may be more visits with results that are not included.  No image results found. DG Cervical Spine Complete Result Date: 02/21/2024 EXAM: 6 VIEW(S) XRAY OF THE CERVICAL SPINE 02/18/2024 11:40:27 AM COMPARISON: Cervical spine radiographs 07/19/2008. CLINICAL HISTORY: neck pain and stiffness FINDINGS: BONES: Vertebral body heights are maintained. Alignment is normal. Unchanged 3 mm retrolisthesis of C3 on C4. DISCS AND DEGENERATIVE CHANGES: Interval progression of disc height loss at C3-C4 and C5-C6. Multilevel cervical facet arthropathy with moderate bilateral neural foraminal narrowing at C5-C6 and  severe right neural foraminal narrowing at C3-C4. SOFT TISSUES: No prevertebral soft tissue swelling. The visualized lungs appear clear. IMPRESSION: 1. Progression of disc height loss at C3-4 and C5-6 since 2010. 2. Multilevel cervical facet arthropathy with severe right neural foraminal narrowing at C3-4. Electronically signed by: Alaisha Eversley Chess MD 02/21/2024 04:29 PM EST RP Workstation: [id number]   MR BRAIN W WO CONTRAST Result Date: 02/21/2024 EXAM: MRI BRAIN WITH AND WITHOUT CONTRAST 02/21/2024 04:07:42 PM TECHNIQUE: Multiplanar multisequence MRI of the head/brain was performed with and without the administration of intravenous contrast. 8 mL (gadobutrol  (GADAVIST ) 1 MMOL/ML injection 8 mL GADOBUTROL  1 MMOL/ML IV SOLN) was administered. COMPARISON: None available. CLINICAL HISTORY: Headache, new onset (Age >= 51y) FINDINGS: BRAIN AND VENTRICLES: No acute infarct. No acute intracranial hemorrhage. No mass effect or midline shift. No hydrocephalus. The sella is unremarkable. Normal flow voids. No mass or abnormal enhancement. Scattered foci of T2 hyperintensity in the cerebral  white matter, most consistent with mild chronic small vessel disease. ORBITS: No acute abnormality. SINUSES: No acute abnormality. BONES AND SOFT TISSUES: Normal bone marrow signal and enhancement. No acute soft tissue abnormality. IMPRESSION: 1. No acute intracranial abnormality or mass. No specific findings to explain headaches. Electronically signed by: Baelyn Doring Chess MD 02/21/2024 04:24 PM EST RP Workstation: [id number]         ASSESSMENT & PLAN   Assessment & Plan Migraine aura, persistent, with status migrainosus Chronic migraines with persistent aura are likely exacerbated by stress and tension. A previous MRI was unremarkable. Nurtec was prescribed but not obtained due to insurance issues, which suggested alternatives like triptans that are contraindicated due to cardiovascular risks. Propranolol  is considered for its  dual role in migraine prevention and blood pressure management. An appeal for Nurtec and Ubrelvy  has been sent to insurance. Propranolol  is prescribed for migraine prevention and blood pressure management. She is advised to avoid triptans due to cardiovascular risks.   Request for Expedited Appeal: Medical Necessity & Safety Exception To: [facility] / [facility] [employer] Coverage Determinations Fax: [phone] Date: March 11, 2024  Requested Medication: Ubrelvy  (ubrogepant ) Diagnosis: Migraine with/without aura (G43.x), Coronary Artery Disease (I25.10) URGENT: EXPEDITED 24-HOUR REVIEW REQUESTED I certify that applying the standard 72-hour review timeframe may seriously jeopardize the life or health of the enrollee. Clinical Justification: This letter serves as a formal appeal for the coverage of Ubrelvy . This patient has a documented history of Coronary Artery Disease (CAD). The plan's requirement for a trial and failure of Triptans (e.g., Sumatriptan, Rizatriptan) is clinically inappropriate and life-threatening for this patient. Per FDA-approved labeling and the American Headache Society (AHS) guidelines, Triptans are strictly contraindicated in patients with ischemic heart disease or coronary artery vasospasm due to their vasoconstrictive mechanism of action. Contraindication: Triptans (Step 1 drugs) are contraindicated due to the patient's CAD. Safety: Prescribing a triptan to this patient would place them at immediate risk for myocardial infarction or cardiac arrhythmia. Medical Necessity: Ubrelvy  (a CGRP antagonist) does not cause vasoconstriction and is the standard of care for acute migraine treatment in patients with cardiovascular comorbidities. Requested Action: Please grant a Step Therapy Exception for Ubrelvy . The patient has suffered for months without adequate abortive therapy, and further delay in treatment constitutes a significant health risk. Stress  Coronary artery disease due  to lipid rich plaque  Essential hypertension Reviewed available data from patient and  BP Readings from Last 3 Encounters:  03/11/24 138/80  02/18/24 (!) 142/82  12/11/23 126/80   Blood pressure is borderline high. Current medications include losartan  and amlodipine . Propranolol  is introduced for its additional benefits in migraine and anxiety management. A potential reduction of amlodipine  is discussed if blood pressure becomes too low. She will continue losartan  and amlodipine , with propranolol  prescribed for blood pressure management. Blood pressure will be monitored, and medications adjusted as needed.  My individualized, goal average blood pressure for this patient, after considering the evidence for and against aggressive blood pressure goals as well as their past medical history and preferences, is 140/90 In my medical opinion, this problem is stable, borderline controlled  Increased medication(s) prescribed after collaborative review. Following informed consent, we adjusted the medication regimen as per documented orders to now be:  Current hypertension medications:       Sig   propranolol  ER (INDERAL  LA) 60 MG 24 hr capsule (Taking) Take 1 capsule (60 mg total) by mouth daily.   amLODipine  (NORVASC ) 10 MG tablet Take 1 tablet (10 mg total)  by mouth daily.   furosemide  (LASIX ) 20 MG tablet Take 1 tablet (20 mg total) by mouth daily as needed.   losartan  (COZAAR ) 25 MG tablet TAKE 1 TABLET (25 MG TOTAL) BY MOUTH DAILY.     We explained the expected benefits and potential side effects of the new medications and encouraged the patient to report any concerns. Information for patient review: Please limit and avoid: salt, alcohol, NSAIDS, excess body weight, smoking, stress, sedentary lifestyles The risks of poor control over time are FUTURE stroke and heart attacks, but if you have a blood pressure over 180 and any red flag symptoms(headache/shortness of breath/confusion/chest  discomfort) you should go to the ER.  You are encouraged to do home blood pressure monitoring, at least as many times per week as blood pressure medications you are on.  For example, bring a diary with 3 home blood pressure readings per week to each visit if you are on 3 blood pressure medications.   If you are on more than 3 medication(s) or have certain risk factors, we should do a resistant hypertension workup See AFTER VISIT SUMMARY for addition educational information provided Please let me know in advance when you need medication(s) refills, consistently taking your medication is very important!  Situational anxiety Anxiety and stress are likely related to family concerns and media exposure. Propranolol  is introduced for its anxiolytic effects, reducing stress-induced adrenaline spikes. Propranolol  is prescribed for anxiety management. Vitamin D  deficiency Managed with supplementation. She is advised to take vitamin D  with calcium  for optimal absorption. Vitamin D  supplementation with calcium  will continue. Hypokalemia Likely secondary to Lasix  use. Potassium supplementation is currently used. Dietary sources of potassium and potential discontinuation of Lasix  and potassium supplements are discussed if fluid retention is manageable. High potassium diet recommendations are provided, and discontinuation of Lasix  and potassium supplements will be considered if fluid retention is manageable. Morbid obesity (HCC) Diabetes mellitus without complication (HCC) Diabetes is well-controlled with Actos . A potential switch to Zepbound  or Rybelsus  is discussed for better wt management and reduced side effects. Insurance approval is pending for these medications. She will continue Actos  until insurance approval for Zepbound  or Rybelsus  and monitor blood glucose levels. Lab Results  Component Value Date   HGBA1C 6.1 12/11/2023   HGBA1C 6.0 08/20/2023   HGBA1C 6.2 03/27/2023   HGBA1C 5.9 09/24/2022    HGBA1C 6.0 01/11/2022   HGBA1C 5.6 07/11/2021   HGBA1C 5.8 01/11/2021   HGBA1C 5.9 07/12/2020   HGBA1C 5.9 01/04/2020   HGBA1C 5.9 07/08/2019   HGBA1C 5.9 01/06/2019   HGBA1C 5.9 07/03/2018   HGBA1C 5.8 12/31/2017   HGBA1C 5.9 06/25/2017   HGBA1C 5.9 12/25/2016   HGBA1C 5.9 06/15/2016   HGBA1C 5.9 12/16/2015   HGBA1C 6.0 07/01/2015   HGBA1C 5.8 06/08/2014   HGBA1C 6.0 12/04/2013   HGBA1C 5.9 09/18/2013   HGBA1C 6.2 06/04/2013   HGBA1C 6.1 06/04/2012   HGBA1C 6.5 12/05/2011   HGBA1C 6.5 06/28/2011   HGBA1C 6.7 (H) 04/17/2011   HGBA1C 6.4 11/21/2010   HGBA1C 7.7 (H) 05/23/2010   HGBA1C 7.6 (H) 12/06/2009   HGBA1C 6.6 (H) 02/09/2009     ORDER ASSOCIATIONS  #   DIAGNOSIS / CONDITION ICD-10 ENCOUNTER ORDER     ICD-10-CM   1. Migraine aura, persistent, with status migrainosus  G43.501 Ubrogepant  (UBRELVY ) 50 MG TABS    propranolol  ER (INDERAL  LA) 60 MG 24 hr capsule    2. Stress  F43.9 propranolol  ER (INDERAL  LA) 60 MG 24  hr capsule    3. Coronary artery disease due to lipid rich plaque  I25.10 propranolol  ER (INDERAL  LA) 60 MG 24 hr capsule   I25.83     4. Essential hypertension  I10 propranolol  ER (INDERAL  LA) 60 MG 24 hr capsule    5. Situational anxiety  F41.8 propranolol  ER (INDERAL  LA) 60 MG 24 hr capsule    6. Vitamin D  deficiency  E55.9     7. Hypokalemia  E87.6 Evolocumab  SOAJ 140 mg    8. Diabetes mellitus without complication (HCC)  E11.9 tirzepatide  (ZEPBOUND ) 2.5 MG/0.5ML Pen    Semaglutide  (RYBELSUS ) 3 MG TABS    9. Morbid obesity (HCC)  E66.01            Orders Placed in Encounter:   Meds ordered this encounter  Medications   Ubrogepant  (UBRELVY ) 50 MG TABS    Sig: Primary Dose: Take 1 tablet (50 mg) by mouth at the onset of a migraine attack. Optional Second Dose: If migraine symptoms persist or recur, a second tablet may be taken at least 2 hours after the first dose. Maximum Dose: Do not exceed 200 mg (i.e., follow maximum daily limits as per  current guidelines) within a 24-hour period. Additional Note: This medication is for acute treatment only and is not intended for migraine prevention.    Dispense:  12 tablet    Refill:  11    co-administration with strong CYP3A4 inhibitors (e.g., ketoconazole, itraconazole, clarithromycin, ritonavir) is generally contraindicated.  Avoid if signs of liver failure.   propranolol  ER (INDERAL  LA) 60 MG 24 hr capsule    Sig: Take 1 capsule (60 mg total) by mouth daily.    Dispense:  90 capsule    Refill:  3   tirzepatide  (ZEPBOUND ) 2.5 MG/0.5ML Pen    Sig: Inject 2.5 mg into the skin once a week.    Dispense:  2 mL    Refill:  11   Semaglutide  (RYBELSUS ) 3 MG TABS    Sig: Take 1 tablet (3 mg total) by mouth daily.    Dispense:  30 tablet    Refill:  2    Preferred over Zepbound  if approved.  Replaces actos  which is causing leg swelling and she can't tolerate metformin    Evolocumab  (REPATHA  SURECLICK) 140 MG/ML SOAJ    Sig: Inject 140 mg into the skin every 14 (fourteen) days.    Dispense:  2 mL    Refill:  2   Evolocumab  SOAJ 140 mg        This document was synthesized by artificial intelligence (Abridge) using HIPAA-compliant recording of the clinical interaction;   We discussed the use of AI scribe software for clinical note transcription with the patient, who gave verbal consent to proceed. additional Info: This encounter employed state-of-the-art, real-time, collaborative documentation. The patient actively reviewed and assisted in updating their electronic medical record on a shared screen, ensuring transparency and facilitating joint problem-solving for the problem list, overview, and plan. This approach promotes accurate, informed care. The treatment plan was discussed and reviewed in detail, including medication safety, potential side effects, and all patient questions. We confirmed understanding and comfort with the plan. Follow-up instructions were established, including contacting  the office for any concerns, returning if symptoms worsen, persist, or new symptoms develop, and precautions for potential emergency department visits.

## 2024-03-11 NOTE — Assessment & Plan Note (Addendum)
 Chronic migraines with persistent aura are likely exacerbated by stress and tension. A previous MRI was unremarkable. Nurtec was prescribed but not obtained due to insurance issues, which suggested alternatives like triptans that are contraindicated due to cardiovascular risks. Propranolol  is considered for its dual role in migraine prevention and blood pressure management. An appeal for Nurtec and Ubrelvy  has been sent to insurance. Propranolol  is prescribed for migraine prevention and blood pressure management. She is advised to avoid triptans due to cardiovascular risks.   Request for Expedited Appeal: Medical Necessity & Safety Exception To: [facility] / [employer] [employer] Coverage Determinations Fax: [phone] Date: March 11, 2024  Requested Medication: Ubrelvy  (ubrogepant ) Diagnosis: Migraine with/without aura (G43.x), Coronary Artery Disease (I25.10) URGENT: EXPEDITED 24-HOUR REVIEW REQUESTED I certify that applying the standard 72-hour review timeframe may seriously jeopardize the life or health of the enrollee. Clinical Justification: This letter serves as a formal appeal for the coverage of Ubrelvy . This patient has a documented history of Coronary Artery Disease (CAD). The plan's requirement for a trial and failure of Triptans (e.g., Sumatriptan, Rizatriptan) is clinically inappropriate and life-threatening for this patient. Per FDA-approved labeling and the American Headache Society (AHS) guidelines, Triptans are strictly contraindicated in patients with ischemic heart disease or coronary artery vasospasm due to their vasoconstrictive mechanism of action. Contraindication: Triptans (Step 1 drugs) are contraindicated due to the patient's CAD. Safety: Prescribing a triptan to this patient would place them at immediate risk for myocardial infarction or cardiac arrhythmia. Medical Necessity: Ubrelvy  (a CGRP antagonist) does not cause vasoconstriction and is the standard of care for acute  migraine treatment in patients with cardiovascular comorbidities. Requested Action: Please grant a Step Therapy Exception for Ubrelvy . The patient has suffered for months without adequate abortive therapy, and further delay in treatment constitutes a significant health risk.

## 2024-03-11 NOTE — Assessment & Plan Note (Signed)
 Anxiety and stress are likely related to family concerns and media exposure. Propranolol  is introduced for its anxiolytic effects, reducing stress-induced adrenaline spikes. Propranolol  is prescribed for anxiety management.

## 2024-03-16 ENCOUNTER — Telehealth: Payer: Self-pay

## 2024-03-16 ENCOUNTER — Other Ambulatory Visit (HOSPITAL_COMMUNITY): Payer: Self-pay

## 2024-03-16 NOTE — Telephone Encounter (Signed)
 Pharmacy Patient Advocate Encounter   Received notification from [facility] KEY that prior authorization for Ubrelvy  50MG  tablets is required/requested.   Insurance verification completed.   The patient is insured through [facility].   Per test claim: PA required; PA submitted to above mentioned insurance via Latent Key/confirmation #/EOC [id number] Status is pending

## 2024-03-16 NOTE — Telephone Encounter (Signed)
 Pharmacy Patient Advocate Encounter  Received notification from [facility] that Prior Authorization for UBRELVY   has been APPROVED from 02/27/24 to 03/16/25   PA #/Case ID/Reference #:   [id number]

## 2024-03-19 ENCOUNTER — Ambulatory Visit: Payer: 59

## 2024-03-20 ENCOUNTER — Ambulatory Visit

## 2024-03-23 ENCOUNTER — Ambulatory Visit: Admitting: Internal Medicine

## 2024-03-23 ENCOUNTER — Other Ambulatory Visit: Payer: Self-pay | Admitting: Internal Medicine

## 2024-03-23 DIAGNOSIS — E119 Type 2 diabetes mellitus without complications: Secondary | ICD-10-CM

## 2024-03-23 MED ORDER — ACCU-CHEK GUIDE ME W/DEVICE KIT: PACK | 0 refills | Status: AC

## 2024-03-23 MED ORDER — LANCETS MISC: 3 refills | Status: AC

## 2024-03-23 MED ORDER — ACCU-CHEK GUIDE TEST VI STRP: ORAL_STRIP | 12 refills | Status: AC

## 2024-04-01 ENCOUNTER — Telehealth: Payer: Self-pay

## 2024-04-01 ENCOUNTER — Other Ambulatory Visit: Payer: Self-pay

## 2024-04-01 MED ORDER — ALBUTEROL SULFATE HFA 108 (90 BASE) MCG/ACT IN AERS: INHALATION_SPRAY | RESPIRATORY_TRACT | 5 refills | Status: AC

## 2024-04-01 NOTE — Telephone Encounter (Signed)
 Copied from CRM [id number]. Topic: General - Other >> Apr 01, 2024 12:36 PM [place] J wrote: Reason for CRM: [place] calling on behalf of the patient with [facility], wanting to let Dr. Jesus know that moving forward, the patient is wanting her Blood Glucose Monitoring Suppl (ACCU-CHEK GUIDE ME) w/Device KIT sent to her in=person pharmacy not mail order. I was able to verify the patient's current pharmacy on file with [facility] as well.

## 2024-04-03 ENCOUNTER — Other Ambulatory Visit: Payer: Self-pay

## 2024-04-03 ENCOUNTER — Other Ambulatory Visit: Payer: Self-pay | Admitting: Internal Medicine

## 2024-04-29 ENCOUNTER — Ambulatory Visit: Admitting: Dermatology

## 2024-06-09 ENCOUNTER — Ambulatory Visit: Admitting: Internal Medicine

## 2024-06-10 ENCOUNTER — Ambulatory Visit: Admitting: Internal Medicine
